# Patient Record
Sex: Female | Born: 1935 | Race: White | Hispanic: No | Smoking: Never smoker
Health system: Southern US, Community
[De-identification: ages and names within clinical notes are randomized; demographics above are authoritative.]

## PROBLEM LIST (undated history)

## (undated) DIAGNOSIS — R0602 Shortness of breath: Secondary | ICD-10-CM

## (undated) DIAGNOSIS — J302 Other seasonal allergic rhinitis: Secondary | ICD-10-CM

## (undated) DIAGNOSIS — Q210 Ventricular septal defect: Secondary | ICD-10-CM

## (undated) DIAGNOSIS — M179 Osteoarthritis of knee, unspecified: Secondary | ICD-10-CM

## (undated) DIAGNOSIS — E039 Hypothyroidism, unspecified: Secondary | ICD-10-CM

## (undated) DIAGNOSIS — C801 Malignant (primary) neoplasm, unspecified: Secondary | ICD-10-CM

## (undated) DIAGNOSIS — Z923 Personal history of irradiation: Secondary | ICD-10-CM

## (undated) DIAGNOSIS — I2510 Atherosclerotic heart disease of native coronary artery without angina pectoris: Secondary | ICD-10-CM

## (undated) DIAGNOSIS — I255 Ischemic cardiomyopathy: Secondary | ICD-10-CM

## (undated) DIAGNOSIS — M1710 Unilateral primary osteoarthritis, unspecified knee: Secondary | ICD-10-CM

## (undated) DIAGNOSIS — I2109 ST elevation (STEMI) myocardial infarction involving other coronary artery of anterior wall: Secondary | ICD-10-CM

## (undated) DIAGNOSIS — I10 Essential (primary) hypertension: Secondary | ICD-10-CM

## (undated) DIAGNOSIS — K649 Unspecified hemorrhoids: Secondary | ICD-10-CM

## (undated) HISTORY — DX: Malignant (primary) neoplasm, unspecified: C80.1

## (undated) HISTORY — PX: JOINT REPLACEMENT: SHX530

## (undated) HISTORY — DX: Hypothyroidism, unspecified: E03.9

## (undated) HISTORY — PX: TOTAL KNEE ARTHROPLASTY: SHX125

## (undated) HISTORY — DX: Ischemic cardiomyopathy: I25.5

## (undated) HISTORY — DX: Personal history of irradiation: Z92.3

## (undated) HISTORY — PX: EYE SURGERY: SHX253

## (undated) HISTORY — PX: CATARACT EXTRACTION, BILATERAL: SHX1313

## (undated) HISTORY — PX: HEMORRHOID SURGERY: SHX153

## (undated) HISTORY — PX: ABDOMINAL HYSTERECTOMY: SHX81

## (undated) HISTORY — PX: TONSILLECTOMY: SUR1361

---

## 1997-11-18 ENCOUNTER — Other Ambulatory Visit: Admission: RE | Admit: 1997-11-18 | Discharge: 1997-11-18 | Payer: Self-pay | Admitting: Gynecology

## 1999-07-25 ENCOUNTER — Other Ambulatory Visit: Admission: RE | Admit: 1999-07-25 | Discharge: 1999-07-25 | Payer: Self-pay | Admitting: Obstetrics and Gynecology

## 2000-04-19 ENCOUNTER — Encounter: Admission: RE | Admit: 2000-04-19 | Discharge: 2000-04-19 | Payer: Self-pay | Admitting: Internal Medicine

## 2000-04-19 ENCOUNTER — Encounter: Payer: Self-pay | Admitting: Internal Medicine

## 2000-08-27 ENCOUNTER — Ambulatory Visit (HOSPITAL_COMMUNITY): Admission: RE | Admit: 2000-08-27 | Discharge: 2000-08-27 | Payer: Self-pay | Admitting: *Deleted

## 2000-09-12 ENCOUNTER — Other Ambulatory Visit: Admission: RE | Admit: 2000-09-12 | Discharge: 2000-09-12 | Payer: Self-pay | Admitting: Gynecology

## 2001-12-12 ENCOUNTER — Other Ambulatory Visit: Admission: RE | Admit: 2001-12-12 | Discharge: 2001-12-12 | Payer: Self-pay | Admitting: Gynecology

## 2001-12-25 ENCOUNTER — Observation Stay (HOSPITAL_COMMUNITY): Admission: RE | Admit: 2001-12-25 | Discharge: 2001-12-26 | Payer: Self-pay | Admitting: Gynecology

## 2002-04-24 ENCOUNTER — Observation Stay (HOSPITAL_COMMUNITY): Admission: RE | Admit: 2002-04-24 | Discharge: 2002-04-25 | Payer: Self-pay | Admitting: Urology

## 2003-02-11 ENCOUNTER — Other Ambulatory Visit: Admission: RE | Admit: 2003-02-11 | Discharge: 2003-02-11 | Payer: Self-pay | Admitting: Gynecology

## 2004-05-05 ENCOUNTER — Encounter: Admission: RE | Admit: 2004-05-05 | Discharge: 2004-05-05 | Payer: Self-pay | Admitting: Specialist

## 2004-06-02 ENCOUNTER — Inpatient Hospital Stay (HOSPITAL_COMMUNITY): Admission: RE | Admit: 2004-06-02 | Discharge: 2004-06-08 | Payer: Self-pay | Admitting: Specialist

## 2004-06-02 ENCOUNTER — Ambulatory Visit: Payer: Self-pay | Admitting: Physical Medicine & Rehabilitation

## 2005-01-24 ENCOUNTER — Other Ambulatory Visit: Admission: RE | Admit: 2005-01-24 | Discharge: 2005-01-24 | Payer: Self-pay | Admitting: Gynecology

## 2008-02-11 ENCOUNTER — Encounter: Payer: Self-pay | Admitting: Gynecology

## 2008-02-11 ENCOUNTER — Ambulatory Visit: Payer: Self-pay | Admitting: Gynecology

## 2008-02-11 ENCOUNTER — Other Ambulatory Visit: Admission: RE | Admit: 2008-02-11 | Discharge: 2008-02-11 | Payer: Self-pay | Admitting: Gynecology

## 2009-03-17 ENCOUNTER — Encounter: Admission: RE | Admit: 2009-03-17 | Discharge: 2009-03-17 | Payer: Self-pay | Admitting: Specialist

## 2010-03-24 ENCOUNTER — Ambulatory Visit: Payer: Self-pay | Admitting: Gynecology

## 2010-10-18 ENCOUNTER — Inpatient Hospital Stay (HOSPITAL_COMMUNITY)
Admission: RE | Admit: 2010-10-18 | Discharge: 2010-10-24 | DRG: 470 | Disposition: A | Discharge status: Skilled Nursing Facility | Payer: Medicare HMO | Source: Ambulatory Visit | Attending: Orthopedic Surgery | Admitting: Orthopedic Surgery

## 2010-10-18 ENCOUNTER — Ambulatory Visit (HOSPITAL_COMMUNITY)
Admission: RE | Admit: 2010-10-18 | Discharge: 2010-10-18 | Disposition: A | Discharge status: Home / Self Care | Payer: Medicare HMO | Source: Ambulatory Visit | Attending: Orthopedic Surgery | Admitting: Orthopedic Surgery

## 2010-10-18 ENCOUNTER — Other Ambulatory Visit (HOSPITAL_COMMUNITY): Payer: Self-pay | Admitting: Orthopedic Surgery

## 2010-10-18 DIAGNOSIS — I10 Essential (primary) hypertension: Secondary | ICD-10-CM | POA: Diagnosis present

## 2010-10-18 DIAGNOSIS — M1710 Unilateral primary osteoarthritis, unspecified knee: Principal | ICD-10-CM | POA: Diagnosis present

## 2010-10-18 DIAGNOSIS — Z01811 Encounter for preprocedural respiratory examination: Secondary | ICD-10-CM

## 2010-10-18 LAB — DIFFERENTIAL
Basophils Absolute: 0.1 10*3/uL (ref 0.0–0.1)
Basophils Relative: 1 % (ref 0–1)
Eosinophils Relative: 4 % (ref 0–5)
Lymphocytes Relative: 19 % (ref 12–46)
Neutro Abs: 6.6 10*3/uL (ref 1.7–7.7)

## 2010-10-18 LAB — BASIC METABOLIC PANEL
BUN: 19 mg/dL (ref 6–23)
Calcium: 9.6 mg/dL (ref 8.4–10.5)
Creatinine, Ser: 0.82 mg/dL (ref 0.4–1.2)
GFR calc non Af Amer: >60 mL/min (ref >60)
Glucose, Bld: 99 mg/dL (ref 70–99)
Potassium: 3.8 mEq/L (ref 3.5–5.1)

## 2010-10-18 LAB — CBC
HCT: 40.1 % (ref 36.0–46.0)
Platelets: 292 10*3/uL (ref 150–400)
RDW: 13.9 % (ref 11.5–15.5)
WBC: 9.6 10*3/uL (ref 4.0–10.5)

## 2010-10-18 LAB — PROTIME-INR: Prothrombin Time: 13.2 seconds (ref 11.6–15.2)

## 2010-10-18 LAB — TYPE AND SCREEN
ABO/RH(D): A POS
Antibody Screen: NEGATIVE

## 2010-10-19 LAB — BASIC METABOLIC PANEL
BUN: 13 mg/dL (ref 6–23)
CO2: 28 mEq/L (ref 19–32)
Chloride: 103 mEq/L (ref 96–112)
Creatinine, Ser: 0.73 mg/dL (ref 0.4–1.2)
Potassium: 3.6 mEq/L (ref 3.5–5.1)

## 2010-10-19 LAB — CBC
HCT: 34.1 % — ABNORMAL LOW (ref 36.0–46.0)
Hemoglobin: 11.3 g/dL — ABNORMAL LOW (ref 12.0–15.0)
MCH: 27.7 pg (ref 26.0–34.0)
MCV: 83.6 fL (ref 78.0–100.0)
RBC: 4.08 MIL/uL (ref 3.87–5.11)
WBC: 11 10*3/uL — ABNORMAL HIGH (ref 4.0–10.5)

## 2010-10-20 LAB — CBC
HCT: 34.1 % — ABNORMAL LOW (ref 36.0–46.0)
MCH: 27.5 pg (ref 26.0–34.0)
MCV: 82.2 fL (ref 78.0–100.0)
Platelets: 203 10*3/uL (ref 150–400)
RDW: 13.9 % (ref 11.5–15.5)
WBC: 12.8 10*3/uL — ABNORMAL HIGH (ref 4.0–10.5)

## 2010-10-20 LAB — BASIC METABOLIC PANEL
BUN: 10 mg/dL (ref 6–23)
Chloride: 97 mEq/L (ref 96–112)
Creatinine, Ser: 0.72 mg/dL (ref 0.4–1.2)
GFR calc non Af Amer: >60 mL/min (ref >60)
Glucose, Bld: 142 mg/dL — ABNORMAL HIGH (ref 70–99)
Potassium: 3.4 mEq/L — ABNORMAL LOW (ref 3.5–5.1)

## 2010-10-20 LAB — URINALYSIS, ROUTINE W REFLEX MICROSCOPIC
Bilirubin Urine: NEGATIVE
Glucose, UA: NEGATIVE mg/dL
Ketones, ur: NEGATIVE mg/dL
Protein, ur: NEGATIVE mg/dL
pH: 6 (ref 5.0–8.0)

## 2010-10-21 LAB — URINE CULTURE
Colony Count: NO GROWTH
Culture  Setup Time: 201205180228

## 2010-10-21 NOTE — H&P (Signed)
[name], PLA               ACCOUNT NO.:  [id number]   MEDICAL RECORD NO.:  [id number]          PATIENT TYPE:  INP   LOCATION:  NA                           FACILITY:  [facility]   PHYSICIAN:  Erasmo Leventhal, M.D.DATE OF BIRTH:  Feb 17, 1925   DATE OF ADMISSION:  DATE OF DISCHARGE:                                HISTORY & PHYSICAL   SCHEDULED DATE OF SURGERY:  June 02, 2004   CHIEF COMPLAINT:  Left knee end-stage osteoarthritis.   HISTORY OF PRESENT ILLNESS:  This is a 77 year old female well known to [name]  who has been treated conservatively for a left knee osteoarthritis.  She has  now failed conservative management to control her pain.  After discussion of  treatment options, risks, benefits, and after-care, the patient is to  proceed with total knee arthroplasty of her left knee.  Her medical doctor  is Dr. Higinio Plan and she has been cleared for surgery by him.  The  surgery will go ahead as scheduled.   PAST MEDICAL HISTORY:  1.  Drug allergies: SYNVISC.  2.  Current medications:      1.  Lisinopril one q.a.m.      2.  Vicodin p.r.n.  3.  Previous surgery:  Bladder tack.  4.  Serious medical illnesses:  Hypertension.   FAMILY HISTORY:  Negative.   SOCIAL HISTORY:  The patient is married.  She is retired.  She lives at home  with her husband.  She does not smoke or drink.   REVIEW OF SYMPTOMS:  NEUROLOGIC:  Negative for headache, blurry vision, and  dizziness.  PULMONARY:  Negative for shortness of breath, PND, orthopnea.  CARDIOVASCULAR:  Positive for hypertension.  Negative for chest pain,  palpitations.  GI:  Negative for ulcers or hepatitis.  GU:  Positive for  history of cystitis.  MUSCULOSKELETAL:  Positive as in HPI.   PHYSICAL EXAMINATION:  VITAL SIGNS:  Blood pressure 148/62, respirations 16,  pulse 68 and regular.  GENERAL APPEARANCE:  Well-developed, well-nourished lady in no acute  distress.  HEENT:  Head normocephalic, nose patent, nares  patent.  Pupils equally  round, reactive to light.  Throat without injection.  NECK:  Supple without adenopathy.  Carotids 2+ without bruit.  CHEST:  Clear to auscultation, no rales or rhonchi, respirations 16.  HEART:  Regular rate and rhythm at 68 beats per minute without murmur.  ABDOMEN:  Soft with active bowel sounds, no mass or organomegaly.  NEUROLOGIC:  The patient alert and oriented to time, place, and person.  Cranial nerves 2-13 grossly intact.  EXTREMITIES:  Shows the left knee with a 5 degree flexion contracture with  further flexion to 95 degrees.  Sensation and circulation are intact.  Dorsalis pedis and posterior tibialis pulses are 1+.   X-rays show end-stage osteoarthritis left knee.   IMPRESSION:  End-stage osteoarthritis, left knee.   PLAN:  Total knee arthroplasty, left knee.      SJC/MEDQ  D:  05/27/2004  T:  05/27/2004  Job:  [id number]

## 2010-10-21 NOTE — Op Note (Signed)
[facility]  Patient:    [name], [name] Visit Number: [id number] MRN: [id number]          Service Type: DSU Location: 9300 [id number] Attending Physician:  Merrily Pew Dictated by:   Nadyne Coombes Fontaine, M.D. Proc. Date: 12/25/01 Admit Date:  12/25/2001 Discharge Date: 12/26/2001                             Operative Report  PREOPERATIVE DIAGNOSES:       Pelvic relaxation, cystocele.  PROCEDURE:                    Anterior repair.  SURGEON:                      Timothy P. Fontaine, M.D.  ASSISTANT:                    Douglass Rivers, M.D.  ANESTHESIA:                   Spinal.  ESTIMATED BLOOD LOSS:         Minimal.  COMPLICATIONS:                None.  SPECIMEN:                     None.  FINDINGS:                     Second degree cystocele, mild cuff prolapse with traction, rectocele, bimanual without gross masses or abnormalities.  PROCEDURE:                    The patient was taken to the operating room. Underwent spinal anesthesia.  Was placed in a dorsal lithotomy position. Received a perineal and vaginal preparation with Betadine solution.  Bladder emptied with in-and-out Foley catheterization.  The patient was draped in the usual fashion.  After assuring adequate anesthesia, the upper vaginal cuff was identified, a weighted speculum placed, and the upper cuff grasped with Allis forceps.  A transverse mucosal incision was made through sharp dissection. The vesicovaginal plane was developed in a linear fashion to a level below the urethral opening.  The dissection was then carried lateral in the vesicovaginal plane through sharp and blunt dissection.  The cystocele was then reduced through progressive embrocating interrupted 2-0 Vicryl sutures with good reduction of the cystocele.  The excess vaginal mucosa was then excised and subsequently the vaginal mucosa was reapproximated in a running interlocking stitch using 2-0  Vicryl suture incorporating the underlying tissues to obliterate the dead space.  A Foley catheter was placed.  Free flowing clear yellow urine was noted.  Vaginal packing was placed after removal of the speculum.  The patient was placed in a supine position and taken to the recovery room in good position having tolerated the procedure well. Dictated by:   Nadyne Coombes. Fontaine, M.D. Attending Physician:  Merrily Pew DD:  12/25/01 TD:  12/28/01 Job: [id number]

## 2010-10-21 NOTE — H&P (Signed)
[facility]  Patient:    [name], [name] Visit Number: [id number] MRN: [id number]          Service Type: DSU Location: 9300 [id number] Attending Physician:  Merrily Pew Dictated by:   Nadyne Coombes Fontaine, M.D. Admit Date:  12/25/2001                           History and Physical  CHIEF COMPLAINT:              Cystocele.  HISTORY OF PRESENT ILLNESS:   The patient is a 77 year old G3, P3 female, status post TVH, anterior repair 20 years ago who presents with worsening cystocele symptoms.  The patient has been followed expectantly in the past but notes that she now feels something hanging down during the day as well as interfering with intercourse, and it is becoming very uncomfortable and wants this repaired.  She has no stress urinary incontinence issues and no voiding problems.  On exam the rectovaginal septum is weakened but no frank rectocele, and the vaginal cuff appears well supported.  PAST MEDICAL HISTORY:         Uncomplicated.  PAST SURGICAL HISTORY:        TVH and anterior repair.  CURRENT MEDICATIONS:          None.  ALLERGIES:                    None.  REVIEW OF SYSTEMS:            Noncontributory.  SOCIAL HISTORY:               Noncontributory.  PHYSICAL EXAMINATION:  VITAL SIGNS:                  Afebrile, vital signs are stable.  HEENT:                        Normal.  LUNGS:                        Clear.  CARDIAC:                      Regular rate.  Without rubs, murmurs, or gallops.  ABDOMEN:                      Benign.  PELVIC:                       BUS, vagina with second-degree cystocele.  Cuff well supported.  RECTOVAGINAL:                 Mild weakness in the rectovaginal septum.  No frank rectocele.  Bimanual without masses or tenderness.  ASSESSMENT:                   The patient is a 77 year old G3 P3 female, status post TVH and anterior repair 20 years ago with recurrent cystocele which is  becoming intolerable to her.  Options for conservative versus surgical intervention were reviewed, and the patient desires surgery.  The patient has seen her medical physician and has received medical clearance for the procedure.  The patient recently has been on estrogen replacement therapy which has been discontinued.  The expected intraoperative, postoperative courses as well as the expected recovery period were reviewed  with the patient and her husband.  The immediate risks, both intraoperative and postoperative, were discussed.  I reviewed with them that there are no guarantees as far as the surgery is concerned.  She may have a recurrent cystocele at some point in the future, and she understands and accepts this.  I also reviewed with he   r the issues as far as having some rectovaginal weakness but no frank rectocele and that we will plan on doing the most conservative procedure at present to include just the cystocele repair as her bladder neck appears well supported and she is not having any stress urinary incontinence symptoms.  I discussed the risks of infection requiring prolonged antibiotics or opening and draining of abscess formations.  I discussed bleeding leading to hemorrhage necessitating transfusion.  I discussed the risks of transfusion to include transfusion reaction, hepatitis, HIV, all of which were understood and accepted.  The risks of inadvertent injury to surrounding internal organs to include vagina, bladder, ureters, intestines, rectum necessitating major exploratory reparative surgeries and future reparative surgeries including ostomy formation were all discussed, understood, and accepted.  Sexuality following the repair was discussed, and I reviewed with them that it will be uncomfortable initially and that this should resolve over time, although no guarantees were made, and she understands that there may be persistent dyspareunia following the procedure.  I  also reviewed with her the issue with the repair as far as how tight to make the repair versus loose, and we both agree on erring on a tighter repair to prevent a recurrent cystocele in the future.  Lastly, I discussed with her that she has had prior surgery in this area, and there is a realistic risk for injury, particularly to the bladder, and she understands and accepts this potential for prolonged catheterization if, indeed, an injury recurs, and this was reviewed with her.  I also discussed that although she has no incontinence symptoms at this time and does not have a gross cystocele that would be felt to give a paradoxical continence and such that a repair would lead to incontinence, but I did discuss with her that there may be voiding difficulties following the procedure both from difficulty as far as voiding and/or incontinence symptoms and, again, she understands and accepts this.  I reviewed the potential for prophylactic bladder support but, given the lack of symptoms and good support noted on exam, we feel that this is unnecessary at this time.  The patient understands the issues.  She agrees with the procedure and is ready to proceed with this. She has an appointment to see anesthesia and will discuss various techniques with them to include a regional versus a general approach. Dictated by:   Nadyne Coombes. Fontaine, M.D. Attending Physician:  Merrily Pew DD:  12/23/01 TD:  12/25/01 Job: [id number]

## 2010-10-21 NOTE — Discharge Summary (Signed)
[name], [name]               ACCOUNT NO.:  [id number]   MEDICAL RECORD NO.:  [id number]          PATIENT TYPE:  INP   LOCATION:  0469                         FACILITY:  [facility]   PHYSICIAN:  Erasmo Leventhal, M.D.DATE OF BIRTH:  05-06-1925   DATE OF ADMISSION:  06/02/2004  DATE OF DISCHARGE:  06/08/2004                                 DISCHARGE SUMMARY   ADMISSION DIAGNOSIS:  End-stage osteoarthritis, left knee.   DISCHARGE DIAGNOSIS:  End-stage osteoarthritis, left knee.   OPERATION:  Total knee arthroplasty, left knee.   BRIEF HISTORY AND PHYSICAL:  This is a 77 year old female well known to [name],  treated conservatively for left knee osteoarthritis.  She failed  conservative management to control her pain, and after discussion of  treatment options, risks, benefits and after care, the patient is scheduled  for total knee arthroplasty.   LABORATORY VALUES:  Admission CBC within normal limits.  Her hemoglobin and  hematocrit reached a low of 9.2 and 26.5 on the June 06, 2003, and on  June 08, 2003, was back up to 9.4 and 26.8.  Admission PT/PTT within  normal limits.  At discharge, her PT was 18.7 with an INR of 1.9.  Admission  CMET showed the glucose high at 155.  Otherwise within normal limits.  She  ran elevated glucose through her hospitalization at 194, 140 and 104.  She  had mild hyponatremia which was corrected and a mild hypokalemia on June 07, 2004 at 3.3.   COURSE IN THE HOSPITAL:  The patient tolerated the operative procedure well.  A medical consult was obtained with [facility] for management  of her hypertension and hyperglycemia.  First postoperative day, she is  doing well.  Vital signs were stable.  She was afebrile.  Her Hemovac was DC  without difficulty.  Sensation and circulation were intact.  Calves were  negative, and she was started on physical therapy and CPM.  Second  postoperative day, she continued to do well with  temperatures to 100.6 max.  INR slowly increasing.  Dressing was changed.  Wound was benign.  Calves  were negative, and the epidural catheter was pulled.  Third postoperative  day, the patient continued to do well.  Pain was well controlled.  She  states she was feeling better that day.  She had been bed to chair, and  continuing her CPM, and discharge planning was initiated.  On June 06, 2004, she was doing well with no complaints.  Vital signs were stable.  She  was afebrile.  Dressing was changed.  Wound was benign.  Calves were  negative, and rehabilitation continued.  On June 07, 2004, she was feeling  a little better.  Her vital signs were stable and afebrile.  Hemoglobin and  hematocrit were stable.  She had a slight hyponatremia, slight hypokalemia.  Calves were negative.  Wound was mildly red.  No drainage.  No effusion.  Lungs were clear.  Bowel sounds were active.  She was placed on Keflex 500  q.i.d. as a prophylaxis for wound infection.  She had not  had full therapy  yet so she was kept another day in order to continue with therapy, and on  June 08, 2004, feeling good with vital signs stable and afebrile.  PT 18.7  with an INR of 1.9.  Lungs clear.  Heart sounds normal.  Wound benign.  The  patient was subsequently discharged home with a followup in the office.   CONDITION ON DISCHARGE:  Improved.   DISCHARGE MEDICATIONS:  1.  Percocet 5/325 1-2 q.4-6h. p.r.n. pain.  2.  Robaxin 500 1 p.o. q.8h. p.r.n. spasm.  3.  Keflex 500 mg 1 p.o. q.i.d.  4.  Trinsicon 1 b.i.d.  5.  Coumadin per pharmacy protocol.   DISCHARGE INSTRUCTIONS:  Follow up in the office in 10 days.  She is do here  home physical therapy, take her medications as directed, and call if any  problems or questions arise.      SJC/MEDQ  D:  06/24/2004  T:  06/24/2004  Job:  [id number]

## 2010-10-21 NOTE — Op Note (Signed)
NAME:  [name], [name]                         ACCOUNT NO.:  [id number]   MEDICAL RECORD NO.:  [id number]                   PATIENT TYPE:  AMB   LOCATION:  DAY                                  FACILITY:  [facility]   PHYSICIAN:  Sigmund I. Patsi Sears, M.D.         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   OPERATION:  1. Urethrolysis.  2. SPARC pubovaginal sling.  3. Anterior vaginal vault repair with Vicryl mesh and Tutoplast fascia.   SURGEON:  Sigmund I. Patsi Sears, M.D.   ANESTHESIA:  Spinal.   PREPARATION:  After appropriate preanesthesia, the patient was brought to  the operating room, placed on the operating table in dorsal supine position  where she received a spinal anesthetic. She was then placed in dorsal  lithotomy position where the pubis was prepped with Betadine solution and  draped in the usual fashion.   DESCRIPTION OF PROCEDURE:  Vaginal inspection revealed the patient has a  grade 3 cystocele, with hyperangulation from previous repair. She is noted  to have repair several months ago, with post repair, urinary frequency,  urgency, poor stream and 117 cc post void residual. She is now for repeat  repair. Approximately 17 cc of Marcaine with epinephrine 0.5% with 1:200,000  epinephrine was injected into the pubovaginal cervical arch tissue. An  incision was then made, first along the level of the bladder neck and  urethra, and second along the level of the base of the bladder. The two  incisions were separated by approximately 0.5 cm.  The subcutaneous tissue was dissected around the urethra bilaterally, and  urethrolysis was accomplished with scarring noted around the level of the  urethra. Two separate  incisions were then made above the pubic tubercle, two  fingerbreadths lateralward, and the SPARC needles placed. Cystoscopy  revealed no needles within the bladder, the [facility] sling was placed in  standard fashion, with drainage of the bladder.  The [facility] slings were then  cut and the plastic sleeve removed, with a right angle clamp behind the  urethra to ensure that the sling was not pulled too tight. The sling was  then cut underneath the level of the skin. Some bleeding was noted which  appeared to be superficial, but did not stop with cauterization. Because of  the patient's advanced age and post menopausal status and thinned vaginal  epithelium, it was elected to place a 2 cm x 7 cm portion of Tutoplast  fascia to reinforce the repair. This was accomplished by placing the ends up  toward the retropubic space bilaterally, with good bleeding control. The  wound was then closed in two layers with 3-0 Vicryl suture.  The second  incision was  then dissected, the bladder was dissected using a sponge for  blunt dissection. A 4 cm x 6 cm portion of Vicryl mesh was then cut to size  and placed in the wound. A horizontal mattress suture was then used to  affect an anterior vaginal vault repair. A 2 cm x 7 cm portion of Tutoplast  was then used to cover this, and the wound was closed in two layers with 3-0  Vicryl suture.  There was no bleeding noted from this portion of the surgery. Estrogen  encoded packing was then placed, and the suprapubic wounds were closed with  Steri-Strips. The Foley catheter was left to straight drainage, and the  patient was awakened and taken to the recovery room in good condition.                                               Sigmund I. Patsi Sears, M.D.    SIT/MEDQ  D:  04/24/2002  T:  04/24/2002  Job:  [id number]

## 2010-10-21 NOTE — Procedures (Signed)
[facility]  Patient:    [name], [name]                      MRN: [id number] Proc. Date: 08/27/00 Adm. Date:  [id number] Attending:  Sabino Gasser                           Procedure Report  PROCEDURE:  Colonoscopy.  INDICATION FOR PROCEDURE:  Rectal bleeding.  ANESTHESIA:  Demerol 70, Versed 8 mg.  DESCRIPTION OF PROCEDURE:  With the patient mildly sedated in the left lateral decubitus position, the Olympus videoscopic colonoscope was inserted in the rectum and passed under direct vision to the cecum identified by the ileocecal valve and appendiceal orifice both of which were photographed. We entered into the terminal ileum which also appeared normal and was photographed. From this point, the colonoscope was slowly withdrawn taking circumferential views of the entire colonic mucosa, stopping only in the rectum which appeared normal in direct and retroflexed view and through pull-through through the anal canal. The patients vital signs and pulse oximeter remained stable. The patient tolerated the procedure well without apparent complications.  FINDINGS:  Sigmoid colon with diverticulosis otherwise an unremarkable colonoscopic examination including the terminal ileum. Etiology of the patients rectal bleeding unclear but no significant pathology noted at this point. May have been a fissure. The patient had no rectal bleeding with this prep. Will have the patient follow-up with me on an as needed basis. DD:  08/27/00 TD:  08/28/00 Job: [id number]

## 2010-10-21 NOTE — Op Note (Signed)
NAMEDAWN, CONVERY               ACCOUNT NO.:  [id number]   MEDICAL RECORD NO.:  [id number]          PATIENT TYPE:  INP   LOCATION:  0003                         FACILITY:  [facility]   PHYSICIAN:  Erasmo Leventhal, M.D.DATE OF BIRTH:  [birth year]   DATE OF PROCEDURE:  06/02/2004  DATE OF DISCHARGE:                                 OPERATIVE REPORT   PREOPERATIVE DIAGNOSIS:  Left knee end stage osteoarthritis.   POSTOPERATIVE DIAGNOSIS:  Left knee end stage osteoarthritis.   OPERATION PERFORMED:  Left total knee arthroplasty.   SURGEON:  Erasmo Leventhal, M.D.   ASSISTANT:  Jaquelyn Bitter. Chabon, P.A.   ANESTHESIA:  Regional.   ESTIMATED BLOOD LOSS:  Less than 100 mL.   DRAINS:  Two medium Hemovacs.   COMPLICATIONS:  None.   TOURNIQUET TIME:  One hour and 30 minutes at 350 mmHg.   DISPOSITION:  To post anesthesia care unit stable.   OPERATIVE IMPLANTS:  Stryker Howmed[employer] implants, size 7 femur, size 7 tibia,  10 mm posterior stabilized Flex tibial insert with a 26 mm patella, all  cemented.   DESCRIPTION OF PROCEDURE:  Patient counseled in the holding area, correct  side was identified.  Chart was reviewed and signed appropriately.  Taken to  operating room and placed in supine position.  IV antibiotics were given.  Spinal epidural was administered.  Foley catheter placed utilizing sterile  technique by the operating room circulator nurse.  Left knee was well  padded, all extremities well padded and bumped.  Left knee examined.  She  had about a 3 to 5 degree flexion contracture.  She could flex 125 degrees.  She was elevated, prepped with DuraPrep and _________ draped in sterile  fashion.  Exsanguinated with Esmarch.  Tourniquet was inflated to 350 mmHg.  Standard straight midline incision made through the skin and subcutaneous  tissues.  Small veins electrocoagulated.  Medial patellar arthrotomy was  performed.  Proximally, a soft tissue release was done.  Patella  was  everted, knee was flexed.  End stage arthritic change bone-against-bone.  Cruciate ligaments were resected.  Synovectomy performed with a rongeur.  There was no evidence of active infection but there was quite a bit of  synovitis present.  Starter hole made in the distal femoral canal.  Irrigated.  Effluent was clear.  Intramedullary rod was gently placed.  We  chose a 5 degree valgus cut with a 10 mm cut off the distal femur.  The  distal femur was found to be a size 7.  Rotation marks were made.  Distal  femur was cut to fit a size 7.  Medial and lateral menisci removed under  direct visualization.  ________ coagulated.  Tibial eminence was resected.  Again, cruciate ligaments had been resected.  Posterior neurovascular  structures were tied off and protected throughout the entire case.  The  proximal tibia found to be a size 7.  Starter hole was made.  _________  utilized.  Canal irrigated.  Effluent was clear.  Intramedullary rod was  gently placed.  We chose a 0 degree slope with a  2 mm cut based up on the  lateral side.  Posteromedial and posterolateral femoral osteophytes were  removed under direct visualization.  Femoral trochlea was prepared in  standard fashion.  At this time a size 7 femur, size 7 tibia, 10 mm Flex  insert with excellent range of motion, soft tissue balance.  Rotational  marks were made along with excellent Delta keel _________ fashion.  Patella  found to be 22 mm thick.  It was reamed appropriate depth for a size 26 and  then locking holes were made.  At this time the knee was irrigated with  pulsatile lavage as cement was mixed.  We utilized ________ cement  technique.  All components were cemented into place, size 7 tibial, size 7  femur and 26 patella.  After cement had cured, I did a 10 and 12 mm trials  and with a 10 mm trial we had excellent range of motion.  Soft tissue  balance in flexion and extension.  Gaps were well balances.  Soft tissue was   released laterally.  _________ tracking was anatomic, did not require  lateral release.  Wounds were copiously irrigated.  Two Hemovac drains were  placed.  We also put in the final 10 mm Flex tibial component.  Bone wax was  applied to exposed bony surfaces.  Soft tissue knee joint irrigated each  layer during the closure with sterile saline.  Sequential closure with  layers done.  Arthrotomy Vicryl. Subcu Vicryl.  Skin closed with ________  Monocryl suture.  Sterile compressive dressing applied to the knee.  Tourniquet deflated.  1 g Ancef given intravenously.  Sterile compressive  dressing applied.  Sponge and needle counts were correct at the end of this  case.  There were no complications.  She had excellent circulation to foot  and ankle end of case.  Ice pack was applied.  Knee immobilizer, full  extension.  There were no complications.  Sponge and needle counts were  correct.  The patient tolerated the procedure well.  She was taken from the  operating room and PACU in stable condition.   To decrease surgical time throughout the entire case, Mr. Jeannett Senior Chabon's  assistance was needed.      RAC/MEDQ  D:  06/02/2004  T:  06/02/2004  Job:  [id number]

## 2010-10-21 NOTE — Consult Note (Signed)
[name], [name]               ACCOUNT NO.:  [id number]   MEDICAL RECORD NO.:  [id number]          PATIENT TYPE:  INP   LOCATION:  0469                         FACILITY:  [facility]   PHYSICIAN:  Gertha Calkin, M.D.DATE OF BIRTH:  1925/01/15   DATE OF CONSULTATION:  06/03/2004  DATE OF DISCHARGE:                                   CONSULTATION   REQUESTING PHYSICIAN:  Dr. Thomasena Edis   PRIMARY CARE PHYSICIAN:  Dr. Lendell Caprice   This is regarding medical management.   HISTORY OF PRESENT ILLNESS:  This is a pleasant 77 year old white female  with past medical history significant for hypertension, osteoarthritis, with  a history of incontinence, status post distant history of surgery, who had  been admitted for surgical management of left knee severe osteoarthritis.  She had her left knee arthroplasty, total knee replacement yesterday  afternoon.  There were no significant or immediate complications following  surgery.  She states that there are no other joints affected.  She states  she had been on Vicodin pain medications at home for quite some time.  Currently, she denies any chest pain, shortness of breath, dyspnea on  exertion.  No issues with reflux, fevers, chills, no congestion, no  headaches, blurred vision.  No difficulty swallowing.  She has her appetite  back already.  Otherwise, no other problems.   PAST MEDICAL HISTORY:  1.  Severe osteoarthritis of the left knee.  2.  Hypertension.  3.  History of incontinence, status post surgery in 2004.  This is a bladder      tack surgery.  4.  She also recently approximately 3-4 months ago had an outbreak of      shingles on the right abdomen area.   MEDICATIONS:  1.  Lisinopril/HCTZ 20/12.5 mg daily.  2.  Hydrocodone 1-2 tabs q.4-6h.  3.  Baby aspirin 81 mg p.o. daily.  This was stopped on the 18th prior to      surgery.   SOCIAL HISTORY:  She is married, lives in [place], has three kids with no  medical issues.  She  denies alcohol, tobacco, or illicit drug use.   FAMILY HISTORY:  Positive for hypertension, diabetes in her mother.  She has  two brothers, one of whom has diabetes and other medical problems that she  is unaware of.  No known history of MI, strokes, or DVTs in the family.  No  family history of cholesterol issues, kidney abnormalities.   REVIEW OF SYSTEMS:  Essentially per HPI.  Also she states that she still has  issues with urge incontinence but does not require a diaper.  Mostly, this  is urge incontinence is how she describes it.  Denies any recent weight  losses or weight change.  No blurred vision, difficulty swallowing.  No  reflux symptoms.  Denies any problems with pain except for her presenting  symptom.  No headaches.  No hemoptysis, no problems with constipation or  diarrhea.   PHYSICAL EXAMINATION:  VITAL SIGNS:  Temperature 98.9, pulse 96, blood  pressure 131/43, respirations are 18 at 90% on 2 L nasal cannula.  HEENT:  Unremarkable.  Pupils equal, round, and reactive to light and  accommodation.  NECK:  Supple without masses.  No lymphadenopathy.  No JVP.  CHEST:  Clear to auscultation bilaterally with good air movement.  CARDIAC:  Regular rate and rhythm, no murmurs, rubs, or gallops.  ABDOMEN:  Soft, nontender, nondistended.  No bowel sounds present.  EXTREMITIES:  Without cyanosis, clubbing, or edema.  Her left knee is  mobilized currently.  She does have TED stockings in both lower extremities.  SKIN:  No rashes or lesions present.  NEUROLOGIC:  Cranial nerves 2-12 are intact.  No sensation deficits or motor  deficits.   LABORATORY DATA:  Her PT this morning was 13.5, INR 1.0, sodium 134,  potassium 4.2, chloride 103, bicarb 26, glucose 194, BUN 10, creatinine 1.0,  calcium 8.5.  Her white count is 14.2, hemoglobin 10.8, platelets 305, MCV  86.   Admission knee x-ray of left knee showed moderate medial and compartment  degenerative disease but no acute bony  findings.  Chest x-ray on admission  showed mild changes of COPD but no acute cardiopulmonary findings.  Her  cardiac silhouette was within normal limits.  I do not see any EKGs that  were done.   ASSESSMENT:  1.  Severe left knee osteoarthritis, status post arthroplasty, total knee      replacement postop day #1.  2.  Hypertension.  3.  Urge/stress incontinence, status post bladder tack surgery.  4.  Deep venous thrombosis gastrointestinal prophylaxis.   PLAN:  Recommendations include continue blood pressure management as being  handled currently.  Would not add any medications or pursue aggressive blood  pressure control unless systolic becomes greater than 161-096.  Given her  pain and recent surgery, would expect her blood pressure to remain mild,  however, currently is well-controlled on her medications.  Also expect  obviously that with any episode of pain or therapy which would also increase  her pain, would increase her blood pressure but again, would not be worried  with acute management at this time.  She does have a history of well-  controlled hypertension on her home medications which she was admitted with.  Currently would make sure she is on a proton pump inhibitor and subcu  Lovenox for deep venous thrombosis prophylaxis and reflux prophylaxis.  Also, we will write for Colace as a scheduled medication until her bowel  movements return and are consistent to prevent any complications.  We will  be overseen by orthopedics.  Regarding her hemoglobin, she does have a drop  in her hemoglobin from her admission H&H, but would not pursue any  aggressive work-up at this time.  Would follow her CBC and determine whether  this was postop change.  She is hyperglycemic on this morning's BMET but,  again, would follow this and would not treat aggressively.  If she  remains hypoglycemic throughout her stay would determine if she does have insulin resistance.  Otherwise, currently no  acute medical issues to follow  but will be available.  I appreciate the consultation.  [facility] will be  available for rounding purposes.      JD/MEDQ  D:  06/03/2004  T:  06/03/2004  Job:  [id number]   cc:   Erasmo Leventhal, M.D.  [facility]  [address]  [address]  [place]  [place] [address]  Fax: [phone]   Dr. Lendell Caprice

## 2010-10-22 ENCOUNTER — Inpatient Hospital Stay: Admit: 2010-10-22 | Payer: Self-pay

## 2010-10-24 NOTE — Discharge Summary (Signed)
[name], [name]               ACCOUNT NO.:  [id number]  MEDICAL RECORD NO.:  [id number]           PATIENT TYPE:  I  LOCATION:  1539                         FACILITY:  [facility]  PHYSICIAN:  Madlyn Frankel. Charlann Boxer, M.D.  DATE OF BIRTH:  April 11, 1925  DATE OF ADMISSION:  10/18/2010 DATE OF DISCHARGE:  10/21/2010                        DISCHARGE SUMMARY - REFERRING   ADMITTING DIAGNOSIS:  Right knee osteoarthritis.  DISCHARGE DIAGNOSES: 1. Right knee osteoarthritis with a right total-knee replacement     performed on Oct 18, 2010. 2. Hypertension.  ADMITTING HISTORY:  [name] is a pleasant 77 year old female who has been tolerating advanced right knee osteoarthritis for a number of years.  She has been trying to help manage and take care of her husband. Her quality of life has started to significantly be affected by the amount of pain in her right knee.  She was hoping to be considered for partial-knee replacement versus total-knee replacement due to the time for recovery.  After some consideration and consultation she was felt not to be a candidate for a partial-knee replacement and instead needed to have a total-knee replacement performed.  The risks and benefits were reviewed and discussed for this, as she had been through a left total-knee replacement in the past.  HOSPITAL COURSE:  The patient was admitted for same-day surgery on Oct 18, 2010.  She underwent an uncomplicated right total-knee replacement. Please see dictated operative note for the full details of the procedure.  Postoperatively she was transferred to the recovery room, where she remained for a routine amount of time without complication and was subsequently transferred to the floor.  She was seen and evaluated by Physical Therapy on postoperative day #1.  Her mobility was somewhat limited.  Due to social issues and no one at home to help manage her, she wished to be transferred to a skilled nursing facility.   Social work consult was made.  Her Foley catheter and Hemovac drain were removed on postoperative day #1.  She was placed on a regular diet.  In the hospital her laboratories remained stable, noting a hematocrit on postoperative day #2 of 34.1 without transfusion and normal electrolytes.  Due to a slightly elevated white blood cell count of 12.8 with some urinary frequency, a UA was ordered and was found to be negative.  She was not placed on any antibiotics for this.  Her dressing was removed and her wound was found to be clean and dry.  By postoperative day #3, she was medically stable for transfer to a nursing facility to continue working on rehabilitation, preferably the [facility] where her husband currently is residing.  CONDITION ON DISCHARGE:  She is stable at the time of discharge.  DISCHARGE INSTRUCTIONS:  She will be discharged to a nursing facility to work on rehabilitation for strengthening, range of motion and gait training and balance to work on returning to independence.  She will return to see Dr. [name] at [facility] at [phone] in two weeks' time.  She will maintain her current Aquacel dressing on her right knee until Oct 26, 2010, at which point  it can be removed and a new dressing and gauze and tape applied.  This current dressing is waterproof.  DISCHARGE MEDICATIONS:  Her discharge medications will include: 1. Colace 100 mg p.o. b.i.d. while on pain medicines for constipation. 2. Aspirin 325 mg p.o. b.i.d. for 30 days. 3. Iron 325 mg p.o. b.i.d. for two weeks and then off. 4. Robaxin 500 mg p.o. q.6 hours as needed for muscle spasm and pain. 5. Oxycodone 5-10 mg every 4-6 hours as needed for pain. 6. MiraLax 17 grams p.o. daily as needed for constipation while on     pain medicine. 7. Tylenol 1000 mg every 8 hours as needed while on oxycodone for     pain. 8. Carvedilol 3.125 mg b.i.d. 9. Hydrochlorothiazide 12.5 mg p.o.  q.a.m.  Any orthopedic questions can be addressed to Dr. [name].  Any medical issues can be addressed to Dr. Sabino Gasser, her primary care physician.     Madlyn Frankel Charlann Boxer, M.D.     MDO/MEDQ  D:  10/21/2010  T:  10/21/2010  Job:  [id number]  cc:   Dr. Sabino Gasser  Electronically Signed by [name] M.D. on 10/24/2010 01:03:40 PM

## 2010-10-24 NOTE — Discharge Summary (Signed)
  NAMEDORETHY, [name]               ACCOUNT NO.:  [id number]  MEDICAL RECORD NO.:  [id number]           PATIENT TYPE:  I  LOCATION:  1539                         FACILITY:  [facility]  PHYSICIAN:  Madlyn Frankel. Charlann Boxer, M.D.  DATE OF BIRTH:  1924/11/15  DATE OF ADMISSION:  10/18/2010 DATE OF DISCHARGE:  10/24/2010                        DISCHARGE SUMMARY - REFERRING   ADDENDUM  HOSPITAL COURSE:  The hospital course since the initial discharge summary has been unchanged.  [name] had no complications over the weekend.  Her delay in discharge was related to placement into a skilled nursing facility.  She was started on a CPM machine on Friday, May 18 and tolerated this well.  Her wound was noted to be clean, dry, and intact without evidence of any drainage.  She did have a small blister medially, for which I lanced and dressed in the hospital.  She had no new laboratories since May 17.  Her vital signs remained stable.  DISCHARGE INSTRUCTIONS:  The patient is to be discharged to a skilled nursing facility today.  I would like for her to have a CPM machine at the facility starting at 0 to 60 degrees of motion, increasing 10 degrees a day as tolerated, utilizing the CPM 4 to 6 hours a day as tolerable.  FOLLOWUP:  Followup with Dr. [name] will be unchanged and will be two weeks from her surgery date.  MEDICATIONS:  No change in her medications since her previous discharge summary.  Discharge prescriptions are already on the chart.     Madlyn Frankel Charlann Boxer, M.D.     MDO/MEDQ  D:  10/24/2010  T:  10/24/2010  Job:  [id number]  Electronically Signed by [name] M.D. on 10/24/2010 01:03:43 PM

## 2010-10-24 NOTE — Op Note (Signed)
[name], [name]               ACCOUNT NO.:  [id number]  MEDICAL RECORD NO.:  [id number]           PATIENT TYPE:  I  LOCATION:  1539                         FACILITY:  [facility]  PHYSICIAN:  Madlyn Frankel. Charlann Boxer, M.D.  DATE OF BIRTH:  25-Jun-1924  DATE OF PROCEDURE: DATE OF DISCHARGE:                              OPERATIVE REPORT   PREOPERATIVE DIAGNOSIS:  Right knee osteoarthritis.  POSTOPERATIVE DIAGNOSIS:  Right knee osteoarthritis.  PROCEDURE:  Right total knee replacement.  COMPONENTS USED:  DePuy rotating platform posterior stabilized knee system with size 3 femur, 2.5 tibia, 12.5 insert and a 38 patellar button.  SURGEON:  Madlyn Frankel. Charlann Boxer, M.D.  ASSISTANT:  Leilani Able, PA-C.  ANESTHESIA:  Spinal.  COMPLICATIONS:  None.  TOURNIQUET TIME:  30 minutes at 250 mmHg.  DRAINS:  One Hemovac.  BLOOD LOSS:  Minimal blood loss.  INDICATIONS FOR PROCEDURE:  [name] is a very pleasant 77 year old female who presented for evaluation of right knee pain.  She had end- stage degenerative changes predominantly medially.  There was a question of whether or not she would be a candidate for partial knee replacement. I did not feel this would be an adequate treatment for this 77 year old female based on the advance degenerative change and minor joint subluxation.  After reviewing these with her, she wished to proceed in this fashion to try to improve her overall quality of life.  Risk of infection, DVT were reviewed.  She has gone through left total knee replacement and so she understood the postoperative course and expectations.  Consent was obtained.  PROCEDURE IN DETAIL:  The patient was brought to operative theater. Once adequate anesthesia, preoperative antibiotics, Ancef administered, she was positioned supine with the right thigh tourniquet placed.  The right lower extremity was then prepped and draped in sterile fashion with the right foot placed into DeMayo leg holder.   A time-out was performed identifying the patient, planned procedure and extremity.  Leg was exsanguinated, tourniquet elevated to 250 mmHg.  A midline incision was made followed by median arthrotomy.  Following initial exposure and debridement, attention was directed to the patella.  Precut measurement was approximately 23 mm.  I resected down to about 14 mm, then used a 38 patellar button to restore height as well as cover the cut surface adequately.  Lug holes were drilled and a metal shim placed to protect the patella from retractors and saw blades.  At this point, the attention was directed to femur.  Femoral canal was opened with a drill, irrigated to try to prevent fat emboli.  The intramedullary rod was then passed and at 3 degrees of valgus 11 mm of bone resected was off the distal femur due to the preoperative flexion contracture.  The tibia was then subluxated anterior.  Further meniscus and cruciate stumps were debrided as necessary.  Using extramedullary guide, a measured resection of 11 mm of the least affected lateral side wascarried out.  Following this cut, we confirmed the 10 mm spacer block would fit with stable medial and lateral collateral ligaments, as well as confirmed the cut was perpendicular in the  coronal plane using the alignment rod.  At this point, I sized the femur to be a size 3.  The size 3 rotation block was pinned into position, anterior referenced using the C clamp to set rotation off the proximal tibia cut.  The 4-in-1 cutting block was then pinned into position.  Anterior-posterior chamfer cuts were then made without difficulty, nor notching.  The tibia was subluxated again anteriorly.  I felt that the size 2.5 tibial tray fit best on this cut surface.  It was pinned into position, drilled and keel punched.  Trial reduction was now carried out.  At this point I determined the size 12.5 poly insert would give me the best stability from extension  to flexion with full knee extension.  Given this, the trial components were removed from the knee.  The knee was injected around the synovial capsule junction with 0.25% Marcaine with epinephrine with 1 cc of Toradol, total of 61 cc.  The knee was irrigated with pulse lavage normal saline solution.  Final components were opened, cement mixed.  Final components were then cemented into position with the knee in extension with the 12.5 insert.  Extruded cement was removed.  Once the cement had fully cured and excessive cement was removed throughout the knee, the final 12.5 insert was chosen and placed in the knee.  The tourniquet had been let down at 30 minutes without significant hemostasis required.  The knee was reirrigated and medium Hemovac drain was placed deep.  The extensor mechanism was then reapproximated using #1 Vicryl with the knee in flexion.  The remainder of the wound was closed with 2-0 Vicryl and running 4-0 Monocryl.  The knee was cleaned, dried and dressed sterilely with Dermabond and Aquacel dressing, the drain site dressed separately.  She was then brought to recovery room in stable condition tolerating the procedure well.     Madlyn Frankel Charlann Boxer, M.D.     MDO/MEDQ  D:  10/19/2010  T:  10/19/2010  Job:  [id number]  Electronically Signed by [name] M.D. on 10/24/2010 01:03:34 PM

## 2010-11-06 NOTE — H&P (Signed)
  NAMEZALIKA, [name]               ACCOUNT NO.:  [id number]  MEDICAL RECORD NO.:  [id number]           PATIENT TYPE:  LOCATION:                                 FACILITY:  PHYSICIAN:  Madlyn Frankel. Charlann Boxer, M.D.  DATE OF BIRTH:  Oct 13, 1924  DATE OF ADMISSION: DATE OF DISCHARGE:                             HISTORY & PHYSICAL   CHIEF COMPLAINT:  Right knee pain.  HISTORY OF PRESENT ILLNESS:  The patient is an 77 year old female with worsening right knee pain secondary to osteoarthritis.  The patient has elected to have a right total knee arthroplasty by Dr. [name] to decrease pain and increase function.  PAST MEDICAL HISTORY:  Hypertension.  FAMILY MEDICAL HISTORY:  Coronary artery disease.  SOCIAL HISTORY:  The patient of Dr. Selena Batten.  Does not smoke or use alcohol.  DRUG ALLERGIES:  None.  CURRENT MEDICATIONS:  Hydrochlorothiazide 12.5 mg daily, carvedilol 3.125 mg daily, and Tylenol PM p.r.n.  REVIEW OF SYSTEMS:  She has pain with range of motion of the right knee with pain with ambulation.  Otherwise, review of systems is negative.  PHYSICAL EXAMINATION:  VITAL SIGNS:  Pulse 76, respirations 16, blood pressure 144/66. GENERAL:  The patient is healthy-appearing 77 year old female, in no acute distress.  Pleasant mood and affect, oriented x3. HEAD AND NECK:  Cranial nerves II through XII grossly intact.  Neck shows full range of motion without any tenderness. CHEST:  Active breath sounds bilaterally.  No wheezes, rhonchi, or rales. HEART:  Shows regular rate and rhythm.  No murmur. ABDOMEN:  Nontender, nondistended with active bowel sounds. EXTREMITIES:  Shows mild 5-degree flexion contracture.  She does have an antalgic __________, gait, slow, uses a walker, but neurovascularly she is intact.  She does have some mild pedal edema.  No rashes.  Capillary refill less than 2 seconds. NEUROLOGIC:  She is intact.  X-rays show end-stage osteoarthritis of right  knee.  IMPRESSION:  End-stage osteoarthritis, right knee.  PLAN OF ACTION:  Right total knee arthroplasty by Dr. [name].     Thomas B. Ferne Coe.   ______________________________ Madlyn Frankel Charlann Boxer, M.D.    TBD/MEDQ  D:  10/12/2010  T:  10/12/2010  Job:  [id number]  Electronically Signed by Standley Dakins P.A. on 11/03/2010 08:30:10 AM Electronically Signed by [name] M.D. on 11/06/2010 04:22:35 PM

## 2011-07-07 ENCOUNTER — Inpatient Hospital Stay (HOSPITAL_COMMUNITY): Payer: Medicare Other

## 2011-07-07 ENCOUNTER — Encounter (HOSPITAL_COMMUNITY)
Admission: EM | Disposition: A | Discharge status: Rehabilitation Facility | Payer: Self-pay | Source: Home / Self Care | Attending: Cardiothoracic Surgery

## 2011-07-07 ENCOUNTER — Inpatient Hospital Stay (HOSPITAL_COMMUNITY)
Admission: EM | Admit: 2011-07-07 | Discharge: 2011-07-18 | DRG: 228 | Disposition: A | Discharge status: Rehabilitation Facility | Payer: Medicare Other | Source: Ambulatory Visit | Attending: Cardiothoracic Surgery | Admitting: Cardiothoracic Surgery

## 2011-07-07 ENCOUNTER — Ambulatory Visit (HOSPITAL_COMMUNITY): Admission: RE | Admit: 2011-07-07 | Payer: Self-pay | Source: Other Acute Inpatient Hospital | Admitting: Cardiology

## 2011-07-07 ENCOUNTER — Encounter (HOSPITAL_COMMUNITY): Payer: Self-pay | Admitting: *Deleted

## 2011-07-07 ENCOUNTER — Other Ambulatory Visit: Payer: Self-pay

## 2011-07-07 ENCOUNTER — Encounter: Payer: Self-pay | Admitting: Cardiovascular Disease

## 2011-07-07 ENCOUNTER — Encounter (HOSPITAL_COMMUNITY): Payer: Self-pay | Admitting: Anesthesiology

## 2011-07-07 ENCOUNTER — Other Ambulatory Visit: Payer: Self-pay | Admitting: Cardiothoracic Surgery

## 2011-07-07 ENCOUNTER — Encounter (HOSPITAL_COMMUNITY): Payer: Self-pay | Admitting: Cardiovascular Disease

## 2011-07-07 ENCOUNTER — Inpatient Hospital Stay (HOSPITAL_COMMUNITY): Payer: Medicare Other | Admitting: Anesthesiology

## 2011-07-07 DIAGNOSIS — M1710 Unilateral primary osteoarthritis, unspecified knee: Secondary | ICD-10-CM | POA: Insufficient documentation

## 2011-07-07 DIAGNOSIS — Q210 Ventricular septal defect: Secondary | ICD-10-CM | POA: Insufficient documentation

## 2011-07-07 DIAGNOSIS — I2510 Atherosclerotic heart disease of native coronary artery without angina pectoris: Secondary | ICD-10-CM

## 2011-07-07 DIAGNOSIS — C37 Malignant neoplasm of thymus: Secondary | ICD-10-CM | POA: Diagnosis not present

## 2011-07-07 DIAGNOSIS — D62 Acute posthemorrhagic anemia: Secondary | ICD-10-CM | POA: Diagnosis not present

## 2011-07-07 DIAGNOSIS — R222 Localized swelling, mass and lump, trunk: Secondary | ICD-10-CM

## 2011-07-07 DIAGNOSIS — I10 Essential (primary) hypertension: Secondary | ICD-10-CM | POA: Insufficient documentation

## 2011-07-07 DIAGNOSIS — I2119 ST elevation (STEMI) myocardial infarction involving other coronary artery of inferior wall: Secondary | ICD-10-CM

## 2011-07-07 DIAGNOSIS — I2109 ST elevation (STEMI) myocardial infarction involving other coronary artery of anterior wall: Secondary | ICD-10-CM

## 2011-07-07 DIAGNOSIS — I219 Acute myocardial infarction, unspecified: Secondary | ICD-10-CM

## 2011-07-07 DIAGNOSIS — I509 Heart failure, unspecified: Secondary | ICD-10-CM | POA: Diagnosis present

## 2011-07-07 DIAGNOSIS — R570 Cardiogenic shock: Secondary | ICD-10-CM | POA: Diagnosis present

## 2011-07-07 DIAGNOSIS — I152 Hypertension secondary to endocrine disorders: Secondary | ICD-10-CM | POA: Insufficient documentation

## 2011-07-07 DIAGNOSIS — I510 Cardiac septal defect, acquired: Secondary | ICD-10-CM | POA: Diagnosis present

## 2011-07-07 DIAGNOSIS — Q211 Atrial septal defect: Secondary | ICD-10-CM

## 2011-07-07 DIAGNOSIS — I5023 Acute on chronic systolic (congestive) heart failure: Secondary | ICD-10-CM | POA: Diagnosis present

## 2011-07-07 HISTORY — DX: Osteoarthritis of knee, unspecified: M17.9

## 2011-07-07 HISTORY — PX: CORONARY ARTERY BYPASS GRAFT: SHX141

## 2011-07-07 HISTORY — DX: Unilateral primary osteoarthritis, unspecified knee: M17.10

## 2011-07-07 HISTORY — PX: MASS EXCISION: SHX2000

## 2011-07-07 HISTORY — PX: LEFT HEART CATHETERIZATION WITH CORONARY ANGIOGRAM: SHX5451

## 2011-07-07 HISTORY — DX: Atherosclerotic heart disease of native coronary artery without angina pectoris: I25.10

## 2011-07-07 HISTORY — DX: ST elevation (STEMI) myocardial infarction involving other coronary artery of anterior wall: I21.09

## 2011-07-07 HISTORY — PX: VSD REPAIR: SHX276

## 2011-07-07 HISTORY — DX: Ventricular septal defect: Q21.0

## 2011-07-07 HISTORY — DX: Essential (primary) hypertension: I10

## 2011-07-07 HISTORY — PX: RIGHT HEART CATHETERIZATION: SHX5447

## 2011-07-07 LAB — POCT I-STAT 3, ART BLOOD GAS (G3+)
Acid-base deficit: 3 mmol/L — ABNORMAL HIGH (ref 0.0–2.0)
Acid-base deficit: 7 mmol/L — ABNORMAL HIGH (ref 0.0–2.0)
Bicarbonate: 19.9 mEq/L — ABNORMAL LOW (ref 20.0–24.0)
Bicarbonate: 24.2 mEq/L — ABNORMAL HIGH (ref 20.0–24.0)
Bicarbonate: 18.8 mEq/L — ABNORMAL LOW (ref 20.0–24.0)
O2 Saturation: 99 %
Patient temperature: 34.2
TCO2: 20 mmol/L (ref 0–100)
pCO2 arterial: 28.7 mmHg — ABNORMAL LOW (ref 35.0–45.0)
pCO2 arterial: 32.7 mmHg — ABNORMAL LOW (ref 35.0–45.0)
pH, Arterial: 7.449 — ABNORMAL HIGH (ref 7.350–7.400)
pH, Arterial: 7.354 (ref 7.350–7.400)
pO2, Arterial: 86 mmHg (ref 80.0–100.0)
pO2, Arterial: 119 mmHg — ABNORMAL HIGH (ref 80.0–100.0)

## 2011-07-07 LAB — CBC
HCT: 23.9 % — ABNORMAL LOW (ref 36.0–46.0)
Hemoglobin: 8.2 g/dL — ABNORMAL LOW (ref 12.0–15.0)
MCH: 28.9 pg (ref 26.0–34.0)
MCV: 84.2 fL (ref 78.0–100.0)
Platelets: 311 10*3/uL (ref 150–400)
RBC: 4.42 MIL/uL (ref 3.87–5.11)
RBC: 2.84 MIL/uL — ABNORMAL LOW (ref 3.87–5.11)
RDW: 13.9 % (ref 11.5–15.5)
WBC: 18.5 10*3/uL — ABNORMAL HIGH (ref 4.0–10.5)
WBC: 13.9 10*3/uL — ABNORMAL HIGH (ref 4.0–10.5)

## 2011-07-07 LAB — URINALYSIS, ROUTINE W REFLEX MICROSCOPIC
Bilirubin Urine: NEGATIVE
Glucose, UA: NEGATIVE mg/dL
Ketones, ur: NEGATIVE mg/dL
Leukocytes, UA: NEGATIVE
Nitrite: NEGATIVE
Protein, ur: 30 mg/dL — AB
Specific Gravity, Urine: >1.046 — ABNORMAL HIGH (ref 1.005–1.030)
Urobilinogen, UA: 0.2 mg/dL (ref 0.0–1.0)
pH: 6 (ref 5.0–8.0)

## 2011-07-07 LAB — POCT I-STAT 4, (NA,K, GLUC, HGB,HCT)
Glucose, Bld: 153 mg/dL — ABNORMAL HIGH (ref 70–99)
Glucose, Bld: 183 mg/dL — ABNORMAL HIGH (ref 70–99)
Glucose, Bld: 150 mg/dL — ABNORMAL HIGH (ref 70–99)
Glucose, Bld: 88 mg/dL (ref 70–99)
HCT: 34 % — ABNORMAL LOW (ref 36.0–46.0)
HCT: 25 % — ABNORMAL LOW (ref 36.0–46.0)
HCT: 20 % — ABNORMAL LOW (ref 36.0–46.0)
Hemoglobin: 11.6 g/dL — ABNORMAL LOW (ref 12.0–15.0)
Hemoglobin: 8.8 g/dL — ABNORMAL LOW (ref 12.0–15.0)
Hemoglobin: 8.5 g/dL — ABNORMAL LOW (ref 12.0–15.0)
Hemoglobin: 6.8 g/dL — CL (ref 12.0–15.0)
Potassium: 3.6 mEq/L (ref 3.5–5.1)
Potassium: 3.8 mEq/L (ref 3.5–5.1)
Potassium: 3.6 mEq/L (ref 3.5–5.1)
Potassium: 4.5 mEq/L (ref 3.5–5.1)
Potassium: 2.8 mEq/L — ABNORMAL LOW (ref 3.5–5.1)
Sodium: 141 mEq/L (ref 135–145)
Sodium: 144 mEq/L (ref 135–145)
Sodium: 142 mEq/L (ref 135–145)
Sodium: 144 mEq/L (ref 135–145)

## 2011-07-07 LAB — HEMOGLOBIN AND HEMATOCRIT, BLOOD
HCT: 25.8 % — ABNORMAL LOW (ref 36.0–46.0)
Hemoglobin: 8.9 g/dL — ABNORMAL LOW (ref 12.0–15.0)

## 2011-07-07 LAB — URINE MICROSCOPIC-ADD ON

## 2011-07-07 LAB — PROTIME-INR
INR: 1.34 (ref 0.00–1.49)
Prothrombin Time: 14.5 seconds (ref 11.6–15.2)
Prothrombin Time: 16.8 s — ABNORMAL HIGH (ref 11.6–15.2)

## 2011-07-07 LAB — POCT I-STAT 3, VENOUS BLOOD GAS (G3P V)
Bicarbonate: 20 mEq/L (ref 20.0–24.0)
Bicarbonate: 20.5 mEq/L (ref 20.0–24.0)
O2 Saturation: 87 %
Patient temperature: 98.7
TCO2: 21 mmol/L (ref 0–100)
pCO2, Ven: 28.8 mmHg — ABNORMAL LOW (ref 45.0–50.0)
pCO2, Ven: 32.8 mmHg — ABNORMAL LOW (ref 45.0–50.0)
pH, Ven: 7.393 — ABNORMAL HIGH (ref 7.250–7.300)
pO2, Ven: 48 mmHg — ABNORMAL HIGH (ref 30.0–45.0)

## 2011-07-07 LAB — COMPREHENSIVE METABOLIC PANEL
ALT: 41 U/L — ABNORMAL HIGH (ref 0–35)
AST: 146 U/L — ABNORMAL HIGH (ref 0–37)
Albumin: 3.4 g/dL — ABNORMAL LOW (ref 3.5–5.2)
Alkaline Phosphatase: 69 U/L (ref 39–117)
CO2: 19 mEq/L (ref 19–32)
Chloride: 100 mEq/L (ref 96–112)
Creatinine, Ser: 1.07 mg/dL (ref 0.50–1.10)
Potassium: 3.5 mEq/L (ref 3.5–5.1)
Sodium: 135 mEq/L (ref 135–145)
Total Bilirubin: 0.5 mg/dL (ref 0.3–1.2)

## 2011-07-07 LAB — CARDIAC PANEL(CRET KIN+CKTOT+MB+TROPI)
CK, MB: 205.2 ng/mL (ref 0.3–4.0)
CK, MB: 251.5 ng/mL (ref 0.3–4.0)
Relative Index: 11.2 — ABNORMAL HIGH (ref 0.0–2.5)
Total CK: 2252 U/L — ABNORMAL HIGH (ref 7–177)
Troponin I: 24.29 ng/mL (ref <0.30)

## 2011-07-07 LAB — APTT
aPTT: 49 s — ABNORMAL HIGH (ref 24–37)
aPTT: 53 seconds — ABNORMAL HIGH (ref 24–37)

## 2011-07-07 LAB — SURGICAL PCR SCREEN
MRSA, PCR: NEGATIVE
Staphylococcus aureus: NEGATIVE

## 2011-07-07 LAB — PLATELET COUNT: Platelets: 201 K/uL (ref 150–400)

## 2011-07-07 LAB — LIPID PANEL
HDL: 57 mg/dL (ref >39)
Total CHOL/HDL Ratio: 3 RATIO
VLDL: 14 mg/dL (ref 0–40)

## 2011-07-07 LAB — GLUCOSE, CAPILLARY
Glucose-Capillary: 114 mg/dL — ABNORMAL HIGH (ref 70–99)
Glucose-Capillary: 64 mg/dL — ABNORMAL LOW (ref 70–99)

## 2011-07-07 LAB — HEMOGLOBIN A1C: Mean Plasma Glucose: 126 mg/dL — ABNORMAL HIGH (ref <117)

## 2011-07-07 LAB — POCT ACTIVATED CLOTTING TIME: Activated Clotting Time: 408 seconds

## 2011-07-07 SURGERY — LEFT HEART CATHETERIZATION WITH CORONARY ANGIOGRAM
Anesthesia: LOCAL

## 2011-07-07 SURGERY — CORONARY ARTERY BYPASS GRAFTING (CABG)
Anesthesia: General | Site: Chest | Wound class: Clean

## 2011-07-07 MED ORDER — LACTATED RINGERS IV SOLN
INTRAVENOUS | Status: DC | PRN
  Administered 2011-07-07: 15:00:00 via INTRAVENOUS

## 2011-07-07 MED ORDER — VANCOMYCIN HCL 1000 MG IV SOLR: 1250.0000 mg | INTRAVENOUS | Status: DC

## 2011-07-07 MED ORDER — DEXTROSE 5 % IV SOLN: 1.5000 g | INTRAVENOUS | Status: DC

## 2011-07-07 MED ORDER — ROCURONIUM BROMIDE 100 MG/10ML IV SOLN
INTRAVENOUS | Status: DC | PRN
  Administered 2011-07-07: 50 mg via INTRAVENOUS

## 2011-07-07 MED ORDER — SODIUM CHLORIDE 0.9 % IV SOLN: INTRAVENOUS | Status: DC

## 2011-07-07 MED ORDER — MORPHINE SULFATE 4 MG/ML IJ SOLN
2.0000 mg | INTRAMUSCULAR | Status: DC | PRN
  Administered 2011-07-08 – 2011-07-09 (×8): 2 mg via INTRAVENOUS
  Administered 2011-07-09 (×2): 4 mg via INTRAVENOUS
  Administered 2011-07-09 – 2011-07-10 (×2): 2 mg via INTRAVENOUS
  Administered 2011-07-10 (×2): 1 mg via INTRAVENOUS
  Filled 2011-07-07 (×9): qty 1

## 2011-07-07 MED ORDER — OXYCODONE HCL 5 MG PO TABS
5.0000 mg | ORAL_TABLET | ORAL | Status: DC | PRN
  Administered 2011-07-10 – 2011-07-11 (×4): 5 mg via ORAL
  Filled 2011-07-07: qty 1
  Filled 2011-07-07: qty 2
  Filled 2011-07-07: qty 1
  Filled 2011-07-07 (×2): qty 2

## 2011-07-07 MED ORDER — MILRINONE IN DEXTROSE 200-5 MCG/ML-% IV SOLN
0.3000 ug/kg/min | INTRAVENOUS | Status: DC
  Administered 2011-07-08 – 2011-07-09 (×4): 0.3 ug/kg/min via INTRAVENOUS
  Filled 2011-07-07 (×5): qty 100

## 2011-07-07 MED ORDER — SODIUM CHLORIDE 0.9 % IV SOLN
10.0000 g | INTRAVENOUS | Status: DC | PRN
  Administered 2011-07-07: 5 g via INTRAVENOUS

## 2011-07-07 MED ORDER — ACETAMINOPHEN 325 MG PO TABS: 650.0000 mg | ORAL_TABLET | ORAL | Status: DC | PRN

## 2011-07-07 MED ORDER — SODIUM CHLORIDE 0.9 % IV SOLN
INTRAVENOUS | Status: DC
  Filled 2011-07-07: qty 40

## 2011-07-07 MED ORDER — HEPARIN SODIUM (PORCINE) 1000 UNIT/ML IJ SOLN
INTRAMUSCULAR | Status: DC | PRN
  Administered 2011-07-07: 28000 [IU] via INTRAVENOUS

## 2011-07-07 MED ORDER — VERAPAMIL HCL 2.5 MG/ML IV SOLN
INTRAVENOUS | Status: AC
  Administered 2011-07-07: 17:00:00
  Filled 2011-07-07 (×2): qty 2.5

## 2011-07-07 MED ORDER — SODIUM CHLORIDE 0.9 % IV SOLN: 1250.0000 mg | INTRAVENOUS | Status: DC

## 2011-07-07 MED ORDER — ACETAMINOPHEN 160 MG/5ML PO SOLN
975.0000 mg | Freq: Four times a day (QID) | ORAL | Status: AC
  Administered 2011-07-08 – 2011-07-10 (×5): 975 mg
  Filled 2011-07-07 (×4): qty 40.6

## 2011-07-07 MED ORDER — SODIUM CHLORIDE 0.9 % IV SOLN: 0.1000 ug/kg/h | INTRAVENOUS | Status: DC

## 2011-07-07 MED ORDER — ROSUVASTATIN CALCIUM 40 MG PO TABS: 40.0000 mg | ORAL_TABLET | Freq: Every day | ORAL | Status: DC

## 2011-07-07 MED ORDER — METOPROLOL TARTRATE 25 MG/10 ML ORAL SUSPENSION
12.5000 mg | Freq: Two times a day (BID) | ORAL | Status: DC
  Filled 2011-07-07 (×21): qty 5

## 2011-07-07 MED ORDER — MAGNESIUM SULFATE 40 MG/ML IJ SOLN
INTRAMUSCULAR | Status: AC
  Administered 2011-07-07: 4 g via INTRAVENOUS
  Filled 2011-07-07: qty 100

## 2011-07-07 MED ORDER — SODIUM CHLORIDE 0.9 % IV SOLN
250.0000 mL | INTRAVENOUS | Status: DC
  Administered 2011-07-10 – 2011-07-13 (×2): 250 mL via INTRAVENOUS

## 2011-07-07 MED ORDER — EPINEPHRINE HCL 1 MG/ML IJ SOLN
0.5000 ug/min | INTRAVENOUS | Status: DC
  Filled 2011-07-07: qty 4

## 2011-07-07 MED ORDER — DOPAMINE-DEXTROSE 3.2-5 MG/ML-% IV SOLN
0.0000 ug/kg/min | INTRAVENOUS | Status: DC
  Administered 2011-07-10: 3 ug/kg/min via INTRAVENOUS
  Filled 2011-07-07: qty 250

## 2011-07-07 MED ORDER — DEXTROSE 50 % IV SOLN
INTRAVENOUS | Status: AC
  Administered 2011-07-07: 14 mL
  Filled 2011-07-07: qty 50

## 2011-07-07 MED ORDER — SODIUM CHLORIDE 0.9 % IV SOLN: 250.0000 mL | INTRAVENOUS | Status: DC | PRN

## 2011-07-07 MED ORDER — NITROGLYCERIN IN D5W 200-5 MCG/ML-% IV SOLN
2.0000 ug/min | INTRAVENOUS | Status: DC
  Filled 2011-07-07: qty 250

## 2011-07-07 MED ORDER — VANCOMYCIN HCL 1000 MG IV SOLR
1000.0000 mg | INTRAVENOUS | Status: DC | PRN
  Administered 2011-07-07: 1000 mg via INTRAVENOUS

## 2011-07-07 MED ORDER — VECURONIUM BROMIDE 10 MG IV SOLR
INTRAVENOUS | Status: DC | PRN
  Administered 2011-07-07: 3 mg via INTRAVENOUS
  Administered 2011-07-07: 7 mg via INTRAVENOUS

## 2011-07-07 MED ORDER — DOPAMINE-DEXTROSE 3.2-5 MG/ML-% IV SOLN
INTRAVENOUS | Status: DC | PRN
  Administered 2011-07-07: 3 ug/kg/min via INTRAVENOUS

## 2011-07-07 MED ORDER — CHLORHEXIDINE GLUCONATE 4 % EX LIQD
60.0000 mL | Freq: Once | CUTANEOUS | Status: DC
  Filled 2011-07-07: qty 60

## 2011-07-07 MED ORDER — MAGNESIUM SULFATE 50 % IJ SOLN
40.0000 meq | INTRAMUSCULAR | Status: DC
  Filled 2011-07-07: qty 10

## 2011-07-07 MED ORDER — DOPAMINE-DEXTROSE 3.2-5 MG/ML-% IV SOLN: 2.0000 ug/kg/min | INTRAVENOUS | Status: DC

## 2011-07-07 MED ORDER — INSULIN REGULAR BOLUS VIA INFUSION
0.0000 [IU] | Freq: Three times a day (TID) | INTRAVENOUS | Status: DC
  Administered 2011-07-08: 3.5 [IU]
  Administered 2011-07-08: 2.8 [IU]
  Administered 2011-07-08 (×2): 3.1 [IU]
  Filled 2011-07-07: qty 10

## 2011-07-07 MED ORDER — ASPIRIN EC 325 MG PO TBEC
325.0000 mg | DELAYED_RELEASE_TABLET | Freq: Every day | ORAL | Status: DC
  Administered 2011-07-09 – 2011-07-17 (×8): 325 mg via ORAL
  Filled 2011-07-07 (×10): qty 1

## 2011-07-07 MED ORDER — MAGNESIUM SULFATE 40 MG/ML IJ SOLN
4.0000 g | Freq: Once | INTRAMUSCULAR | Status: AC
  Administered 2011-07-07: 4 g via INTRAVENOUS

## 2011-07-07 MED ORDER — DOCUSATE SODIUM 100 MG PO CAPS: 200.0000 mg | ORAL_CAPSULE | Freq: Every day | ORAL | Status: DC

## 2011-07-07 MED ORDER — MORPHINE SULFATE 2 MG/ML IJ SOLN: 1.0000 mg | INTRAMUSCULAR | Status: AC | PRN

## 2011-07-07 MED ORDER — ALPRAZOLAM 0.25 MG PO TABS: 0.2500 mg | ORAL_TABLET | Freq: Two times a day (BID) | ORAL | Status: DC | PRN

## 2011-07-07 MED ORDER — MIDAZOLAM HCL 2 MG/2ML IJ SOLN
2.0000 mg | INTRAMUSCULAR | Status: DC | PRN
  Administered 2011-07-09 – 2011-07-10 (×2): 1 mg via INTRAVENOUS
  Filled 2011-07-07: qty 2

## 2011-07-07 MED ORDER — NITROGLYCERIN IN D5W 200-5 MCG/ML-% IV SOLN: 2.0000 ug/min | INTRAVENOUS | Status: DC

## 2011-07-07 MED ORDER — DOPAMINE-DEXTROSE 3.2-5 MG/ML-% IV SOLN
2.0000 ug/kg/min | INTRAVENOUS | Status: DC
  Filled 2011-07-07: qty 250

## 2011-07-07 MED ORDER — NITROGLYCERIN 0.4 MG SL SUBL: 0.4000 mg | SUBLINGUAL_TABLET | SUBLINGUAL | Status: DC | PRN

## 2011-07-07 MED ORDER — SODIUM CHLORIDE 0.9 % IJ SOLN
OROMUCOSAL | Status: DC | PRN
  Administered 2011-07-07: 17:00:00 via TOPICAL

## 2011-07-07 MED ORDER — SODIUM CHLORIDE 0.9 % IV SOLN
0.1000 ug/kg/h | INTRAVENOUS | Status: DC
  Filled 2011-07-07: qty 4

## 2011-07-07 MED ORDER — DEXTROSE 5 % IV SOLN
1.5000 g | INTRAVENOUS | Status: DC
  Filled 2011-07-07: qty 1.5

## 2011-07-07 MED ORDER — PHENYLEPHRINE HCL 10 MG/ML IJ SOLN
30.0000 ug/min | INTRAVENOUS | Status: DC
  Filled 2011-07-07: qty 2

## 2011-07-07 MED ORDER — SODIUM CHLORIDE 0.9 % IJ SOLN: 3.0000 mL | INTRAMUSCULAR | Status: DC | PRN

## 2011-07-07 MED ORDER — ONDANSETRON HCL 4 MG/2ML IJ SOLN: 4.0000 mg | Freq: Four times a day (QID) | INTRAMUSCULAR | Status: DC | PRN

## 2011-07-07 MED ORDER — SODIUM CHLORIDE 0.9 % IV SOLN
0.1000 ug/kg/h | INTRAVENOUS | Status: DC
  Administered 2011-07-08: 0.7 ug/kg/h via INTRAVENOUS
  Administered 2011-07-08: 0.5 ug/kg/h via INTRAVENOUS
  Administered 2011-07-09 – 2011-07-10 (×5): 0.7 ug/kg/h via INTRAVENOUS
  Filled 2011-07-07 (×10): qty 2

## 2011-07-07 MED ORDER — LACTATED RINGERS IV SOLN: 500.0000 mL | Freq: Once | INTRAVENOUS | Status: AC | PRN

## 2011-07-07 MED ORDER — SODIUM CHLORIDE 0.9 % IJ SOLN: 3.0000 mL | Freq: Two times a day (BID) | INTRAMUSCULAR | Status: DC

## 2011-07-07 MED ORDER — VERAPAMIL HCL 2.5 MG/ML IV SOLN: INTRAVENOUS | Status: DC

## 2011-07-07 MED ORDER — 0.9 % SODIUM CHLORIDE (POUR BTL) OPTIME
TOPICAL | Status: DC | PRN
  Administered 2011-07-07: 1000 mL

## 2011-07-07 MED ORDER — NITROGLYCERIN IN D5W 200-5 MCG/ML-% IV SOLN
INTRAVENOUS | Status: DC | PRN
  Administered 2011-07-07: 3 ug/min via INTRAVENOUS

## 2011-07-07 MED ORDER — DEXTROSE 5 % IV SOLN
1.5000 g | INTRAVENOUS | Status: DC | PRN
  Administered 2011-07-07: 1.5 g via INTRAVENOUS

## 2011-07-07 MED ORDER — SODIUM CHLORIDE 0.9 % IV SOLN
200.0000 ug | INTRAVENOUS | Status: DC | PRN
  Administered 2011-07-07: 0.2 ug/kg/h via INTRAVENOUS

## 2011-07-07 MED ORDER — MORPHINE SULFATE 2 MG/ML IJ SOLN: 1.0000 mg | INTRAMUSCULAR | Status: DC | PRN

## 2011-07-07 MED ORDER — HEPARIN (PORCINE) IN NACL 2-0.9 UNIT/ML-% IJ SOLN
INTRAMUSCULAR | Status: AC
  Filled 2011-07-07: qty 2000

## 2011-07-07 MED ORDER — METOPROLOL TARTRATE 12.5 MG HALF TABLET
12.5000 mg | ORAL_TABLET | Freq: Two times a day (BID) | ORAL | Status: DC
  Administered 2011-07-10 – 2011-07-17 (×15): 12.5 mg via ORAL
  Filled 2011-07-07 (×21): qty 1

## 2011-07-07 MED ORDER — SODIUM CHLORIDE 0.9 % IV SOLN
INTRAVENOUS | Status: DC
  Filled 2011-07-07: qty 1

## 2011-07-07 MED ORDER — HEMOSTATIC AGENTS (NO CHARGE) OPTIME
TOPICAL | Status: DC | PRN
  Administered 2011-07-07: 1 via TOPICAL

## 2011-07-07 MED ORDER — LIDOCAINE HCL (PF) 1 % IJ SOLN
INTRAMUSCULAR | Status: AC
  Filled 2011-07-07: qty 30

## 2011-07-07 MED ORDER — POTASSIUM CHLORIDE 10 MEQ/50ML IV SOLN
10.0000 meq | INTRAVENOUS | Status: AC
Start: 2011-07-07 — End: 2011-07-08
  Administered 2011-07-07 (×3): 10 meq via INTRAVENOUS

## 2011-07-07 MED ORDER — SODIUM CHLORIDE 0.9 % IV SOLN
100.0000 [IU] | INTRAVENOUS | Status: DC | PRN
  Administered 2011-07-07: 1 [IU]/h via INTRAVENOUS

## 2011-07-07 MED ORDER — LACTATED RINGERS IV SOLN: INTRAVENOUS | Status: DC

## 2011-07-07 MED ORDER — SODIUM CHLORIDE 0.45 % IV SOLN
INTRAVENOUS | Status: DC
  Administered 2011-07-07 – 2011-07-10 (×3): 20 mL/h via INTRAVENOUS

## 2011-07-07 MED ORDER — ACETAMINOPHEN 500 MG PO TABS
1000.0000 mg | ORAL_TABLET | Freq: Four times a day (QID) | ORAL | Status: AC
  Administered 2011-07-09 – 2011-07-12 (×11): 1000 mg via ORAL
  Filled 2011-07-07 (×18): qty 2

## 2011-07-07 MED ORDER — ACETAMINOPHEN 650 MG RE SUPP: 650.0000 mg | RECTAL | Status: AC

## 2011-07-07 MED ORDER — ASPIRIN EC 81 MG PO TBEC: 81.0000 mg | DELAYED_RELEASE_TABLET | Freq: Every day | ORAL | Status: DC

## 2011-07-07 MED ORDER — POTASSIUM CHLORIDE 2 MEQ/ML IV SOLN
80.0000 meq | INTRAVENOUS | Status: DC
  Filled 2011-07-07: qty 40

## 2011-07-07 MED ORDER — LACTATED RINGERS IV SOLN
INTRAVENOUS | Status: DC | PRN
  Administered 2011-07-07: 16:00:00 via INTRAVENOUS

## 2011-07-07 MED ORDER — PROTAMINE SULFATE 10 MG/ML IV SOLN
INTRAVENOUS | Status: DC | PRN
  Administered 2011-07-07: 180 mg via INTRAVENOUS
  Administered 2011-07-07: 20 mg via INTRAVENOUS

## 2011-07-07 MED ORDER — FENTANYL CITRATE 0.05 MG/ML IJ SOLN
INTRAMUSCULAR | Status: DC | PRN
  Administered 2011-07-07: 250 ug via INTRAVENOUS
  Administered 2011-07-07: 500 ug via INTRAVENOUS
  Administered 2011-07-07: 250 ug via INTRAVENOUS
  Administered 2011-07-07: 50 ug via INTRAVENOUS
  Administered 2011-07-07: 200 ug via INTRAVENOUS
  Administered 2011-07-07: 250 ug via INTRAVENOUS

## 2011-07-07 MED ORDER — MILRINONE IN DEXTROSE 200-5 MCG/ML-% IV SOLN
INTRAVENOUS | Status: DC | PRN
  Administered 2011-07-07: 0.375 ug/kg/min via INTRAVENOUS

## 2011-07-07 MED ORDER — HEPARIN (PORCINE) IN NACL 2-0.9 UNIT/ML-% IJ SOLN
INTRAMUSCULAR | Status: AC
  Filled 2011-07-07: qty 1000

## 2011-07-07 MED ORDER — VANCOMYCIN HCL 1000 MG IV SOLR
1250.0000 mg | INTRAVENOUS | Status: DC
  Filled 2011-07-07: qty 1250

## 2011-07-07 MED ORDER — HEPARIN SOD (PORCINE) IN D5W 100 UNIT/ML IV SOLN
750.0000 [IU]/h | INTRAVENOUS | Status: DC
  Administered 2011-07-07: 750 [IU]/h via INTRAVENOUS
  Filled 2011-07-07: qty 250

## 2011-07-07 MED ORDER — DEXTROSE 5 % IV SOLN
750.0000 mg | INTRAVENOUS | Status: DC
  Filled 2011-07-07: qty 750

## 2011-07-07 MED ORDER — MIDAZOLAM HCL 5 MG/5ML IJ SOLN
INTRAMUSCULAR | Status: DC | PRN
  Administered 2011-07-07: 2 mg via INTRAVENOUS
  Administered 2011-07-07: 4 mg via INTRAVENOUS
  Administered 2011-07-07: 1 mg via INTRAVENOUS

## 2011-07-07 MED ORDER — BISACODYL 5 MG PO TBEC
10.0000 mg | DELAYED_RELEASE_TABLET | Freq: Every day | ORAL | Status: DC
  Administered 2011-07-08 – 2011-07-16 (×6): 10 mg via ORAL
  Filled 2011-07-07 (×3): qty 2
  Filled 2011-07-07: qty 1
  Filled 2011-07-07 (×3): qty 2

## 2011-07-07 MED ORDER — METOPROLOL TARTRATE 1 MG/ML IV SOLN: 2.5000 mg | INTRAVENOUS | Status: DC | PRN

## 2011-07-07 MED ORDER — CEFUROXIME SODIUM 1.5 G IJ SOLR: 1.5000 g | INTRAMUSCULAR | Status: DC

## 2011-07-07 MED ORDER — LACTATED RINGERS IV SOLN
INTRAVENOUS | Status: DC | PRN
  Administered 2011-07-07 (×2): via INTRAVENOUS

## 2011-07-07 MED ORDER — FAMOTIDINE IN NACL 20-0.9 MG/50ML-% IV SOLN
20.0000 mg | Freq: Two times a day (BID) | INTRAVENOUS | Status: AC
  Administered 2011-07-07 – 2011-07-08 (×2): 20 mg via INTRAVENOUS
  Filled 2011-07-07: qty 50

## 2011-07-07 MED ORDER — ALBUMIN HUMAN 5 % IV SOLN
250.0000 mL | INTRAVENOUS | Status: AC | PRN
  Administered 2011-07-07 – 2011-07-08 (×4): 250 mL via INTRAVENOUS
  Filled 2011-07-07 (×2): qty 250

## 2011-07-07 MED ORDER — BISACODYL 10 MG RE SUPP
10.0000 mg | Freq: Every day | RECTAL | Status: DC
  Administered 2011-07-10: 10 mg via RECTAL
  Filled 2011-07-07 (×2): qty 1

## 2011-07-07 MED ORDER — PHENYLEPHRINE HCL 10 MG/ML IJ SOLN
20.0000 mg | INTRAVENOUS | Status: DC | PRN
  Administered 2011-07-07: 50 ug/min via INTRAVENOUS

## 2011-07-07 MED ORDER — NITROGLYCERIN 0.2 MG/ML ON CALL CATH LAB
INTRAVENOUS | Status: AC
  Filled 2011-07-07: qty 1

## 2011-07-07 MED ORDER — NITROGLYCERIN IN D5W 200-5 MCG/ML-% IV SOLN: 0.0000 ug/min | INTRAVENOUS | Status: DC

## 2011-07-07 MED ORDER — SODIUM CHLORIDE 0.9 % IV SOLN
INTRAVENOUS | Status: DC
  Administered 2011-07-07 – 2011-07-08 (×2): 20 mL via INTRAVENOUS

## 2011-07-07 MED ORDER — SODIUM CHLORIDE 0.9 % IV SOLN
INTRAVENOUS | Status: DC
  Filled 2011-07-07 (×2): qty 1

## 2011-07-07 MED ORDER — SODIUM CHLORIDE 0.9 % IJ SOLN
3.0000 mL | Freq: Two times a day (BID) | INTRAMUSCULAR | Status: DC
  Administered 2011-07-08 – 2011-07-10 (×5): 3 mL via INTRAVENOUS

## 2011-07-07 MED ORDER — MAGNESIUM SULFATE 50 % IJ SOLN: 40.0000 meq | INTRAMUSCULAR | Status: DC

## 2011-07-07 MED ORDER — PANTOPRAZOLE SODIUM 40 MG PO TBEC: 40.0000 mg | DELAYED_RELEASE_TABLET | Freq: Every day | ORAL | Status: DC

## 2011-07-07 MED ORDER — ROSUVASTATIN CALCIUM 40 MG PO TABS
40.0000 mg | ORAL_TABLET | Freq: Every day | ORAL | Status: DC
Start: 2011-07-07 — End: 2011-07-10
  Filled 2011-07-07 (×4): qty 1

## 2011-07-07 MED ORDER — EPINEPHRINE HCL 1 MG/ML IJ SOLN: 0.5000 ug/min | INTRAVENOUS | Status: DC

## 2011-07-07 MED ORDER — PHENYLEPHRINE HCL 10 MG/ML IJ SOLN: 30.0000 ug/min | INTRAMUSCULAR | Status: DC

## 2011-07-07 MED ORDER — PHENYLEPHRINE HCL 10 MG/ML IJ SOLN
0.0000 ug/min | INTRAVENOUS | Status: DC
  Administered 2011-07-08: 15 ug/min via INTRAVENOUS
  Administered 2011-07-08: 60 ug/min via INTRAVENOUS
  Administered 2011-07-09: 40 ug/min via INTRAVENOUS
  Filled 2011-07-07 (×8): qty 2

## 2011-07-07 MED ORDER — CHLORHEXIDINE GLUCONATE 4 % EX LIQD: 60.0000 mL | Freq: Once | CUTANEOUS | Status: DC

## 2011-07-07 MED ORDER — ETOMIDATE 2 MG/ML IV SOLN
INTRAVENOUS | Status: DC | PRN
  Administered 2011-07-07: 16 mg via INTRAVENOUS

## 2011-07-07 MED ORDER — ONDANSETRON HCL 4 MG/2ML IJ SOLN
4.0000 mg | Freq: Four times a day (QID) | INTRAMUSCULAR | Status: DC | PRN
  Administered 2011-07-11 – 2011-07-16 (×5): 4 mg via INTRAVENOUS
  Filled 2011-07-07 (×5): qty 2

## 2011-07-07 MED ORDER — ACETAMINOPHEN 160 MG/5ML PO SOLN
650.0000 mg | ORAL | Status: AC
  Filled 2011-07-07 (×2): qty 40.6

## 2011-07-07 MED ORDER — DEXTROSE 5 % IV SOLN: 750.0000 mg | INTRAVENOUS | Status: DC

## 2011-07-07 MED ORDER — POTASSIUM CHLORIDE 2 MEQ/ML IV SOLN: 80.0000 meq | INTRAVENOUS | Status: DC

## 2011-07-07 MED ORDER — DEXTROSE 5 % IV SOLN
1.5000 g | Freq: Two times a day (BID) | INTRAVENOUS | Status: AC
  Administered 2011-07-08 – 2011-07-09 (×4): 1.5 g via INTRAVENOUS
  Filled 2011-07-07 (×4): qty 1.5

## 2011-07-07 MED ORDER — VANCOMYCIN HCL 1000 MG IV SOLR
1000.0000 mg | Freq: Once | INTRAVENOUS | Status: AC
  Administered 2011-07-08: 1000 mg via INTRAVENOUS
  Filled 2011-07-07: qty 1000

## 2011-07-07 MED ORDER — ALBUMIN HUMAN 5 % IV SOLN
INTRAVENOUS | Status: DC | PRN
  Administered 2011-07-07: 21:00:00 via INTRAVENOUS

## 2011-07-07 MED ORDER — MORPHINE SULFATE 10 MG/ML IJ SOLN: 1.0000 mg | INTRAMUSCULAR | Status: DC | PRN

## 2011-07-07 MED ORDER — ASPIRIN 81 MG PO CHEW
324.0000 mg | CHEWABLE_TABLET | Freq: Every day | ORAL | Status: DC
  Administered 2011-07-10: 324 mg
  Filled 2011-07-07: qty 4

## 2011-07-07 MED ORDER — BIVALIRUDIN 250 MG IV SOLR
INTRAVENOUS | Status: AC
  Filled 2011-07-07: qty 250

## 2011-07-07 MED ORDER — SODIUM CHLORIDE 0.9 % IV SOLN
INTRAVENOUS | Status: DC | PRN
  Administered 2011-07-07: 15:00:00 via INTRAVENOUS

## 2011-07-07 SURGICAL SUPPLY — 145 items
ADH SKN CLS APL DERMABOND .7 (GAUZE/BANDAGES/DRESSINGS) ×1
ATTRACTOMAT 16X20 MAGNETIC DRP (DRAPES) ×3 IMPLANT
BAG DECANTER FOR FLEXI CONT (MISCELLANEOUS) ×3 IMPLANT
BANDAGE ELASTIC 4 VELCRO ST LF (GAUZE/BANDAGES/DRESSINGS) ×3 IMPLANT
BANDAGE ELASTIC 6 VELCRO ST LF (GAUZE/BANDAGES/DRESSINGS) ×3 IMPLANT
BANDAGE GAUZE ELAST BULKY 4 IN (GAUZE/BANDAGES/DRESSINGS) ×3 IMPLANT
BLADE SAW STERNAL (BLADE) ×3 IMPLANT
BLADE SURG 11 STRL SS (BLADE) ×2 IMPLANT
BLADE SURG ROTATE 9660 (MISCELLANEOUS) IMPLANT
CANISTER SUCTION 2500CC (MISCELLANEOUS) ×3 IMPLANT
CANN PRFSN .5XCNCT 15X34-48 (MISCELLANEOUS) ×1
CANN PRFSN 3/8X14X24FR PCFC (MISCELLANEOUS) ×1
CANN PRFSN 3/8XCNCT ST RT ANG (MISCELLANEOUS) ×1
CANN PRFSN 3/8XRT ANG TPR 14 (MISCELLANEOUS) ×1
CANNULA PRFSN .5XCNCT 15X34-48 (MISCELLANEOUS) ×1 IMPLANT
CANNULA PRFSN 3/8X14X24FR PCFC (MISCELLANEOUS) IMPLANT
CANNULA PRFSN 3/8XCNCT RT ANG (MISCELLANEOUS) IMPLANT
CANNULA PRFSN 3/8XRT ANG TPR14 (MISCELLANEOUS) IMPLANT
CANNULA VEN 2 STAGE (MISCELLANEOUS) ×3
CANNULA VEN MTL TIP RT (MISCELLANEOUS) ×9
CANNULA VESSEL W/WING WO/VALVE (CANNULA) IMPLANT
CATH CPB KIT GERHARDT (MISCELLANEOUS) ×3 IMPLANT
CATH ROBINSON RED A/P 18FR (CATHETERS) ×6 IMPLANT
CATH THORACIC 28FR (CATHETERS) ×5 IMPLANT
CATH THORACIC 28FR RT ANG (CATHETERS) IMPLANT
CATH THORACIC 36FR (CATHETERS) IMPLANT
CATH THORACIC 36FR RT ANG (CATHETERS) IMPLANT
CLIP FOGARTY SPRING 6M (CLIP) IMPLANT
CLIP TI MEDIUM 24 (CLIP) IMPLANT
CLIP TI WIDE RED SMALL 24 (CLIP) IMPLANT
CLOTH BEACON ORANGE TIMEOUT ST (SAFETY) ×3 IMPLANT
CONN ST 1/4X3/8  BEN (MISCELLANEOUS) ×4
CONN ST 1/4X3/8 BEN (MISCELLANEOUS) IMPLANT
CONT SPEC 4OZ CLIKSEAL STRL BL (MISCELLANEOUS) ×2 IMPLANT
COVER SURGICAL LIGHT HANDLE (MISCELLANEOUS) ×6 IMPLANT
CRADLE DONUT ADULT HEAD (MISCELLANEOUS) ×3 IMPLANT
DERMABOND ADVANCED (GAUZE/BANDAGES/DRESSINGS) ×2
DERMABOND ADVANCED .7 DNX12 (GAUZE/BANDAGES/DRESSINGS) IMPLANT
DRAIN CHANNEL 28F RND 3/8 FF (WOUND CARE) ×5 IMPLANT
DRAIN CHANNEL 32F RND 10.7 FF (WOUND CARE) IMPLANT
DRAPE CARDIOVASCULAR INCISE (DRAPES) ×3
DRAPE PROXIMA HALF (DRAPES) ×2 IMPLANT
DRAPE SLUSH MACHINE 52X66 (DRAPES) IMPLANT
DRAPE SLUSH/WARMER DISC (DRAPES) IMPLANT
DRAPE SRG 135X102X78XABS (DRAPES) ×1 IMPLANT
DRSG COVADERM 4X14 (GAUZE/BANDAGES/DRESSINGS) ×3 IMPLANT
DRSG TELFA 4X14 ISLAND ADH (GAUZE/BANDAGES/DRESSINGS) ×2 IMPLANT
ELECT BLADE 4.0 EZ CLEAN MEGAD (MISCELLANEOUS) ×3
ELECT CAUTERY BLADE 6.4 (BLADE) ×3 IMPLANT
ELECT REM PT RETURN 9FT ADLT (ELECTROSURGICAL) ×6
ELECTRODE BLDE 4.0 EZ CLN MEGD (MISCELLANEOUS) ×1 IMPLANT
ELECTRODE REM PT RTRN 9FT ADLT (ELECTROSURGICAL) ×2 IMPLANT
FELT TEFLON 6X6 (MISCELLANEOUS) ×8 IMPLANT
GLOVE BIO SURGEON STRL SZ 6 (GLOVE) IMPLANT
GLOVE BIO SURGEON STRL SZ 6.5 (GLOVE) ×8 IMPLANT
GLOVE BIO SURGEON STRL SZ7 (GLOVE) IMPLANT
GLOVE BIO SURGEON STRL SZ7.5 (GLOVE) IMPLANT
GLOVE BIO SURGEONS STRL SZ 6.5 (GLOVE) ×5
GLOVE BIOGEL M 6.5 STRL (GLOVE) ×2 IMPLANT
GLOVE BIOGEL M 7.0 STRL (GLOVE) ×2 IMPLANT
GLOVE BIOGEL PI IND STRL 6 (GLOVE) IMPLANT
GLOVE BIOGEL PI IND STRL 6.5 (GLOVE) IMPLANT
GLOVE BIOGEL PI IND STRL 7.0 (GLOVE) IMPLANT
GLOVE BIOGEL PI INDICATOR 6 (GLOVE) ×2
GLOVE BIOGEL PI INDICATOR 6.5 (GLOVE)
GLOVE BIOGEL PI INDICATOR 7.0 (GLOVE) ×2
GLOVE EUDERMIC 7 POWDERFREE (GLOVE) IMPLANT
GLOVE ORTHO TXT STRL SZ7.5 (GLOVE) IMPLANT
GOWN STRL NON-REIN LRG LVL3 (GOWN DISPOSABLE) ×16 IMPLANT
HEMOSTAT POWDER SURGIFOAM 1G (HEMOSTASIS) ×9 IMPLANT
HEMOSTAT SURGICEL 2X14 (HEMOSTASIS) ×5 IMPLANT
INSERT FOGARTY 61MM (MISCELLANEOUS) IMPLANT
INSERT FOGARTY XLG (MISCELLANEOUS) IMPLANT
KIT BASIN OR (CUSTOM PROCEDURE TRAY) ×3 IMPLANT
KIT ROOM TURNOVER OR (KITS) ×3 IMPLANT
KIT SUCTION CATH 14FR (SUCTIONS) ×6 IMPLANT
KIT VASOVIEW W/TROCAR VH 2000 (KITS) ×3 IMPLANT
LEAD PACING MYOCARDI (MISCELLANEOUS) ×3 IMPLANT
LINE VENT (MISCELLANEOUS) ×2 IMPLANT
LOOP VESSEL SUPERMAXI WHITE (MISCELLANEOUS) ×2 IMPLANT
MARKER GRAFT CORONARY BYPASS (MISCELLANEOUS) ×9 IMPLANT
NS IRRIG 1000ML POUR BTL (IV SOLUTION) ×15 IMPLANT
PACK OPEN HEART (CUSTOM PROCEDURE TRAY) ×3 IMPLANT
PAD ARMBOARD 7.5X6 YLW CONV (MISCELLANEOUS) ×6 IMPLANT
PENCIL BUTTON HOLSTER BLD 10FT (ELECTRODE) ×3 IMPLANT
PUNCH AORTIC ROTATE 4.0MM (MISCELLANEOUS) ×2 IMPLANT
PUNCH AORTIC ROTATE 4.5MM 8IN (MISCELLANEOUS) IMPLANT
PUNCH AORTIC ROTATE 5MM 8IN (MISCELLANEOUS) IMPLANT
SEALANT SURG COSEAL 8ML (VASCULAR PRODUCTS) ×2 IMPLANT
SET CARDIOPLEGIA MPS 5001102 (MISCELLANEOUS) ×2 IMPLANT
SOLUTION ANTI FOG 6CC (MISCELLANEOUS) IMPLANT
SPONGE GAUZE 4X4 12PLY (GAUZE/BANDAGES/DRESSINGS) ×4 IMPLANT
SPONGE INTESTINAL PEANUT (DISPOSABLE) IMPLANT
SPONGE LAP 18X18 X RAY DECT (DISPOSABLE) IMPLANT
SPONGE LAP 4X18 X RAY DECT (DISPOSABLE) IMPLANT
SUT BONE WAX W31G (SUTURE) IMPLANT
SUT CHROMIC 3 0 SH 27 (SUTURE) ×4 IMPLANT
SUT ETHIBOND NAB MH 2-0 36IN (SUTURE) ×10 IMPLANT
SUT MNCRL AB 4-0 PS2 18 (SUTURE) ×2 IMPLANT
SUT PROLENE 2 0 MH 48 (SUTURE) ×4 IMPLANT
SUT PROLENE 3 0 RB 1 (SUTURE) ×18 IMPLANT
SUT PROLENE 3 0 SH 1 (SUTURE) ×3 IMPLANT
SUT PROLENE 3 0 SH 48 (SUTURE) ×2 IMPLANT
SUT PROLENE 3 0 SH DA (SUTURE) ×8 IMPLANT
SUT PROLENE 3 0 SH1 36 (SUTURE) ×4 IMPLANT
SUT PROLENE 4 0 RB 1 (SUTURE)
SUT PROLENE 4 0 SH DA (SUTURE) IMPLANT
SUT PROLENE 4 0 TF (SUTURE) IMPLANT
SUT PROLENE 4-0 RB1 .5 CRCL 36 (SUTURE) IMPLANT
SUT PROLENE 5 0 C 1 36 (SUTURE) IMPLANT
SUT PROLENE 6 0 C 1 30 (SUTURE) IMPLANT
SUT PROLENE 6 0 CC (SUTURE) ×8 IMPLANT
SUT PROLENE 7 0 BV 1 (SUTURE) IMPLANT
SUT PROLENE 7 0 BV1 MDA (SUTURE) ×3 IMPLANT
SUT PROLENE 7.0 RB 3 (SUTURE) IMPLANT
SUT PROLENE 8 0 BV175 6 (SUTURE) IMPLANT
SUT SILK  1 MH (SUTURE) ×8
SUT SILK 1 MH (SUTURE) IMPLANT
SUT SILK 2 0 SH CR/8 (SUTURE) ×4 IMPLANT
SUT SILK 3 0 SH CR/8 (SUTURE) IMPLANT
SUT STEEL 6MS V (SUTURE) IMPLANT
SUT STEEL STERNAL CCS#1 18IN (SUTURE) ×3 IMPLANT
SUT STEEL SZ 6 DBL 3X14 BALL (SUTURE) ×3 IMPLANT
SUT VIC AB 1 CTX 18 (SUTURE) ×6 IMPLANT
SUT VIC AB 1 CTX 36 (SUTURE)
SUT VIC AB 1 CTX36XBRD ANBCTR (SUTURE) IMPLANT
SUT VIC AB 2-0 CT1 27 (SUTURE) ×3
SUT VIC AB 2-0 CT1 TAPERPNT 27 (SUTURE) IMPLANT
SUT VIC AB 2-0 CTX 27 (SUTURE) IMPLANT
SUT VIC AB 3-0 SH 27 (SUTURE)
SUT VIC AB 3-0 SH 27X BRD (SUTURE) IMPLANT
SUT VIC AB 3-0 X1 27 (SUTURE) IMPLANT
SUT VICRYL 4-0 PS2 18IN ABS (SUTURE) IMPLANT
SUTURE E-PAK OPEN HEART (SUTURE) ×3 IMPLANT
SYSTEM SAHARA CHEST DRAIN ATS (WOUND CARE) ×5 IMPLANT
Supple Peri-Gaurd Pericardium 4cm x 4cm (Patch) ×2 IMPLANT
TOWEL OR 17X24 6PK STRL BLUE (TOWEL DISPOSABLE) ×6 IMPLANT
TOWEL OR 17X26 10 PK STRL BLUE (TOWEL DISPOSABLE) ×6 IMPLANT
TRAP SPECIMEN MUCOUS 40CC (MISCELLANEOUS) ×2 IMPLANT
TRAY FOLEY IC TEMP SENS 14FR (CATHETERS) ×3 IMPLANT
TUBE FEEDING 8FR 16IN STR KANG (MISCELLANEOUS) ×3 IMPLANT
TUBE SUCT INTRACARD DLP 20F (MISCELLANEOUS) ×3 IMPLANT
TUBING INSUFFLATION 10FT LAP (TUBING) ×3 IMPLANT
UNDERPAD 30X30 INCONTINENT (UNDERPADS AND DIAPERS) ×3 IMPLANT
WATER STERILE IRR 1000ML POUR (IV SOLUTION) ×6 IMPLANT

## 2011-07-07 NOTE — Progress Notes (Signed)
*  PRELIMINARY RESULTS* Echocardiogram 2D Echocardiogram has been performed.  Clide Deutscher 07/07/2011, 1:40 PM

## 2011-07-07 NOTE — Brief Op Note (Addendum)
07/07/2011  7:05 PM  PATIENT:  [name]  78 y.o. female  PRE-OPERATIVE DIAGNOSIS: 1. Ventricular septal defect (secondary to MI) 2.Acute myocardial infarction 3.Multivessel CAD  POST-OPERATIVE DIAGNOSIS: 1. Ventricular septal defect (secondary to MI) 2.Acute myocardial infarction 3.Multivessel CAD PROCEDURE: EMERGENT CORONARY ARTERY BYPASS GRAFTING (CABG)x 2 (SVG sequentially to LAD and Diagonal 1) with EVH from left thigh; VENTRICULAR SEPTAL DEFECT (VSD) REPAIR; EXCISION of  MEDIASTINAL MASS (extending into lungs and pericardium)  SURGEON: .[name], MD First Assistant: Kathlee Nations Suann Larry, MD   PHYSICIAN ASSISTANT: Doree Fudge PA-C   ANESTHESIA:   general  DRAINS: Chest Tube(s) in the  Pleural and mediastinal spaces Bilateral plural tubes and two mediastinal tubes   SPECIMEN:  Mediastinal mass   DISPOSITION OF SPECIMEN:  PATHOLOGY  COUNTS CORRECT: YES   DICTATION: .Dragon Dictation  PLAN OF CARE: Admit to inpatient   PATIENT DISPOSITION:  ICU - intubated and hemodynamically stable.   Delay start of Pharmacological VTE agent (>24hrs) due to surgical blood loss or risk of bleeding:  YES  PRE OP WEIGHT: 63 kg

## 2011-07-07 NOTE — Op Note (Signed)
Cardiac Catheterization Procedure Note  Name: [name] MRN: [id number] DOB: March 07, 1925  Procedure: Left Heart Cath, Selective Coronary Angiography, LV angiography  Indication:  Anterior MI   Procedural details: The right groin was prepped, draped, and anesthetized with 1% lidocaine. Using modified Seldinger technique, a 5 French sheath was introduced into the right femoral artery. Standard Judkins catheters were used for coronary angiography and left ventriculography. Catheter exchanges were performed over a guidewire. There were no immediate procedural complications. The patient was transferred to the post catheterization recovery area for further monitoring.  Procedural Findings: Hemodynamics:  AO  83/56 LV 86/9    Coronary angiography: Coronary dominance: right  Left mainstem:  Normal  Left anterior descending (LAD):  100% occluded in mid vessel  D1: 80% proximal  Left circumflex (LCx): normal  Right coronary artery (RCA):  40% distal and 60% PDA  Left ventriculography: Anterior and apical dyskinesis.  Basal hyperdynamic function.  Overall EF 35%.  Large VSD with dye entering the RV  Right Heart Catheterization:  MRA:  12 mmHg RV 42/13 mmHg PA: 45/20 mmHg  RA sat 49%  PA: 87%  Ao:  97%   Qp/Qs 4.8 to one left to right shunt CO:  2.59 with CI 1.58  Final Conclusions:    Recommendations:  Large anterior MI with mechanical complication of VSD.  Dr Tyrone Sage consulted as well as family including daughter and son.  Patient willing to attempt surgery IABP placed from RFA due to VSD and low CO.  Had excellent augmentation with systolic BP going from 80 to 150 mmHg.  Left IABP at full inflation with 1:2 rate due to relative tachycarida Arrangements to go to OR being made.  Foley catheter and Type and Cross for 6 units  [name] 07/07/2011, 11:54 AM

## 2011-07-07 NOTE — Transfer of Care (Signed)
Immediate Anesthesia Transfer of Care Note  Patient: [name]  Procedure(s) Performed:  CORONARY ARTERY BYPASS GRAFTING (CABG) - times two using greater saphenous vein harvested via endovascular vein harvest; VENTRICULAR SEPTAL DEFECT (VSD) REPAIR - with bovine pericardium 4cm x 4cm; EXCISION MASS - Excision of mediastinal mass  Patient Location: ICU  Anesthesia Type: General  Level of Consciousness: sedated and unresponsive  Airway & Oxygen Therapy: Patient remains intubated per anesthesia plan and Patient placed on Ventilator (see vital sign flow sheet for setting)  Post-op Assessment: Report given to PACU RN and Post -op Vital signs reviewed and stable  Post vital signs: Reviewed and stable  Complications: No apparent anesthesia complications 

## 2011-07-07 NOTE — Progress Notes (Signed)
ANTICOAGULATION CONSULT NOTE - Initial Consult  Pharmacy Consult for heparin Indication: post cath with IABP  No Known Allergies  Patient Measurements: Height: 5\' 1"  (154.9 cm) Weight: 138 lb 10.7 oz (62.9 kg) IBW/kg (Calculated) : 47.8  Heparin Dosing Weight: 63kg  Vital Signs: Temp: 97.6 F (36.4 C) (02/01 1224) Temp src: Oral (02/01 1224) BP: 132/84 mmHg (02/01 1224) Pulse Rate: 91  (02/01 1224)  Labs:  Basename 07/07/11 1055  HGB 12.7  HCT 36.8  PLT 311  APTT 43*  LABPROT 14.5  INR 1.11  HEPARINUNFRC --  CREATININE 1.07  CKTOTAL 1798*  CKMB 205.2*  TROPONINI >25.00*   Estimated Creatinine Clearance: 33.2 ml/min (by C-G formula based on Cr of 1.07).  Medical History: Past Medical History  Diagnosis Date  . Acute MI, anterior wall     a. 07/07/2011  . HTN (hypertension)   . Osteoarthritis of knee     Bilateral.  a. s/p LTKA ~2007;  b. s/p RTKA 10/2010  . CAD (coronary artery disease)     a. 07/07/2011 - Occluded LAD  . VSD (ventricular septal defect)     a.  07/07/2011 - Noted @ time of MI    Medications:  Prescriptions prior to admission  Medication Sig Dispense Refill  . carvedilol (COREG) 3.125 MG tablet Take 3.125 mg by mouth daily with breakfast.      . hydrochlorothiazide (MICROZIDE) 12.5 MG capsule Take 12.5 mg by mouth daily.      . Ibuprofen-Diphenhydramine HCl (ADVIL PM) 200-25 MG CAPS Take 1 capsule by mouth at bedtime.        Assessment: 78 yo female s/p cath with  with plans for emergent OHS   Goal of Therapy:  Heparin level=0.2=0.5   Plan:  - No heparin bolus -Will start infusion at 750 units/hr (12 units/kg/hr) and follow post OR  Benny Lennert 07/07/2011,1:23 PM

## 2011-07-07 NOTE — Transfer of Care (Signed)
Immediate Anesthesia Transfer of Care Note  Patient: [name]  Procedure(s) Performed:  CORONARY ARTERY BYPASS GRAFTING (CABG) - times two using greater saphenous vein harvested via endovascular vein harvest; VENTRICULAR SEPTAL DEFECT (VSD) REPAIR - with bovine pericardium 4cm x 4cm; EXCISION MASS - Excision of mediastinal mass  Patient Location: ICU  Anesthesia Type: General  Level of Consciousness: sedated and unresponsive  Airway & Oxygen Therapy: Patient remains intubated per anesthesia plan and Patient placed on Ventilator (see vital sign flow sheet for setting)  Post-op Assessment: Report given to PACU RN and Post -op Vital signs reviewed and stable  Post vital signs: Reviewed and stable  Complications: No apparent anesthesia complications

## 2011-07-07 NOTE — Preoperative (Signed)
Beta Blockers   Reason not to administer Beta Blockers:Hold beta blocker due to hypotension, Hold beta blocker due to hypovolemia 

## 2011-07-07 NOTE — Anesthesia Postprocedure Evaluation (Signed)
  Anesthesia Post-op Note  Patient: [name]  Procedure(s) Performed:  CORONARY ARTERY BYPASS GRAFTING (CABG) - times two using greater saphenous vein harvested via endovascular vein harvest; VENTRICULAR SEPTAL DEFECT (VSD) REPAIR - with bovine pericardium 4cm x 4cm; EXCISION MASS - Excision of mediastinal mass  Patient Location: ICU  Anesthesia Type: General  Level of Consciousness: sedated and unresponsive  Airway and Oxygen Therapy: Patient remains intubated per anesthesia plan  Post-op Pain: none  Post-op Assessment: Post-op Vital signs reviewed  Post-op Vital Signs: Reviewed and stable  Complications: No apparent anesthesia complications

## 2011-07-07 NOTE — H&P (Signed)
Patient ID: [name] MRN: [id number], DOB/AGE: 1924/10/08   Admit date: (Not on file)   Primary Physician: Pixie Casino Primary Cardiologist: P. Nishan - new  Pt. Profile:   78 y/o female w/o prior cardiac hx who presents with acute ant stemi.  Problem List: Past Medical History  Diagnosis Date  . Acute MI, anterior wall     a. 07/07/2011  . HTN (hypertension)   . Osteoarthritis of knee     Bilateral.  a. s/p LTKA ~2007;  b. s/p RTKA 10/2010  . CAD (coronary artery disease)     a. 07/07/2011 - Occluded LAD  . VSD (ventricular septal defect)     a.  07/07/2011 - Noted @ time of MI    Past Surgical History  Procedure Date  . Total knee arthroplasty     a.  Left ~ 2007, Right 10/2010     Allergies: Allergies not on file  HPI:   78 y/o female with only prior h/o HTN and OA s/p bilat knee replacements.  She was in her usoh until approx 3 wks ago when she began to experience rest and exertional sscp assoc with dyspnea.  Last night at approx 8:30p pt developed sudden onset of sscp assoc with nausea, vomiting, and dyspnea.  Ss persisted throughout the night and moved to the left upper back.  She had a restless night and this am presented to her PCP's office where an ecg was performed and showed antlat st elevation.  Code STEMI was called an pt was taken emergently to [facility] cath lab.  She cont to c/o 2/10 chest pain but is otw hemodynamically stable.   Home Medications "BP med" @ home  FH Noncontributory given advanced age.  History   Social History  . Marital Status: Married    Spouse Name: N/A    Number of Children: N/A  . Years of Education: N/A   Occupational History  . Not on file.   Social History Main Topics  . Smoking status: Never Smoker   . Smokeless tobacco: Never Used  . Alcohol Use: No  . Drug Use: No  . Sexually Active: Not on file   Other Topics Concern  . Not on file   Social History Narrative   Pt lives in [place] with her husband of 60 yrs.  Her  husband has Parkinson's and she is his primary care giver.       Review of Systems: General: negative for chills, fever, night sweats or weight changes.  Cardiovascular: +++ for chest pain, dyspnea on exertion, n/v since last night.  No edema, orthopnea, palpitations, paroxysmal nocturnal dyspnea. Dermatological: negative for rash Respiratory: negative for cough or wheezing Urologic: negative for hematuria Abdominal:+++ for nausea, vomiting.  No diarrhea, bright red blood per rectum, melena, or hematemesis Neurologic: negative for visual changes, syncope, or dizziness All other systems reviewed and are otherwise negative except as noted above.  Physical Exam: Afebrile, 107, 20, 109/70, spo2 100% 2lpm General: Well developed, well nourished, in no acute distress. - c/o 2/10 chest pain Head: Normocephalic, atraumatic, sclera non-icteric, no xanthomas, nares are without discharge.  Neck: Supple without bruits or JVD. Lungs:  Resp regular and unlabored, CTA. Heart: RRR  - distant. Soft systolic murmur noted @ llsb. no s3, s4. Abdomen: Soft, non-tender, non-distended, BS + x 4.  Msk:  Strength and tone appears normal for age. Extremities: No clubbing, cyanosis or edema. DP/PT/Radials 2+ and equal bilaterally. Neuro: Alert and oriented X 3.  Moves all extremities spontaneously. Psych: Normal affect.   Labs:  Pending   Radiology/Studies: No results found.  EKG:  RSR, 105 3-22mm ant/lat st elevation (V2-V6) with less pronounced st elevation in inf leads.  ASSESSMENT AND PLAN:   1.  Acute Inferior STEMI/CAD:  3 wk h/o of intermittent chest pain that worsened last night.  ECG today shows antlat st elevation.  Cath has shown occluded LAD along with Apical VSD.  RV sat >80.  Surgical consultation pending. IABP placed - add heparin.  Treat pain with MSO4. Avoid BB in setting of borderline hypotension.   2.  VSD:  Ischemic.  Surgical eval as above.  3.  HTN:    Stable.   Signed, Nicolasa Ducking, NP 07/07/2011, 11:18 AM'

## 2011-07-07 NOTE — Progress Notes (Signed)
Paged for Code STEMI. Chaplain visited with patient's daughter, [name], offered her emotional support, and prayed with her. Chaplain also assisted her with tracking down phone numbers of friends and family. Chaplain plans to follow up with family.

## 2011-07-07 NOTE — OR Nursing (Signed)
Late Entry:  2122  Incorrect needle count, Dr. Tyrone Sage aware, policy and procedure followed,  X-Ray was obtained.  X-ray report called back at 2100 report that no needle or foreign object found per Dr. Jena Gauss.  Cecil Cobbs.

## 2011-07-07 NOTE — Anesthesia Preprocedure Evaluation (Addendum)
Anesthesia Evaluation  Patient identified by MRN, date of birth, ID band Patient awake    Reviewed: Allergy & Precautions, H&P , NPO status , Patient's Chart, lab work & pertinent test results, reviewed documented beta blocker date and time   Airway Mallampati: II TM Distance: <3 FB Neck ROM: full    Dental  (+) Teeth Intact   Pulmonary    Pulmonary exam normal       Cardiovascular hypertension, On Medications + CAD and + Past MI regular  Emergent CABG from Cath lab VSD AMI LAD lesion    Neuro/Psych Negative Neurological ROS     GI/Hepatic negative GI ROS, Neg liver ROS,   Endo/Other    Renal/GU negative Renal ROS  Genitourinary negative   Musculoskeletal   Abdominal   Peds  Hematology negative hematology ROS (+)   Anesthesia Other Findings   Reproductive/Obstetrics                         Anesthesia Physical Anesthesia Plan  ASA: IV and Emergent  Anesthesia Plan: General   Post-op Pain Management:    Induction: Intravenous  Airway Management Planned: Oral ETT  Additional Equipment: Arterial line, CVP, PA Cath and TEE  Intra-op Plan:   Post-operative Plan: Post-operative intubation/ventilation  Informed Consent: I have reviewed the patients History and Physical, chart, labs and discussed the procedure including the risks, benefits and alternatives for the proposed anesthesia with the patient or authorized representative who has indicated his/her understanding and acceptance.   Dental advisory given  Plan Discussed with: CRNA, Anesthesiologist and Surgeon  Anesthesia Plan Comments:         Anesthesia Quick Evaluation

## 2011-07-07 NOTE — OR Nursing (Signed)
First SICU call [phone]; Second SICU call [phone]

## 2011-07-07 NOTE — H&P (Signed)
Acute anterior MI No previous cardiac history PA to do H&P as urgent cath to be started  [name] 07/07/2011 10:42 AM

## 2011-07-07 NOTE — Consults (Addendum)
[address]            [place] [address]          [phone]       TYRESE FICEK [facility] Medical Record #[id number] Date of Birth: [birth year]  Referring: P. Nishan Primary Care: Pixie Casino  Chief Complaint:   Acute anterior STEMI   History of Present Illness:     The patient is an 78 year old female with a history of hypertension who presented to the ER this morning with acute onset chest pain. She had been in her usual state of health until 3 weeks ago, when she developed substernal chest pain both at rest and with exertion, and associated with dyspnea.  Last night, the patient developed acute onset chest pain with associated nausea, vomiting, and dyspnea.  The pain persisted throughout the night, and radiated to the left upper back.  She was seen this morning in her primary care physician's office where an EKG showed anterolateral ST elevation.  The patient was brought to the ER, and a Code STEMI was called.  She was taken directly to the cath lab for catheterization by Dr. Eden Emms, which showed 100% occlusion of the LAD, 80% proximal D1, normal circumflex, 40% distal RCA and 60% PDA.  EF was 35%.  There was a large post-infarction VSD.  Cardiac surgery was consulted to evaluate for emergency CABG/VSD repair.     Current Activity/ Functional Status: Patient is independent with mobility/ambulation, transfers, ADL's, IADL's.   Past Medical History  Diagnosis Date  . Acute MI, anterior wall     a. 07/07/2011  . HTN (hypertension)   . Osteoarthritis of knee     Bilateral.  a. s/p LTKA ~2007;  b. s/p RTKA 10/2010  . CAD (coronary artery disease)     a. 07/07/2011 - Occluded LAD  . VSD (ventricular septal defect)     a.  07/07/2011 - Noted @ time of MI    Past Surgical History  Procedure Date  . Total knee arthroplasty     a.  Left ~ 2007, Right 10/2010   Cataract surgery  History  Smoking status  . Never Smoker   Smokeless tobacco  . Never  Used    History  Alcohol Use No    History   Social History  . Marital Status: Married    Spouse Name: N/A    Number of Children: N/A  . Years of Education: N/A   Occupational History  . Not on file.   Social History Main Topics  . Smoking status: Never Smoker   . Smokeless tobacco: Never Used  . Alcohol Use: No  . Drug Use: No  . Sexually Active: Not on file   Other Topics Concern  . Not on file   Social History Narrative   Pt lives in [place] with her husband of 60 yrs.  Her husband has Parkinson's and she is his primary care giver.      Allergies not on file  Given Loading dose of angiomax    No prescriptions prior to admission    Family History: Father died age 52 Mother died in her 90s   Review of Systems:     Cardiac Review of Systems: Y or N  Chest Pain [ y   ]  Resting SOB [ y  ] Exertional SOB  [ y ]  Orthopnea [  n]   Pedal Edema Milo.Brash   ]    Palpitations [ n ] Syncope  [ n ]   Presyncope [n   ]  General Review of Systems: [Y] = yes [  ]=no Constitional: recent weight change [  ]; anorexia [  ]; fatigue [  ]; nausea [ y ]; night sweats [  ]; fever [ y ]; or chills [  y];                                                                                                                                          Dental: poor dentition[  ]; .   Eye : blurred vision [  ]; diplopia [   ]; vision changes [  ];  Amaurosis fugax[  ]; Resp: cough [  ];  wheezing[  ];  hemoptysis[  ]; shortness of breath[ y ]; paroxysmal nocturnal dyspnea[  ]; dyspnea on exertion[  ]; or orthopnea[  ];  GI:  gallstones[  ], vomiting[  ];  dysphagia[  ]; melena[  ];  hematochezia [  ]; heartburn[  ];   Hx of  Colonoscopy[  ]; GU: kidney stones [  ]; hematuria[  ];   dysuria [  ];  nocturia[  ];  history of     obstruction [  ];             Skin: rash, swelling[  ];, hair loss[  ];  peripheral edema[  ];  or itching[  ]; Musculosketetal: myalgias[  ];  joint swelling[ y ];  joint erythema[   ];  joint pain[  ];  back pain[ y ];  Heme/Lymph: bruising[  ];  bleeding[  ];  anemia[  ];  Neuro: TIA[  ];  headaches[  ];  stroke[  ];  vertigo[  ];  seizures[  ];   paresthesias[  ];  difficulty walking[  ];  Psych:depression[  ]; anxiety[  ];  Endocrine: diabetes[  ];  thyroid dysfunction[  ];  Immunizations: Flu [  ]; Pneumococcal[  ];  Other:  Physical Exam: BP 132/84  Pulse 91  Temp(Src) 97.6 F (36.4 C) (Oral)  Resp 20  Ht 5\' 1"  (1.549 m)  Wt 62.9 kg (138 lb 10.7 oz)  BMI 26.20 kg/m2  SpO2 98%  Awake and alert in cath lab  No murmur , no carotid bruits Ab soft  Not tenderness Cath in rt groin adequate vein for bypass  Diagnostic Studies & Laboratory data:     Recent Radiology Findings:   Dg Chest Port 1 View  07/07/2011  *RADIOLOGY REPORT*  Clinical Data: No acute myocardial infarction.  Preop for CABG.  PORTABLE CHEST - 1 VIEW  Comparison: 10/18/2010  Findings: Heart size remains within normal limits.  Diffuse interstitial infiltrates are seen bilaterally, consistent with mild interstitial edema.  No evidence of pulmonary  consolidation or pleural effusion.  Intra-aortic balloon pump is seen with tip at the level of the aortic arch.  IMPRESSION: Mild diffuse pulmonary interstitial edema.  Original Report Authenticated By: Danae Orleans, M.D.     Recent Lab Findings: Lab Results  Component Value Date   WBC 18.5* 07/07/2011   HGB 12.7 07/07/2011   HCT 36.8 07/07/2011   PLT 311 07/07/2011   GLUCOSE 228* 07/07/2011   CHOL 173 07/07/2011   TRIG 71 07/07/2011   HDL 57 07/07/2011   LDLCALC 409* 07/07/2011   ALT 41* 07/07/2011   AST 146* 07/07/2011   NA 135 07/07/2011   K 3.5 07/07/2011   CL 100 07/07/2011   CREATININE 1.07 07/07/2011   BUN 28* 07/07/2011   CO2 19 07/07/2011   INR 1.11 07/07/2011   Lab Results  Component Value Date   CKTOTAL 2252* 07/07/2011   CKMB 251.5* 07/07/2011   TROPONINI 24.29* 07/07/2011      Assessment / Plan:      Patient seen . in the cath lab and agreed with the  need for surgery.  All risks, benefits and alternatives were explained to the patient, and she agreed to proceed.  She was loaded with Angiomax while in the cath lab, and an intra-aortic balloon pump was placed.  She will be transferred to CCU for pre-op workup, and we will plan to proceed this afternoon with emergency CABG and repair of acute post-infarction VSD.    The goals risks and alternatives of the planned surgical procedure CABG repair VSD post infarct  have been discussed with the patient in detail and her daughter  And son (on telephone). The risks of the procedure including death, infection, stroke, myocardial infarction, bleeding, blood transfusion have all been discussed specifically.  I have quoted Vonda T Lothamer a 25% of perioperative mortality and a complication rate as high as50 %. The patient's questions have been answered.Kathyrn T Espana is willing  to proceed with the planned procedure.      [name] 07/07/2011 12:52 PM

## 2011-07-08 ENCOUNTER — Inpatient Hospital Stay (HOSPITAL_COMMUNITY): Payer: Medicare Other

## 2011-07-08 DIAGNOSIS — C801 Malignant (primary) neoplasm, unspecified: Secondary | ICD-10-CM

## 2011-07-08 HISTORY — DX: Malignant (primary) neoplasm, unspecified: C80.1

## 2011-07-08 LAB — LIPID PANEL
LDL Cholesterol: 15 mg/dL (ref 0–99)
VLDL: 10 mg/dL (ref 0–40)

## 2011-07-08 LAB — GLUCOSE, CAPILLARY
Glucose-Capillary: 111 mg/dL — ABNORMAL HIGH (ref 70–99)
Glucose-Capillary: 113 mg/dL — ABNORMAL HIGH (ref 70–99)
Glucose-Capillary: 114 mg/dL — ABNORMAL HIGH (ref 70–99)
Glucose-Capillary: 115 mg/dL — ABNORMAL HIGH (ref 70–99)
Glucose-Capillary: 124 mg/dL — ABNORMAL HIGH (ref 70–99)
Glucose-Capillary: 127 mg/dL — ABNORMAL HIGH (ref 70–99)
Glucose-Capillary: 129 mg/dL — ABNORMAL HIGH (ref 70–99)
Glucose-Capillary: 135 mg/dL — ABNORMAL HIGH (ref 70–99)
Glucose-Capillary: 138 mg/dL — ABNORMAL HIGH (ref 70–99)
Glucose-Capillary: 140 mg/dL — ABNORMAL HIGH (ref 70–99)
Glucose-Capillary: 144 mg/dL — ABNORMAL HIGH (ref 70–99)
Glucose-Capillary: 148 mg/dL — ABNORMAL HIGH (ref 70–99)
Glucose-Capillary: 161 mg/dL — ABNORMAL HIGH (ref 70–99)
Glucose-Capillary: 84 mg/dL (ref 70–99)
Glucose-Capillary: 84 mg/dL (ref 70–99)
Glucose-Capillary: 85 mg/dL (ref 70–99)
Glucose-Capillary: 88 mg/dL (ref 70–99)

## 2011-07-08 LAB — CBC
HCT: 26.4 % — ABNORMAL LOW (ref 36.0–46.0)
MCV: 84.3 fL (ref 78.0–100.0)
Platelets: 89 10*3/uL — ABNORMAL LOW (ref 150–400)
RBC: 3.13 MIL/uL — ABNORMAL LOW (ref 3.87–5.11)
RBC: 2.73 MIL/uL — ABNORMAL LOW (ref 3.87–5.11)
RDW: 14.2 % (ref 11.5–15.5)
RDW: 14.1 % (ref 11.5–15.5)
WBC: 16.5 10*3/uL — ABNORMAL HIGH (ref 4.0–10.5)
WBC: 21 10*3/uL — ABNORMAL HIGH (ref 4.0–10.5)

## 2011-07-08 LAB — POCT I-STAT 3, ART BLOOD GAS (G3+)
Acid-base deficit: 5 mmol/L — ABNORMAL HIGH (ref 0.0–2.0)
Bicarbonate: 19.7 mEq/L — ABNORMAL LOW (ref 20.0–24.0)
O2 Saturation: 99 %
Patient temperature: 37.1
TCO2: 21 mmol/L (ref 0–100)
pCO2 arterial: 36.2 mmHg (ref 35.0–45.0)
pH, Arterial: 7.344 — ABNORMAL LOW (ref 7.350–7.400)
pO2, Arterial: 127 mmHg — ABNORMAL HIGH (ref 80.0–100.0)

## 2011-07-08 LAB — POCT I-STAT, CHEM 8
BUN: 31 mg/dL — ABNORMAL HIGH (ref 6–23)
Calcium, Ion: 1.16 mmol/L (ref 1.12–1.32)
Chloride: 110 mEq/L (ref 96–112)
Creatinine, Ser: 1.1 mg/dL (ref 0.50–1.10)
Glucose, Bld: 126 mg/dL — ABNORMAL HIGH (ref 70–99)
HCT: 20 % — ABNORMAL LOW (ref 36.0–46.0)
Hemoglobin: 6.8 g/dL — CL (ref 12.0–15.0)
Potassium: 4.4 mEq/L (ref 3.5–5.1)
Sodium: 138 mEq/L (ref 135–145)
TCO2: 18 mmol/L (ref 0–100)

## 2011-07-08 LAB — BASIC METABOLIC PANEL
BUN: 25 mg/dL — ABNORMAL HIGH (ref 6–23)
CO2: 21 mEq/L (ref 19–32)
Chloride: 112 mEq/L (ref 96–112)
Creatinine, Ser: 0.89 mg/dL (ref 0.50–1.10)
GFR calc Af Amer: 67 mL/min — ABNORMAL LOW (ref >90)
Potassium: 4 mEq/L (ref 3.5–5.1)

## 2011-07-08 LAB — CREATININE, SERUM
Creatinine, Ser: 1.24 mg/dL — ABNORMAL HIGH (ref 0.50–1.10)
GFR calc Af Amer: 45 mL/min — ABNORMAL LOW (ref >90)

## 2011-07-08 LAB — MAGNESIUM: Magnesium: 3 mg/dL — ABNORMAL HIGH (ref 1.5–2.5)

## 2011-07-08 LAB — CK TOTAL AND CKMB (NOT AT ARMC)
CK, MB: 313.2 ng/mL (ref 0.3–4.0)
Relative Index: 11.3 — ABNORMAL HIGH (ref 0.0–2.5)

## 2011-07-08 MED ORDER — INSULIN ASPART 100 UNIT/ML ~~LOC~~ SOLN
0.0000 [IU] | SUBCUTANEOUS | Status: DC
  Administered 2011-07-08 (×2): 2 [IU] via SUBCUTANEOUS
  Administered 2011-07-09: 4 [IU] via SUBCUTANEOUS
  Administered 2011-07-09 – 2011-07-12 (×8): 2 [IU] via SUBCUTANEOUS
  Administered 2011-07-12 (×2): 4 [IU] via SUBCUTANEOUS
  Administered 2011-07-12: 2 [IU] via SUBCUTANEOUS
  Administered 2011-07-13 (×2): 4 [IU] via SUBCUTANEOUS
  Administered 2011-07-13: 8 [IU] via SUBCUTANEOUS
  Administered 2011-07-14 (×6): 2 [IU] via SUBCUTANEOUS
  Filled 2011-07-08 (×2): qty 3

## 2011-07-08 MED ORDER — SODIUM CHLORIDE 0.9 % IJ SOLN
10.0000 mL | INTRAMUSCULAR | Status: DC | PRN
  Administered 2011-07-08: 10 mL via INTRAVENOUS

## 2011-07-08 MED ORDER — SODIUM CHLORIDE 0.9 % IJ SOLN
10.0000 mL | Freq: Two times a day (BID) | INTRAMUSCULAR | Status: DC
  Administered 2011-07-08 – 2011-07-16 (×15): 10 mL via INTRAVENOUS

## 2011-07-08 MED ORDER — CHLORHEXIDINE GLUCONATE 0.12 % MT SOLN
OROMUCOSAL | Status: AC
  Administered 2011-07-08: 15 mL
  Filled 2011-07-08: qty 15

## 2011-07-08 NOTE — Progress Notes (Signed)
Patient ID: [name], female   DOB: [birth year], 78 y.o.   MRN: [id number] TCTS DAILY PROGRESS NOTE                   [address]            [place] [address]          [phone]      1 Day Post-Op Procedure(s) (LRB): CORONARY ARTERY BYPASS GRAFTING (CABG) (N/A) VENTRICULAR SEPTAL DEFECT (VSD) REPAIR (N/A) EXCISION MASS (N/A)  Total Length of Stay:  LOS: 1 day   Subjective: More alert , follows commands and knows family  Objective: Vital signs in last 24 hours: Temp:  [93.6 F (34.2 C)-99.1 F (37.3 C)] 99.1 F (37.3 C) (02/02 1800) Pulse Rate:  [65-93] 89  (02/02 1800) Cardiac Rhythm:  [-] Atrial paced (02/02 0800) Resp:  [12-29] 20  (02/02 1800) BP: (89-120)/(23-62) 99/41 mmHg (02/02 1730) SpO2:  [96 %-100 %] 100 % (02/02 1800) Arterial Line BP: (75-130)/(22-45) 100/31 mmHg (02/02 1800) FiO2 (%):  [49.3 %-50.8 %] 49.8 % (02/02 1800) Weight:  [138 lb 14.2 oz (63 kg)-161 lb 6 oz (73.2 kg)] 161 lb 6 oz (73.2 kg) (02/02 0515)  Filed Weights   07/07/11 1224 07/07/11 2130 07/08/11 0515  Weight: 138 lb 10.7 oz (62.9 kg) 138 lb 14.2 oz (63 kg) 161 lb 6 oz (73.2 kg)    Weight change:    Hemodynamic parameters for last 24 hours: PAP: (22-34)/(14-21) 31/17 mmHg CO:  [2.8 L/min-4.5 L/min] 3.4 L/min CI:  [1.7 L/min/m2-2.7 L/min/m2] 2 L/min/m2  Intake/Output from previous day: 02/01 0701 - 02/02 0700 In: 6775.9 [I.V.:5164.9; Blood:931; NG/GT:100; IV Piggyback:580] Out: 3036 [Urine:1106; Blood:750; Chest Tube:1180]     Continuous Infusions:   . sodium chloride 20 mL/hr at 07/08/11 1700  . sodium chloride 20 mL/hr at 07/08/11 1700  . sodium chloride    . dexmedetomidine (PRECEDEX) IV infusion 0.3 mcg/kg/hr (07/08/11 1908)  . DOPamine 3 mcg/kg/min (07/08/11 1700)  . insulin (NOVOLIN-R) infusion 0.8 Units/hr (07/08/11 1318)  . lactated ringers 20 mL/hr at 07/08/11 1700  . milrinone 0.3 mcg/kg/min (07/08/11 1700)  . nitroGLYCERIN 16.667 mcg/min  (07/07/11 2145)  . phenylephrine (NEO-SYNEPHRINE) Adult infusion 75 mcg/min (07/08/11 1755)  . DISCONTD: heparin 750 Units/hr (07/07/11 1406)   PRN Meds:.albumin human, lactated ringers, metoprolol, midazolam, morphine injection, morphine, ondansetron (ZOFRAN) IV, oxyCODONE, sodium chloride, sodium chloride, DISCONTD: sodium chloride, DISCONTD: 0.9 % irrigation (POUR BTL), DISCONTD: acetaminophen, DISCONTD: ALPRAZolam, DISCONTD: hemostatic agents, DISCONTD: hemostatic agents, DISCONTD:  morphine injection, DISCONTD: nitroGLYCERIN, DISCONTD: ondansetron (ZOFRAN) IV, DISCONTD: sodium chloride DISCONTD: Surgifoam 1 Gm with 0.9% sodium chloride (4 ml) topical solution    Lab Results: CBC: Basename 07/08/11 1700 07/08/11 0345  WBC 21.0* 16.5*  HGB 8.1* 9.0*  HCT 22.6* 26.4*  PLT 89* 133*   BMET:  Basename 07/08/11 0345 07/07/11 2143 07/07/11 1055  NA 139 144 --  K 4.0 2.8* --  CL 112 -- 100  CO2 21 -- 19  GLUCOSE 142* 88 --  BUN 25* -- 28*  CREATININE 0.89 -- 1.07  CALCIUM 7.9* -- 9.1    PT/INR:  Basename 07/07/11 2135  LABPROT 23.6*  INR 2.06*   Radiology: Dg Chest Portable 1 View In Am  07/08/2011  *RADIOLOGY REPORT*  Clinical Data: Postoperative patient.  Intubated patient.  PORTABLE CHEST - 1 VIEW  Comparison: 07/07/2011.  Findings: Endotracheal tube and enteric tube appear in good position although the tip of the  enteric tube is not visualized.  Right IJ vascular sheath transmits a Swan-Ganz catheter with the tip in the pulmonary outflow tract.  Intra-aortic balloon pump is present with the marker for the tip 5 cm inferior to the tip of the aortic arch.  Right thoracostomy tube and mediastinal drain.  Left thoracostomy tube.  No pneumothorax.  Interval development of opacity at the right apex.  This does not have the typical appearance for mucous plugging or collapse and is suspicious for hematoma or hemothorax.  Correlation with output from the of right chest drain is recommended.  CABG changes noted.  IMPRESSION:  1.  Endotracheal tube, nasogastric tube and Swan-Ganz catheter appear in good position. 2.  Intra-aortic balloon pump with the tip in the descending thoracic aorta. 3.  Bilateral thoracostomy tubes and mediastinal drain. 4.  Interval development of right apical density, suspicious for hemorrhage/hematoma.  Correlation with right thoracostomy drain output is recommended.  This is a call report  Original Report Authenticated By: Andreas Newport, M.D.   Dg Chest Portable 1 View  07/07/2011  *RADIOLOGY REPORT*  Clinical Data: Incorrect needle count.  PORTABLE CHEST - 1 VIEW  Comparison: 07/07/2011 at 1:35 p.m.  Findings: Endotracheal tube, Swan-Ganz catheter, and chest tubes appear in good position.  Aortic balloon pump tip is in the descending thoracic aorta.  No pneumothorax.  Minimal edema in both lungs.  IMPRESSION: Minimal interstitial edema.  No retained needles. Tubes and lines appear in good position.  Original Report Authenticated By: Gwynn Burly, M.D.   Dg Chest Port 1 View  07/07/2011  *RADIOLOGY REPORT*  Clinical Data: No acute myocardial infarction.  Preop for CABG.  PORTABLE CHEST - 1 VIEW  Comparison: 10/18/2010  Findings: Heart size remains within normal limits.  Diffuse interstitial infiltrates are seen bilaterally, consistent with mild interstitial edema.  No evidence of pulmonary consolidation or pleural effusion.  Intra-aortic balloon pump is seen with tip at the level of the aortic arch.  IMPRESSION: Mild diffuse pulmonary interstitial edema.  Original Report Authenticated By: Danae Orleans, M.D.     Assessment/Plan: S/P Procedure(s) (LRB): CORONARY ARTERY BYPASS GRAFTING (CABG) (N/A) VENTRICULAR SEPTAL DEFECT (VSD) REPAIR (N/A) EXCISION MASS (N/A) Repeat chest xray Transfuse one unit of prbc with BP and need for IAB and pressor     [name] MD  Beeper [phone] Office [phone] 07/08/2011 7:09 PM

## 2011-07-08 NOTE — Progress Notes (Signed)
CBG:64 Treatment: D50 IV 25 mL  Symptoms: None  Follow-up CBG: Time:2320 CBG Result:114  Possible Reasons for Event: Unknown  Comments/MD notified Glucose stabilizer dosed 14cc D50 and insulin drip held x    Jeri Modena

## 2011-07-08 NOTE — Progress Notes (Signed)
Patient ID: [name], female   DOB: [birth year], 78 y.o.   MRN: [id number] TCTS DAILY PROGRESS NOTE                   [address]            [place] [address]          [phone]      1 Day Post-Op Procedure(s) (LRB): CORONARY ARTERY BYPASS GRAFTING (CABG) (N/A) VENTRICULAR SEPTAL DEFECT (VSD) REPAIR (N/A) EXCISION MASS (N/A)  Total Length of Stay:  LOS: 1 day   Subjective: Sedated on ventilator with IAB in place  Objective: Vital signs in last 24 hours: Temp:  [93.6 F (34.2 C)-99.9 F (37.7 C)] 98.2 F (36.8 C) (02/02 0747) Pulse Rate:  [83-102] 89  (02/02 0747) Cardiac Rhythm:  [-] Atrial paced (02/02 0800) Resp:  [12-24] 19  (02/02 0747) BP: (104-142)/(23-94) 104/23 mmHg (02/02 0747) SpO2:  [98 %-100 %] 100 % (02/02 0747) Arterial Line BP: (95-130)/(22-45) 114/25 mmHg (02/02 0700) FiO2 (%):  [49.3 %-50.8 %] 50 % (02/02 0747) Weight:  [138 lb 10.7 oz (62.9 kg)-161 lb 6 oz (73.2 kg)] 161 lb 6 oz (73.2 kg) (02/02 0515)  Filed Weights   07/07/11 1224 07/07/11 2130 07/08/11 0515  Weight: 138 lb 10.7 oz (62.9 kg) 138 lb 14.2 oz (63 kg) 161 lb 6 oz (73.2 kg)    Weight change:    Hemodynamic parameters for last 24 hours: PAP: (22-34)/(14-21) 30/18 mmHg CO:  [3.2 L/min-4.5 L/min] 3.2 L/min CI:  [1.9 L/min/m2-2.7 L/min/m2] 1.9 L/min/m2  Intake/Output from previous day: 02/01 0701 - 02/02 0700 In: 6775.9 [I.V.:5164.9; Blood:931; NG/GT:100; IV Piggyback:580] Out: 3036 [Urine:1106; Blood:750; Chest Tube:1180]  Intake/Output this shift: Total I/O In: 161.9 [I.V.:131.9; NG/GT:30] Out: 160 [Urine:10; Chest Tube:150]  Current Meds: Scheduled Meds:   . acetaminophen (TYLENOL) oral liquid 160 mg/5 mL  650 mg Per Tube NOW   Or  . acetaminophen  650 mg Rectal NOW  . acetaminophen  1,000 mg Oral Q6H   Or  . acetaminophen (TYLENOL) oral liquid 160 mg/5 mL  975 mg Per Tube Q6H  . aspirin EC  325 mg Oral Daily   Or  . aspirin  324 mg Per Tube  Daily  . bisacodyl  10 mg Oral Daily   Or  . bisacodyl  10 mg Rectal Daily        . cefUROXime (ZINACEF)  IV  1.5 g Intravenous Q12H  . chlorhexidine      . dextrose      . docusate sodium  200 mg Oral Daily  . famotidine (PEPCID) IV  20 mg Intravenous Q12H  . heparin      . heparin      . insulin regular  0-10 Units Intravenous TID WC  . lidocaine      . lidocaine      . magnesium sulfate  4 g Intravenous Once  . metoprolol tartrate  12.5 mg Oral BID   Or  . metoprolol tartrate  12.5 mg Per Tube BID  . nitroGLYCERIN      . nitroglycerin/verapamil/heparin/sodium bicarbonate solution irrigation for artery spasm   Irrigation To OR  . pantoprazole  40 mg Oral Q1200  . potassium chloride  10 mEq Intravenous Q1 Hr x 3  . rosuvastatin  40 mg Oral q1800  . sodium chloride  10 mL Intravenous Q12H  . sodium chloride  3 mL Intravenous Q12H  . vancomycin (VANCOCIN)  IVPB 1000 mg/100 mL central line  1,000 mg Intravenous Once              Continuous Infusions:   . sodium chloride 20 mL/hr at 07/08/11 0800  . sodium chloride 20 mL/hr at 07/08/11 0800  . sodium chloride    . dexmedetomidine (PRECEDEX) IV infusion 0.5 mcg/kg/hr (07/08/11 0800)  . DOPamine 3 mcg/kg/min (07/08/11 0800)  . insulin (NOVOLIN-R) infusion 2.3 Units/hr (07/08/11 0800)  . lactated ringers 20 mL/hr at 07/08/11 0800  . milrinone 0.3 mcg/kg/min (07/08/11 0800)  . nitroGLYCERIN 16.667 mcg/min (07/07/11 2145)  . phenylephrine (NEO-SYNEPHRINE) Adult infusion 70 mcg/min (07/08/11 0800)  . DISCONTD: heparin 750 Units/hr (07/07/11 1406)   PRN Meds:.albumin human, lactated ringers, metoprolol, midazolam, morphine injection, morphine, ondansetron (ZOFRAN) IV, oxyCODONE, sodium chloride, sodium chloride, DISCONTD: sodium chloride, DISCONTD: sodium chloride, General appearance: sedated, does move all extremities to command Neurologic: sedated Heart: regular rate and rhythm, S1, S2 normal, no murmur, click, rub or  gallop Lungs: diminished breath sounds bilaterally Abdomen: soft, non-tender; bowel sounds normal; no masses,  no organomegaly Extremities: doppler pulses in feet  feet viable with IAB in place  Lab Results: CBC: Basename 07/08/11 0345 07/07/11 2143 07/07/11 2135  WBC 16.5* -- 13.9*  HGB 9.0* 6.8* --  HCT 26.4* 20.0* --  PLT 133* -- 89*   BMET:  Basename 07/08/11 0345 07/07/11 2143 07/07/11 1055  NA 139 144 --  K 4.0 2.8* --  CL 112 -- 100  CO2 21 -- 19  GLUCOSE 142* 88 --  BUN 25* -- 28*  CREATININE 0.89 -- 1.07  CALCIUM 7.9* -- 9.1    PT/INR:  Basename 07/07/11 2135  LABPROT 23.6*  INR 2.06*   Radiology: Dg Chest Portable 1 View In Am  07/08/2011  *RADIOLOGY REPORT*  Clinical Data: Postoperative patient.  Intubated patient.  PORTABLE CHEST - 1 VIEW  Comparison: 07/07/2011.  Findings: Endotracheal tube and enteric tube appear in good position although the tip of the enteric tube is not visualized.  Right IJ vascular sheath transmits a Swan-Ganz catheter with the tip in the pulmonary outflow tract.  Intra-aortic balloon pump is present with the marker for the tip 5 cm inferior to the tip of the aortic arch.  Right thoracostomy tube and mediastinal drain.  Left thoracostomy tube.  No pneumothorax.  Interval development of opacity at the right apex.  This does not have the typical appearance for mucous plugging or collapse and is suspicious for hematoma or hemothorax.  Correlation with output from the of right chest drain is recommended. CABG changes noted.  IMPRESSION:  1.  Endotracheal tube, nasogastric tube and Swan-Ganz catheter appear in good position. 2.  Intra-aortic balloon pump with the tip in the descending thoracic aorta. 3.  Bilateral thoracostomy tubes and mediastinal drain. 4.  Interval development of right apical density, suspicious for hemorrhage/hematoma.  Correlation with right thoracostomy drain output is recommended.  This is a call report  Original Report  Authenticated By: Andreas Newport, M.D.   Dg Chest Portable 1 View  07/07/2011  *RADIOLOGY REPORT*  Clinical Data: Incorrect needle count.  PORTABLE CHEST - 1 VIEW  Comparison: 07/07/2011 at 1:35 p.m.  Findings: Endotracheal tube, Swan-Ganz catheter, and chest tubes appear in good position.  Aortic balloon pump tip is in the descending thoracic aorta.  No pneumothorax.  Minimal edema in both lungs.  IMPRESSION: Minimal interstitial edema.  No retained needles. Tubes and lines appear in good position.  Original Report Authenticated By:  Gwynn Burly, M.D.   Dg Chest Port 1 View  07/07/2011  *RADIOLOGY REPORT*  Clinical Data: No acute myocardial infarction.  Preop for CABG.  PORTABLE CHEST - 1 VIEW  Comparison: 10/18/2010  Findings: Heart size remains within normal limits.  Diffuse interstitial infiltrates are seen bilaterally, consistent with mild interstitial edema.  No evidence of pulmonary consolidation or pleural effusion.  Intra-aortic balloon pump is seen with tip at the level of the aortic arch.  IMPRESSION: Mild diffuse pulmonary interstitial edema.  Original Report Authenticated By: Danae Orleans, M.D.     Assessment/Plan: S/P Procedure(s) (LRB): CORONARY ARTERY BYPASS GRAFTING (CABG) (N/A) VENTRICULAR SEPTAL DEFECT (VSD) REPAIR (N/A) EXCISION MASS (N/A) Expected blood loss anemia acute  Acute preop anterior myocardial infarction Path on mass pending Continue on vent for now Leave MT/pts    [name] MD  Beeper [phone] Office [phone] 07/08/2011 9:21 AM

## 2011-07-09 ENCOUNTER — Inpatient Hospital Stay (HOSPITAL_COMMUNITY): Payer: Medicare Other

## 2011-07-09 LAB — GLUCOSE, CAPILLARY
Glucose-Capillary: 122 mg/dL — ABNORMAL HIGH (ref 70–99)
Glucose-Capillary: 131 mg/dL — ABNORMAL HIGH (ref 70–99)
Glucose-Capillary: 139 mg/dL — ABNORMAL HIGH (ref 70–99)
Glucose-Capillary: 143 mg/dL — ABNORMAL HIGH (ref 70–99)
Glucose-Capillary: 146 mg/dL — ABNORMAL HIGH (ref 70–99)
Glucose-Capillary: 175 mg/dL — ABNORMAL HIGH (ref 70–99)

## 2011-07-09 LAB — CBC
HCT: 24.2 % — ABNORMAL LOW (ref 36.0–46.0)
Hemoglobin: 8.7 g/dL — ABNORMAL LOW (ref 12.0–15.0)
MCH: 29.4 pg (ref 26.0–34.0)
MCHC: 36 g/dL (ref 30.0–36.0)
MCV: 81.8 fL (ref 78.0–100.0)
Platelets: 77 10*3/uL — ABNORMAL LOW (ref 150–400)
RBC: 2.96 MIL/uL — ABNORMAL LOW (ref 3.87–5.11)
RDW: 14.6 % (ref 11.5–15.5)
WBC: 15.9 10*3/uL — ABNORMAL HIGH (ref 4.0–10.5)

## 2011-07-09 LAB — COMPREHENSIVE METABOLIC PANEL
ALT: 357 U/L — ABNORMAL HIGH (ref 0–35)
AST: 286 U/L — ABNORMAL HIGH (ref 0–37)
Albumin: 2.1 g/dL — ABNORMAL LOW (ref 3.5–5.2)
Alkaline Phosphatase: 34 U/L — ABNORMAL LOW (ref 39–117)
BUN: 37 mg/dL — ABNORMAL HIGH (ref 6–23)
CO2: 19 mEq/L (ref 19–32)
Calcium: 7.3 mg/dL — ABNORMAL LOW (ref 8.4–10.5)
Chloride: 108 mEq/L (ref 96–112)
Creatinine, Ser: 1.19 mg/dL — ABNORMAL HIGH (ref 0.50–1.10)
GFR calc Af Amer: 47 mL/min — ABNORMAL LOW (ref >90)
GFR calc non Af Amer: 41 mL/min — ABNORMAL LOW (ref >90)
Glucose, Bld: 180 mg/dL — ABNORMAL HIGH (ref 70–99)
Potassium: 4 mEq/L (ref 3.5–5.1)
Sodium: 135 mEq/L (ref 135–145)
Total Bilirubin: 0.7 mg/dL (ref 0.3–1.2)
Total Protein: 3.7 g/dL — ABNORMAL LOW (ref 6.0–8.3)

## 2011-07-09 LAB — PROTIME-INR
INR: 1.79 — ABNORMAL HIGH (ref 0.00–1.49)
Prothrombin Time: 21.1 seconds — ABNORMAL HIGH (ref 11.6–15.2)

## 2011-07-09 LAB — APTT: aPTT: 38 seconds — ABNORMAL HIGH (ref 24–37)

## 2011-07-09 MED ORDER — DOCUSATE SODIUM 50 MG/5ML PO LIQD
200.0000 mg | Freq: Every day | ORAL | Status: DC
  Administered 2011-07-09 – 2011-07-10 (×2): 200 mg via ORAL
  Filled 2011-07-09 (×3): qty 20

## 2011-07-09 MED ORDER — PANTOPRAZOLE SODIUM 40 MG PO PACK
40.0000 mg | PACK | Freq: Every day | ORAL | Status: DC
  Administered 2011-07-09: 40 mg
  Filled 2011-07-09 (×3): qty 20

## 2011-07-09 MED ORDER — FUROSEMIDE 10 MG/ML IJ SOLN
40.0000 mg | Freq: Once | INTRAMUSCULAR | Status: AC
  Administered 2011-07-09: 40 mg via INTRAVENOUS
  Filled 2011-07-09: qty 4

## 2011-07-09 MED ORDER — CHLORHEXIDINE GLUCONATE 0.12 % MT SOLN
OROMUCOSAL | Status: AC
  Administered 2011-07-09: 15 mL
  Filled 2011-07-09: qty 15

## 2011-07-09 NOTE — Progress Notes (Signed)
BP 101/44  Pulse 32  Temp(Src) 99.3 F (37.4 C) (Core (Comment))  Resp 42  Ht 5\' 4"  (1.626 m)  Wt 162 lb 4.1 oz (73.6 kg)  BMI 27.85 kg/m2  SpO2 100%  IAB out feeet viable Pedal Dopplers present  I have seen and examined [name] and agree with the above assessment  and plan.  [name] MD Beeper [phone] Office [phone] 07/09/2011 6:58 PM

## 2011-07-09 NOTE — Progress Notes (Signed)
IABP and R venous sheath removed.  Manual pressure held for 30 minutes.  R groin level 0.  Pressure dressing applied.  VSS throughout.  Pt tolerated procedure well.  Pt ventilated and sedated and no family present.  Pt's RN at bedside.

## 2011-07-09 NOTE — Progress Notes (Signed)
Patient ID: [name], female   DOB: 29-May-1925, 78 y.o.   MRN: [id number] TCTS DAILY PROGRESS NOTE                   [address]            [place] [address]          [phone]      2 Days Post-Op Procedure(s) (LRB): CORONARY ARTERY BYPASS GRAFTING (CABG) (N/A) VENTRICULAR SEPTAL DEFECT (VSD) REPAIR (N/A) EXCISION MASS (N/A)  Total Length of Stay:  LOS: 2 days   Subjective: Sedated on vent, but wakes and follows commands  Objective: Vital signs in last 24 hours: Temp:  [97.9 F (36.6 C)-99.9 F (37.7 C)] 99.3 F (37.4 C) (02/03 0749) Pulse Rate:  [65-90] 90  (02/03 0749) Cardiac Rhythm:  [-] Atrial paced (02/02 2000) Resp:  [13-50] 15  (02/03 0749) BP: (80-140)/(28-72) 139/29 mmHg (02/03 0749) SpO2:  [96 %-100 %] 100 % (02/03 0749) Arterial Line BP: (70-148)/(22-44) 121/29 mmHg (02/03 0700) FiO2 (%):  [48.8 %-51.1 %] 50 % (02/03 0749) Weight:  [162 lb 4.1 oz (73.6 kg)] 162 lb 4.1 oz (73.6 kg) (02/03 0500)  Filed Weights   07/07/11 2130 07/08/11 0515 07/09/11 0500  Weight: 138 lb 14.2 oz (63 kg) 161 lb 6 oz (73.2 kg) 162 lb 4.1 oz (73.6 kg)    Weight change: 23 lb 9.4 oz (10.7 kg)   Hemodynamic parameters for last 24 hours: PAP: (24-34)/(13-20) 30/15 mmHg CO:  [2.8 L/min-4.3 L/min] 4.3 L/min CI:  [1.7 L/min/m2-2.6 L/min/m2] 2.6 L/min/m2  Intake/Output from previous day: 02/02 0701 - 02/03 0700 In: 3864.2 [I.V.:2824.2; Blood:730; NG/GT:160; IV Piggyback:150] Out: 3330 [Urine:550; Emesis/NG output:400; Chest Tube:2380]    Current Meds: Scheduled Meds:   . acetaminophen (TYLENOL) oral liquid 160 mg/5 mL  650 mg Per Tube NOW   Or  . acetaminophen  650 mg Rectal NOW  . acetaminophen  1,000 mg Oral Q6H   Or  . acetaminophen (TYLENOL) oral liquid 160 mg/5 mL  975 mg Per Tube Q6H  . aspirin EC  325 mg Oral Daily   Or  . aspirin  324 mg Per Tube Daily  . bisacodyl  10 mg Oral Daily   Or  . bisacodyl  10 mg Rectal Daily  . cefUROXime  (ZINACEF)  IV  1.5 g Intravenous Q12H  . docusate sodium  200 mg Oral Daily  . famotidine (PEPCID) IV  20 mg Intravenous Q12H  . insulin aspart  0-24 Units Subcutaneous Q4H  . metoprolol tartrate  12.5 mg Oral BID   Or  . metoprolol tartrate  12.5 mg Per Tube BID  . pantoprazole  40 mg Oral Q1200  . rosuvastatin  40 mg Oral q1800  . sodium chloride  10 mL Intravenous Q12H  . sodium chloride  3 mL Intravenous Q12H  . DISCONTD: insulin regular  0-10 Units Intravenous TID WC   Continuous Infusions:   . sodium chloride 20 mL/hr at 07/09/11 0700  . sodium chloride 20 mL (07/08/11 2353)  . sodium chloride    . dexmedetomidine (PRECEDEX) IV infusion 0.7 mcg/kg/hr (07/09/11 0700)  . DOPamine 3 mcg/kg/min (07/09/11 0700)  . insulin (NOVOLIN-R) infusion 2.8 Units/hr (07/08/11 1400)  . lactated ringers 20 mL/hr at 07/09/11 0700  . milrinone 0.3 mcg/kg/min (07/09/11 0700)  . nitroGLYCERIN 16.667 mcg/min (07/07/11 2145)  . phenylephrine (NEO-SYNEPHRINE) Adult infusion 40 mcg/min (07/09/11 0700)   PRN Meds:.metoprolol, midazolam, morphine injection, morphine,  ondansetron (ZOFRAN) IV, oxyCODONE, sodium chloride, sodium chloride  General appearance: alert, cooperative and no distress Neurologic: intact Heart: regular rate and rhythm, S1, S2 normal, no murmur, click, rub or gallop Lungs: clear to auscultation bilaterally Abdomen: soft, non-tender; bowel sounds normal; no masses,  no organomegaly Extremities: extremities normal, atraumatic, no cyanosis or edema and Homans sign is negative, no sign of DVT Feet viable doppler pulses  Lab Results: CBC: Basename 07/09/11 0355 07/08/11 1818 07/08/11 1700  WBC 15.9* -- 21.0*  HGB 8.7* 6.8* --  HCT 24.2* 20.0* --  PLT 77* -- 89*   BMET:  Basename 07/09/11 0355 07/08/11 1818 07/08/11 0345  NA 135 138 --  K 4.0 4.4 --  CL 108 110 --  CO2 19 -- 21  GLUCOSE 180* 126* --  BUN 37* 31* --  CREATININE 1.19* 1.10 --  CALCIUM 7.3* -- 7.9*      PT/INR:  Basename 07/07/11 2135  LABPROT 23.6*  INR 2.06*   Radiology: Dg Chest Port 1 View  07/08/2011  *RADIOLOGY REPORT*  Clinical Data: Increased bleeding from chest tubes.  PORTABLE CHEST - 1 VIEW  Comparison: 07/08/2011 at 5:25 a.m. and 07/07/2011  Findings: There has been further increase in the density in the apex of the right hemithorax consistent with a loculated pleural based hematoma.  Right chest tube is unchanged.  Swan-Ganz catheter, endotracheal tube, and NG tube are unchanged.  Left chest tube is unchanged.  New small left effusion.  Minimal atelectasis at the left base.  Pulmonary vascularity has improved with no residual edema.  IMPRESSION: Increasing density at the right lung apex, probably representing a pleural based hematoma.  No pneumothorax.  Resolution of mild pulmonary edema.  Small new left effusion.  Original Report Authenticated By: Gwynn Burly, M.D.   Dg Chest Portable 1 View In Am  07/08/2011  *RADIOLOGY REPORT*  Clinical Data: Postoperative patient.  Intubated patient.  PORTABLE CHEST - 1 VIEW  Comparison: 07/07/2011.  Findings: Endotracheal tube and enteric tube appear in good position although the tip of the enteric tube is not visualized.  Right IJ vascular sheath transmits a Swan-Ganz catheter with the tip in the pulmonary outflow tract.  Intra-aortic balloon pump is present with the marker for the tip 5 cm inferior to the tip of the aortic arch.  Right thoracostomy tube and mediastinal drain.  Left thoracostomy tube.  No pneumothorax.  Interval development of opacity at the right apex.  This does not have the typical appearance for mucous plugging or collapse and is suspicious for hematoma or hemothorax.  Correlation with output from the of right chest drain is recommended. CABG changes noted.  IMPRESSION:  1.  Endotracheal tube, nasogastric tube and Swan-Ganz catheter appear in good position. 2.  Intra-aortic balloon pump with the tip in the descending thoracic  aorta. 3.  Bilateral thoracostomy tubes and mediastinal drain. 4.  Interval development of right apical density, suspicious for hemorrhage/hematoma.  Correlation with right thoracostomy drain output is recommended.  This is a call report  Original Report Authenticated By: Andreas Newport, M.D.   Dg Chest Portable 1 View  07/07/2011  *RADIOLOGY REPORT*  Clinical Data: Incorrect needle count.  PORTABLE CHEST - 1 VIEW  Comparison: 07/07/2011 at 1:35 p.m.  Findings: Endotracheal tube, Swan-Ganz catheter, and chest tubes appear in good position.  Aortic balloon pump tip is in the descending thoracic aorta.  No pneumothorax.  Minimal edema in both lungs.  IMPRESSION: Minimal interstitial edema.  No retained needles.  Tubes and lines appear in good position.  Original Report Authenticated By: Gwynn Burly, M.D.   Dg Chest Port 1 View  07/07/2011  *RADIOLOGY REPORT*  Clinical Data: No acute myocardial infarction.  Preop for CABG.  PORTABLE CHEST - 1 VIEW  Comparison: 10/18/2010  Findings: Heart size remains within normal limits.  Diffuse interstitial infiltrates are seen bilaterally, consistent with mild interstitial edema.  No evidence of pulmonary consolidation or pleural effusion.  Intra-aortic balloon pump is seen with tip at the level of the aortic arch.  IMPRESSION: Mild diffuse pulmonary interstitial edema.  Original Report Authenticated By: Danae Orleans, M.D.   Dg Chest Port 1v Same Day  07/09/2011  *RADIOLOGY REPORT*  Clinical Data: Post CABG  PORTABLE CHEST - 1 VIEW SAME DAY  Comparison: 07/08/2011  Findings: Endotracheal tube appropriately positioned. Nasogastric tube terminates below the level of the diaphragms but the tip is not included on the film.  Right IJ Swan-Ganz catheter tip terminates over the main pulmonary artery.  Intra-aortic balloon pump tip terminates over the distal arch.  Bilateral chest tubes in mediastinal drain in place.  Left mid lung zone atelectasis is slightly increased.  Right  apical capping which could reflect pleural effusion or hemothorax, is stable.  No pneumothorax. Small left pleural effusion again noted tracking posteriorly.  IMPRESSION: Increased left mid lung zone atelectasis.  Otherwise stable findings.  Original Report Authenticated By: Harrel Lemon, M.D.     Assessment/Plan: S/P Procedure(s) (LRB): CORONARY ARTERY BYPASS GRAFTING (CABG) (N/A) VENTRICULAR SEPTAL DEFECT (VSD) REPAIR (N/A) EXCISION MASS (N/A) Chest xray stable increased pleural density at rt apex Try to wean IAB today then vent Avoid heparin with decreased plts  [name] MD  Beeper [phone] Office [phone] 07/09/2011 8:16 AM

## 2011-07-10 ENCOUNTER — Inpatient Hospital Stay (HOSPITAL_COMMUNITY): Payer: Medicare Other

## 2011-07-10 ENCOUNTER — Encounter (HOSPITAL_COMMUNITY): Payer: Self-pay | Admitting: Cardiothoracic Surgery

## 2011-07-10 DIAGNOSIS — Q210 Ventricular septal defect: Secondary | ICD-10-CM

## 2011-07-10 DIAGNOSIS — I2109 ST elevation (STEMI) myocardial infarction involving other coronary artery of anterior wall: Principal | ICD-10-CM

## 2011-07-10 LAB — COMPREHENSIVE METABOLIC PANEL
ALT: 254 U/L — ABNORMAL HIGH (ref 0–35)
AST: 140 U/L — ABNORMAL HIGH (ref 0–37)
Albumin: 2 g/dL — ABNORMAL LOW (ref 3.5–5.2)
Alkaline Phosphatase: 43 U/L (ref 39–117)
BUN: 32 mg/dL — ABNORMAL HIGH (ref 6–23)
CO2: 22 mEq/L (ref 19–32)
Calcium: 7.7 mg/dL — ABNORMAL LOW (ref 8.4–10.5)
Chloride: 106 mEq/L (ref 96–112)
Creatinine, Ser: 1.07 mg/dL (ref 0.50–1.10)
GFR calc Af Amer: 54 mL/min — ABNORMAL LOW (ref >90)
GFR calc non Af Amer: 46 mL/min — ABNORMAL LOW (ref >90)
Glucose, Bld: 129 mg/dL — ABNORMAL HIGH (ref 70–99)
Potassium: 3.4 mEq/L — ABNORMAL LOW (ref 3.5–5.1)
Sodium: 134 mEq/L — ABNORMAL LOW (ref 135–145)
Total Bilirubin: 0.5 mg/dL (ref 0.3–1.2)
Total Protein: 4.1 g/dL — ABNORMAL LOW (ref 6.0–8.3)

## 2011-07-10 LAB — POCT I-STAT, CHEM 8
BUN: 24 mg/dL — ABNORMAL HIGH (ref 6–23)
Calcium, Ion: 0.95 mmol/L — ABNORMAL LOW (ref 1.12–1.32)
Chloride: 111 mEq/L (ref 96–112)
Creatinine, Ser: 1.1 mg/dL (ref 0.50–1.10)
Glucose, Bld: 215 mg/dL — ABNORMAL HIGH (ref 70–99)
HCT: 38 % (ref 36.0–46.0)
Hemoglobin: 12.9 g/dL (ref 12.0–15.0)
Potassium: 3.2 mEq/L — ABNORMAL LOW (ref 3.5–5.1)
Sodium: 145 mEq/L (ref 135–145)
TCO2: 18 mmol/L (ref 0–100)

## 2011-07-10 LAB — BASIC METABOLIC PANEL
BUN: 32 mg/dL — ABNORMAL HIGH (ref 6–23)
CO2: 21 mEq/L (ref 19–32)
Calcium: 7.8 mg/dL — ABNORMAL LOW (ref 8.4–10.5)
Chloride: 105 mEq/L (ref 96–112)
Creatinine, Ser: 0.92 mg/dL (ref 0.50–1.10)
GFR calc Af Amer: 64 mL/min — ABNORMAL LOW (ref >90)
GFR calc non Af Amer: 56 mL/min — ABNORMAL LOW (ref >90)
Glucose, Bld: 109 mg/dL — ABNORMAL HIGH (ref 70–99)
Potassium: 3.9 mEq/L (ref 3.5–5.1)
Sodium: 134 mEq/L — ABNORMAL LOW (ref 135–145)

## 2011-07-10 LAB — GLUCOSE, CAPILLARY
Glucose-Capillary: 100 mg/dL — ABNORMAL HIGH (ref 70–99)
Glucose-Capillary: 128 mg/dL — ABNORMAL HIGH (ref 70–99)
Glucose-Capillary: 89 mg/dL (ref 70–99)
Glucose-Capillary: 119 mg/dL — ABNORMAL HIGH (ref 70–99)
Glucose-Capillary: 92 mg/dL (ref 70–99)
Glucose-Capillary: 101 mg/dL — ABNORMAL HIGH (ref 70–99)

## 2011-07-10 LAB — CBC
HCT: 18.8 % — ABNORMAL LOW (ref 36.0–46.0)
HCT: 24.7 % — ABNORMAL LOW (ref 36.0–46.0)
Hemoglobin: 6.5 g/dL — CL (ref 12.0–15.0)
Hemoglobin: 8.7 g/dL — ABNORMAL LOW (ref 12.0–15.0)
MCH: 28.6 pg (ref 26.0–34.0)
MCH: 29.5 pg (ref 26.0–34.0)
MCHC: 34.6 g/dL (ref 30.0–36.0)
MCHC: 35.2 g/dL (ref 30.0–36.0)
MCV: 82.8 fL (ref 78.0–100.0)
MCV: 83.7 fL (ref 78.0–100.0)
Platelets: 40 10*3/uL — ABNORMAL LOW (ref 150–400)
Platelets: 53 10*3/uL — ABNORMAL LOW (ref 150–400)
RBC: 2.27 MIL/uL — ABNORMAL LOW (ref 3.87–5.11)
RBC: 2.95 MIL/uL — ABNORMAL LOW (ref 3.87–5.11)
RDW: 15.2 % (ref 11.5–15.5)
RDW: 14.9 % (ref 11.5–15.5)
WBC: 7.8 10*3/uL (ref 4.0–10.5)
WBC: 12.4 10*3/uL — ABNORMAL HIGH (ref 4.0–10.5)

## 2011-07-10 LAB — PREPARE FRESH FROZEN PLASMA
Unit division: 0
Unit division: 0

## 2011-07-10 LAB — PROTIME-INR
INR: 1.41 (ref 0.00–1.49)
Prothrombin Time: 17.5 seconds — ABNORMAL HIGH (ref 11.6–15.2)

## 2011-07-10 LAB — POCT ACTIVATED CLOTTING TIME: Activated Clotting Time: 155 seconds

## 2011-07-10 MED ORDER — FUROSEMIDE 10 MG/ML IJ SOLN
40.0000 mg | Freq: Two times a day (BID) | INTRAMUSCULAR | Status: AC
  Administered 2011-07-10 (×2): 40 mg via INTRAVENOUS
  Filled 2011-07-10 (×2): qty 4

## 2011-07-10 MED ORDER — POTASSIUM CHLORIDE 10 MEQ/50ML IV SOLN
INTRAVENOUS | Status: AC
  Administered 2011-07-10: 10 meq via INTRAVENOUS
  Filled 2011-07-10: qty 150

## 2011-07-10 MED ORDER — POTASSIUM CHLORIDE 10 MEQ/50ML IV SOLN
10.0000 meq | INTRAVENOUS | Status: AC
  Administered 2011-07-10 (×3): 10 meq via INTRAVENOUS

## 2011-07-10 NOTE — Progress Notes (Signed)
Patient ID: [name], female   DOB: 1924-12-04, 78 y.o.   MRN: [id number] TCTS DAILY PROGRESS NOTE                   [address]            [place] [address]          [phone]      3 Days Post-Op Procedure(s) (LRB): CORONARY ARTERY BYPASS GRAFTING (CABG) (N/A) VENTRICULAR SEPTAL DEFECT (VSD) REPAIR (N/A) EXCISION MASS (N/A)  Total Length of Stay:  LOS: 3 days   Subjective: Opens eyes, follows commands  Objective: Vital signs in last 24 hours: Temp:  [97.9 F (36.6 C)-99.7 F (37.6 C)] 97.9 F (36.6 C) (02/04 0722) Pulse Rate:  [29-90] 90  (02/04 0715) Cardiac Rhythm:  [-] Atrial paced (02/03 2000) Resp:  [12-43] 14  (02/04 0715) BP: (84-139)/(29-68) 97/42 mmHg (02/04 0700) SpO2:  [97 %-100 %] 100 % (02/04 0715) Arterial Line BP: (86-133)/(31-54) 101/51 mmHg (02/04 0715) FiO2 (%):  [48.9 %-100 %] 50.3 % (02/04 0715) Weight:  [159 lb 2.8 oz (72.2 kg)] 159 lb 2.8 oz (72.2 kg) (02/04 0630)  Filed Weights   07/08/11 0515 07/09/11 0500 07/10/11 0630  Weight: 161 lb 6 oz (73.2 kg) 162 lb 4.1 oz (73.6 kg) 159 lb 2.8 oz (72.2 kg)    Weight change: -3 lb 1.4 oz (-1.4 kg)   Hemodynamic parameters for last 24 hours: PAP: (26-44)/(11-25) 40/22 mmHg CO:  [4.7 L/min-5.5 L/min] 5.5 L/min CI:  [2.7 L/min/m2-3.3 L/min/m2] 3.3 L/min/m2  Intake/Output from previous day: 02/03 0701 - 02/04 0700 In: 2261.5 [I.V.:1287.5; Blood:774; NG/GT:100; IV Piggyback:100] Out: 2590 [Urine:1540; Emesis/NG output:250; Chest Tube:800]  Intake/Output this shift:    Current Meds: Scheduled Meds:   . acetaminophen  1,000 mg Oral Q6H   Or  . acetaminophen (TYLENOL) oral liquid 160 mg/5 mL  975 mg Per Tube Q6H  . aspirin EC  325 mg Oral Daily   Or  . aspirin  324 mg Per Tube Daily  . bisacodyl  10 mg Oral Daily   Or  . bisacodyl  10 mg Rectal Daily  . cefUROXime (ZINACEF)  IV  1.5 g Intravenous Q12H  . chlorhexidine      . docusate  200 mg Oral Daily  .  furosemide  40 mg Intravenous Once  . furosemide  40 mg Intravenous Q12H  . insulin aspart  0-24 Units Subcutaneous Q4H  . metoprolol tartrate  12.5 mg Oral BID   Or  . metoprolol tartrate  12.5 mg Per Tube BID  . pantoprazole sodium  40 mg Per Tube Q1200  . potassium chloride  10 mEq Intravenous Q1 Hr x 3  . potassium chloride      . sodium chloride  10 mL Intravenous Q12H  . sodium chloride  3 mL Intravenous Q12H  .      .      .       Continuous Infusions:   . sodium chloride 20 mL/hr at 07/10/11 0500  . sodium chloride 20 mL/hr at 07/10/11 0000  . sodium chloride 250 mL (07/10/11 0555)  . dexmedetomidine (PRECEDEX) IV infusion 0.7 mcg/kg/hr (07/10/11 0601)  . DOPamine 3 mcg/kg/min (07/10/11 0601)  . insulin (NOVOLIN-R) infusion 2.8 Units/hr (07/08/11 1400)  . lactated ringers 20 mL/hr at 07/10/11 0601  . milrinone 0.3 mcg/kg/min (07/10/11 0601)  . nitroGLYCERIN 16.667 mcg/min (07/07/11 2145)  . phenylephrine (NEO-SYNEPHRINE) Adult infusion 3 mcg/min (  07/10/11 0545)   PRN Meds:.metoprolol, midazolam, morphine, ondansetron (ZOFRAN) IV, oxyCODONE, sodium chloride, sodium chloride  General appearance: cooperative and no distress Neurologic: intact Heart: regular rate and rhythm, S1, S2 normal, no murmur, click, rub or gallop Lungs: diminished breath sounds bibasilar Abdomen: soft, non-tender; bowel sounds normal; no masses,  no organomegaly Extremities: extremities normal, atraumatic, no cyanosis or edema, Homans sign is negative, no sign of DVT and feet viable, rt groin ok   Lab Results: CBC: Basename 07/10/11 0416 07/09/11 0355  WBC 7.8 15.9*  HGB 6.5* 8.7*  HCT 18.8* 24.2*  PLT 40* 77*   BMET:  Basename 07/10/11 0416 07/09/11 0355  NA 134* 135  K 3.4* 4.0  CL 106 108  CO2 22 19  GLUCOSE 129* 180*  BUN 32* 37*  CREATININE 1.07 1.19*  CALCIUM 7.7* 7.3*    PT/INR:  Basename 07/10/11 0416  LABPROT 17.5*  INR 1.41   Radiology: Dg Chest Port 1  View  07/08/2011  *RADIOLOGY REPORT*  Clinical Data: Increased bleeding from chest tubes.  PORTABLE CHEST - 1 VIEW  Comparison: 07/08/2011 at 5:25 a.m. and 07/07/2011  Findings: There has been further increase in the density in the apex of the right hemithorax consistent with a loculated pleural based hematoma.  Right chest tube is unchanged.  Swan-Ganz catheter, endotracheal tube, and NG tube are unchanged.  Left chest tube is unchanged.  New small left effusion.  Minimal atelectasis at the left base.  Pulmonary vascularity has improved with no residual edema.  IMPRESSION: Increasing density at the right lung apex, probably representing a pleural based hematoma.  No pneumothorax.  Resolution of mild pulmonary edema.  Small new left effusion.  Original Report Authenticated By: Gwynn Burly, M.D.   Dg Chest Port 1v Same Day  07/09/2011  *RADIOLOGY REPORT*  Clinical Data: Post CABG  PORTABLE CHEST - 1 VIEW SAME DAY  Comparison: 07/08/2011  Findings: Endotracheal tube appropriately positioned. Nasogastric tube terminates below the level of the diaphragms but the tip is not included on the film.  Right IJ Swan-Ganz catheter tip terminates over the main pulmonary artery.  Intra-aortic balloon pump tip terminates over the distal arch.  Bilateral chest tubes in mediastinal drain in place.  Left mid lung zone atelectasis is slightly increased.  Right apical capping which could reflect pleural effusion or hemothorax, is stable.  No pneumothorax. Small left pleural effusion again noted tracking posteriorly.  IMPRESSION: Increased left mid lung zone atelectasis.  Otherwise stable findings.  Original Report Authenticated By: Harrel Lemon, M.D.     Assessment/Plan: S/P Procedure(s) (LRB): CORONARY ARTERY BYPASS GRAFTING (CABG) (N/A) VENTRICULAR SEPTAL DEFECT (VSD) REPAIR (N/A) EXCISION MASS (N/A) Wean to extubate as tolerated Given prbcs for low hct Diuresis as tolerated Renal function stable  [name] MD  Beeper [phone] Office [phone] 07/10/2011 7:44 AM

## 2011-07-10 NOTE — Progress Notes (Signed)
Pt MT's D/C'd per MD orders; Pt H.O.B. Raised to 30 degrees prior to removal to allow for extra drainage potential; Pt pre-medicated for procedure and laid flat; all old dressings removed and sutures isolated for securing sites of MT's; Both MT's then removed without any difficulty and intact; sites secured with in place sutures and dressed with petroleum dressing and gauze, PCT sites were then redressed with petroleum dressing and gauze as well. PCT's remain @ -20cm suction. Pt tolerated procedure without difficulty.

## 2011-07-10 NOTE — Op Note (Signed)
[name], [name] NO.:  [id number]  MEDICAL RECORD NO.:  [id number]  LOCATION:  2301                         FACILITY:  [facility]  PHYSICIAN:  [name], MD    DATE OF BIRTH:  [birth year]  DATE OF PROCEDURE:  07/07/2011 DATE OF DISCHARGE:                              OPERATIVE REPORT   PREOPERATIVE DIAGNOSIS:  Out-of-hospital acute anterior myocardial infarction with post infarct acute ventricular septal defect.  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  PROCEDURE PERFORMED:  Left thigh endo-vein harvesting and coronary artery bypass grafting x2 with a sequential reverse saphenous vein graft to the diagonal and left anterior descending coronary artery.  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  For several days she had been feeling poorly with nausea, vomiting, some loose stools, went to her primary care doctor on the morning of surgery.  EKG showed acute anterior myocardial infarction. She was transferred to [facility] as a STEMI.  The patient was loaded with Angiomax and cardiac catheterization was performed by Dr. Eden Emms which demonstrated total occlusion of the left anterior descending coronary artery, 80% stenosis of the diagonal, luminal irregularities and possible 50% stenosis in the posterior descending.  Ventriculogram showed anterior wall akinesis and an anterior septal and ventricular septal defect.  This was confirmed on echocardiogram.  Emergency surgical consult was obtained.  The risks and options of emergent bypass surgery and repair of ventricular septal defect were reviewed with the patient, her daughter and son in detail.  The patient was awake, alert and oriented and was  willing to proceed.  Intra-aortic balloon pump was placed.  DESCRIPTION OF PROCEDURE:  With Swan-Ganz and arterial line monitors in place, the patient underwent general endotracheal anesthesia  A transesophageal echo probe was placed.  Using the [employer] endo vein harvesting system, a segment of vein was harvested from the left calf and was of adequate quality and caliber.  Median sternotomy was performed.  In the anterior mediastinum was approximately a 4 cm firm mass invading the right pleura and into the lung and was invading the pericardium but had not grown into the aorta.  The mass was excised and submitted to pathology in total.  No definite diagnosis could be obtained but it appeared to be a thymoma.  We then proceeded with systemic heparinization.  The ascending aorta was cannulated.  Superior and inferior vena caval cannulas were placed.  Caval tapes were placed. The patient was placed on cardiopulmonary bypass 2.4 L/min/m2.  An aortic root vent cardioplegia needle was introduced into the ascending aorta.  The patient's body temperature was cooled to 32 degrees.  Aortic crossclamp was applied and 500 mL of cold blood potassium cardioplegia was administered.  Attention was turned first to the repair of the ventricular septal defect in the anterior wall just to the left of the LAD and toward the apex.  The left ventricular cavity was entered.  This gave a good view of the septal and the area of  perforation was identifiable.  A 4 x 4 segment of bovine pericardium was then selected, trimmed slightly and then over-sewed the inner apical aspect of the left ventricular cavity with a running 3-0 Prolene with the final edge of this coming through the ventriculotomy.  The ventriculotomy was then closed over felt strips with interrupted #2 Tycron pledgeted sutures and then oversewn with a running 3-0 Prolene.  Additional cold blood cardioplegia was then administered through the retrograde  cardioplegia catheter which had been placed through a separate site in the right atrium.  Attention was then turned to the coronary artery disease.  The patient's upper portion of the anterior wall appeared to still be viable, so a sequential bypass was placed to the diagonal and LAD.  The short natural wire was trimmed appropriately opening the diagonal, the vessel was approximately 1.2 mm in size.  Using a running 7-0 Prolene, distal anastomosis was performed.  The distal extent of the same vein was then carried to the LAD, this vessel was opened approximately 1.4- 1.5 mm in size.  Using a running 7-0 Prolene, the distal anastomosis was performed.  Additional cold blood cardioplegia was administered down the vein grafts.  With the cross-clamps still in place, a single punch aortotomy was performed, and the vein graft was anastomosed to the ascending aorta.  Air was evacuated before complete closure of the aortotomy, the heart was allowed to passively fill and de-air.  The aortotomy was completed.  A warm retrograde cardioplegia was administered.  The aortic crossclamp was then removed with a total cross- clamp time of 100 minutes.  The retrograde catheter was removed.  The heart was allowed to passively fill showing the repair was intact. There was no evidence of shunt on TEE.  With the patient's body temperature rewarmed to 37 degrees,  atrial and ventricular pacing wires were applied.  She was loaded with milrinone and started on dopamine infusion.  She was then weaned from cardiopulmonary bypass with the aid of intra-aortic balloon pump on milrinone and dopamine infusion.  Her rhythm was intact.  With pressor and balloon support, she remained hemodynamically stable.  She was then decannulated in usual fashion. Protamine sulfate was administered with operative field hemostatic.  Two atrial and ventricular pacing wires were applied.  Graft markers were applied.  Two pleural tubes were  placed.  Two Blake mediastinal drains were placed.  With the operative field hemostatic, the sternum was then closed with #6 stainless steel wire, fascia closed with interrupted 0 Vicryl, running 3-0 Vicryl in subcutaneous tissue, and 4-0 subcuticular stitch in skin edges.  Dry dressings were applied.  Sponge and needle count were reported as correct at completion of procedure.  The patient tolerated the procedure without obvious complication.  She was transferred to the surgical intensive care unit, intubated with intra- aortic balloon pump in place, critically ill, but hemodynamically stable.  Total pump time was 147 minutes.     [name], MD     EG/MEDQ  D:  07/09/2011  T:  07/10/2011  Job:  [id number]  cc:   Noralyn Pick. Eden Emms, MD, [facility]

## 2011-07-10 NOTE — Procedures (Signed)
Extubation Procedure Note  Patient Details:   Name: [name] DOB: 1924-08-03 MRN: [id number]   Airway Documentation:  Airway 8 mm (Active)    Evaluation  O2 sats: stable throughout Complications: No apparent complications Patient did tolerate procedure well. Bilateral Breath Sounds: Clear;Diminished Suctioning: Airway Yes  Patient had NIF 40, VC 780cc. Pt wearing 4lpm [place] once extubated.   Clearance Coots 07/10/2011, 12:02 PM

## 2011-07-10 NOTE — Progress Notes (Signed)
Patient ID: [name], female   DOB: 06-Jun-1924, 78 y.o.   MRN: [id number]  Filed Vitals:   07/10/11 1900 07/10/11 1918 07/10/11 2000 07/10/11 2100  BP: 138/57  124/49 107/38  Pulse: 95  91 87  Temp:  98.1 F (36.7 C)    TempSrc:  Oral    Resp: 24  19 16   Height:      Weight:      SpO2: 100%  100% 98%   Still on milrinone 0.3 and dop3  Diuresing well  CBC    Component Value Date/Time   WBC 12.4* 07/10/2011 1720   RBC 2.95* 07/10/2011 1720   HGB 8.7* 07/10/2011 1720   HCT 24.7* 07/10/2011 1720   PLT 53* 07/10/2011 1720   MCV 83.7 07/10/2011 1720   MCH 29.5 07/10/2011 1720   MCHC 35.2 07/10/2011 1720   RDW 14.9 07/10/2011 1720   LYMPHSABS 1.8 10/18/2010 1550   MONOABS 0.7 10/18/2010 1550   EOSABS 0.4 10/18/2010 1550   BASOSABS 0.1 10/18/2010 1550    BMET    Component Value Date/Time   NA 134* 07/10/2011 1720   K 3.9 07/10/2011 1720   CL 105 07/10/2011 1720   CO2 21 07/10/2011 1720   GLUCOSE 109* 07/10/2011 1720   BUN 32* 07/10/2011 1720   CREATININE 0.92 07/10/2011 1720   CALCIUM 7.8* 07/10/2011 1720   GFRNONAA 56* 07/10/2011 1720   GFRAA 64* 07/10/2011 1720    A/P:  Doing well.  Continue plans per EBG.

## 2011-07-10 NOTE — Progress Notes (Signed)
Patient ID: [name], female   DOB: 1924/10/20, 78 y.o.   MRN: [id number] @ Subjective:  Intubated and sedated  Objective:  Filed Vitals:   07/10/11 0800 07/10/11 0815 07/10/11 0830 07/10/11 0845  BP: 105/49     Pulse: 90 90 90 90  Temp: 97.9 F (36.6 C) 97.9 F (36.6 C) 98.1 F (36.7 C) 98.1 F (36.7 C)  TempSrc:      Resp: 16 14 14  0  Height:      Weight:      SpO2: 99%       Intake/Output from previous day:  Intake/Output Summary (Last 24 hours) at 07/10/11 0855 Last data filed at 07/10/11 0800  Gross per 24 hour  Intake 2141.3 ml  Output   2600 ml  Net -458.7 ml    Physical Exam: Intubated Rhonchi mechanical ventilation S1/S2 no murmur BS positive Lines in place Plus one PT's Neuro grossly ntact   Lab Results: Basic Metabolic Panel:  Basename 07/10/11 0416 07/09/11 0355 07/08/11 1700 07/08/11 0345  NA 134* 135 -- --  K 3.4* 4.0 -- --  CL 106 108 -- --  CO2 22 19 -- --  GLUCOSE 129* 180* -- --  BUN 32* 37* -- --  CREATININE 1.07 1.19* -- --  CALCIUM 7.7* 7.3* -- --  MG -- -- 2.6* 3.0*  PHOS -- -- -- --   Liver Function Tests:  [facility] 07/10/11 0416 07/09/11 0355  AST 140* 286*  ALT 254* 357*  ALKPHOS 43 34*  BILITOT 0.5 0.7  PROT 4.1* 3.7*  ALBUMIN 2.0* 2.1*   No results found for this basename: LIPASE:2,AMYLASE:2 in the last 72 hours CBC:  Basename 07/10/11 0416 07/09/11 0355  WBC 7.8 15.9*  NEUTROABS -- --  HGB 6.5* 8.7*  HCT 18.8* 24.2*  MCV 82.8 81.8  PLT 40* 77*   Cardiac Enzymes:  Basename 07/08/11 0345 07/07/11 1304 07/07/11 1055  CKTOTAL 2764* 2252* 1798*  CKMB 313.2* 251.5* 205.2*  CKMBINDEX -- -- --  TROPONINI -- 24.29* >25.00*   BNP: No components found with this basename: POCBNP:3 D-Dimer: No results found for this basename: DDIMER:2 in the last 72 hours Hemoglobin A1C:  Basename 07/07/11 1055  HGBA1C 6.0*   Fasting Lipid Panel:  Basename 07/08/11 0345  CHOL 44  HDL 19*  LDLCALC 15  TRIG 49    CHOLHDL 2.3  LDLDIRECT --   Thyroid Function Tests:  Basename 07/07/11 1304  TSH 3.531  T4TOTAL --  T3FREE --  THYROIDAB --   Anemia Panel: No results found for this basename: VITAMINB12,FOLATE,FERRITIN,TIBC,IRON,RETICCTPCT in the last 72 hours  Imaging: Dg Chest 1 View  07/10/2011  *RADIOLOGY REPORT*  Clinical Data: Follow-up for surgery.  CHEST - 1 VIEW  Comparison: Multiple priors, most recently chest x-ray 07/09/2011.  Findings: Swan-Ganz catheter and proximal left main pulmonary artery.  Endotracheal tube approximate 2.5 cm above the carina. Nasogastric tube extends at least to the stomach (below the lower margin of the film).  Bilateral chest tubes in place (unchanged in position).  Status post median sternotomy for CABG and VSD closure.  Lung volumes have decreased slightly.  Increasing bibasilar opacities favored to represent areas of postoperative atelectasis. There is likely a small left-sided pleural effusion.  Opacity in the right apex has decreased, indicative of a decreasing right apical pleural effusion.  Heart size remains mildly enlarged (unchanged).  Mediastinal contours are unchanged.  IMPRESSION:  1.  Support apparatus unchanged. 2.  Worsening bibasilar aeration, likely reflect  increasing bibasilar subsegmental atelectasis.  There is also a small left basilar pleural effusion (unchanged) and small right apical pleural effusion (decreased in size).  Original Report Authenticated By: Florencia Reasons, M.D.   Dg Chest Port 1 View  07/08/2011  *RADIOLOGY REPORT*  Clinical Data: Increased bleeding from chest tubes.  PORTABLE CHEST - 1 VIEW  Comparison: 07/08/2011 at 5:25 a.m. and 07/07/2011  Findings: There has been further increase in the density in the apex of the right hemithorax consistent with a loculated pleural based hematoma.  Right chest tube is unchanged.  Swan-Ganz catheter, endotracheal tube, and NG tube are unchanged.  Left chest tube is unchanged.  New small left  effusion.  Minimal atelectasis at the left base.  Pulmonary vascularity has improved with no residual edema.  IMPRESSION: Increasing density at the right lung apex, probably representing a pleural based hematoma.  No pneumothorax.  Resolution of mild pulmonary edema.  Small new left effusion.  Original Report Authenticated By: Gwynn Burly, M.D.   Dg Chest Port 1v Same Day  07/09/2011  *RADIOLOGY REPORT*  Clinical Data: Post CABG  PORTABLE CHEST - 1 VIEW SAME DAY  Comparison: 07/08/2011  Findings: Endotracheal tube appropriately positioned. Nasogastric tube terminates below the level of the diaphragms but the tip is not included on the film.  Right IJ Swan-Ganz catheter tip terminates over the main pulmonary artery.  Intra-aortic balloon pump tip terminates over the distal arch.  Bilateral chest tubes in mediastinal drain in place.  Left mid lung zone atelectasis is slightly increased.  Right apical capping which could reflect pleural effusion or hemothorax, is stable.  No pneumothorax. Small left pleural effusion again noted tracking posteriorly.  IMPRESSION: Increased left mid lung zone atelectasis.  Otherwise stable findings.  Original Report Authenticated By: Harrel Lemon, M.D.    Cardiac Studies:  ECG:  Orders placed during the hospital encounter of 07/07/11  . EKG 12-LEAD  . EKG 12-LEAD  . EKG 12-LEAD  . EKG 12-LEAD     Telemetry: A paced /NSR  Echo:  EF 35% VSD preop   Medications:     . acetaminophen  1,000 mg Oral Q6H   Or  . acetaminophen (TYLENOL) oral liquid 160 mg/5 mL  975 mg Per Tube Q6H  . aspirin EC  325 mg Oral Daily   Or  . aspirin  324 mg Per Tube Daily  . bisacodyl  10 mg Oral Daily   Or  . bisacodyl  10 mg Rectal Daily  . cefUROXime (ZINACEF)  IV  1.5 g Intravenous Q12H  . chlorhexidine      . docusate  200 mg Oral Daily  . furosemide  40 mg Intravenous Once  . furosemide  40 mg Intravenous BID  . insulin aspart  0-24 Units Subcutaneous Q4H  .  metoprolol tartrate  12.5 mg Oral BID   Or  . metoprolol tartrate  12.5 mg Per Tube BID  . pantoprazole sodium  40 mg Per Tube Q1200  . potassium chloride  10 mEq Intravenous Q1 Hr x 3  . sodium chloride  10 mL Intravenous Q12H  . sodium chloride  3 mL Intravenous Q12H  . DISCONTD: docusate sodium  200 mg Oral Daily  . DISCONTD: pantoprazole  40 mg Oral Q1200  . DISCONTD: rosuvastatin  40 mg Oral q1800       . sodium chloride 20 mL/hr at 07/10/11 0500  . sodium chloride 20 mL/hr at 07/10/11 0000  . sodium chloride 250  mL (07/10/11 0555)  . dexmedetomidine (PRECEDEX) IV infusion 0.4 mcg/kg/hr (07/10/11 0853)  . DOPamine 3 mcg/kg/min (07/10/11 0800)  . insulin (NOVOLIN-R) infusion 2.8 Units/hr (07/08/11 1400)  . lactated ringers 20 mL/hr at 07/10/11 0800  . milrinone 0.3 mcg/kg/min (07/10/11 0800)  . nitroGLYCERIN 16.667 mcg/min (07/07/11 2145)  . phenylephrine (NEO-SYNEPHRINE) Adult infusion 3 mcg/min (07/10/11 0545)    Assessment/Plan:  Ant MI/VSD:  Doing well considering.  VSS  ? Hematoma RUL with continued low Hct.  Transfuse as needed Pathology on mediastinal mass pending.  Appreciate expedited care by CVTS on Friday  [name] 07/10/2011, 8:55 AM

## 2011-07-10 NOTE — Progress Notes (Signed)
UR Completed.  [name] Jane [phone] 07/10/2011  

## 2011-07-10 NOTE — Progress Notes (Signed)
INITIAL ADULT NUTRITION ASSESSMENT Date: 07/10/2011   Time: 10:50 AM  Reason for Assessment: Low Braden, ICU  ASSESSMENT: Female 78 y.o.  Dx: s/p CABG, ventricular septal defect repair, excision of mediastinal mass (extending into lungs and pericardium)   Hx:  Past Medical History  Diagnosis Date  . Acute MI, anterior wall     a. 07/07/2011  . HTN (hypertension)   . Osteoarthritis of knee     Bilateral.  a. s/p LTKA ~2007;  b. s/p RTKA 10/2010  . CAD (coronary artery disease)     a. 07/07/2011 - Occluded LAD  . VSD (ventricular septal defect)     a.  07/07/2011 - Noted @ time of MI    Related Meds:     . acetaminophen  1,000 mg Oral Q6H   Or  . acetaminophen (TYLENOL) oral liquid 160 mg/5 mL  975 mg Per Tube Q6H  . aspirin EC  325 mg Oral Daily   Or  . aspirin  324 mg Per Tube Daily  . bisacodyl  10 mg Oral Daily   Or  . bisacodyl  10 mg Rectal Daily  . cefUROXime (ZINACEF)  IV  1.5 g Intravenous Q12H  . docusate  200 mg Oral Daily  . furosemide  40 mg Intravenous Once  . furosemide  40 mg Intravenous BID  . insulin aspart  0-24 Units Subcutaneous Q4H  . metoprolol tartrate  12.5 mg Oral BID   Or  . metoprolol tartrate  12.5 mg Per Tube BID  . pantoprazole sodium  40 mg Per Tube Q1200  . potassium chloride  10 mEq Intravenous Q1 Hr x 3  . sodium chloride  10 mL Intravenous Q12H  . sodium chloride  3 mL Intravenous Q12H  . DISCONTD: docusate sodium  200 mg Oral Daily  . DISCONTD: pantoprazole  40 mg Oral Q1200  . DISCONTD: rosuvastatin  40 mg Oral q1800     Ht: 5\' 4"  (162.6 cm)  Wt: 159 lb 2.8 oz (72.2 kg)  Ideal Wt: 54.5 kg % Ideal Wt: 132%  Usual Wt: unable to obtain % Usual Wt: ---  Body mass index is 27.32 kg/(m^2).  Food/Nutrition Related Hx: no problems per nutrition screen  Labs:  CMP     Component Value Date/Time   NA 134* 07/10/2011 0416   K 3.4* 07/10/2011 0416   CL 106 07/10/2011 0416   CO2 22 07/10/2011 0416   GLUCOSE 129* 07/10/2011 0416   BUN  32* 07/10/2011 0416   CREATININE 1.07 07/10/2011 0416   CALCIUM 7.7* 07/10/2011 0416   PROT 4.1* 07/10/2011 0416   ALBUMIN 2.0* 07/10/2011 0416   AST 140* 07/10/2011 0416   ALT 254* 07/10/2011 0416   ALKPHOS 43 07/10/2011 0416   BILITOT 0.5 07/10/2011 0416   GFRNONAA 46* 07/10/2011 0416   GFRAA 54* 07/10/2011 0416    I/O last 3 completed shifts: In: 4047.8 [I.V.:2623.8; UVOZD:6644; NG/GT:170; IV Piggyback:100] Out: 3735 [Urine:1855; Emesis/NG output:400; Chest Tube:1480] Total I/O In: -  Out: 200 [Urine:100; Chest Tube:100]   CBG (last 3)   Basename 07/10/11 0726 07/10/11 0409 07/09/11 2312  GLUCAP 89 128* 143*    Diet Order: NPO  Supplements/Tube Feeding: N/A  IVF:    sodium chloride Last Rate: 20 mL/hr at 07/10/11 1000  sodium chloride Last Rate: 20 mL/hr at 07/10/11 0000  sodium chloride Last Rate: 250 mL (07/10/11 0555)  dexmedetomidine (PRECEDEX) IV infusion Last Rate: 0.3 mcg/kg/hr (07/10/11 1000)  DOPamine Last Rate: 3 mcg/kg/min (  07/10/11 1000)  insulin (NOVOLIN-R) infusion Last Rate: 2.8 Units/hr (07/08/11 1400)  lactated ringers Last Rate: 20 mL/hr at 07/10/11 1000  milrinone Last Rate: 0.3 mcg/kg/min (07/10/11 0900)  nitroGLYCERIN Last Rate: 16.667 mcg/min (07/07/11 2145)  phenylephrine (NEO-SYNEPHRINE) Adult infusion Last Rate: 3 mcg/min (07/10/11 0545)    Estimated Nutritional Needs:   Kcal: 1500-1700 Protein: 80-90 gm Fluid: 1.5-1.7 L  RD unable to obtain nutrition hx at this time; extubated per RT this AM; noted pt with Stage I pressure ulcer per flowsheet records; pt at nutrition risk given post-op state, skin breakdown presence; remains NPO post extubation; would benefit from addition of supplements when/as able  NUTRITION DIAGNOSIS: -Inadequate oral intake (NI-2.1).  Status: Ongoing  RELATED TO: inability to eat  AS EVIDENCE BY: NPO status  MONITORING/EVALUATION(Goals): Goal: meet >90% of estimated nutrition needs to promote healing & recovery Monitor: PO  intake, nutrition support initiation, labs, weight, I/O's  EDUCATION NEEDS: -No education needs identified at this time  INTERVENTION:  Advance diet as medically appropriate  RD to follow for nutrition care plan, add interventions accordingly  Dietitian #: [phone]  DOCUMENTATION CODES Per approved criteria  -Not Applicable    Alger Memos 07/10/2011, 10:50 AM

## 2011-07-10 NOTE — Progress Notes (Signed)
CRITICAL VALUE ALERT  Critical value received:  Hgb= 6.5  Date of notification:  07/10/11  Time of notification:  0515  Critical value read back:yes  Nurse who received alert:  Wilhelmina Mcardle, RN; Ernie Hew, RN  MD notified (1st page):  Dr Tyrone Sage  Time of first page:  0540  MD notified (2nd page):  Time of second page:  Responding MD:  Dr Tyrone Sage  Time MD responded:  [phone]

## 2011-07-11 ENCOUNTER — Inpatient Hospital Stay (HOSPITAL_COMMUNITY): Payer: Medicare Other

## 2011-07-11 LAB — TYPE AND SCREEN
Antibody Screen: NEGATIVE
Unit division: 0
Unit division: 0
Unit division: 0
Unit division: 0

## 2011-07-11 LAB — CBC
HCT: 24.4 % — ABNORMAL LOW (ref 36.0–46.0)
Hemoglobin: 8.5 g/dL — ABNORMAL LOW (ref 12.0–15.0)
MCH: 29.4 pg (ref 26.0–34.0)
MCHC: 34.8 g/dL (ref 30.0–36.0)
MCV: 84.4 fL (ref 78.0–100.0)
Platelets: 71 10*3/uL — ABNORMAL LOW (ref 150–400)
RBC: 2.89 MIL/uL — ABNORMAL LOW (ref 3.87–5.11)
RDW: 15 % (ref 11.5–15.5)
WBC: 11.9 10*3/uL — ABNORMAL HIGH (ref 4.0–10.5)

## 2011-07-11 LAB — BASIC METABOLIC PANEL
BUN: 32 mg/dL — ABNORMAL HIGH (ref 6–23)
CO2: 23 mEq/L (ref 19–32)
Calcium: 7.7 mg/dL — ABNORMAL LOW (ref 8.4–10.5)
Chloride: 105 mEq/L (ref 96–112)
Creatinine, Ser: 0.95 mg/dL (ref 0.50–1.10)
GFR calc Af Amer: 62 mL/min — ABNORMAL LOW (ref >90)
GFR calc non Af Amer: 53 mL/min — ABNORMAL LOW (ref >90)
Glucose, Bld: 122 mg/dL — ABNORMAL HIGH (ref 70–99)
Potassium: 3.6 mEq/L (ref 3.5–5.1)
Sodium: 135 mEq/L (ref 135–145)

## 2011-07-11 LAB — GLUCOSE, CAPILLARY
Glucose-Capillary: 109 mg/dL — ABNORMAL HIGH (ref 70–99)
Glucose-Capillary: 115 mg/dL — ABNORMAL HIGH (ref 70–99)
Glucose-Capillary: 115 mg/dL — ABNORMAL HIGH (ref 70–99)
Glucose-Capillary: 119 mg/dL — ABNORMAL HIGH (ref 70–99)
Glucose-Capillary: 120 mg/dL — ABNORMAL HIGH (ref 70–99)
Glucose-Capillary: 139 mg/dL — ABNORMAL HIGH (ref 70–99)

## 2011-07-11 LAB — PROTIME-INR
INR: 1.23 (ref 0.00–1.49)
Prothrombin Time: 15.8 seconds — ABNORMAL HIGH (ref 11.6–15.2)

## 2011-07-11 MED ORDER — FUROSEMIDE 10 MG/ML IJ SOLN
40.0000 mg | Freq: Once | INTRAMUSCULAR | Status: AC
  Administered 2011-07-11: 40 mg via INTRAVENOUS
  Filled 2011-07-11: qty 4

## 2011-07-11 MED ORDER — DOCUSATE SODIUM 100 MG PO CAPS
200.0000 mg | ORAL_CAPSULE | Freq: Every day | ORAL | Status: DC
  Administered 2011-07-11 – 2011-07-16 (×5): 200 mg via ORAL
  Filled 2011-07-11 (×6): qty 2

## 2011-07-11 MED ORDER — PANTOPRAZOLE SODIUM 40 MG PO TBEC
40.0000 mg | DELAYED_RELEASE_TABLET | Freq: Every day | ORAL | Status: DC
  Administered 2011-07-11 – 2011-07-17 (×7): 40 mg via ORAL
  Filled 2011-07-11 (×7): qty 1

## 2011-07-11 MED ORDER — POTASSIUM CHLORIDE 10 MEQ/50ML IV SOLN
10.0000 meq | INTRAVENOUS | Status: AC
  Administered 2011-07-11 (×3): 10 meq via INTRAVENOUS
  Filled 2011-07-11: qty 150

## 2011-07-11 MED ORDER — TRAMADOL HCL 50 MG PO TABS
50.0000 mg | ORAL_TABLET | Freq: Three times a day (TID) | ORAL | Status: DC | PRN
  Administered 2011-07-11 – 2011-07-13 (×3): 50 mg via ORAL
  Filled 2011-07-11 (×5): qty 1

## 2011-07-11 NOTE — Progress Notes (Signed)
Chaplain followed up from visit with family on Friday (2/1). Chaplain spoke with patient's daughter, [name], who said that she and her brother are coping well and that they are well supported by their local church. Follow up as needed.

## 2011-07-11 NOTE — Progress Notes (Signed)
Patient ID: [name], female   DOB: 1925-02-26, 78 y.o.   MRN: [id number] BP 127/50  Pulse 85  Temp(Src) 98.3 F (36.8 C) (Oral)  Resp 22  Ht 5\' 4"  (1.626 m)  Wt 159 lb 2.8 oz (72.2 kg)  BMI 27.32 kg/m2  SpO2 97% Still very weak Waiting for PT to see to help mobilize [name] MD  Beeper [phone] Office [phone] 07/11/2011 6:27 PM

## 2011-07-11 NOTE — Progress Notes (Signed)
Patient ID: [name], female   DOB: 08-Jul-1924, 78 y.o.   MRN: [id number] TCTS DAILY PROGRESS NOTE                   [address]            [place] [id number]          [phone]      4 Days Post-Op Procedure(s) (LRB): CORONARY ARTERY BYPASS GRAFTING (CABG) (N/A) VENTRICULAR SEPTAL DEFECT (VSD) REPAIR (N/A) EXCISION MASS (N/A)  Total Length of Stay:  LOS: 4 days   Subjective: Up to chair, but co of being weak  Objective: Vital signs in last 24 hours: Temp:  [97.9 F (36.6 C)-99.7 F (37.6 C)] 98.2 F (36.8 C) (02/05 0732) Pulse Rate:  [68-100] 87  (02/05 0700) Cardiac Rhythm:  [-] Normal sinus rhythm (02/05 0600) Resp:  [0-34] 14  (02/05 0700) BP: (100-155)/(36-63) 128/50 mmHg (02/05 0700) SpO2:  [93 %-100 %] 93 % (02/05 0700) Arterial Line BP: (97-177)/(39-59) 134/46 mmHg (02/05 0700) FiO2 (%):  [39.9 %-40.2 %] 39.9 % (02/04 1030) Weight:  [159 lb 2.8 oz (72.2 kg)] 159 lb 2.8 oz (72.2 kg) (02/05 0600)  Filed Weights   07/09/11 0500 07/10/11 0630 07/11/11 0600  Weight: 162 lb 4.1 oz (73.6 kg) 159 lb 2.8 oz (72.2 kg) 159 lb 2.8 oz (72.2 kg)    Weight change: 0 lb (0 kg)   Hemodynamic parameters for last 24 hours: PAP: (33-48)/(10-21) 40/14 mmHg CO:  [5.1 L/min] 5.1 L/min CI:  [2.8 L/min/m2-3 L/min/m2] 3 L/min/m2  Intake/Output from previous day: 02/04 0701 - 02/05 0700 In: 1935.2 [P.O.:150; I.V.:1631.2; IV Piggyback:154] Out: 3030 [Urine:2140; Chest Tube:890]      Current Meds: Scheduled Meds:   . acetaminophen  1,000 mg Oral Q6H   Or  . acetaminophen (TYLENOL) oral liquid 160 mg/5 mL  975 mg Per Tube Q6H  . aspirin EC  325 mg Oral Daily   Or  . aspirin  324 mg Per Tube Daily  . bisacodyl  10 mg Oral Daily   Or  . bisacodyl  10 mg Rectal Daily  . docusate  200 mg Oral Daily  . furosemide  40 mg Intravenous BID  . insulin aspart  0-24 Units Subcutaneous Q4H  . metoprolol tartrate  12.5 mg Oral BID   Or  . metoprolol  tartrate  12.5 mg Per Tube BID  . pantoprazole sodium  40 mg Per Tube Q1200  . potassium chloride  10 mEq Intravenous Q1 Hr x 3  . potassium chloride  10 mEq Intravenous Q1 Hr x 3  . sodium chloride  10 mL Intravenous Q12H  . sodium chloride  3 mL Intravenous Q12H   Continuous Infusions:   . sodium chloride 20 mL/hr at 07/11/11 0700  . sodium chloride 20 mL/hr at 07/10/11 0000  . sodium chloride 250 mL (07/10/11 0555)  . dexmedetomidine (PRECEDEX) IV infusion Stopped (07/10/11 1300)  . DOPamine 3 mcg/kg/min (07/11/11 0700)  . insulin (NOVOLIN-R) infusion 2.8 Units/hr (07/08/11 1400)  . lactated ringers 10 mL/hr at 07/11/11 0700  . milrinone 0.3 mcg/kg/min (07/11/11 0700)  . nitroGLYCERIN 16.667 mcg/min (07/07/11 2145)  . phenylephrine (NEO-SYNEPHRINE) Adult infusion 3 mcg/min (07/10/11 0545)   PRN Meds:.metoprolol, midazolam, morphine, ondansetron (ZOFRAN) IV, oxyCODONE, sodium chloride, sodium chloride  General appearance: alert and cooperative Neurologic: intact Heart: regular rate and rhythm, S1, S2 normal, no murmur, click, rub or gallop Lungs: diminished breath sounds bibasilar Abdomen:  soft, non-tender; bowel sounds normal; no masses,  no organomegaly Extremities: extremities normal, atraumatic, no cyanosis or edema, no edema, redness or tenderness in the calves or thighs and no ulcers, gangrene or trophic changes  Lab Results: CBC: Basename 07/11/11 0410 07/10/11 1720  WBC 11.9* 12.4*  HGB 8.5* 8.7*  HCT 24.4* 24.7*  PLT 71* 53*   BMET:  Basename 07/11/11 0410 07/10/11 1720  NA 135 134*  K 3.6 3.9  CL 105 105  CO2 23 21  GLUCOSE 122* 109*  BUN 32* 32*  CREATININE 0.95 0.92  CALCIUM 7.7* 7.8*    PT/INR:  Basename 07/11/11 0410  LABPROT 15.8*  INR 1.23   Radiology: Dg Chest 1 View  07/10/2011  *RADIOLOGY REPORT*  Clinical Data: Follow-up for surgery.  CHEST - 1 VIEW  Comparison: Multiple priors, most recently chest x-ray 07/09/2011.  Findings: Swan-Ganz  catheter and proximal left main pulmonary artery.  Endotracheal tube approximate 2.5 cm above the carina. Nasogastric tube extends at least to the stomach (below the lower margin of the film).  Bilateral chest tubes in place (unchanged in position).  Status post median sternotomy for CABG and VSD closure.  Lung volumes have decreased slightly.  Increasing bibasilar opacities favored to represent areas of postoperative atelectasis. There is likely a small left-sided pleural effusion.  Opacity in the right apex has decreased, indicative of a decreasing right apical pleural effusion.  Heart size remains mildly enlarged (unchanged).  Mediastinal contours are unchanged.  IMPRESSION:  1.  Support apparatus unchanged. 2.  Worsening bibasilar aeration, likely reflect increasing bibasilar subsegmental atelectasis.  There is also a small left basilar pleural effusion (unchanged) and small right apical pleural effusion (decreased in size).  Original Report Authenticated By: Florencia Reasons, M.D.     Assessment/Plan: S/P Procedure(s) (LRB): CORONARY ARTERY BYPASS GRAFTING (CABG) (N/A) VENTRICULAR SEPTAL DEFECT (VSD) REPAIR (N/A) EXCISION MASS (N/A) Path pending needs PT wean drips    [name] MD  Beeper [phone] Office [phone] 07/11/2011 7:47 AM

## 2011-07-11 NOTE — Progress Notes (Signed)
Patient ID: [name], female   DOB: November 18, 1924, 78 y.o.   MRN: [id number] @ Subjective:  General chest discomfort but doing well  Objective:  Filed Vitals:   07/11/11 0500 07/11/11 0600 07/11/11 0700 07/11/11 0732  BP: 123/46 155/52 128/50   Pulse: 86 100 87   Temp:    98.2 F (36.8 C)  TempSrc:    Oral  Resp: 12 34 14   Height:      Weight:  72.2 kg (159 lb 2.8 oz)    SpO2: 98% 95% 93%     Intake/Output from previous day:  Intake/Output Summary (Last 24 hours) at 07/11/11 0818 Last data filed at 07/11/11 0700  Gross per 24 hour  Intake 1835.2 ml  Output   2960 ml  Net -1124.8 ml    Physical Exam: Intubated Rhonchi mechanical ventilation S1/S2 SEM ? Residual VSD or outflow murmur BS positive Lines in place Plus one PT's Neuro grossly ntact   Lab Results: Basic Metabolic Panel:  Basename 07/11/11 0410 07/10/11 1720 07/08/11 1700  NA 135 134* --  K 3.6 3.9 --  CL 105 105 --  CO2 23 21 --  GLUCOSE 122* 109* --  BUN 32* 32* --  CREATININE 0.95 0.92 --  CALCIUM 7.7* 7.8* --  MG -- -- 2.6*  PHOS -- -- --   Liver Function Tests:  [facility] 07/10/11 0416 07/09/11 0355  AST 140* 286*  ALT 254* 357*  ALKPHOS 43 34*  BILITOT 0.5 0.7  PROT 4.1* 3.7*  ALBUMIN 2.0* 2.1*   No results found for this basename: LIPASE:2,AMYLASE:2 in the last 72 hours CBC:  Basename 07/11/11 0410 07/10/11 1720  WBC 11.9* 12.4*  NEUTROABS -- --  HGB 8.5* 8.7*  HCT 24.4* 24.7*  MCV 84.4 83.7  PLT 71* 53*    Imaging: Dg Chest 1 View  07/10/2011  *RADIOLOGY REPORT*  Clinical Data: Follow-up for surgery.  CHEST - 1 VIEW  Comparison: Multiple priors, most recently chest x-ray 07/09/2011.  Findings: Swan-Ganz catheter and proximal left main pulmonary artery.  Endotracheal tube approximate 2.5 cm above the carina. Nasogastric tube extends at least to the stomach (below the lower margin of the film).  Bilateral chest tubes in place (unchanged in position).  Status post median  sternotomy for CABG and VSD closure.  Lung volumes have decreased slightly.  Increasing bibasilar opacities favored to represent areas of postoperative atelectasis. There is likely a small left-sided pleural effusion.  Opacity in the right apex has decreased, indicative of a decreasing right apical pleural effusion.  Heart size remains mildly enlarged (unchanged).  Mediastinal contours are unchanged.  IMPRESSION:  1.  Support apparatus unchanged. 2.  Worsening bibasilar aeration, likely reflect increasing bibasilar subsegmental atelectasis.  There is also a small left basilar pleural effusion (unchanged) and small right apical pleural effusion (decreased in size).  Original Report Authenticated By: Florencia Reasons, M.D.   Dg Chest Port 1v Same Day  07/11/2011  *RADIOLOGY REPORT*  Clinical Data: Open heart surgery.  Follow-up radiograph.  PORTABLE CHEST - 1 VIEW SAME DAY  Comparison: 07/10/2011.  Findings: Interval extubation.  Removal of enteric tube.  The Theone Murdoch catheter has been removed.  Right IJ vascular sheath remains present.  Bilateral thoracostomy tubes.  Tiny left apical pneumothorax is present.  Low lung volumes are present with increasing bilateral basilar atelectasis and pulmonary vascular congestion.  Small left pleural effusion. CABG.  IMPRESSION: 1.  Interval extubation. 2.  A tiny left apical pneumothorax.  Bilateral thoracostomy tubes remain present. 3.  Low lung volumes with pulmonary vascular congestion, basilar atelectasis and left pleural effusion. 4.  CABG.  Original Report Authenticated By: Andreas Newport, M.D.    Cardiac Studies:    Telemetry: A paced /NSR  Echo:  EF 35% VSD preop   Medications:      . acetaminophen  1,000 mg Oral Q6H   Or  . acetaminophen (TYLENOL) oral liquid 160 mg/5 mL  975 mg Per Tube Q6H  . aspirin EC  325 mg Oral Daily   Or  . aspirin  324 mg Per Tube Daily  . bisacodyl  10 mg Oral Daily   Or  . bisacodyl  10 mg Rectal Daily  . docusate   200 mg Oral Daily  . furosemide  40 mg Intravenous BID  . furosemide  40 mg Intravenous Once  . insulin aspart  0-24 Units Subcutaneous Q4H  . metoprolol tartrate  12.5 mg Oral BID   Or  . metoprolol tartrate  12.5 mg Per Tube BID  . pantoprazole sodium  40 mg Per Tube Q1200  . potassium chloride  10 mEq Intravenous Q1 Hr x 3  . potassium chloride  10 mEq Intravenous Q1 Hr x 3  . sodium chloride  10 mL Intravenous Q12H  . sodium chloride  3 mL Intravenous Q12H        . sodium chloride 20 mL/hr at 07/11/11 0700  . sodium chloride 20 mL/hr at 07/10/11 0000  . sodium chloride 250 mL (07/10/11 0555)  . DOPamine 3 mcg/kg/min (07/11/11 0700)  . insulin (NOVOLIN-R) infusion 2.8 Units/hr (07/08/11 1400)  . lactated ringers 10 mL/hr at 07/11/11 0700  . DISCONTD: dexmedetomidine (PRECEDEX) IV infusion Stopped (07/10/11 1300)  . DISCONTD: milrinone 0.3 mcg/kg/min (07/11/11 0700)  . DISCONTD: nitroGLYCERIN 16.667 mcg/min (07/07/11 2145)  . DISCONTD: phenylephrine (NEO-SYNEPHRINE) Adult infusion 3 mcg/min (07/10/11 0545)    Assessment/Plan:  Ant MI/VSD:  Doing well considering.  VSD  ? Hematoma RUL with continued low Hct.  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

## 2011-07-12 ENCOUNTER — Inpatient Hospital Stay (HOSPITAL_COMMUNITY): Payer: Medicare Other

## 2011-07-12 LAB — GLUCOSE, CAPILLARY
Glucose-Capillary: 101 mg/dL — ABNORMAL HIGH (ref 70–99)
Glucose-Capillary: 103 mg/dL — ABNORMAL HIGH (ref 70–99)
Glucose-Capillary: 135 mg/dL — ABNORMAL HIGH (ref 70–99)
Glucose-Capillary: 92 mg/dL (ref 70–99)

## 2011-07-12 LAB — BASIC METABOLIC PANEL
BUN: 31 mg/dL — ABNORMAL HIGH (ref 6–23)
CO2: 23 mEq/L (ref 19–32)
Calcium: 8.3 mg/dL — ABNORMAL LOW (ref 8.4–10.5)
Chloride: 105 mEq/L (ref 96–112)
Creatinine, Ser: 0.95 mg/dL (ref 0.50–1.10)
GFR calc Af Amer: 62 mL/min — ABNORMAL LOW (ref >90)
GFR calc non Af Amer: 53 mL/min — ABNORMAL LOW (ref >90)
Glucose, Bld: 100 mg/dL — ABNORMAL HIGH (ref 70–99)
Potassium: 3.9 mEq/L (ref 3.5–5.1)
Sodium: 137 mEq/L (ref 135–145)

## 2011-07-12 LAB — CBC
HCT: 27.1 % — ABNORMAL LOW (ref 36.0–46.0)
Hemoglobin: 9.4 g/dL — ABNORMAL LOW (ref 12.0–15.0)
MCH: 29.8 pg (ref 26.0–34.0)
MCHC: 34.7 g/dL (ref 30.0–36.0)
MCV: 86 fL (ref 78.0–100.0)
Platelets: 112 10*3/uL — ABNORMAL LOW (ref 150–400)
RBC: 3.15 MIL/uL — ABNORMAL LOW (ref 3.87–5.11)
RDW: 15.4 % (ref 11.5–15.5)
WBC: 12.2 10*3/uL — ABNORMAL HIGH (ref 4.0–10.5)

## 2011-07-12 MED ORDER — SODIUM CHLORIDE 0.9 % IJ SOLN: 10.0000 mL | INTRAMUSCULAR | Status: DC | PRN

## 2011-07-12 MED ORDER — SODIUM CHLORIDE 0.9 % IJ SOLN
10.0000 mL | Freq: Two times a day (BID) | INTRAMUSCULAR | Status: DC
  Administered 2011-07-13 – 2011-07-17 (×6): 10 mL

## 2011-07-12 MED ORDER — FUROSEMIDE 10 MG/ML IJ SOLN
40.0000 mg | Freq: Two times a day (BID) | INTRAMUSCULAR | Status: DC
  Administered 2011-07-12 – 2011-07-15 (×7): 40 mg via INTRAVENOUS
  Filled 2011-07-12 (×10): qty 4

## 2011-07-12 MED ORDER — ENSURE CLINICAL ST REVIGOR PO LIQD
237.0000 mL | Freq: Three times a day (TID) | ORAL | Status: DC
  Administered 2011-07-12 – 2011-07-18 (×19): 237 mL via ORAL

## 2011-07-12 MED FILL — Mannitol IV Soln 20%: INTRAVENOUS | Qty: 500 | Status: AC

## 2011-07-12 MED FILL — Sodium Chloride IV Soln 0.9%: INTRAVENOUS | Qty: 1000 | Status: AC

## 2011-07-12 MED FILL — Electrolyte-R (PH 7.4) Solution: INTRAVENOUS | Qty: 4000 | Status: AC

## 2011-07-12 MED FILL — Heparin Sodium (Porcine) Inj 1000 Unit/ML: INTRAMUSCULAR | Qty: 10 | Status: AC

## 2011-07-12 MED FILL — Sodium Chloride Irrigation Soln 0.9%: Qty: 3000 | Status: AC

## 2011-07-12 MED FILL — Heparin Sodium (Porcine) Inj 1000 Unit/ML: INTRAMUSCULAR | Qty: 30 | Status: AC

## 2011-07-12 MED FILL — Lidocaine HCl IV Inj 20 MG/ML: INTRAVENOUS | Qty: 5 | Status: AC

## 2011-07-12 MED FILL — Sodium Bicarbonate IV Soln 8.4%: INTRAVENOUS | Qty: 50 | Status: AC

## 2011-07-12 NOTE — Progress Notes (Signed)
CSW received referral for pt needing SNF placement. CSW completed psychosocial assessment (located in shadow chart). Pt states preference for [facility].   CSW will initiate SNF search at this time and will continue to follow to facilitate d/c pt as pt progresses medically.  Baxter Flattery, MSW [phone]

## 2011-07-12 NOTE — Progress Notes (Signed)
Patient examined and record reviewed.Hemodynamics stable,labs satisfactory.Patient had stable day.Continue current care.patient breathing well on room air [name],[name] 07/12/2011

## 2011-07-12 NOTE — Clinical Documentation Improvement (Signed)
CHF DOCUMENTATION CLARIFICATION QUERY  THIS DOCUMENT IS NOT A PERMANENT PART OF THE MEDICAL RECORD  Please update your documentation within the medical record to reflect your response to this query.                                                                                     07/12/11  Dr. Eden Emms and/or Associates,  In a better effort to capture your patient's severity of illness, reflect appropriate length of stay and utilization of resources, a review of the patient medical record has revealed the following indicators regarding the diagnosis of Heart Failure.    Based on your clinical judgment, please document in the progress notes and discharge summary if a condition below provides greater specificity regarding the patient's heart function and fluid status on admission:   - Acute Systolic Heart Failure   - Acute Pulmonary Edema   - Other Condition   - Unable to Clinically Determine  Clinical Indicators and Information:  CXR 07/07/11 Findings: Heart size remains within normal limits. Diffuse  interstitial infiltrates are seen bilaterally, consistent with mild  interstitial edema. No evidence of pulmonary consolidation or  pleural effusion. Intra-aortic balloon pump is seen with tip at  the level of the aortic arch.  IMPRESSION:  Mild diffuse pulmonary interstitial edema.   Cardiac Cath 07/07/11 Left ventriculography: Anterior and apical dyskinesis. Basal hyperdynamic function. Overall EF 35%. Large VSD with dye entering the RV [name]  07/07/2011, 11:54 AM  In responding to this query please exercise your independent judgment.  The fact that a query is asked, does not imply that any particular answer is desired or expected.   Reviewed:  no additional documentation provided  Thank You,  Jerral Ralph  RN BSN Certified Clinical Documentation Specialist: Cell   [phone]  Health Information Management [place]  TO RESPOND TO THE THIS QUERY, FOLLOW  THE INSTRUCTIONS BELOW:  1. If needed, update documentation for the patient's encounter via the notes activity.  2. Access this query again and click edit on the In [employer].  3. After updating, or not, click F2 to complete all highlighted (required) fields concerning your review. Select "additional documentation in the medical record" OR "no additional documentation provided".  4. Click Sign note button.  5. The deficiency will fall out of your In Basket *Please let us know if you are not able to complete this workflow by phone or e-mail (listed below).  Patient admitted with subacute anterior MI with VSD.  Had cardiogenic shock and systolic congestive heart failure.  EF 35%.  Sent to emergency CABG for mechanical complication of MI.  See cath note  [name] 12:14 PM 07/12/2011

## 2011-07-12 NOTE — Evaluation (Signed)
Physical Therapy Evaluation Patient Details Name: [name] MRN: [id number] DOB: Jan 28, 1925 Today's Date: 07/12/2011  Problem List:  Patient Active Problem List  Diagnoses  . Acute anterior wall MI  . HTN (hypertension)  . Osteoarthritis of knee  . CAD (coronary artery disease)  . VSD (ventricular septal defect)    Past Medical History:  Past Medical History  Diagnosis Date  . Acute MI, anterior wall     a. 07/07/2011  . HTN (hypertension)   . Osteoarthritis of knee     Bilateral.  a. s/p LTKA ~2007;  b. s/p RTKA 10/2010  . CAD (coronary artery disease)     a. 07/07/2011 - Occluded LAD  . VSD (ventricular septal defect)     a.  07/07/2011 - Noted @ time of MI   Past Surgical History:  Past Surgical History  Procedure Date  . Joint replacement   . Total knee arthroplasty     a.  Left ~ 2007, Right 10/2010  . Cataract extraction, bilateral   . Coronary artery bypass graft 07/07/2011    Procedure: CORONARY ARTERY BYPASS GRAFTING (CABG);  Surgeon: Kathlee Nations Suann Larry, MD;  Location: [facility] OR;  Service: Open Heart Surgery;  Laterality: N/A;  times two using greater saphenous vein harvested via endovascular vein harvest  . Vsd repair 07/07/2011    Procedure: VENTRICULAR SEPTAL DEFECT (VSD) REPAIR;  Surgeon: Mikey Bussing, MD;  Location: [facility] OR;  Service: Open Heart Surgery;  Laterality: N/A;  with bovine pericardium 4cm x 4cm  . Mass excision 07/07/2011    Procedure: EXCISION MASS;  Surgeon: Kathlee Nations Suann Larry, MD;  Location: [facility] OR;  Service: Open Heart Surgery;  Laterality: N/A;  Excision of mediastinal mass    PT Assessment/Plan/Recommendation PT Assessment Clinical Impression Statement: 78 y/o female w/o prior cardiac hx who presents with acute ant stemi now s/p CABG POD #5. PT consulted for weakness. PT eval now complete. Pt demonstrating generalized weakness and immobility secondary to STEMI and resulting surgery as well as post-op complications. Will benefit physical therapy in  the acute setting for these and the following problem list to maximize mobility and independence so as to decrease burden of care at next venue and accelerate recovery. Pt will need SNF level therapies on d/c. I also recommend an OT consult. PT Recommendation/Assessment: Patient will need skilled PT in the acute care venue PT Problem List: Decreased strength;Decreased activity tolerance;Decreased balance;Decreased mobility;Decreased knowledge of use of DME;Pain;Cardiopulmonary status limiting activity Barriers to Discharge: Decreased caregiver support PT Therapy Diagnosis : Difficulty walking;Abnormality of gait;Generalized weakness;Acute pain PT Plan PT Frequency: Min 3X/week PT Treatment/Interventions: DME instruction;Gait training;Functional mobility training;Therapeutic activities;Therapeutic exercise;Balance training;Patient/family education;Neuromuscular re-education PT Recommendation Recommendations for Other Services: OT consult Follow Up Recommendations: Skilled nursing facility Equipment Recommended: Defer to next venue PT Goals  Acute Rehab PT Goals PT Goal Formulation: With patient Time For Goal Achievement: 2 weeks Pt will Roll Supine to Right Side: with supervision PT Goal: Rolling Supine to Right Side - Progress: Goal set today Pt will Roll Supine to Left Side: with supervision PT Goal: Rolling Supine to Left Side - Progress: Goal set today Pt will go Supine/Side to Sit: with supervision PT Goal: Supine/Side to Sit - Progress: Goal set today Pt will go Sit to Stand: with supervision PT Goal: Sit to Stand - Progress: Goal set today Pt will go Stand to Sit: with supervision PT Goal: Stand to Sit - Progress: Goal set today Pt will Transfer Bed  to Chair/Chair to Bed: with supervision PT Transfer Goal: Bed to Chair/Chair to Bed - Progress: Goal set today Pt will Stand: with supervision;with no upper extremity support;3 - 5 min PT Goal: Stand - Progress: Goal set today Pt will  Ambulate: 51 - 150 feet;with least restrictive assistive device;with supervision PT Goal: Ambulate - Progress: Goal set today Pt will Perform Home Exercise Program: Independently PT Goal: Perform Home Exercise Program - Progress: Goal set today  PT Evaluation Precautions/Restrictions  Precautions Precautions: Fall;Sternal Prior Functioning  Home Living Lives With: Spouse (she is spouses primary caretaker (Alzheimers)) Receives Help From: Family (son and daughter are in town to help) Type of Home: House Home Layout: One level Home Adaptive Equipment: Walker - rolling Prior Function Level of Independence: Independent with basic ADLs;Independent with homemaking with ambulation;Independent with homemaking with wheelchair;Independent with gait;Independent with transfers Driving: Yes Vocation: Retired Comments: 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 and needs physical help at home; has hired help with her husband at night and her son and daughter have been switching off care of her husband; son doesn't live in town is just visiting, daughter lives here and doesn't work Financial risk analyst Arousal/Alertness: Awake/alert Overall Cognitive Status: Appears within functional limits for tasks assessed Orientation Level: Oriented X4 Sensation/Coordination Sensation Light Touch: Appears Intact Stereognosis: Not tested Hot/Cold: Not tested Proprioception: Not tested Coordination Gross Motor Movements are Fluid and Coordinated: Yes Fine Motor Movements are Fluid and Coordinated: Yes Extremity Assessment RUE Assessment RUE Assessment:  (grossly 3/5) LUE Assessment LUE Assessment:  (grossly 3/5) RLE Assessment RLE Assessment:  (grossly 3/5) LLE Assessment LLE Assessment:  (grossly 3/5) Mobility (including Balance) Bed Mobility Bed Mobility: Yes Sit to Supine: 1: +2 Total assist (30%) Sit to Supine - Details (indicate cue type and reason): assist to slowly lower shoulders to bed  and elevate legs to bed; pt holding pillow through transfer Scooting to HOB: 1: +2 Total assist (10%) Transfers Transfers: Yes Sit to Stand: 1: +2 Total assist;From chair/3-in-1 (40%) Sit to Stand Details (indicate cue type and reason): slow to rise from recliner needing faciliation for anterior translation and follow through to extend trunk and bring weight forward; pt initially standing flexed at hips and all weight in heels requring maxA to stay on her feet Stand to Sit: To bed;3: Mod assist Stand to Sit Details: cues for safe technique (hand placement) and to control descent Ambulation/Gait Ambulation/Gait: Yes Ambulation/Gait Assistance: 1: +2 Total assist (60%) Ambulation/Gait Assistance Details (indicate cue type and reason): amb approx 15 ft while pushing w/c (used w/c because of chest tubes and other cords but it was too tall for her, need to try RW); cueing for upright posture, to step closer to RW, forward gaze and sequencing with feet especially during turns; pt became very impulsive towards the end because she was getting significantly fatigued; HR only rose to low 90s during gait Ambulation Distance (Feet): 15 Feet Assistive device:  (pushing w/c) Gait Pattern: Trunk flexed;Decreased hip/knee flexion - left;Decreased hip/knee flexion - right;Shuffle  Posture/Postural Control Posture/Postural Control: Postural limitations Postural Limitations: with fatigue pt very flexed with poor endurance for upright posture Balance Balance Assessed: Yes Static Standing Balance Static Standing - Level of Assistance: 1: +2 Total assist Static Standing - Comment/# of Minutes: pt stood with posterior lean and flexed trunk needing +2totalpt60%; this improved some with faciliation and cueing for PA weight shift but with fatigue pt collapses into flexion again Exercise  General Exercises - Lower Extremity Ankle Circles/Pumps: AAROM;10  reps;Supine;Both Heel Slides: AAROM;5 reps;Supine;Both Hip  ABduction/ADduction: AAROM;Both;5 reps;Supine End of Session PT - End of Session Equipment Utilized During Treatment: Gait belt Activity Tolerance: Patient limited by fatigue Patient left: in bed;with call bell in reach Nurse Communication: Mobility status for transfers;Mobility status for ambulation General Behavior During Session: [facility] for tasks performed Cognition: [facility] for tasks performed  [facility] HELEN 07/12/2011, 1:21 PM

## 2011-07-12 NOTE — Progress Notes (Signed)
Patient ID: [name], female   DOB: 1925/04/10, 78 y.o.   MRN: [id number] TCTS DAILY PROGRESS NOTE                   [address]            [place] [address]          [phone]      5 Days Post-Op Procedure(s) (LRB): CORONARY ARTERY BYPASS GRAFTING (CABG) (N/A) VENTRICULAR SEPTAL DEFECT (VSD) REPAIR (N/A) EXCISION MASS (N/A)  Total Length of Stay:  LOS: 5 days   Subjective: Awake and alert, but very weak. Needs help to stand  Objective: Vital signs in last 24 hours: Temp:  [97.3 F (36.3 C)-98.6 F (37 C)] 97.7 F (36.5 C) (02/06 0726) Pulse Rate:  [79-98] 86  (02/06 0700) Cardiac Rhythm:  [-] Normal sinus rhythm (02/06 0600) Resp:  [18-29] 27  (02/06 0700) BP: (112-141)/(43-103) 134/57 mmHg (02/06 0700) SpO2:  [94 %-98 %] 98 % (02/06 0700) Arterial Line BP: (145)/(50) 145/50 mmHg (02/05 0900) Weight:  [153 lb 14.1 oz (69.8 kg)] 153 lb 14.1 oz (69.8 kg) (02/06 0600)  Filed Weights   07/10/11 0630 07/11/11 0600 07/12/11 0600  Weight: 159 lb 2.8 oz (72.2 kg) 159 lb 2.8 oz (72.2 kg) 153 lb 14.1 oz (69.8 kg)    Weight change: -5 lb 4.7 oz (-2.4 kg)       Intake/Output from previous day: 02/05 0701 - 02/06 0700 In: 986.6 [I.V.:886.6; IV Piggyback:100] Out: 2880 [Urine:2080; Emesis/NG output:50; Chest Tube:750]      Current Meds: Scheduled Meds:   . acetaminophen  1,000 mg Oral Q6H   Or  . acetaminophen (TYLENOL) oral liquid 160 mg/5 mL  975 mg Per Tube Q6H  . aspirin EC  325 mg Oral Daily   Or  . aspirin  324 mg Per Tube Daily  . bisacodyl  10 mg Oral Daily   Or  . bisacodyl  10 mg Rectal Daily  . docusate sodium  200 mg Oral Daily  . feeding supplement  237 mL Oral TID WC  . furosemide  40 mg Intravenous Once  . furosemide  40 mg Intravenous Q12H  . insulin aspart  0-24 Units Subcutaneous Q4H  . metoprolol tartrate  12.5 mg Oral BID   Or  . metoprolol tartrate  12.5 mg Per Tube BID  . pantoprazole  40 mg Oral Q1200  .  potassium chloride  10 mEq Intravenous Q1 Hr x 3  . sodium chloride  10 mL Intravenous Q12H  . sodium chloride  3 mL Intravenous Q12H  . DISCONTD: docusate  200 mg Oral Daily  . DISCONTD: pantoprazole sodium  40 mg Per Tube Q1200   Continuous Infusions:   . sodium chloride 20 mL/hr at 07/11/11 0700  . sodium chloride 20 mL/hr at 07/10/11 0000  . sodium chloride 250 mL (07/10/11 0555)  . insulin (NOVOLIN-R) infusion 2.8 Units/hr (07/08/11 1400)  . lactated ringers 10 mL/hr at 07/11/11 0700  . DISCONTD: DOPamine 3 mcg/kg/min (07/11/11 1800)   PRN Meds:.metoprolol, ondansetron (ZOFRAN) IV, sodium chloride, sodium chloride, traMADol, DISCONTD: midazolam, DISCONTD: morphine, DISCONTD: oxyCODONE  General appearance: alert, cooperative and no distress Neurologic: intact Heart: systolic murmur: early systolic 2/6, soft at apex Lungs: clear to auscultation bilaterally Abdomen: soft, non-tender; bowel sounds normal; no masses,  no organomegaly Extremities: extremities normal, atraumatic, no cyanosis or edema, Homans sign is negative, no sign of DVT and no edema, redness or  tenderness in the calves or thighs Wound: sternum stable  Lab Results: CBC: Basename 07/12/11 0415 07/11/11 0410  WBC 12.2* 11.9*  HGB 9.4* 8.5*  HCT 27.1* 24.4*  PLT 112* 71*   BMET:  Basename 07/12/11 0415 07/11/11 0410  NA 137 135  K 3.9 3.6  CL 105 105  CO2 23 23  GLUCOSE 100* 122*  BUN 31* 32*  CREATININE 0.95 0.95  CALCIUM 8.3* 7.7*    PT/INR:  Basename 07/11/11 0410  LABPROT 15.8*  INR 1.23   Radiology: Dg Chest Port 1v Same Day  07/11/2011  *RADIOLOGY REPORT*  Clinical Data: Open heart surgery.  Follow-up radiograph.  PORTABLE CHEST - 1 VIEW SAME DAY  Comparison: 07/10/2011.  Findings: Interval extubation.  Removal of enteric tube.  The Theone Murdoch catheter has been removed.  Right IJ vascular sheath remains present.  Bilateral thoracostomy tubes.  Tiny left apical pneumothorax is present.  Low lung  volumes are present with increasing bilateral basilar atelectasis and pulmonary vascular congestion.  Small left pleural effusion. CABG.  IMPRESSION: 1.  Interval extubation. 2.  A tiny left apical pneumothorax.  Bilateral thoracostomy tubes remain present. 3.  Low lung volumes with pulmonary vascular congestion, basilar atelectasis and left pleural effusion. 4.  CABG.  Original Report Authenticated By: Andreas Newport, M.D.     Assessment/Plan: S/P Procedure(s) (LRB): CORONARY ARTERY BYPASS GRAFTING (CABG) (N/A) VENTRICULAR SEPTAL DEFECT (VSD) REPAIR (N/A) EXCISION MASS (N/A) Mobilize Diuresis FU echo at some point Still waiting for path results, then oncology input    [name] MD  Beeper [phone] Office [phone] 07/12/2011 8:08 AM

## 2011-07-13 ENCOUNTER — Inpatient Hospital Stay (HOSPITAL_COMMUNITY): Payer: Medicare Other

## 2011-07-13 DIAGNOSIS — R5381 Other malaise: Secondary | ICD-10-CM

## 2011-07-13 LAB — GLUCOSE, CAPILLARY
Glucose-Capillary: 110 mg/dL — ABNORMAL HIGH (ref 70–99)
Glucose-Capillary: 117 mg/dL — ABNORMAL HIGH (ref 70–99)
Glucose-Capillary: 144 mg/dL — ABNORMAL HIGH (ref 70–99)
Glucose-Capillary: 160 mg/dL — ABNORMAL HIGH (ref 70–99)
Glucose-Capillary: 164 mg/dL — ABNORMAL HIGH (ref 70–99)
Glucose-Capillary: 180 mg/dL — ABNORMAL HIGH (ref 70–99)
Glucose-Capillary: 184 mg/dL — ABNORMAL HIGH (ref 70–99)
Glucose-Capillary: 218 mg/dL — ABNORMAL HIGH (ref 70–99)

## 2011-07-13 LAB — CBC
HCT: 27.5 % — ABNORMAL LOW (ref 36.0–46.0)
Hemoglobin: 9.4 g/dL — ABNORMAL LOW (ref 12.0–15.0)
MCH: 29.5 pg (ref 26.0–34.0)
MCHC: 34.2 g/dL (ref 30.0–36.0)
MCV: 86.2 fL (ref 78.0–100.0)
Platelets: 181 10*3/uL (ref 150–400)
RBC: 3.19 MIL/uL — ABNORMAL LOW (ref 3.87–5.11)
RDW: 15.3 % (ref 11.5–15.5)
WBC: 13.3 10*3/uL — ABNORMAL HIGH (ref 4.0–10.5)

## 2011-07-13 LAB — BASIC METABOLIC PANEL
BUN: 30 mg/dL — ABNORMAL HIGH (ref 6–23)
CO2: 26 mEq/L (ref 19–32)
Calcium: 7.9 mg/dL — ABNORMAL LOW (ref 8.4–10.5)
Chloride: 104 mEq/L (ref 96–112)
Creatinine, Ser: 0.97 mg/dL (ref 0.50–1.10)
GFR calc Af Amer: 60 mL/min — ABNORMAL LOW (ref >90)
GFR calc non Af Amer: 52 mL/min — ABNORMAL LOW (ref >90)
Glucose, Bld: 126 mg/dL — ABNORMAL HIGH (ref 70–99)
Potassium: 3.3 mEq/L — ABNORMAL LOW (ref 3.5–5.1)
Sodium: 138 mEq/L (ref 135–145)

## 2011-07-13 MED ORDER — POTASSIUM CHLORIDE 10 MEQ/50ML IV SOLN
10.0000 meq | INTRAVENOUS | Status: AC | PRN
  Administered 2011-07-13 – 2011-07-14 (×4): 10 meq via INTRAVENOUS
  Filled 2011-07-13: qty 100

## 2011-07-13 MED ORDER — POTASSIUM CHLORIDE 10 MEQ/50ML IV SOLN
INTRAVENOUS | Status: AC
  Administered 2011-07-13: 10 meq via INTRAVENOUS
  Filled 2011-07-13: qty 50

## 2011-07-13 MED ORDER — TRAMADOL HCL 50 MG PO TABS
50.0000 mg | ORAL_TABLET | Freq: Four times a day (QID) | ORAL | Status: DC | PRN
  Administered 2011-07-13 – 2011-07-16 (×8): 50 mg via ORAL
  Filled 2011-07-13 (×8): qty 1

## 2011-07-13 MED FILL — Lactated Ringer's Solution: INTRAVENOUS | Qty: 500 | Status: AC

## 2011-07-13 MED FILL — Heparin Sodium (Porcine) Inj 1000 Unit/ML: INTRAMUSCULAR | Qty: 10 | Status: AC

## 2011-07-13 MED FILL — Magnesium Sulfate Inj 50%: INTRAMUSCULAR | Qty: 10 | Status: AC

## 2011-07-13 MED FILL — Nitroglycerin IV Soln 5 MG/ML: INTRAVENOUS | Qty: 10 | Status: AC

## 2011-07-13 MED FILL — Verapamil HCl IV Soln 2.5 MG/ML: INTRAVENOUS | Qty: 4 | Status: AC

## 2011-07-13 MED FILL — Dexmedetomidine HCl IV Soln 200 MCG/2ML: INTRAVENOUS | Qty: 2 | Status: AC

## 2011-07-13 MED FILL — Potassium Chloride Inj 2 mEq/ML: INTRAVENOUS | Qty: 40 | Status: AC

## 2011-07-13 MED FILL — Dextrose Inj 5%: INTRAVENOUS | Qty: 50 | Status: AC

## 2011-07-13 NOTE — Progress Notes (Signed)
Patient ID: [name], female   DOB: [birth year], 78 y.o.   MRN: [id number] @ Subjective:  Denies SSCP, palpitations or Dyspnea   Objective:  Filed Vitals:   07/13/11 0700 07/13/11 0726 07/13/11 0800 07/13/11 0900  BP: 120/52  120/53 132/70  Pulse: 89  88 94  Temp:  97.6 F (36.4 C)    TempSrc:  Oral    Resp: 22  21 28   Height:      Weight:      SpO2: 96%  95% 96%    Intake/Output from previous day:  Intake/Output Summary (Last 24 hours) at 07/13/11 1039 Last data filed at 07/13/11 0900  Gross per 24 hour  Intake   1092 ml  Output   2895 ml  Net  -1803 ml    Physical Exam: Chronically ill elderly female More tachypnic Rales at base SEM thoughout precordium No edema Neuro nonfocal  Lab Results: Basic Metabolic Panel:  Basename 07/13/11 0406 07/12/11 0415  NA 138 137  K 3.3* 3.9  CL 104 105  CO2 26 23  GLUCOSE 126* 100*  BUN 30* 31*  CREATININE 0.97 0.95  CALCIUM 7.9* 8.3*  MG -- --  PHOS -- --   Liver Function Tests: No results found for this basename: AST:2,ALT:2,ALKPHOS:2,BILITOT:2,PROT:2,ALBUMIN:2 in the last 72 hours No results found for this basename: LIPASE:2,AMYLASE:2 in the last 72 hours CBC:  Basename 07/13/11 0406 07/12/11 0415  WBC 13.3* 12.2*  NEUTROABS -- --  HGB 9.4* 9.4*  HCT 27.5* 27.1*  MCV 86.2 86.0  PLT 181 112*    Imaging: Dg Chest Port 1v Same Day  07/13/2011  *RADIOLOGY REPORT*  Clinical Data: Postop open heart surgery, follow-up  PORTABLE CHEST - 1 VIEW SAME DAY  Comparison: Portable chest x-ray of 07/12/2011  Findings: Bilateral chest tubes are present and there may be a tiny left apical pneumothorax.  Bilateral pleural effusions present with basilar atelectasis.  Cardiomegaly is stable.  Right PICC line is noted with the tip in the lower SVC.  IMPRESSION: No change in poor aeration and basilar atelectasis with effusions. Bilateral chest tubes. Small left apical pneumothorax.  Original Report Authenticated By: Juline Patch,  M.D.   Dg Chest Port 1v Same Day  07/12/2011  *RADIOLOGY REPORT*  Clinical Data: Follow-up surgery.  Shortness of breath.  PORTABLE CHEST - 1 VIEW SAME DAY  Comparison: Chest x-ray 07/11/2011.  Findings: Bilateral chest tubes appear unchanged in position with tip and sideport projecting over the thorax. Right internal jugular central venous catheter with tip terminating in the distal superior vena cava (unchanged).  Epicardial pacing wires noted.  There is increasing opacification in the right mid and lower hemithorax, likely reflecting increasing pleural effusion.  Bibasilar opacities also likely reflect areas of atelectasis (superimposed air space consolidation is difficult to exclude, but is not strongly favored).  No definite pneumothorax noted on today's examination. Heart size and mediastinal contours are unremarkable.  Status post median sternotomy.  IMPRESSION:  1.  Support apparatus, as above. 2.  Interval resolution of left pneumothorax. 3.  Increasing right-sided pleural effusion (now moderate).  Small left effusion unchanged. 4.  Persistent bibasilar opacities favored to represent atelectasis.  Original Report Authenticated By: Florencia Reasons, M.D.    Cardiac Studies:  ECG:    Telemetry:  NSR no afib  Echo: Preop EF 35% apical VSD  Medications:     . acetaminophen  1,000 mg Oral Q6H   Or  . acetaminophen (TYLENOL) oral liquid 160  mg/5 mL  975 mg Per Tube Q6H  . aspirin EC  325 mg Oral Daily   Or  . aspirin  324 mg Per Tube Daily  . bisacodyl  10 mg Oral Daily   Or  . bisacodyl  10 mg Rectal Daily  . docusate sodium  200 mg Oral Daily  . feeding supplement  237 mL Oral TID WC  . furosemide  40 mg Intravenous BID  . insulin aspart  0-24 Units Subcutaneous Q4H  . metoprolol tartrate  12.5 mg Oral BID   Or  . metoprolol tartrate  12.5 mg Per Tube BID  . pantoprazole  40 mg Oral Q1200  . sodium chloride  10 mL Intravenous Q12H  . sodium chloride  10-40 mL Intracatheter Q12H   . DISCONTD: sodium chloride  3 mL Intravenous Q12H       . sodium chloride 20 mL/hr at 07/11/11 0700  . sodium chloride 20 mL/hr at 07/13/11 0900  . sodium chloride 250 mL (07/13/11 0430)  . insulin (NOVOLIN-R) infusion 2.8 Units/hr (07/08/11 1400)  . lactated ringers 10 mL/hr at 07/11/11 0700    Assessment/Plan:  CABG/VSD:  Echo before D/C to assess murmur and see if residual VSD.   Thymoma:  ? 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  F/U radiation oncology if  She is D/C and does ok Anemia:  Improving no evidence of ongoing blood loss in mediatinum or lungs  [name] 07/13/2011, 10:39 AM

## 2011-07-13 NOTE — Progress Notes (Signed)
Patient ID: [name], female   DOB: [birth year], 78 y.o.   MRN: [id number] TCTS DAILY PROGRESS NOTE                   [address]            [place] [address]          [phone]      6 Days Post-Op Procedure(s) (LRB): CORONARY ARTERY BYPASS GRAFTING (CABG) (N/A) VENTRICULAR SEPTAL DEFECT (VSD) REPAIR (N/A) EXCISION MASS (N/A)  Total Length of Stay:  LOS: 6 days   Subjective: Weak but alert and neuro intact  Objective: Vital signs in last 24 hours: Temp:  [97.2 F (36.2 C)-98.4 F (36.9 C)] 97.6 F (36.4 C) (02/07 0726) Pulse Rate:  [82-95] 88  (02/07 0800) Cardiac Rhythm:  [-] Normal sinus rhythm (02/07 0800) Resp:  [0-40] 21  (02/07 0800) BP: (109-156)/(47-82) 120/53 mmHg (02/07 0800) SpO2:  [93 %-100 %] 95 % (02/07 0800) Weight:  [151 lb 3.8 oz (68.6 kg)] 151 lb 3.8 oz (68.6 kg) (02/07 0500)  Filed Weights   07/11/11 0600 07/12/11 0600 07/13/11 0500  Weight: 159 lb 2.8 oz (72.2 kg) 153 lb 14.1 oz (69.8 kg) 151 lb 3.8 oz (68.6 kg)    Weight change: -2 lb 10.3 oz (-1.2 kg)   Hemodynamic parameters for last 24 hours:    Intake/Output from previous day: 02/06 0701 - 02/07 0700 In: 1516 [P.O.:960; I.V.:490; IV Piggyback:66] Out: 3045 [Urine:2565; Chest Tube:480]  Intake/Output this shift: Total I/O In: 50 [IV Piggyback:50] Out: 30 [Urine:30]  Current Meds: Scheduled Meds:   . acetaminophen  1,000 mg Oral Q6H   Or  . acetaminophen (TYLENOL) oral liquid 160 mg/5 mL  975 mg Per Tube Q6H  . aspirin EC  325 mg Oral Daily   Or  . aspirin  324 mg Per Tube Daily  . bisacodyl  10 mg Oral Daily   Or  . bisacodyl  10 mg Rectal Daily  . docusate sodium  200 mg Oral Daily  . feeding supplement  237 mL Oral TID WC  . furosemide  40 mg Intravenous BID  . insulin aspart  0-24 Units Subcutaneous Q4H  . metoprolol tartrate  12.5 mg Oral BID   Or  . metoprolol tartrate  12.5 mg Per Tube BID  . pantoprazole  40 mg Oral Q1200  . sodium  chloride  10 mL Intravenous Q12H  . sodium chloride  10-40 mL Intracatheter Q12H  . DISCONTD: sodium chloride  3 mL Intravenous Q12H   Continuous Infusions:   . sodium chloride 20 mL/hr at 07/11/11 0700  . sodium chloride 20 mL/hr at 07/13/11 0700  . sodium chloride 250 mL (07/13/11 0430)  . insulin (NOVOLIN-R) infusion 2.8 Units/hr (07/08/11 1400)  . lactated ringers 10 mL/hr at 07/11/11 0700   PRN Meds:.metoprolol, ondansetron (ZOFRAN) IV, potassium chloride, sodium chloride, sodium chloride, traMADol, DISCONTD: sodium chloride  General appearance: alert, cooperative and no distress Neurologic: intact Heart: systolic murmur: early systolic 2/6, soft at apex Lungs: clear to auscultation bilaterally Abdomen: soft, non-tender; bowel sounds normal; no masses,  no organomegaly Wound: stable  Lab Results: CBC: Basename 07/13/11 0406 07/12/11 0415  WBC 13.3* 12.2*  HGB 9.4* 9.4*  HCT 27.5* 27.1*  PLT 181 112*   BMET:  Basename 07/13/11 0406 07/12/11 0415  NA 138 137  K 3.3* 3.9  CL 104 105  CO2 26 23  GLUCOSE 126* 100*  BUN 30*  31*  CREATININE 0.97 0.95  CALCIUM 7.9* 8.3*    PT/INR:  Basename 07/11/11 0410  LABPROT 15.8*  INR 1.23   Radiology: Dg Chest Port 1v Same Day  07/13/2011  *RADIOLOGY REPORT*  Clinical Data: Postop open heart surgery, follow-up  PORTABLE CHEST - 1 VIEW SAME DAY  Comparison: Portable chest x-ray of 07/12/2011  Findings: Bilateral chest tubes are present and there may be a tiny left apical pneumothorax.  Bilateral pleural effusions present with basilar atelectasis.  Cardiomegaly is stable.  Right PICC line is noted with the tip in the lower SVC.  IMPRESSION: No change in poor aeration and basilar atelectasis with effusions. Bilateral chest tubes. Small left apical pneumothorax.  Original Report Authenticated By: Juline Patch, M.D.   Dg Chest Port 1v Same Day  07/12/2011  *RADIOLOGY REPORT*  Clinical Data: Follow-up surgery.  Shortness of breath.   PORTABLE CHEST - 1 VIEW SAME DAY  Comparison: Chest x-ray 07/11/2011.  Findings: Bilateral chest tubes appear unchanged in position with tip and sideport projecting over the thorax. Right internal jugular central venous catheter with tip terminating in the distal superior vena cava (unchanged).  Epicardial pacing wires noted.  There is increasing opacification in the right mid and lower hemithorax, likely reflecting increasing pleural effusion.  Bibasilar opacities also likely reflect areas of atelectasis (superimposed air space consolidation is difficult to exclude, but is not strongly favored).  No definite pneumothorax noted on today's examination. Heart size and mediastinal contours are unremarkable.  Status post median sternotomy.  IMPRESSION:  1.  Support apparatus, as above. 2.  Interval resolution of left pneumothorax. 3.  Increasing right-sided pleural effusion (now moderate).  Small left effusion unchanged. 4.  Persistent bibasilar opacities favored to represent atelectasis.  Original Report Authenticated By: Florencia Reasons, M.D.     Assessment/Plan: S/P Procedure(s) (LRB): CORONARY ARTERY BYPASS GRAFTING (CABG) (N/A) VENTRICULAR SEPTAL DEFECT (VSD) REPAIR (N/A) EXCISION MASS (N/A) Mobilize Diuresis d/c tubes/lines rehab consult Path reviewed and discussed with patient , discussed at cancer conference consider Radiation later    [name] MD  Beeper [phone] Office [phone] 07/13/2011 9:07 AM

## 2011-07-13 NOTE — Clinical Documentation Improvement (Signed)
Abnormal Labs Clarification  THIS DOCUMENT IS NOT A PERMANENT PART OF THE MEDICAL RECORD   Please update your documentation within the medical record to reflect your response to this query.                                                                                   07/13/11  Dr. Tyrone Sage and/or Associates,  In a better effort to capture your patient's severity of illness, reflect appropriate length of stay and utilization of resources, a review of the medical record has revealed the following indicators.    Abnormal findings (laboratory, x-ray, pathologic, and other diagnostic results) are not coded and reported unless the physician indicates their clinical significance.   Based on your clinical judgment, please document the clinical significance or associated condition represented by the abnormal lab results:  Clinical Information:  AST results this admission: 146;  286;  140  ALT results this admission: 41;   357;   254   In responding to this query please exercise your independent judgment.  The fact that a query is asked, does not imply that any particular answer is desired or expected. Reviewed:  no additional documentation provided   Thank You,  Jerral Ralph  Clinical Documentation Specialist:  Pager  Health Information Management [place]   TO RESPOND TO THE THIS QUERY, FOLLOW THE INSTRUCTIONS BELOW:  1. If needed, update documentation for the patient's encounter via the notes activity.  2. Access this query again and click edit on the [employer].  3. After updating, or not, click F2 to complete all highlighted (required) fields concerning your review. Select "additional documentation in the medical record" OR "no additional documentation provided".  4. Click Sign note button.  5. The deficiency will fall out of your InBasket *Please let us know if you are not able to complete this workflow by phone or e-mail (listed below).

## 2011-07-13 NOTE — Consult Note (Signed)
Physical Medicine and Rehabilitation Consult Reason for Consult: Deconditioning Referring Phsyician:  Dr. Tyrone Sage    HPI: [name] is an 78 y.o. female with history of HTN, 3 week history of rest and exertional dyspnea, admitted 02/01 with sudden onset of chest pain with nausea, vomiting and dyspnea the night PTA with anterior STEMI. Patient taken to cath lab emergently by Dr. Eden Emms.  This revealed large anterior MI with mechanical complication of VSD.  Dr Tyrone Sage consulted and patient taken to OR the same day for CABG X2, VSD repair and excision of mediastinal mass.  Post op abla  Treated with transfusion. Extubated 02/04. 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 PT evaluation done yesterday and patient noted to be limited by multiple lines and deconditioning.  MD recommending CIR>  Review of Systems  Eyes: Negative for blurred vision and double vision.  Respiratory: Positive for cough.   Cardiovascular: Positive for chest pain (chest wall pain.).  Gastrointestinal: Positive for diarrhea. Negative for heartburn and nausea.  Genitourinary: Negative for urgency and frequency.  Musculoskeletal: Positive for myalgias.  Neurological: Negative for headaches.  All other systems reviewed and are negative.   Past Medical History  Diagnosis Date  . Acute MI, anterior wall     a. 07/07/2011  . HTN (hypertension)   . Osteoarthritis of knee     Bilateral.  a. s/p LTKA ~2007;  b. s/p RTKA 10/2010  . CAD (coronary artery disease)     a. 07/07/2011 - Occluded LAD  . VSD (ventricular septal defect)     a.  07/07/2011 - Noted @ time of MI   Past Surgical History  Procedure Date  . Joint replacement   . Total knee arthroplasty     a.  Left ~ 2007, Right 10/2010  . Cataract extraction, bilateral   . Coronary artery bypass graft 07/07/2011    Procedure: CORONARY ARTERY BYPASS GRAFTING (CABG);  Surgeon: Kathlee Nations Suann Larry, MD;  Location: [facility] OR;  Service: Open Heart Surgery;  Laterality: N/A;   times two using greater saphenous vein harvested via endovascular vein harvest  . Vsd repair 07/07/2011    Procedure: VENTRICULAR SEPTAL DEFECT (VSD) REPAIR;  Surgeon: Mikey Bussing, MD;  Location: [facility] OR;  Service: Open Heart Surgery;  Laterality: N/A;  with bovine pericardium 4cm x 4cm  . Mass excision 07/07/2011    Procedure: EXCISION MASS;  Surgeon: Kathlee Nations Suann Larry, MD;  Location: [facility] OR;  Service: Open Heart Surgery;  Laterality: N/A;  Excision of mediastinal mass   History reviewed. No pertinent family history.  Social History: Married. 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 Needs assistance with ADLs and  has hired assistance for him at nights.   She reports that she has never smoked. She has never used smokeless tobacco. She reports that she does not drink alcohol or use illicit drugs. Patient plans on hiring 24 hours assist for husband.   No Known Allergies  Prior to Admission medications   Medication Sig Start Date End Date Taking? Authorizing Provider  carvedilol (COREG) 3.125 MG tablet Take 3.125 mg by mouth daily with breakfast.   Yes Historical Provider, MD  hydrochlorothiazide (MICROZIDE) 12.5 MG capsule Take 12.5 mg by mouth daily.   Yes Historical Provider, MD  Ibuprofen-Diphenhydramine HCl (ADVIL PM) 200-25 MG CAPS Take 1 capsule by mouth at bedtime.   Yes Historical Provider, MD   Scheduled Medications:    . acetaminophen  1,000 mg Oral Q6H   Or  . acetaminophen (  TYLENOL) oral liquid 160 mg/5 mL  975 mg Per Tube Q6H  . aspirin EC  325 mg Oral Daily   Or  . aspirin  324 mg Per Tube Daily  . bisacodyl  10 mg Oral Daily   Or  . bisacodyl  10 mg Rectal Daily  . docusate sodium  200 mg Oral Daily  . feeding supplement  237 mL Oral TID WC  . furosemide  40 mg Intravenous BID  . insulin aspart  0-24 Units Subcutaneous Q4H  . metoprolol tartrate  12.5 mg Oral BID   Or  . metoprolol tartrate  12.5 mg Per Tube BID  . pantoprazole  40 mg Oral Q1200  . sodium  chloride  10 mL Intravenous Q12H  . sodium chloride  10-40 mL Intracatheter Q12H  . DISCONTD: sodium chloride  3 mL Intravenous Q12H   PRN MED's: metoprolol, ondansetron (ZOFRAN) IV, potassium chloride, sodium chloride, sodium chloride, traMADol, DISCONTD: sodium chloride Home: Home Living Lives With: Spouse (she is spouses primary caretaker (Alzheimers)) Receives Help From: Family (son and daughter are in town to help) Type of Home: House Home Layout: One level Home Adaptive Equipment: Walker - rolling  Functional History: Prior Function Level of Independence: Independent with basic ADLs;Independent with homemaking with ambulation;Independent with homemaking with wheelchair;Independent with gait;Independent with transfers Driving: Yes Vocation: Retired Comments: 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 and needs physical help at home; has hired help with her husband at night and her son and daughter have been switching off care of her husband; son doesn't live in town is just visiting, daughter lives here and doesn't work Functional Status:  Mobility: Bed Mobility Bed Mobility: Yes Sit to Supine: 1: +2 Total assist (30%) Sit to Supine - Details (indicate cue type and reason): assist to slowly lower shoulders to bed and elevate legs to bed; pt holding pillow through transfer Scooting to [facility]: 1: +2 Total assist (10%) Transfers Transfers: Yes Sit to Stand: 1: +2 Total assist;From chair/3-in-1 (40%) Sit to Stand Details (indicate cue type and reason): slow to rise from recliner needing faciliation for anterior translation and follow through to extend trunk and bring weight forward; pt initially standing flexed at hips and all weight in heels requring maxA to stay on her feet Stand to Sit: To bed;3: Mod assist Stand to Sit Details: cues for safe technique (hand placement) and to control descent Ambulation/Gait Ambulation/Gait: Yes Ambulation/Gait Assistance: 1: +2 Total assist  (60%) Ambulation/Gait Assistance Details (indicate cue type and reason): amb approx 15 ft while pushing w/c (used w/c because of chest tubes and other cords but it was too tall for her, need to try RW); cueing for upright posture, to step closer to RW, forward gaze and sequencing with feet especially during turns; pt became very impulsive towards the end because she was getting significantly fatigued; HR only rose to low 90s during gait Ambulation Distance (Feet): 15 Feet Assistive device:  (pushing w/c) Gait Pattern: Trunk flexed;Decreased hip/knee flexion - left;Decreased hip/knee flexion - right;Shuffle    ADL:    Cognition: Cognition Arousal/Alertness: Awake/alert Orientation Level: Oriented to person;Oriented to place;Oriented to situation Cognition Arousal/Alertness: Awake/alert Overall Cognitive Status: Appears within functional limits for tasks assessed Orientation Level: Oriented to person;Oriented to place;Oriented to situation  Blood pressure 120/53, pulse 88, temperature 97.6 F (36.4 C), temperature source Oral, resp. rate 21, height 5\' 4"  (1.626 m), weight 68.6 kg (151 lb 3.8 oz), SpO2 95.00%. Physical Exam  Nursing note and vitals reviewed.  Constitutional: She is oriented to person, place, and time. She appears well-developed and well-nourished.  HENT:  Head: Normocephalic and atraumatic.  Eyes: Pupils are equal, round, and reactive to light.  Neck: Normal range of motion. Neck supple.  Cardiovascular: Normal rate and regular rhythm.   Pulmonary/Chest: Effort normal. She has wheezes. She exhibits tenderness.  Abdominal: Soft. Bowel sounds are normal.  Musculoskeletal: Normal range of motion.  Neurological: She is alert and oriented to person, place, and time.  Skin: Skin is warm and dry.  Psychiatric: She has a normal mood and affect. Her behavior is normal. Thought content normal.   motor strength is 4/5 in bilateral deltoid, biceps, triceps, and grip as well as hip  flexion, knee extension, ankle dorsiflexion. Sensation is intact to light touch in bilateral upper and lower extremities Mood and affect are appropriate General appears tired but otherwise no acute distress  Results for orders placed during the hospital encounter of 07/07/11 (from the past 24 hour(s))  GLUCOSE, CAPILLARY     Status: Abnormal   Collection Time   07/12/11 12:29 PM      Component Value Range   Glucose-Capillary 135 (*) 70 - 99 (mg/dL)   Comment 1 Documented in Chart     Comment 2 Notify RN    GLUCOSE, CAPILLARY     Status: Abnormal   Collection Time   07/12/11  4:37 PM      Component Value Range   Glucose-Capillary 144 (*) 70 - 99 (mg/dL)  GLUCOSE, CAPILLARY     Status: Abnormal   Collection Time   07/12/11  7:49 PM      Component Value Range   Glucose-Capillary 164 (*) 70 - 99 (mg/dL)   Comment 1 Documented in Chart     Comment 2 Notify RN    GLUCOSE, CAPILLARY     Status: Abnormal   Collection Time   07/12/11 11:30 PM      Component Value Range   Glucose-Capillary 160 (*) 70 - 99 (mg/dL)  GLUCOSE, CAPILLARY     Status: Abnormal   Collection Time   07/13/11  4:00 AM      Component Value Range   Glucose-Capillary 110 (*) 70 - 99 (mg/dL)  BASIC METABOLIC PANEL     Status: Abnormal   Collection Time   07/13/11  4:06 AM      Component Value Range   Sodium 138  135 - 145 (mEq/L)   Potassium 3.3 (*) 3.5 - 5.1 (mEq/L)   Chloride 104  96 - 112 (mEq/L)   CO2 26  19 - 32 (mEq/L)   Glucose, Bld 126 (*) 70 - 99 (mg/dL)   BUN 30 (*) 6 - 23 (mg/dL)   Creatinine, Ser 1.91  0.50 - 1.10 (mg/dL)   Calcium 7.9 (*) 8.4 - 10.5 (mg/dL)   GFR calc non Af Amer 52 (*) >90 (mL/min)   GFR calc Af Amer 60 (*) >90 (mL/min)  CBC     Status: Abnormal   Collection Time   07/13/11  4:06 AM      Component Value Range   WBC 13.3 (*) 4.0 - 10.5 (K/uL)   RBC 3.19 (*) 3.87 - 5.11 (MIL/uL)   Hemoglobin 9.4 (*) 12.0 - 15.0 (g/dL)   HCT 47.8 (*) 29.5 - 46.0 (%)   MCV 86.2  78.0 - 100.0 (fL)   MCH  29.5  26.0 - 34.0 (pg)   MCHC 34.2  30.0 - 36.0 (g/dL)   RDW 62.1  30.8 -  15.5 (%)   Platelets 181  150 - 400 (K/uL)  GLUCOSE, CAPILLARY     Status: Abnormal   Collection Time   07/13/11  7:24 AM      Component Value Range   Glucose-Capillary 117 (*) 70 - 99 (mg/dL)   Comment 1 Notify RN     Comment 2 Documented in Chart     Dg Chest Port 1v Same Day  07/13/2011  *RADIOLOGY REPORT*  Clinical Data: Postop open heart surgery, follow-up  PORTABLE CHEST - 1 VIEW SAME DAY  Comparison: Portable chest x-ray of 07/12/2011  Findings: Bilateral chest tubes are present and there may be a tiny left apical pneumothorax.  Bilateral pleural effusions present with basilar atelectasis.  Cardiomegaly is stable.  Right PICC line is noted with the tip in the lower SVC.  IMPRESSION: No change in poor aeration and basilar atelectasis with effusions. Bilateral chest tubes. Small left apical pneumothorax.  Original Report Authenticated By: Juline Patch, M.D.   Dg Chest Port 1v Same Day  07/12/2011  *RADIOLOGY REPORT*  Clinical Data: Follow-up surgery.  Shortness of breath.  PORTABLE CHEST - 1 VIEW SAME DAY  Comparison: Chest x-ray 07/11/2011.  Findings: Bilateral chest tubes appear unchanged in position with tip and sideport projecting over the thorax. Right internal jugular central venous catheter with tip terminating in the distal superior vena cava (unchanged).  Epicardial pacing wires noted.  There is increasing opacification in the right mid and lower hemithorax, likely reflecting increasing pleural effusion.  Bibasilar opacities also likely reflect areas of atelectasis (superimposed air space consolidation is difficult to exclude, but is not strongly favored).  No definite pneumothorax noted on today's examination. Heart size and mediastinal contours are unremarkable.  Status post median sternotomy.  IMPRESSION:  1.  Support apparatus, as above. 2.  Interval resolution of left pneumothorax. 3.  Increasing right-sided  pleural effusion (now moderate).  Small left effusion unchanged. 4.  Persistent bibasilar opacities favored to represent atelectasis.  Original Report Authenticated By: Florencia Reasons, M.D.    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 Does the need for close, 24 hr/day medical supervision in concert with the patient's rehab needs make it unreasonable for this patient to be served in a less intensive setting? Yes 2. Co-Morbidities requiring supervision/potential complications: Hypertension, coronary artery disease, Oster arthritis of the knee status post TK R. 3. Due to bladder management, bowel management, safety, skin/wound care, disease management, medication administration and pain management, does the patient require 24 hr/day rehab nursing? Yes 4. Does the patient require coordinated care of a physician, rehab nurse, PT (1-2 hrs/day, 5 days/week) and OT (Once to hrs/day, 5 days/week) to address physical and functional deficits in the context of the above medical diagnosis(es)? Yes Addressing deficits in the following areas: balance, endurance, locomotion, strength, transferring, bowel/bladder control, bathing, dressing, grooming and toileting 5. Can the patient actively participate in an intensive therapy program of at least 3 hrs of therapy per day at least 5 days per week? Yes 6. The potential for patient to make measurable gains while on inpatient rehab is good 7. Anticipated functional outcomes upon discharge from inpatients are supervision to min assist mobility PT, supervision to min assist ADLs OT, not applicable SLP 8. Estimated rehab length of stay to reach the above functional goals is: 2 weeks 9. Does the patient have adequate social supports to accommodate these discharge functional  goals? Plans are to hire help at home 10. Anticipated D/C  setting: Home 11. Anticipated post D/C treatments: HH therapy 12. Overall Rehab/Functional Prognosis: good  RECOMMENDATIONS: This patient's condition is appropriate for continued rehabilitative care in the following setting: CIR Patient has agreed to participate in recommended program. Yes Note that insurance prior authorization may be required for reimbursement for recommended care.  Comment:   Jacquelynn Cree 07/13/2011

## 2011-07-13 NOTE — Progress Notes (Signed)
Nutrition Follow-up  Pt states her appetite is "not too good" -- she did drink an Ensure Clinical Strength supplement this AM.  Diet Order:  Carbohydrate Modified Medium Calorie. PO intake 50%. Ensure Clinical Strength supplement ordered TID 2/6.  Meds: Scheduled Meds:   . acetaminophen  1,000 mg Oral Q6H   Or  . acetaminophen (TYLENOL) oral liquid 160 mg/5 mL  975 mg Per Tube Q6H  . aspirin EC  325 mg Oral Daily   Or  . aspirin  324 mg Per Tube Daily  . bisacodyl  10 mg Oral Daily   Or  . bisacodyl  10 mg Rectal Daily  . docusate sodium  200 mg Oral Daily  . feeding supplement  237 mL Oral TID WC  . furosemide  40 mg Intravenous BID  . insulin aspart  0-24 Units Subcutaneous Q4H  . metoprolol tartrate  12.5 mg Oral BID   Or  . metoprolol tartrate  12.5 mg Per Tube BID  . pantoprazole  40 mg Oral Q1200  . sodium chloride  10 mL Intravenous Q12H  . sodium chloride  10-40 mL Intracatheter Q12H  . DISCONTD: sodium chloride  3 mL Intravenous Q12H   Continuous Infusions:   . sodium chloride 20 mL/hr at 07/11/11 0700  . sodium chloride 20 mL/hr at 07/13/11 1100  . sodium chloride 250 mL (07/13/11 0430)  . insulin (NOVOLIN-R) infusion 2.8 Units/hr (07/08/11 1400)  . lactated ringers 10 mL/hr at 07/11/11 0700   PRN Meds:.metoprolol, ondansetron (ZOFRAN) IV, potassium chloride, sodium chloride, sodium chloride, traMADol, DISCONTD: sodium chloride, DISCONTD: traMADol  Labs:  CMP     Component Value Date/Time   NA 138 07/13/2011 0406   K 3.3* 07/13/2011 0406   CL 104 07/13/2011 0406   CO2 26 07/13/2011 0406   GLUCOSE 126* 07/13/2011 0406   BUN 30* 07/13/2011 0406   CREATININE 0.97 07/13/2011 0406   CALCIUM 7.9* 07/13/2011 0406   PROT 4.1* 07/10/2011 0416   ALBUMIN 2.0* 07/10/2011 0416   AST 140* 07/10/2011 0416   ALT 254* 07/10/2011 0416   ALKPHOS 43 07/10/2011 0416   BILITOT 0.5 07/10/2011 0416   GFRNONAA 52* 07/13/2011 0406   GFRAA 60* 07/13/2011 0406     Intake/Output Summary (Last 24 hours) at  07/13/11 1110 Last data filed at 07/13/11 0900  Gross per 24 hour  Intake   1072 ml  Output   2895 ml  Net  -1823 ml    CBG (last 3)   Basename 07/13/11 0724 07/13/11 0400 07/12/11 2330  GLUCAP 117* 110* 160*    Weight Status:  68.6 kg (2/7) -- down likely due to diuresis  Re-estimated needs:  1500-1700 kcals, 80-90 gm protein  Nutrition Dx:  Inadequate Oral Intake now r/t poor appetite, ongoing  Goal:  PO diet & supplemental intake to meet >90% of estimated nutrition needs, unmet Monitor: PO intake, weight, labs, I/O's  Intervention:    Continue Ensure Clinical Strength PO TID  RD to follow for nutrition care plan   Alger Memos Pager #:  [phone]

## 2011-07-13 NOTE — Progress Notes (Signed)
TCTS BRIEF SICU PROGRESS NOTE  6 Days Post-Op  S/P Procedure(s) (LRB): CORONARY ARTERY BYPASS GRAFTING (CABG) (N/A) VENTRICULAR SEPTAL DEFECT (VSD) REPAIR (N/A) EXCISION MASS (N/A)   Stable day Some serosanguinous drainage from chest tube holes  Plan: Continue current plan  Ravinder Lukehart H 07/13/2011 9:17 PM

## 2011-07-14 ENCOUNTER — Inpatient Hospital Stay (HOSPITAL_COMMUNITY): Payer: Medicare Other

## 2011-07-14 LAB — GLUCOSE, CAPILLARY
Glucose-Capillary: 127 mg/dL — ABNORMAL HIGH (ref 70–99)
Glucose-Capillary: 138 mg/dL — ABNORMAL HIGH (ref 70–99)
Glucose-Capillary: 140 mg/dL — ABNORMAL HIGH (ref 70–99)
Glucose-Capillary: 147 mg/dL — ABNORMAL HIGH (ref 70–99)
Glucose-Capillary: 151 mg/dL — ABNORMAL HIGH (ref 70–99)
Glucose-Capillary: 155 mg/dL — ABNORMAL HIGH (ref 70–99)

## 2011-07-14 LAB — CBC
HCT: 28.8 % — ABNORMAL LOW (ref 36.0–46.0)
Hemoglobin: 9.6 g/dL — ABNORMAL LOW (ref 12.0–15.0)
MCH: 29.7 pg (ref 26.0–34.0)
MCHC: 33.3 g/dL (ref 30.0–36.0)
MCV: 89.2 fL (ref 78.0–100.0)
Platelets: 266 10*3/uL (ref 150–400)
RBC: 3.23 MIL/uL — ABNORMAL LOW (ref 3.87–5.11)
RDW: 15.8 % — ABNORMAL HIGH (ref 11.5–15.5)
WBC: 17.3 10*3/uL — ABNORMAL HIGH (ref 4.0–10.5)

## 2011-07-14 LAB — BASIC METABOLIC PANEL
BUN: 25 mg/dL — ABNORMAL HIGH (ref 6–23)
CO2: 28 mEq/L (ref 19–32)
Calcium: 8.2 mg/dL — ABNORMAL LOW (ref 8.4–10.5)
Chloride: 101 mEq/L (ref 96–112)
Creatinine, Ser: 0.91 mg/dL (ref 0.50–1.10)
GFR calc Af Amer: 65 mL/min — ABNORMAL LOW (ref >90)
GFR calc non Af Amer: 56 mL/min — ABNORMAL LOW (ref >90)
Glucose, Bld: 161 mg/dL — ABNORMAL HIGH (ref 70–99)
Potassium: 3.6 mEq/L (ref 3.5–5.1)
Sodium: 137 mEq/L (ref 135–145)

## 2011-07-14 MED ORDER — POTASSIUM CHLORIDE 10 MEQ/50ML IV SOLN
INTRAVENOUS | Status: AC
  Administered 2011-07-14: 10 meq via INTRAVENOUS
  Filled 2011-07-14: qty 50

## 2011-07-14 MED ORDER — INSULIN ASPART 100 UNIT/ML ~~LOC~~ SOLN
0.0000 [IU] | Freq: Three times a day (TID) | SUBCUTANEOUS | Status: DC
  Administered 2011-07-15: 3 [IU] via SUBCUTANEOUS
  Administered 2011-07-15: 2 [IU] via SUBCUTANEOUS
  Administered 2011-07-16: 3 [IU] via SUBCUTANEOUS
  Administered 2011-07-16: 2 [IU] via SUBCUTANEOUS
  Administered 2011-07-16 – 2011-07-17 (×2): 3 [IU] via SUBCUTANEOUS

## 2011-07-14 MED ORDER — POTASSIUM CHLORIDE 10 MEQ/50ML IV SOLN
10.0000 meq | INTRAVENOUS | Status: AC | PRN
  Administered 2011-07-14 – 2011-07-17 (×3): 10 meq via INTRAVENOUS
  Filled 2011-07-14: qty 50
  Filled 2011-07-14: qty 100

## 2011-07-14 MED ORDER — INSULIN ASPART 100 UNIT/ML ~~LOC~~ SOLN: 0.0000 [IU] | Freq: Every day | SUBCUTANEOUS | Status: DC

## 2011-07-14 NOTE — Progress Notes (Signed)
Patient ID: [name], female   DOB: 1925/03/20, 78 y.o.   MRN: [id number] TCTS DAILY PROGRESS NOTE                   [address]            [place] [address]          [phone]      7 Days Post-Op Procedure(s) (LRB): CORONARY ARTERY BYPASS GRAFTING (CABG) (N/A) VENTRICULAR SEPTAL DEFECT (VSD) REPAIR (N/A) EXCISION MASS (N/A)  Total Length of Stay:  LOS: 7 days   Subjective: stil weak, ambulated in room only, walked about 30 ft with help  Objective: Vital signs in last 24 hours: Temp:  [97.8 F (36.6 C)-99.7 F (37.6 C)] 99.2 F (37.3 C) (02/08 1548) Pulse Rate:  [84-110] 89  (02/08 1600) Cardiac Rhythm:  [-] Normal sinus rhythm (02/08 1600) Resp:  [17-30] 24  (02/08 1600) BP: (86-134)/(43-60) 97/45 mmHg (02/08 1600) SpO2:  [93 %-100 %] 97 % (02/08 1600) Weight:  [147 lb 11.3 oz (67 kg)] 147 lb 11.3 oz (67 kg) (02/08 0700)  Filed Weights   07/12/11 0600 07/13/11 0500 07/14/11 0700  Weight: 153 lb 14.1 oz (69.8 kg) 151 lb 3.8 oz (68.6 kg) 147 lb 11.3 oz (67 kg)    Weight change: -3 lb 8.4 oz (-1.6 kg)       Intake/Output from previous day: 02/07 0701 - 02/08 0700 In: 1870 [P.O.:1440; I.V.:220; IV Piggyback:210] Out: 3230 [Urine:3160; Chest Tube:70]  Intake/Output this shift: Total I/O In: 612 [P.O.:537; I.V.:20; IV Piggyback:55] Out: 1435 [Urine:1435]  Current Meds: Scheduled Meds:   . aspirin EC  325 mg Oral Daily   Or  . aspirin  324 mg Per Tube Daily  . bisacodyl  10 mg Oral Daily   Or  . bisacodyl  10 mg Rectal Daily  . docusate sodium  200 mg Oral Daily  . feeding supplement  237 mL Oral TID WC  . furosemide  40 mg Intravenous BID  . insulin aspart  0-24 Units Subcutaneous Q4H  . metoprolol tartrate  12.5 mg Oral BID   Or  . metoprolol tartrate  12.5 mg Per Tube BID  . pantoprazole  40 mg Oral Q1200  . potassium chloride      . sodium chloride  10 mL Intravenous Q12H  . sodium chloride  10-40 mL Intracatheter Q12H    Continuous Infusions:   . sodium chloride 20 mL/hr at 07/11/11 0700  . sodium chloride 20 mL/hr at 07/13/11 1900  . sodium chloride 250 mL (07/13/11 0430)  . insulin (NOVOLIN-R) infusion 2.8 Units/hr (07/08/11 1400)  . lactated ringers 10 mL/hr at 07/11/11 0700   PRN Meds:.metoprolol, ondansetron (ZOFRAN) IV, potassium chloride, potassium chloride, sodium chloride, sodium chloride, traMADol  General appearance: alert, cooperative, fatigued and no distress Neurologic: intact Heart: systolic murmur: early systolic 2/6, soft at apex Lungs: clear to auscultation bilaterally Abdomen: soft, non-tender; bowel sounds normal; no masses,  no organomegaly Extremities: extremities normal, atraumatic, no cyanosis or edema and Homans sign is negative, no sign of DVT Wound: stable Some drainage from old rt chest tube site  Lab Results: CBC: Basename 07/14/11 0410 07/13/11 0406  WBC 17.3* 13.3*  HGB 9.6* 9.4*  HCT 28.8* 27.5*  PLT 266 181   BMET:  Basename 07/14/11 0410 07/13/11 0406  NA 137 138  K 3.6 3.3*  CL 101 104  CO2 28 26  GLUCOSE 161* 126*  BUN 25*  30*  CREATININE 0.91 0.97  CALCIUM 8.2* 7.9*    PT/INR: No results found for this basename: LABPROT,INR in the last 72 hours Radiology: Dg Chest Port 1v Same Day  07/14/2011  *RADIOLOGY REPORT*  Clinical Data: Cardiac surgery  PORTABLE CHEST - 1 VIEW SAME DAY  Comparison: Yesterday  Findings: Stable right PICC.  Chest tubes removed without pneumothorax.  Bilateral effusions and bilateral airspace disease not significantly changed.  Low volumes.  Stable heart size.  IMPRESSION: Chest tubes removed without pneumothorax.  Otherwise stable.  Original Report Authenticated By: Donavan Burnet, M.D.   Dg Chest Port 1v Same Day  07/13/2011  *RADIOLOGY REPORT*  Clinical Data: Postop open heart surgery, follow-up  PORTABLE CHEST - 1 VIEW SAME DAY  Comparison: Portable chest x-ray of 07/12/2011  Findings: Bilateral chest tubes are present and  there may be a tiny left apical pneumothorax.  Bilateral pleural effusions present with basilar atelectasis.  Cardiomegaly is stable.  Right PICC line is noted with the tip in the lower SVC.  IMPRESSION: No change in poor aeration and basilar atelectasis with effusions. Bilateral chest tubes. Small left apical pneumothorax.  Original Report Authenticated By: Juline Patch, M.D.     Assessment/Plan: S/P Procedure(s) (LRB): CORONARY ARTERY BYPASS GRAFTING (CABG) (N/A) VENTRICULAR SEPTAL DEFECT (VSD) REPAIR (N/A) EXCISION MASS (N/A) Very slow progress Have consulted in patient rehab to see     [name] MD  Beeper [phone] Office [phone] 07/14/2011 4:55 PM

## 2011-07-14 NOTE — Progress Notes (Signed)
PT Cancellation Note  Treatment cancelled today due to pt just back to bed with nsg. Will f/u as time allows. Humberto Seals HELEN 07/14/2011, 10:11 AM [phone]

## 2011-07-14 NOTE — Progress Notes (Addendum)
Physical Therapy Treatment Patient Details Name: [name] MRN: [id number] DOB: 1924-12-14 Today's Date: 07/14/2011  PT Assessment/Plan  PT - Assessment/Plan Comments on Treatment Session: Much improved today but still with decreased activity tolerance.  PT Plan: Frequency remains appropriate;Discharge plan needs to be updated PT Frequency: Min 3X/week Follow Up Recommendations: Inpatient Rehab Equipment Recommended: Defer to next venue PT Goals  Acute Rehab PT Goals PT Goal: Sit to Stand - Progress: Progressing toward goal PT Goal: Stand to Sit - Progress: Progressing toward goal PT Transfer Goal: Bed to Chair/Chair to Bed - Progress: Progressing toward goal PT Goal: Stand - Progress: Progressing toward goal PT Goal: Ambulate - Progress: Progressing toward goal PT Goal: Perform Home Exercise Program - Progress: Progressing toward goal (pt activity performing SLR in bed with son)  PT Treatment Precautions/Restrictions  Precautions Precautions: Sternal;Fall Restrictions Weight Bearing Restrictions: No Mobility (including Balance) Bed Mobility Bed Mobility: yes Pt rolled left with +2totalpt50% with facilitation for rotation (pt flexing right knee to assist) and then performed sidelying->sit with modA to elevate trunk while pt held cardiac pillow. Pt scooted EOB with modA faciliation with pad for reciprocal scooting; cues for weight shift.  Transfers Sit to Stand: 1: +2 Total assist;From chair/3-in-1 (50%) Sit to Stand Details (indicate cue type and reason): assist for anterior translation and follow through to stand as well as posture and hip extension; does well with verbal cues Stand to Sit: 3: Mod assist;To chair/3-in-1 Stand to Sit Details: cues for sequencing and safe hand placement; assist to control descent to chair Ambulation/Gait Ambulation/Gait Assistance: 1: +2 Total assist (70%) Ambulation/Gait Assistance Details (indicate cue type and reason): amb. approx 15 ft  today with RW; slow cadence, downward gaze; cues for upright posture and sequencing with RW Assistive device: Rolling walker Gait Pattern: Trunk flexed;Shuffle  Posture/Postural Control Posture/Postural Control: No significant limitations Exercise    End of Session PT - End of Session Equipment Utilized During Treatment: Gait belt Activity Tolerance: Patient tolerated treatment well;Patient limited by fatigue Patient left: in chair;with call bell in reach;with family/visitor present Nurse Communication: Mobility status for transfers;Mobility status for ambulation General Behavior During Session: [facility] for tasks performed Cognition: [facility] for tasks performed  [facility] HELEN 07/14/2011, 3:41 PM

## 2011-07-14 NOTE — Progress Notes (Signed)
CSW aware of CIR referral - spoke with CIR, they are awaiting OT eval to complete their assessment, but feel pt is likely a good candidate for CIR, per Genie. CSW spoke with pt and pt son who confirm they have 24 hour care provided by [facility] caregivers (this information passed along to Genie).  CSW will follow for SNF placement as backup to CIR, if needed. Holding on faxing pt out as pt with [facility] requiring preauthorization.  Baxter Flattery, MSW [phone]

## 2011-07-15 ENCOUNTER — Inpatient Hospital Stay (HOSPITAL_COMMUNITY): Payer: Medicare Other

## 2011-07-15 DIAGNOSIS — I2109 ST elevation (STEMI) myocardial infarction involving other coronary artery of anterior wall: Secondary | ICD-10-CM

## 2011-07-15 LAB — CBC
HCT: 28.8 % — ABNORMAL LOW (ref 36.0–46.0)
Hemoglobin: 9.4 g/dL — ABNORMAL LOW (ref 12.0–15.0)
MCH: 29.2 pg (ref 26.0–34.0)
MCHC: 32.6 g/dL (ref 30.0–36.0)
MCV: 89.4 fL (ref 78.0–100.0)
Platelets: 304 10*3/uL (ref 150–400)
RBC: 3.22 MIL/uL — ABNORMAL LOW (ref 3.87–5.11)
RDW: 16 % — ABNORMAL HIGH (ref 11.5–15.5)
WBC: 16.5 10*3/uL — ABNORMAL HIGH (ref 4.0–10.5)

## 2011-07-15 LAB — GLUCOSE, CAPILLARY
Glucose-Capillary: 109 mg/dL — ABNORMAL HIGH (ref 70–99)
Glucose-Capillary: 142 mg/dL — ABNORMAL HIGH (ref 70–99)
Glucose-Capillary: 147 mg/dL — ABNORMAL HIGH (ref 70–99)
Glucose-Capillary: 148 mg/dL — ABNORMAL HIGH (ref 70–99)
Glucose-Capillary: 166 mg/dL — ABNORMAL HIGH (ref 70–99)

## 2011-07-15 LAB — BASIC METABOLIC PANEL
BUN: 27 mg/dL — ABNORMAL HIGH (ref 6–23)
CO2: 31 mEq/L (ref 19–32)
Calcium: 8.4 mg/dL (ref 8.4–10.5)
Chloride: 97 mEq/L (ref 96–112)
Creatinine, Ser: 0.95 mg/dL (ref 0.50–1.10)
GFR calc Af Amer: 62 mL/min — ABNORMAL LOW (ref >90)
GFR calc non Af Amer: 53 mL/min — ABNORMAL LOW (ref >90)
Glucose, Bld: 129 mg/dL — ABNORMAL HIGH (ref 70–99)
Potassium: 3.9 mEq/L (ref 3.5–5.1)
Sodium: 135 mEq/L (ref 135–145)

## 2011-07-15 MED ORDER — FUROSEMIDE 40 MG PO TABS
40.0000 mg | ORAL_TABLET | Freq: Every day | ORAL | Status: DC
  Administered 2011-07-16 – 2011-07-18 (×3): 40 mg via ORAL
  Filled 2011-07-15 (×4): qty 1

## 2011-07-15 MED ORDER — FUROSEMIDE 40 MG PO TABS: 40.0000 mg | ORAL_TABLET | Freq: Every day | ORAL | Status: DC

## 2011-07-15 MED ORDER — DIPHENHYDRAMINE HCL 25 MG PO CAPS
25.0000 mg | ORAL_CAPSULE | Freq: Every evening | ORAL | Status: DC | PRN
  Administered 2011-07-15 – 2011-07-16 (×2): 25 mg via ORAL
  Filled 2011-07-15 (×2): qty 1

## 2011-07-15 NOTE — Progress Notes (Signed)
8 Days Post-Op Procedure(s) (LRB): CORONARY ARTERY BYPASS GRAFTING (CABG) (N/A) VENTRICULAR SEPTAL DEFECT (VSD) REPAIR (N/A) EXCISION MASS (N/A) Subjective:                  [address]            [place] [address]          [phone]       Objective:NSR, off O2 very weak Vital signs in last 24 hours: Temp:  [97.6 F (36.4 C)-99.2 F (37.3 C)] 98.4 F (36.9 C) (02/09 1200) Pulse Rate:  [38-95] 61  (02/09 1300) Cardiac Rhythm:  [-] Normal sinus rhythm (02/09 1200) Resp:  [18-24] 21  (02/09 1300) BP: (93-121)/(40-77) 116/47 mmHg (02/09 1300) SpO2:  [82 %-97 %] 82 % (02/09 1300) Weight:  [145 lb 8.1 oz (66 kg)-146 lb 13.2 oz (66.6 kg)] 145 lb 8.1 oz (66 kg) (02/09 0600)  Hemodynamic parameters for last 24 hours:    Intake/Output from previous day: 02/08 0701 - 02/09 0700 In: 1076 [P.O.:777; I.V.:240; IV Piggyback:59] Out: 2165 [Urine:2165] Intake/Output this shift: Total I/O In: 490 [P.O.:370; I.V.:120] Out: 850 [Urine:850]  Lungs clear,incision clean neuro intact  Lab Results:  Basename 07/15/11 0407 07/14/11 0410  WBC 16.5* 17.3*  HGB 9.4* 9.6*  HCT 28.8* 28.8*  PLT 304 266   BMET:  Basename 07/15/11 0407 07/14/11 0410  NA 135 137  K 3.9 3.6  CL 97 101  CO2 31 28  GLUCOSE 129* 161*  BUN 27* 25*  CREATININE 0.95 0.91  CALCIUM 8.4 8.2*    PT/INR: No results found for this basename: LABPROT,INR in the last 72 hours ABG    Component Value Date/Time   PHART 7.344* 07/08/2011 0356   HCO3 19.7* 07/08/2011 0356   TCO2 18 07/08/2011 1818   ACIDBASEDEF 5.0* 07/08/2011 0356   O2SAT 99.0 07/08/2011 0356   CBG (last 3)   Basename 07/15/11 1148 07/15/11 0751 07/14/11 2358  GLUCAP 166* 109* 147*    Assessment/Plan: S/P Procedure(s) (LRB): CORONARY ARTERY BYPASS GRAFTING (CABG) (N/A) VENTRICULAR SEPTAL DEFECT (VSD) REPAIR (N/A) EXCISION MASS (N/A) Cont current care   LOS: 8 days    VAN TRIGT III,PETER 07/15/2011

## 2011-07-15 NOTE — Evaluation (Signed)
Occupational Therapy Evaluation Patient Details Name: [name] MRN: [id number] DOB: 02-01-1925 Today's Date: 07/15/2011  Problem List:  Patient Active Problem List  Diagnoses  . Acute anterior wall MI  . HTN (hypertension)  . Osteoarthritis of knee  . CAD (coronary artery disease)  . VSD (ventricular septal defect)    Past Medical History:  Past Medical History  Diagnosis Date  . Acute MI, anterior wall     a. 07/07/2011  . HTN (hypertension)   . Osteoarthritis of knee     Bilateral.  a. s/p LTKA ~2007;  b. s/p RTKA 10/2010  . CAD (coronary artery disease)     a. 07/07/2011 - Occluded LAD  . VSD (ventricular septal defect)     a.  07/07/2011 - Noted @ time of MI   Past Surgical History:  Past Surgical History  Procedure Date  . Joint replacement   . Total knee arthroplasty     a.  Left ~ 2007, Right 10/2010  . Cataract extraction, bilateral   . Coronary artery bypass graft 07/07/2011    Procedure: CORONARY ARTERY BYPASS GRAFTING (CABG);  Surgeon: Kathlee Nations Suann Larry, MD;  Location: [facility] OR;  Service: Open Heart Surgery;  Laterality: N/A;  times two using greater saphenous vein harvested via endovascular vein harvest  . Vsd repair 07/07/2011    Procedure: VENTRICULAR SEPTAL DEFECT (VSD) REPAIR;  Surgeon: Mikey Bussing, MD;  Location: [facility] OR;  Service: Open Heart Surgery;  Laterality: N/A;  with bovine pericardium 4cm x 4cm  . Mass excision 07/07/2011    Procedure: EXCISION MASS;  Surgeon: Kathlee Nations Suann Larry, MD;  Location: [facility] OR;  Service: Open Heart Surgery;  Laterality: N/A;  Excision of mediastinal mass    OT Assessment/Plan/Recommendation OT Assessment Clinical Impression Statement: Pt is recovering from CABG x 2.  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  Pt is progressing steadily in mobility and could benefit from intense therapy prior to going home.  Will follow acutely. OT Recommendation/Assessment: Patient will need skilled OT in the  acute care venue OT Problem List: Decreased strength;Decreased activity tolerance;Impaired balance (sitting and/or standing);Pain;Decreased knowledge of use of DME or AE;Decreased knowledge of precautions;Impaired UE functional use OT Therapy Diagnosis : Generalized weakness;Acute pain OT Plan OT Frequency: Min 2X/week OT Treatment/Interventions: Self-care/ADL training;Balance training;Patient/family education;DME and/or AE instruction;Energy conservation OT Recommendation Follow Up Recommendations: Inpatient Rehab Equipment Recommended: Defer to next venue Individuals Consulted Consulted and Agree with Results and Recommendations: Patient;Family member/caregiver Family Member Consulted: son OT Goals Acute Rehab OT Goals OT Goal Formulation: With patient Time For Goal Achievement: 2 weeks ADL Goals Pt Will Perform Grooming: with min assist;Standing at sink (2 activities) ADL Goal: Grooming - Progress: Goal set today Pt Will Perform Upper Body Bathing: with supervision;with adaptive equipment;Sitting, edge of bed ADL Goal: Upper Body Bathing - Progress: Goal set today Pt Will Perform Lower Body Bathing: with min assist;Sitting, edge of bed;Sit to stand from bed;with adaptive equipment ADL Goal: Lower Body Bathing - Progress: Goal set today Pt Will Perform Upper Body Dressing: with supervision;Sitting, bed ADL Goal: Upper Body Dressing - Progress: Goal set today Pt Will Perform Lower Body Dressing: with min assist;with adaptive equipment;Sitting, bed;Sit to stand from bed ADL Goal: Lower Body Dressing - Progress: Goal set today Pt Will Transfer to Toilet: with min assist;3-in-1;Ambulation (over toilet) ADL Goal: Toilet Transfer - Progress: Goal set today Pt Will Perform Toileting - Clothing Manipulation: with supervision;Standing ADL  Goal: Toileting - Clothing Manipulation - Progress: Goal set today Pt Will Perform Toileting - Hygiene: with supervision;Sitting on 3-in-1 or toilet ADL  Goal: Toileting - Hygiene - Progress: Goal set today Miscellaneous OT Goals Miscellaneous OT Goal #1: Pt will adhere to sternal precautions during ADL. OT Goal: Miscellaneous Goal #1 - Progress: Goal set today Miscellaneous OT Goal #2: Pt will state at least 3 energy conservation techniques in ADL. OT Goal: Miscellaneous Goal #2 - Progress: Goal set today  OT Evaluation Precautions/Restrictions  Precautions Precautions: Sternal;Fall Restrictions Weight Bearing Restrictions: No Prior Functioning Home Living Lives With: Spouse;Other (Comment) (Husband has dementia ) Receives Help From: Family;Other (Comment) (has assist for personal care attendant for husband) Type of Home: House Home Layout: One level Home Adaptive Equipment: Walker - rolling Prior Function Level of Independence: Independent with basic ADLs;Independent with homemaking with ambulation;Independent with homemaking with wheelchair;Independent with gait;Independent with transfers Driving: Yes Vocation: Retired ADL ADL Eating/Feeding: Performed;Independent Where Assessed - Eating/Feeding: Chair Grooming: Moderate assistance;Brushing hair;Wash/dry hands;Performed Grooming Details (indicate cue type and reason): fatigues quickly with UE use Where Assessed - Grooming: Sitting, chair Upper Body Bathing: Simulated;Moderate assistance Where Assessed - Upper Body Bathing: Sitting, chair Lower Body Bathing: Maximal assistance;Simulated Lower Body Bathing Details (indicate cue type and reason): can cross foot over opposite knee, but not able to access foot Where Assessed - Lower Body Bathing: Sitting, chair;Sit to stand from chair Upper Body Dressing: Simulated;Moderate assistance Where Assessed - Upper Body Dressing: Sitting, chair Lower Body Dressing: Performed;Maximal assistance Lower Body Dressing Details (indicate cue type and reason): able to cross foot over opposite knee, but not access foot Where Assessed - Lower Body  Dressing: Sitting, chair;Sit to stand from bed Toilet Transfer: Performed;+2 Total assistance;Comment for patient % (80) Toilet Transfer Method: Stand pivot Acupuncturist: Set designer - Hygiene: Performed;+1 Total assistance Where Assessed - Toileting Hygiene: Standing ADL Comments: Pt limited by low endurance, decreased balance. Vision/Perception  Vision - History Patient Visual Report: No change from baseline Cognition Cognition Arousal/Alertness: Awake/alert Overall Cognitive Status: Appears within functional limits for tasks assessed Orientation Level: Oriented to person;Oriented to place;Oriented to situation Sensation/Coordination Sensation Light Touch: Appears Intact Hot/Cold: Appears Intact Proprioception: Appears Intact Coordination Gross Motor Movements are Fluid and Coordinated: Yes Fine Motor Movements are Fluid and Coordinated: Yes (arthritis in B hands) Extremity Assessment RUE Assessment RUE Assessment: Within Functional Limits (not formally assessed due to precautions) LUE Assessment LUE Assessment: Within Functional Limits (grossly, not formally assessed due to precautions) Mobility  Bed Mobility Bed Mobility: No Transfers Transfers: Yes Sit to Stand: 1: +2 Total assist;Patient percentage (comment) (80) Stand to Sit: 4: Min assist;With armrests;To chair/3-in-1 End of Session OT - End of Session Activity Tolerance: Patient limited by fatigue Patient left: in chair;with call bell in reach;with family/visitor present General Behavior During Session: [facility] for tasks performed Cognition: [facility] for tasks performed   Evern Bio 07/15/2011, 9:40 AM  [phone]

## 2011-07-15 NOTE — Progress Notes (Signed)
Patient ID: [name], female   DOB: 06-19-1924, 78 y.o.   MRN: [id number] Patient ID: NYRAH DEMOS, female   DOB: 02/26/1925, 78 y.o.   MRN: [id number] @ Subjective:  Denies SSCP, palpitations or Dyspnea Feels better with chest tubes out Daughter at bedside   Objective:  Filed Vitals:   07/15/11 0600 07/15/11 0700 07/15/11 0755 07/15/11 1200  BP: 118/77 93/55    Pulse: 89 86    Temp:   97.6 F (36.4 C) 98.4 F (36.9 C)  TempSrc:   Oral Oral  Resp: 19 18    Height:      Weight: 66 kg (145 lb 8.1 oz)     SpO2: 92% 95%      Intake/Output from previous day:  Intake/Output Summary (Last 24 hours) at 07/15/11 1232 Last data filed at 07/15/11 0700  Gross per 24 hour  Intake    544 ml  Output   1080 ml  Net   -536 ml    Physical Exam: Chronically ill elderly female Less  tachypnic Rales at base SEM thoughout precordium No edema Neuro nonfocal  Lab Results: Basic Metabolic Panel:  Basename 07/15/11 0407 07/14/11 0410  NA 135 137  K 3.9 3.6  CL 97 101  CO2 31 28  GLUCOSE 129* 161*  BUN 27* 25*  CREATININE 0.95 0.91  CALCIUM 8.4 8.2*  MG -- --  PHOS -- --   Liver Function Tests: No results found for this basename: AST:2,ALT:2,ALKPHOS:2,BILITOT:2,PROT:2,ALBUMIN:2 in the last 72 hours No results found for this basename: LIPASE:2,AMYLASE:2 in the last 72 hours CBC:  Basename 07/15/11 0407 07/14/11 0410  WBC 16.5* 17.3*  NEUTROABS -- --  HGB 9.4* 9.6*  HCT 28.8* 28.8*  MCV 89.4 89.2  PLT 304 266    Imaging: Dg Chest Port 1v Same Day  07/15/2011  *RADIOLOGY REPORT*  Clinical Data: Status post CABG.  PORTABLE CHEST - 1 VIEW SAME DAY  Comparison: 07/14/2011  Findings: Stable positioning of PICC line.  Aeration of both lower lobes improved since the prior study with residual atelectasis and bilateral pleural fluid remaining.  There is no evidence of overt pulmonary edema.  Heart size and mediastinal contours are stable. No pneumothorax.  IMPRESSION: Improved  aeration of both lower lobes.  Original Report Authenticated By: Reola Calkins, M.D.   Dg Chest Port 1v Same Day  07/14/2011  *RADIOLOGY REPORT*  Clinical Data: Cardiac surgery  PORTABLE CHEST - 1 VIEW SAME DAY  Comparison: Yesterday  Findings: Stable right PICC.  Chest tubes removed without pneumothorax.  Bilateral effusions and bilateral airspace disease not significantly changed.  Low volumes.  Stable heart size.  IMPRESSION: Chest tubes removed without pneumothorax.  Otherwise stable.  Original Report Authenticated By: Donavan Burnet, M.D.    Cardiac Studies:  ECG:  SR anterior MI   Telemetry:  NSR no afib  Echo: Preop EF 35% apical VSD  Medications:      . aspirin EC  325 mg Oral Daily   Or  . aspirin  324 mg Per Tube Daily  . bisacodyl  10 mg Oral Daily   Or  . bisacodyl  10 mg Rectal Daily  . docusate sodium  200 mg Oral Daily  . feeding supplement  237 mL Oral TID WC  . furosemide  40 mg Intravenous BID  . insulin aspart  0-15 Units Subcutaneous TID WC  . insulin aspart  0-5 Units Subcutaneous QHS  . metoprolol tartrate  12.5 mg Oral  BID   Or  . metoprolol tartrate  12.5 mg Per Tube BID  . pantoprazole  40 mg Oral Q1200  . sodium chloride  10 mL Intravenous Q12H  . sodium chloride  10-40 mL Intracatheter Q12H  . DISCONTD: insulin aspart  0-24 Units Subcutaneous Q4H        . sodium chloride 20 mL/hr at 07/11/11 0700  . sodium chloride 20 mL/hr at 07/14/11 2000  . sodium chloride 250 mL (07/13/11 0430)  . lactated ringers 10 mL/hr at 07/11/11 0700  . DISCONTD: insulin (NOVOLIN-R) infusion 2.8 Units/hr (07/08/11 1400)    Assessment/Plan:  CABG/VSD:  Echo Monday  to assess murmur and see if residual VSD.   Thymoma:  ? 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  F/U radiation oncology if  She is D/C and does ok Anemia:  Improving no evidence of ongoing blood loss in mediatinum or lungs  [name] 07/15/2011, 12:32 PM

## 2011-07-15 NOTE — Progress Notes (Signed)
Patient examined and record reviewed.Hemodynamics stable,labs satisfactory.Patient had stable day.Continue current care. VAN TRIGT III,PETER 07/15/2011    

## 2011-07-16 LAB — GLUCOSE, CAPILLARY
Glucose-Capillary: 168 mg/dL — ABNORMAL HIGH (ref 70–99)
Glucose-Capillary: 130 mg/dL — ABNORMAL HIGH (ref 70–99)
Glucose-Capillary: 184 mg/dL — ABNORMAL HIGH (ref 70–99)
Glucose-Capillary: 93 mg/dL (ref 70–99)

## 2011-07-16 NOTE — Progress Notes (Signed)
9 days postop repair of post MI VSD, CABG, resection of malignant thymoma  Maintaining sinus rhythm gradually improving strength and ambulation Incisions clean mild peripheral edema starting to take by mouth was better  Labs show mild leukocytosis 16,000, patient remains afebrile hematocrit 28 BUN 27 creatinine 0.9. Lasix dose has been decreased. 2-D echo ordered in a.m. Continue current care

## 2011-07-17 ENCOUNTER — Inpatient Hospital Stay (HOSPITAL_COMMUNITY): Payer: Medicare Other

## 2011-07-17 DIAGNOSIS — I517 Cardiomegaly: Secondary | ICD-10-CM

## 2011-07-17 LAB — BASIC METABOLIC PANEL
BUN: 20 mg/dL (ref 6–23)
CO2: 32 mEq/L (ref 19–32)
Calcium: 8.4 mg/dL (ref 8.4–10.5)
Chloride: 96 mEq/L (ref 96–112)
Creatinine, Ser: 1.03 mg/dL (ref 0.50–1.10)
GFR calc Af Amer: 56 mL/min — ABNORMAL LOW (ref >90)
GFR calc non Af Amer: 49 mL/min — ABNORMAL LOW (ref >90)
Glucose, Bld: 112 mg/dL — ABNORMAL HIGH (ref 70–99)
Potassium: 3.7 mEq/L (ref 3.5–5.1)
Sodium: 132 mEq/L — ABNORMAL LOW (ref 135–145)

## 2011-07-17 LAB — GLUCOSE, CAPILLARY
Glucose-Capillary: 112 mg/dL — ABNORMAL HIGH (ref 70–99)
Glucose-Capillary: 162 mg/dL — ABNORMAL HIGH (ref 70–99)
Glucose-Capillary: 168 mg/dL — ABNORMAL HIGH (ref 70–99)
Glucose-Capillary: 169 mg/dL — ABNORMAL HIGH (ref 70–99)

## 2011-07-17 LAB — CBC
HCT: 28.3 % — ABNORMAL LOW (ref 36.0–46.0)
Hemoglobin: 9.1 g/dL — ABNORMAL LOW (ref 12.0–15.0)
MCH: 29 pg (ref 26.0–34.0)
MCHC: 32.2 g/dL (ref 30.0–36.0)
MCV: 90.1 fL (ref 78.0–100.0)
Platelets: 382 10*3/uL (ref 150–400)
RBC: 3.14 MIL/uL — ABNORMAL LOW (ref 3.87–5.11)
RDW: 15.8 % — ABNORMAL HIGH (ref 11.5–15.5)
WBC: 16.9 10*3/uL — ABNORMAL HIGH (ref 4.0–10.5)

## 2011-07-17 MED ORDER — ASPIRIN EC 325 MG PO TBEC
325.0000 mg | DELAYED_RELEASE_TABLET | Freq: Every day | ORAL | Status: DC
  Administered 2011-07-18: 325 mg via ORAL
  Filled 2011-07-17 (×2): qty 1

## 2011-07-17 MED ORDER — DOCUSATE SODIUM 100 MG PO CAPS: 200.0000 mg | ORAL_CAPSULE | Freq: Every day | ORAL | Status: DC

## 2011-07-17 MED ORDER — BISACODYL 10 MG RE SUPP: 10.0000 mg | Freq: Every day | RECTAL | Status: DC | PRN

## 2011-07-17 MED ORDER — SODIUM CHLORIDE 0.9 % IJ SOLN
3.0000 mL | Freq: Two times a day (BID) | INTRAMUSCULAR | Status: DC
  Administered 2011-07-17 – 2011-07-18 (×2): 3 mL via INTRAVENOUS

## 2011-07-17 MED ORDER — POTASSIUM CHLORIDE CRYS ER 20 MEQ PO TBCR
20.0000 meq | EXTENDED_RELEASE_TABLET | Freq: Every day | ORAL | Status: DC
  Administered 2011-07-17 – 2011-07-18 (×2): 20 meq via ORAL
  Filled 2011-07-17 (×3): qty 1

## 2011-07-17 MED ORDER — RAMIPRIL 2.5 MG PO CAPS
2.5000 mg | ORAL_CAPSULE | Freq: Every day | ORAL | Status: DC
  Filled 2011-07-17 (×2): qty 1

## 2011-07-17 MED ORDER — ROSUVASTATIN CALCIUM 5 MG PO TABS
5.0000 mg | ORAL_TABLET | Freq: Every day | ORAL | Status: DC
  Administered 2011-07-17: 5 mg via ORAL
  Filled 2011-07-17 (×2): qty 1

## 2011-07-17 MED ORDER — SODIUM CHLORIDE 0.9 % IV SOLN: 250.0000 mL | INTRAVENOUS | Status: DC | PRN

## 2011-07-17 MED ORDER — ACETAMINOPHEN 325 MG PO TABS
650.0000 mg | ORAL_TABLET | Freq: Four times a day (QID) | ORAL | Status: DC | PRN
  Administered 2011-07-18: 650 mg via ORAL
  Filled 2011-07-17: qty 2

## 2011-07-17 MED ORDER — MOVING RIGHT ALONG BOOK
Freq: Once | Status: AC
  Filled 2011-07-17: qty 1

## 2011-07-17 MED ORDER — PANTOPRAZOLE SODIUM 40 MG PO TBEC
40.0000 mg | DELAYED_RELEASE_TABLET | Freq: Every day | ORAL | Status: DC
  Administered 2011-07-18: 40 mg via ORAL
  Filled 2011-07-17: qty 1

## 2011-07-17 MED ORDER — SODIUM CHLORIDE 0.9 % IJ SOLN: 3.0000 mL | INTRAMUSCULAR | Status: DC | PRN

## 2011-07-17 MED ORDER — ONDANSETRON HCL 4 MG PO TABS: 4.0000 mg | ORAL_TABLET | Freq: Four times a day (QID) | ORAL | Status: DC | PRN

## 2011-07-17 MED ORDER — POTASSIUM CHLORIDE 10 MEQ/50ML IV SOLN
INTRAVENOUS | Status: AC
  Administered 2011-07-17: 10 meq
  Filled 2011-07-17: qty 50

## 2011-07-17 MED ORDER — DIPHENHYDRAMINE HCL 25 MG PO CAPS
25.0000 mg | ORAL_CAPSULE | Freq: Every evening | ORAL | Status: DC | PRN
  Administered 2011-07-17: 25 mg via ORAL
  Filled 2011-07-17: qty 1

## 2011-07-17 MED ORDER — BISACODYL 5 MG PO TBEC: 10.0000 mg | DELAYED_RELEASE_TABLET | Freq: Every day | ORAL | Status: DC | PRN

## 2011-07-17 MED ORDER — CARVEDILOL 6.25 MG PO TABS
6.2500 mg | ORAL_TABLET | Freq: Two times a day (BID) | ORAL | Status: DC
  Administered 2011-07-17 – 2011-07-18 (×2): 6.25 mg via ORAL
  Filled 2011-07-17 (×4): qty 1

## 2011-07-17 MED ORDER — ONDANSETRON HCL 4 MG/2ML IJ SOLN: 4.0000 mg | Freq: Four times a day (QID) | INTRAMUSCULAR | Status: DC | PRN

## 2011-07-17 NOTE — Plan of Care (Signed)
Problem: Phase I Progression Outcomes Goal: Verbalized level of anxiety/depression Outcome: Completed/Met Date Met:  07/17/11 denies

## 2011-07-17 NOTE — Progress Notes (Signed)
  Echocardiogram 2D Echocardiogram has been performed.  [name] Ted Goodner 07/17/2011, 8:35 AM

## 2011-07-17 NOTE — Progress Notes (Signed)
[facility] BRIEF SICU PROGRESS NOTE  10 Days Post-Op  S/P Procedure(s) (LRB): CORONARY ARTERY BYPASS GRAFTING (CABG) (N/A) VENTRICULAR SEPTAL DEFECT (VSD) REPAIR (N/A) EXCISION MASS (N/A)   Stable day Waiting for bed to transfer Wants benadryl for sleep aid  Plan: Continue current plan  [place] Salamone H 07/17/2011 9:05 PM

## 2011-07-17 NOTE — Progress Notes (Signed)
Patient ID: [name], female   DOB: August 08, 1924, 78 y.o.   MRN: [id number] @ Subjective:  Denies SSCP, palpitations or Dyspnea Feels better with chest tubes out    Objective:  Filed Vitals:   07/17/11 1600 07/17/11 1642 07/17/11 1700 07/17/11 1800  BP: 101/55  112/45 119/47  Pulse: 85  81 93  Temp:  98.7 F (37.1 C)    TempSrc:  Oral    Resp: 21  24 21   Height:      Weight:      SpO2: 98%  96% 89%    Intake/Output from previous day:  Intake/Output Summary (Last 24 hours) at 07/17/11 1834 Last data filed at 07/17/11 1800  Gross per 24 hour  Intake    980 ml  Output   1200 ml  Net   -220 ml    Physical Exam: Chronically ill elderly female Less  tachypnic Rales at base SEM thoughout precordium No edema Neuro nonfocal  Lab Results: Basic Metabolic Panel:  Basename 07/17/11 0330 07/15/11 0407  NA 132* 135  K 3.7 3.9  CL 96 97  CO2 32 31  GLUCOSE 112* 129*  BUN 20 27*  CREATININE 1.03 0.95  CALCIUM 8.4 8.4  MG -- --  PHOS -- --   CBC:  Basename 07/17/11 0330 07/15/11 0407  WBC 16.9* 16.5*  NEUTROABS -- --  HGB 9.1* 9.4*  HCT 28.3* 28.8*  MCV 90.1 89.4  PLT 382 304    Imaging: Dg Chest 2 View  07/17/2011  *RADIOLOGY REPORT*  Clinical Data: Post CABG.  Weakness, shortness of breath and cough.  CHEST - 2 VIEW  Comparison: 07/15/2011.  Findings: Trachea is midline.  Heart size stable.  Sternotomy wires are unchanged in position.  Right PICC tip projects over the SVC/RA junction.  There is central pulmonary vascular congestion with small bilateral pleural effusions and bibasilar air space disease.  Findings appear relatively stable from 07/15/2011.  IMPRESSION: Central pulmonary vascular congestion with small bilateral pleural effusions and bibasilar air space disease, stable.  Original Report Authenticated By: Reyes Ivan, M.D.    Cardiac Studies:  ECG:  SR anterior MI   Telemetry:  NSR no afib  Echo: Preop EF 35% apical VSD Echo today EF 35%  no mural apical clot, no effusion small residual VSD  Medications:      . aspirin EC  325 mg Oral Daily  . carvedilol  6.25 mg Oral BID WC  . docusate sodium  200 mg Oral Daily  . feeding supplement  237 mL Oral TID WC  . furosemide  40 mg Oral Daily  . moving right along book   Does not apply Once  . pantoprazole  40 mg Oral Q1200  . potassium chloride      . potassium chloride  20 mEq Oral Daily  . ramipril  2.5 mg Oral Daily  . rosuvastatin  5 mg Oral q1800  . sodium chloride  3 mL Intravenous Q12H  . DISCONTD: aspirin  324 mg Per Tube Daily  . DISCONTD: aspirin EC  325 mg Oral Daily  . DISCONTD: bisacodyl  10 mg Oral Daily  . DISCONTD: bisacodyl  10 mg Rectal Daily  . DISCONTD: docusate sodium  200 mg Oral Daily  . DISCONTD: insulin aspart  0-15 Units Subcutaneous TID WC  . DISCONTD: insulin aspart  0-5 Units Subcutaneous QHS  . DISCONTD: metoprolol tartrate  12.5 mg Per Tube BID  . DISCONTD: metoprolol tartrate  12.5 mg Oral  BID  . DISCONTD: pantoprazole  40 mg Oral Q1200  . DISCONTD: sodium chloride  10 mL Intravenous Q12H  . DISCONTD: sodium chloride  10-40 mL Intracatheter Q12H        . DISCONTD: sodium chloride 20 mL/hr at 07/15/11 1500  . DISCONTD: sodium chloride 20 mL/hr at 07/14/11 2000  . DISCONTD: sodium chloride 250 mL (07/13/11 0430)  . DISCONTD: lactated ringers 10 mL/hr at 07/11/11 0700    Assessment/Plan:  CABG/VSD: doing amazingly well due to excellent surgical care.  Small residual VSD by echo Thymoma:  ? 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  F/U radiation oncology if  She is D/C and does ok Anemia:  Improving no evidence of ongoing blood loss in mediatinum or lungs  [name] 07/17/2011, 6:34 PM

## 2011-07-17 NOTE — Progress Notes (Signed)
CSW facilitated communication with pt and CIR. Pt has agreed to CIR. CSW will f-u with pt daughter.  Baxter Flattery, MSW [phone]

## 2011-07-17 NOTE — Progress Notes (Addendum)
Patient ID: [name], female   DOB: 1925-01-27, 78 y.o.   MRN: [id number] TCTS DAILY PROGRESS NOTE                   [address]            [place] [address]          [phone]      10 Days Post-Op Procedure(s) (LRB): CORONARY ARTERY BYPASS GRAFTING (CABG) (N/A) VENTRICULAR SEPTAL DEFECT (VSD) REPAIR (N/A) EXCISION MASS (N/A)  Total Length of Stay:  LOS: 10 days   Subjective: Feeling better, walked in hall  Objective: Vital signs in last 24 hours: Temp:  [97.4 F (36.3 C)-98.2 F (36.8 C)] 97.4 F (36.3 C) (02/11 1206) Pulse Rate:  [79-104] 85  (02/11 1200) Cardiac Rhythm:  [-] Normal sinus rhythm (02/11 1200) Resp:  [9-31] 26  (02/11 1200) BP: (66-131)/(33-107) 95/74 mmHg (02/11 1200) SpO2:  [94 %-100 %] 99 % (02/11 1200) Weight:  [143 lb 8.3 oz (65.1 kg)] 143 lb 8.3 oz (65.1 kg) (02/11 0500)  Filed Weights   07/15/11 0600 07/16/11 0500 07/17/11 0500  Weight: 145 lb 8.1 oz (66 kg) 144 lb 6.4 oz (65.5 kg) 143 lb 8.3 oz (65.1 kg)    Weight change: -14.1 oz (-0.4 kg)       Intake/Output from previous day: 02/10 0701 - 02/11 0700 In: 1140 [P.O.:1040; IV Piggyback:100] Out: 1402 [Urine:1400; Stool:2]  Intake/Output this shift: Total I/O In: 340 [P.O.:240; IV Piggyback:100] Out: 350 [Urine:350]  Current Meds: Scheduled Meds:   . aspirin EC  325 mg Oral Daily   Or  . aspirin  324 mg Per Tube Daily  . bisacodyl  10 mg Oral Daily   Or  . bisacodyl  10 mg Rectal Daily  . docusate sodium  200 mg Oral Daily  . feeding supplement  237 mL Oral TID WC  . furosemide  40 mg Oral Daily  . insulin aspart  0-15 Units Subcutaneous TID WC  . insulin aspart  0-5 Units Subcutaneous QHS  . metoprolol tartrate  12.5 mg Oral BID   Or  . metoprolol tartrate  12.5 mg Per Tube BID  . pantoprazole  40 mg Oral Q1200  . potassium chloride      . sodium chloride  10 mL Intravenous Q12H  . sodium chloride  10-40 mL Intracatheter Q12H   Continuous  Infusions:   . sodium chloride 20 mL/hr at 07/15/11 1500  . sodium chloride 20 mL/hr at 07/14/11 2000  . sodium chloride 250 mL (07/13/11 0430)  . lactated ringers 10 mL/hr at 07/11/11 0700   PRN Meds:.diphenhydrAMINE, metoprolol, ondansetron (ZOFRAN) IV, potassium chloride, sodium chloride, sodium chloride, traMADol  General appearance: alert, cooperative and no distress Neurologic: intact Heart: regular rate and rhythm, S1, S2 normal,soft systolic  Murmur at apex, click, rub or gallop Lungs: clear to auscultation bilaterally Abdomen: soft, non-tender; bowel sounds normal; no masses,  no organomegaly Extremities: extremities normal, atraumatic, no cyanosis or edema Wound: sternum stable  Lab Results: CBC: Basename 07/17/11 0330 07/15/11 0407  WBC 16.9* 16.5*  HGB 9.1* 9.4*  HCT 28.3* 28.8*  PLT 382 304   BMET:  Basename 07/17/11 0330 07/15/11 0407  NA 132* 135  K 3.7 3.9  CL 96 97  CO2 32 31  GLUCOSE 112* 129*  BUN 20 27*  CREATININE 1.03 0.95  CALCIUM 8.4 8.4    PT/INR: No results found for this basename: LABPROT,INR in  the last 72 hours Radiology: Dg Chest 2 View  07/17/2011  *RADIOLOGY REPORT*  Clinical Data: Post CABG.  Weakness, shortness of breath and cough.  CHEST - 2 VIEW  Comparison: 07/15/2011.  Findings: Trachea is midline.  Heart size stable.  Sternotomy wires are unchanged in position.  Right PICC tip projects over the SVC/RA junction.  There is central pulmonary vascular congestion with small bilateral pleural effusions and bibasilar air space disease.  Findings appear relatively stable from 07/15/2011.  IMPRESSION: Central pulmonary vascular congestion with small bilateral pleural effusions and bibasilar air space disease, stable.  Original Report Authenticated By: Reyes Ivan, M.D.     Assessment/Plan: S/P Procedure(s) (LRB): CORONARY ARTERY BYPASS GRAFTING (CABG) (N/A) VENTRICULAR SEPTAL DEFECT (VSD) REPAIR (N/A) EXCISION MASS  (N/A) Mobilize Plan for transfer to step-down: see transfer orders Small residual VSD noted on echo    [name] MD  Beeper [phone] Office [phone] 07/17/2011 1:28 PM

## 2011-07-17 NOTE — Progress Notes (Signed)
Physical Therapy Treatment Patient Details Name: [name] MRN: [id number] DOB: 04/23/1925 Today's Date: 07/17/2011  PT Assessment/Plan  PT - Assessment/Plan Comments on Treatment Session: Continues to progress well. Pt may request to go to [facility] for rehab as she feels she needs longer for recovery. Will continue to follow. Our treatment plan will not change.  PT Plan: Discharge plan remains appropriate (SNF vs CIR);Frequency remains appropriate Equipment Recommended: Defer to next venue PT Goals  Acute Rehab PT Goals PT Goal: Sit to Stand - Progress: Progressing toward goal PT Goal: Stand to Sit - Progress: Progressing toward goal PT Goal: Stand - Progress: Progressing toward goal PT Goal: Ambulate - Progress: Progressing toward goal PT Goal: Perform Home Exercise Program - Progress: Progressing toward goal  PT Treatment Precautions/Restrictions  Precautions Precautions: Sternal Restrictions Weight Bearing Restrictions: No Mobility (including Balance) Bed Mobility Bed Mobility: No Transfers Sit to Stand: 4: Min assist;From chair/3-in-1 Sit to Stand Details (indicate cue type and reason): minA for sequencing especially for anterior translation and follow through to stand with faciliation at sacrum Stand to Sit: To chair/3-in-1 Stand to Sit Details: mingaurdA with verbal cues for controlled descent to chair and decreased use of arms Ambulation/Gait Ambulation/Gait Assistance: 4: Min assist Ambulation/Gait Assistance Details (indicate cue type and reason): amb approx 60 ft with RW; cues for forward gaze and upright posture; pt with very slow gaurded gait Ambulation Distance (Feet): 60 Feet Assistive device: Rolling walker Gait Pattern: Trunk flexed;Shuffle  Posture/Postural Control Posture/Postural Control: No significant limitations Exercise  Total Joint Exercises Ankle Circles/Pumps: AROM;20 reps;Seated;Both End of Session PT - End of Session Equipment Utilized  During Treatment: Gait belt Activity Tolerance: Patient tolerated treatment well;Patient limited by fatigue Patient left: in chair Nurse Communication: Mobility status for transfers;Mobility status for ambulation General Behavior During Session: [facility] for tasks performed Cognition: [facility] for tasks performed  [facility] HELEN 07/17/2011, 10:49 AM

## 2011-07-17 NOTE — Progress Notes (Signed)
Rehab admissions - I have talked with patient and she is now agreeable to inpatient rehab.  I have faxed info to [facility] carrier requesting inpatient rehab admission.  Will follow up in am after I hear back from insurance case manager.  Pager [phone]

## 2011-07-17 NOTE — Progress Notes (Signed)
Rehab admissions - Evaluated for possible admission.  I spoke with patient and daughter.  Patient was at [facility] after TKR and liked that facility.  Patient does not feel that inpatient rehab will give her enough recovery time.  Patient and daughter to discuss rehab options.  I will talk with them again later today.  At this point, patient is leaning toward [facility] SNF.  Pager [phone]

## 2011-07-17 NOTE — Progress Notes (Signed)
Occupational Therapy Treatment Patient Details Name: [name] MRN: [id number] DOB: July 21, 1924 Today's Date: 07/17/2011  OT Assessment/Plan OT Assessment/Plan Comments on Treatment Session: Pt. fatigues quickly.  Pt. requires cues for sternal precautions, and assist for balance deficits OT Plan: Discharge plan remains appropriate OT Frequency: Min 2X/week Follow Up Recommendations: Inpatient Rehab Equipment Recommended: Defer to next venue OT Goals ADL Goals ADL Goal: Grooming - Progress: Not met ADL Goal: Toilet Transfer - Progress: Progressing toward goals ADL Goal: Toileting - Clothing Manipulation - Progress: Progressing toward goals ADL Goal: Toileting - Hygiene - Progress: Progressing toward goals Miscellaneous OT Goals OT Goal: Miscellaneous Goal #1 - Progress: Progressing toward goals  OT Treatment Precautions/Restrictions  Precautions Precautions: Sternal Restrictions Weight Bearing Restrictions: No   ADL ADL Grooming: Performed;Teeth care;Set up Where Assessed - Grooming: Sitting, chair Toilet Transfer: Performed;+2 Total assistance;Other (comment) (pt 70%) Toilet Transfer Details (indicate cue type and reason): Pt. ambulated to BR with total A +2 HHA (pt ~70%) Toilet Transfer Method: Proofreader: Comfort height toilet Toileting - Clothing Manipulation: Maximal assistance;Performed Where Assessed - Glass blower/designer Manipulation: Standing Toileting - Hygiene: Performed;Set up Where Assessed - Toileting Hygiene: Sit on 3-in-1 or toilet Ambulation Related to ADLs: Pt. ambulated to the BR with +2 total A/HHA (pt ~70%).  Pt. requires min verbal cues for sternal precautions ADL Comments: Pt. sitting up in chair finishing up brushing teeth.  Pt. reports fatigue.  Pt. ambulated to BR.  Attempted grooming at sink, but pt. too fatigued.  Pt. ambulated back to bed. Mobility  Bed Mobility Bed Mobility: Yes Sit to Sidelying Left: 1: +2 Total  assist;Patient percentage (comment) (pt ~50%)) Sit to Sidelying Left Details (indicate cue type and reason): min cues for sternal precautions Transfers Transfers: Yes Sit to Stand: 4: Min assist;From chair/3-in-1 Sit to Stand Details (indicate cue type and reason): min verbal cues for sternal precautions Stand to Sit: To chair/3-in-1 Stand to Sit Details: min verbal cues for sternal precautions Exercises    End of Session OT - End of Session Activity Tolerance: Patient limited by fatigue Patient left: in bed;with call bell in reach Nurse Communication: Mobility status for transfers;Mobility status for ambulation General Behavior During Session: [facility] for tasks performed Cognition: [facility] for tasks performed  Assad Harbeson, Ursula Alert M  07/17/2011, 3:21 PM

## 2011-07-18 ENCOUNTER — Inpatient Hospital Stay (HOSPITAL_COMMUNITY)
Admission: RE | Admit: 2011-07-18 | Discharge: 2011-07-31 | DRG: 945 | Disposition: A | Discharge status: Home Health | Payer: Medicare Other | Source: Ambulatory Visit | Attending: Physical Medicine & Rehabilitation | Admitting: Physical Medicine & Rehabilitation

## 2011-07-18 DIAGNOSIS — E785 Hyperlipidemia, unspecified: Secondary | ICD-10-CM | POA: Diagnosis present

## 2011-07-18 DIAGNOSIS — Z951 Presence of aortocoronary bypass graft: Secondary | ICD-10-CM

## 2011-07-18 DIAGNOSIS — Z5189 Encounter for other specified aftercare: Principal | ICD-10-CM

## 2011-07-18 DIAGNOSIS — R5381 Other malaise: Secondary | ICD-10-CM

## 2011-07-18 DIAGNOSIS — I10 Essential (primary) hypertension: Secondary | ICD-10-CM | POA: Diagnosis present

## 2011-07-18 DIAGNOSIS — C37 Malignant neoplasm of thymus: Secondary | ICD-10-CM | POA: Diagnosis present

## 2011-07-18 DIAGNOSIS — I510 Cardiac septal defect, acquired: Secondary | ICD-10-CM | POA: Diagnosis present

## 2011-07-18 DIAGNOSIS — Z96659 Presence of unspecified artificial knee joint: Secondary | ICD-10-CM

## 2011-07-18 DIAGNOSIS — Q210 Ventricular septal defect: Secondary | ICD-10-CM

## 2011-07-18 DIAGNOSIS — I2510 Atherosclerotic heart disease of native coronary artery without angina pectoris: Secondary | ICD-10-CM

## 2011-07-18 DIAGNOSIS — I2109 ST elevation (STEMI) myocardial infarction involving other coronary artery of anterior wall: Secondary | ICD-10-CM | POA: Diagnosis present

## 2011-07-18 DIAGNOSIS — D649 Anemia, unspecified: Secondary | ICD-10-CM | POA: Diagnosis present

## 2011-07-18 LAB — CBC
HCT: 29.2 % — ABNORMAL LOW (ref 36.0–46.0)
Hemoglobin: 9.6 g/dL — ABNORMAL LOW (ref 12.0–15.0)
MCH: 29.5 pg (ref 26.0–34.0)
MCHC: 32.9 g/dL (ref 30.0–36.0)
MCV: 89.8 fL (ref 78.0–100.0)
Platelets: 444 10*3/uL — ABNORMAL HIGH (ref 150–400)
RBC: 3.25 MIL/uL — ABNORMAL LOW (ref 3.87–5.11)
RDW: 15.7 % — ABNORMAL HIGH (ref 11.5–15.5)
WBC: 18.6 10*3/uL — ABNORMAL HIGH (ref 4.0–10.5)

## 2011-07-18 LAB — BASIC METABOLIC PANEL WITH GFR
BUN: 21 mg/dL (ref 6–23)
CO2: 29 meq/L (ref 19–32)
Calcium: 8.8 mg/dL (ref 8.4–10.5)
Chloride: 98 meq/L (ref 96–112)
Creatinine, Ser: 1.06 mg/dL (ref 0.50–1.10)
GFR calc Af Amer: 54 mL/min — ABNORMAL LOW
GFR calc non Af Amer: 47 mL/min — ABNORMAL LOW
Glucose, Bld: 148 mg/dL — ABNORMAL HIGH (ref 70–99)
Potassium: 4.1 meq/L (ref 3.5–5.1)
Sodium: 134 meq/L — ABNORMAL LOW (ref 135–145)

## 2011-07-18 MED ORDER — CARVEDILOL 6.25 MG PO TABS
6.2500 mg | ORAL_TABLET | Freq: Two times a day (BID) | ORAL | Status: DC
  Administered 2011-07-18 – 2011-07-31 (×25): 6.25 mg via ORAL
  Filled 2011-07-18 (×28): qty 1

## 2011-07-18 MED ORDER — PANTOPRAZOLE SODIUM 40 MG PO TBEC: 40.0000 mg | DELAYED_RELEASE_TABLET | Freq: Every day | ORAL | Status: DC

## 2011-07-18 MED ORDER — ASPIRIN EC 325 MG PO TBEC
325.0000 mg | DELAYED_RELEASE_TABLET | Freq: Every day | ORAL | Status: DC
  Administered 2011-07-19 – 2011-07-31 (×13): 325 mg via ORAL
  Filled 2011-07-18 (×15): qty 1

## 2011-07-18 MED ORDER — DIPHENHYDRAMINE HCL 25 MG PO CAPS
25.0000 mg | ORAL_CAPSULE | Freq: Every evening | ORAL | Status: DC | PRN
  Administered 2011-07-18: 25 mg via ORAL
  Filled 2011-07-18 (×2): qty 1

## 2011-07-18 MED ORDER — SENNA 8.6 MG PO TABS
1.0000 | ORAL_TABLET | Freq: Two times a day (BID) | ORAL | Status: DC
  Administered 2011-07-18 – 2011-07-25 (×10): 8.6 mg via ORAL
  Filled 2011-07-18 (×19): qty 1

## 2011-07-18 MED ORDER — TRAMADOL HCL 50 MG PO TABS
50.0000 mg | ORAL_TABLET | Freq: Four times a day (QID) | ORAL | Status: DC | PRN
  Administered 2011-07-18 – 2011-07-19 (×2): 50 mg via ORAL
  Filled 2011-07-18 (×2): qty 1

## 2011-07-18 MED ORDER — POTASSIUM CHLORIDE CRYS ER 20 MEQ PO TBCR
20.0000 meq | EXTENDED_RELEASE_TABLET | Freq: Every day | ORAL | Status: DC
  Administered 2011-07-19 – 2011-07-31 (×13): 20 meq via ORAL
  Filled 2011-07-18 (×15): qty 1

## 2011-07-18 MED ORDER — PANTOPRAZOLE SODIUM 40 MG PO TBEC
40.0000 mg | DELAYED_RELEASE_TABLET | Freq: Every day | ORAL | Status: DC
  Administered 2011-07-19 – 2011-07-31 (×13): 40 mg via ORAL
  Filled 2011-07-18 (×18): qty 1

## 2011-07-18 MED ORDER — RAMIPRIL 2.5 MG PO CAPS
2.5000 mg | ORAL_CAPSULE | Freq: Every day | ORAL | Status: DC
  Administered 2011-07-20 – 2011-07-27 (×7): 2.5 mg via ORAL
  Filled 2011-07-18 (×12): qty 1

## 2011-07-18 MED ORDER — RAMIPRIL 2.5 MG PO CAPS: 2.5000 mg | ORAL_CAPSULE | Freq: Every day | ORAL | Status: DC

## 2011-07-18 MED ORDER — POLYETHYLENE GLYCOL 3350 17 G PO PACK
17.0000 g | PACK | Freq: Every day | ORAL | Status: DC | PRN
  Administered 2011-07-24 – 2011-07-29 (×2): 17 g via ORAL
  Filled 2011-07-18: qty 1

## 2011-07-18 MED ORDER — ACETAMINOPHEN 325 MG PO TABS: 650.0000 mg | ORAL_TABLET | Freq: Four times a day (QID) | ORAL | Status: DC | PRN

## 2011-07-18 MED ORDER — ENSURE CLINICAL ST REVIGOR PO LIQD
237.0000 mL | Freq: Three times a day (TID) | ORAL | Status: DC
  Administered 2011-07-19 – 2011-07-20 (×5): 237 mL via ORAL
  Administered 2011-07-21: 08:00:00 via ORAL
  Administered 2011-07-21 – 2011-07-22 (×2): 237 mL via ORAL
  Administered 2011-07-23: 07:00:00 via ORAL
  Administered 2011-07-24 – 2011-07-25 (×3): 237 mL via ORAL
  Administered 2011-07-25: 21:00:00 via ORAL
  Administered 2011-07-25 – 2011-07-27 (×5): 237 mL via ORAL
  Administered 2011-07-27: 08:00:00 via ORAL
  Administered 2011-07-28 – 2011-07-29 (×3): 237 mL via ORAL
  Administered 2011-07-30: 14:00:00 via ORAL
  Administered 2011-07-30 – 2011-07-31 (×2): 237 mL via ORAL

## 2011-07-18 MED ORDER — ACETAMINOPHEN 325 MG PO TABS
325.0000 mg | ORAL_TABLET | ORAL | Status: DC | PRN
  Administered 2011-07-20 – 2011-07-28 (×4): 650 mg via ORAL
  Filled 2011-07-18 (×5): qty 2

## 2011-07-18 MED ORDER — DIPHENHYDRAMINE HCL 25 MG PO CAPS: 25.0000 mg | ORAL_CAPSULE | Freq: Every evening | ORAL | Status: AC | PRN

## 2011-07-18 MED ORDER — POTASSIUM CHLORIDE CRYS ER 20 MEQ PO TBCR: 20.0000 meq | EXTENDED_RELEASE_TABLET | Freq: Every day | ORAL | Status: DC

## 2011-07-18 MED ORDER — FUROSEMIDE 40 MG PO TABS: 40.0000 mg | ORAL_TABLET | Freq: Every day | ORAL | Status: DC

## 2011-07-18 MED ORDER — PROMETHAZINE HCL 12.5 MG RE SUPP
12.5000 mg | Freq: Four times a day (QID) | RECTAL | Status: DC | PRN
  Filled 2011-07-18: qty 1

## 2011-07-18 MED ORDER — DSS 100 MG PO CAPS: 200.0000 mg | ORAL_CAPSULE | Freq: Every day | ORAL | Status: AC

## 2011-07-18 MED ORDER — SODIUM CHLORIDE 0.9 % IJ SOLN
10.0000 mL | INTRAMUSCULAR | Status: DC | PRN
  Administered 2011-07-18: 20 mL
  Administered 2011-07-19 – 2011-07-21 (×6): 10 mL

## 2011-07-18 MED ORDER — CARVEDILOL 6.25 MG PO TABS: 6.2500 mg | ORAL_TABLET | Freq: Two times a day (BID) | ORAL | Status: DC

## 2011-07-18 MED ORDER — ROSUVASTATIN CALCIUM 5 MG PO TABS: 5.0000 mg | ORAL_TABLET | Freq: Every day | ORAL | Status: DC

## 2011-07-18 MED ORDER — ENSURE CLINICAL ST REVIGOR PO LIQD: 237.0000 mL | Freq: Three times a day (TID) | ORAL | Status: DC

## 2011-07-18 MED ORDER — PROMETHAZINE HCL 25 MG/ML IJ SOLN: 12.5000 mg | Freq: Four times a day (QID) | INTRAMUSCULAR | Status: DC | PRN

## 2011-07-18 MED ORDER — ASPIRIN 325 MG PO TBEC: 325.0000 mg | DELAYED_RELEASE_TABLET | Freq: Every day | ORAL | Status: AC

## 2011-07-18 MED ORDER — ROSUVASTATIN CALCIUM 5 MG PO TABS
5.0000 mg | ORAL_TABLET | Freq: Every day | ORAL | Status: DC
  Administered 2011-07-19 – 2011-07-30 (×12): 5 mg via ORAL
  Filled 2011-07-18 (×13): qty 1

## 2011-07-18 MED ORDER — FUROSEMIDE 40 MG PO TABS
40.0000 mg | ORAL_TABLET | Freq: Every day | ORAL | Status: DC
  Administered 2011-07-19: 40 mg via ORAL
  Filled 2011-07-18 (×3): qty 1

## 2011-07-18 MED ORDER — PROMETHAZINE HCL 12.5 MG PO TABS
12.5000 mg | ORAL_TABLET | Freq: Four times a day (QID) | ORAL | Status: DC | PRN
  Administered 2011-07-21: 12.5 mg via ORAL
  Filled 2011-07-18: qty 1

## 2011-07-18 MED ORDER — GUAIFENESIN 100 MG/5ML PO SOLN
10.0000 mL | ORAL | Status: DC | PRN
  Administered 2011-07-19 – 2011-07-22 (×8): 200 mg via ORAL
  Filled 2011-07-18 (×8): qty 10

## 2011-07-18 MED ORDER — SORBITOL 70 % SOLN: 30.0000 mL | Freq: Every day | Status: DC | PRN

## 2011-07-18 NOTE — Progress Notes (Signed)
UR Completed.  [name] Jane [phone] 07/18/2011  

## 2011-07-18 NOTE — Progress Notes (Signed)
Patient ID: [name], female   DOB: 1924-10-30, 78 y.o.   MRN: [id number] TCTS DAILY PROGRESS NOTE                   [address]            [place] [address]          [phone]      11 Days Post-Op Procedure(s) (LRB): CORONARY ARTERY BYPASS GRAFTING (CABG) (N/A) VENTRICULAR SEPTAL DEFECT (VSD) REPAIR (N/A) EXCISION MASS (N/A)  Total Length of Stay:  LOS: 11 days   Subjective: Feeling better, walked to end of hall  Objective: Vital signs in last 24 hours: Temp:  [97.3 F (36.3 C)-98.7 F (37.1 C)] 97.3 F (36.3 C) (02/12 0753) Pulse Rate:  [78-104] 85  (02/12 0700) Cardiac Rhythm:  [-] Normal sinus rhythm (02/12 0741) Resp:  [17-29] 27  (02/12 0700) BP: (83-122)/(33-107) 117/50 mmHg (02/12 0700) SpO2:  [89 %-100 %] 100 % (02/12 0700) FiO2 (%):  [0 %] 0 % (02/11 1341) Weight:  [144 lb 2.9 oz (65.4 kg)] 144 lb 2.9 oz (65.4 kg) (02/12 0400)  Filed Weights   07/16/11 0500 07/17/11 0500 07/18/11 0400  Weight: 144 lb 6.4 oz (65.5 kg) 143 lb 8.3 oz (65.1 kg) 144 lb 2.9 oz (65.4 kg)    Weight change: 10.6 oz (0.3 kg)       Intake/Output from previous day: 02/11 0701 - 02/12 0700 In: 1080 [P.O.:960; I.V.:20; IV Piggyback:100] Out: 1426 [Urine:1425; Stool:1]  Intake/Output this shift:    Current Meds: Scheduled Meds:    . aspirin EC  325 mg Oral Daily  . carvedilol  6.25 mg Oral BID WC  . docusate sodium  200 mg Oral Daily  . feeding supplement  237 mL Oral TID WC  . furosemide  40 mg Oral Daily  . moving right along book   Does not apply Once  . pantoprazole  40 mg Oral Q1200  . potassium chloride      . potassium chloride  20 mEq Oral Daily  . ramipril  2.5 mg Oral Daily  . rosuvastatin  5 mg Oral q1800  . sodium chloride  3 mL Intravenous Q12H  . DISCONTD: aspirin  324 mg Per Tube Daily  . DISCONTD: aspirin EC  325 mg Oral Daily  . DISCONTD: bisacodyl  10 mg Oral Daily  . DISCONTD: bisacodyl  10 mg Rectal Daily  . DISCONTD:  docusate sodium  200 mg Oral Daily  . DISCONTD: insulin aspart  0-15 Units Subcutaneous TID WC  . DISCONTD: insulin aspart  0-5 Units Subcutaneous QHS  . DISCONTD: metoprolol tartrate  12.5 mg Per Tube BID  . DISCONTD: metoprolol tartrate  12.5 mg Oral BID  . DISCONTD: pantoprazole  40 mg Oral Q1200  . DISCONTD: sodium chloride  10 mL Intravenous Q12H  . DISCONTD: sodium chloride  10-40 mL Intracatheter Q12H   Continuous Infusions:    . DISCONTD: sodium chloride 20 mL/hr at 07/15/11 1500  . DISCONTD: sodium chloride 20 mL/hr at 07/14/11 2000  . DISCONTD: sodium chloride 250 mL (07/13/11 0430)  . DISCONTD: lactated ringers 10 mL/hr at 07/11/11 0700   PRN Meds:.sodium chloride, acetaminophen, bisacodyl, bisacodyl, diphenhydrAMINE, ondansetron (ZOFRAN) IV, ondansetron, potassium chloride, sodium chloride, DISCONTD: diphenhydrAMINE, DISCONTD: metoprolol, DISCONTD: ondansetron (ZOFRAN) IV, DISCONTD: sodium chloride, DISCONTD: sodium chloride, DISCONTD: traMADol  General appearance: alert, cooperative and no distress Neurologic: intact Heart: regular rate and rhythm, S1, S2 normal,soft systolic  Murmur at apex, click, rub or gallop Lungs: clear to auscultation bilaterally Abdomen: soft, non-tender; bowel sounds normal; no masses,  no organomegaly Extremities: extremities normal, atraumatic, no cyanosis or edema Wound: sternum stable  Lab Results: CBC:  Basename 07/18/11 0445 07/17/11 0330  WBC 18.6* 16.9*  HGB 9.6* 9.1*  HCT 29.2* 28.3*  PLT 444* 382   BMET:   Basename 07/18/11 0445 07/17/11 0330  NA 134* 132*  K 4.1 3.7  CL 98 96  CO2 29 32  GLUCOSE 148* 112*  BUN 21 20  CREATININE 1.06 1.03  CALCIUM 8.8 8.4    PT/INR: No results found for this basename: LABPROT,INR in the last 72 hours Radiology: Dg Chest 2 View  07/17/2011  *RADIOLOGY REPORT*  Clinical Data: Post CABG.  Weakness, shortness of breath and cough.  CHEST - 2 VIEW  Comparison: 07/15/2011.  Findings: Trachea  is midline.  Heart size stable.  Sternotomy wires are unchanged in position.  Right PICC tip projects over the SVC/RA junction.  There is central pulmonary vascular congestion with small bilateral pleural effusions and bibasilar air space disease.  Findings appear relatively stable from 07/15/2011.  IMPRESSION: Central pulmonary vascular congestion with small bilateral pleural effusions and bibasilar air space disease, stable.  Original Report Authenticated By: Reyes Ivan, M.D.     Assessment/Plan: S/P Procedure(s) (LRB): CORONARY ARTERY BYPASS GRAFTING (CABG) (N/A) VENTRICULAR SEPTAL DEFECT (VSD) REPAIR (N/A) EXCISION MASS (N/A) Mobilize Plan for transfer to step-down: see transfer orders Waiting for Rehab bed Small residual VSD noted on echo    [name] MD  Beeper [phone] Office [phone] 07/18/2011 7:59 AM

## 2011-07-18 NOTE — H&P (Signed)
Physical Medicine and Rehabilitation Admission H&P  [name] is an 78 y.o. female.   No chief complaint on file. : HPI: [name] is an 78 y.o. right-handed female with history of HTN, 3 week history of rest and exertional dyspnea, admitted 02/01 with sudden onset of chest pain with nausea, vomiting and dyspnea the night PTA with findings of acute anterior STEMI. Code STEMI was called. Patient taken to cath lab emergently by Dr. Eden Emms. This revealed large anterior MI with mechanical complication of ventricular septal defect. Dr Tyrone Sage consulted and patient taken to OR the same day for CABG X2, VSD repair and excision of mediastinal mass. Post op abla Treated with transfusion and latest hemoglobin of 9.6. Extubated 02/04. Pathology pending-likely malignant thymoma with planned followup  radiation oncology as an outpatient.. Patient with noted deconditioning after hospital course and easily fatigued with physical medicine and rehabilitation consulted for consideration of inpatient rehabilitation services  Review of Systems  Eyes: Negative for blurred vision and double vision.  Respiratory: Positive for cough.  Cardiovascular: Positive for chest pain (chest wall pain.).  Gastrointestinal: Positive for diarrhea. Negative for heartburn and nausea.  Genitourinary: Negative for urgency and frequency.  Musculoskeletal: Positive for myalgias.  Neurological: Negative for headaches.  All other systems reviewed and are negative   Past Medical History  Diagnosis Date  . Acute MI, anterior wall     a. 07/07/2011  . HTN (hypertension)   . Osteoarthritis of knee     Bilateral.  a. s/p LTKA ~2007;  b. s/p RTKA 10/2010  . CAD (coronary artery disease)     a. 07/07/2011 - Occluded LAD  . VSD (ventricular septal defect)     a.  07/07/2011 - Noted @ time of MI   Past Surgical History  Procedure Date  . Joint replacement   . Total knee arthroplasty     a.  Left ~ 2007, Right 10/2010  . Cataract  extraction, bilateral   . Coronary artery bypass graft 07/07/2011    Procedure: CORONARY ARTERY BYPASS GRAFTING (CABG);  Surgeon: Kathlee Nations Suann Larry, MD;  Location: [facility] OR;  Service: Open Heart Surgery;  Laterality: N/A;  times two using greater saphenous vein harvested via endovascular vein harvest  . Vsd repair 07/07/2011    Procedure: VENTRICULAR SEPTAL DEFECT (VSD) REPAIR;  Surgeon: Mikey Bussing, MD;  Location: [facility] OR;  Service: Open Heart Surgery;  Laterality: N/A;  with bovine pericardium 4cm x 4cm  . Mass excision 07/07/2011    Procedure: EXCISION MASS;  Surgeon: Kathlee Nations Suann Larry, MD;  Location: [facility] OR;  Service: Open Heart Surgery;  Laterality: N/A;  Excision of mediastinal mass   History reviewed. No pertinent family history. Social History:  reports that she has never smoked. She has never used smokeless tobacco. She reports that she does not drink alcohol or use illicit drugs. Allergies: No Known Allergies Medications Prior to Admission  Medication Dose Route Frequency Provider Last Rate Last Dose  . 0.9 %  sodium chloride infusion  250 mL Intravenous PRN [name], MD      . acetaminophen (TYLENOL) solution 650 mg  650 mg Per Tube NOW Ardelle Balls, PA       Or  . acetaminophen (TYLENOL) suppository 650 mg  650 mg Rectal NOW Ardelle Balls, PA      . acetaminophen (TYLENOL) tablet 1,000 mg  1,000 mg Oral Q6H Ardelle Balls, PA   1,000 mg at 07/12/11  2004   Or  . acetaminophen (TYLENOL) solution 975 mg  975 mg Per Tube Q6H Ardelle Balls, PA   975 mg at 07/10/11 0121  . acetaminophen (TYLENOL) tablet 650 mg  650 mg Oral Q6H PRN [name], MD      . albumin human 5 % solution 250 mL  250 mL Intravenous Q15 min PRN Ardelle Balls, PA   250 mL at 07/08/11 0412  . aspirin EC tablet 325 mg  325 mg Oral Daily [name], MD   325 mg at 07/18/11 0934  . bisacodyl (DULCOLAX) EC tablet 10 mg  10 mg Oral Daily PRN [name], MD        Or  . bisacodyl (DULCOLAX) suppository 10 mg  10 mg Rectal Daily PRN [name], MD      . bivalirudin (ANGIOMAX) 250 MG injection           . carvedilol (COREG) tablet 6.25 mg  6.25 mg Oral BID WC [name], MD   6.25 mg at 07/18/11 4098  . cefUROXime (ZINACEF) 1.5 g in dextrose 5 % 50 mL IVPB  1.5 g Intravenous Q12H Ardelle Balls, PA   1.5 g at 07/09/11 1036  . chlorhexidine (PERIDEX) 0.12 % solution        15 mL at 07/08/11 0816  . chlorhexidine (PERIDEX) 0.12 % solution        15 mL at 07/09/11 1020  . dextrose 50 % solution        14 mL at 07/07/11 2324  . diphenhydrAMINE (BENADRYL) capsule 25 mg  25 mg Oral QHS PRN Purcell Nails, MD   25 mg at 07/17/11 2205  . docusate sodium (COLACE) capsule 200 mg  200 mg Oral Daily [name], MD      . famotidine (PEPCID) IVPB 20 mg  20 mg Intravenous Q12H Ardelle Balls, PA   20 mg at 07/08/11 1059  . feeding supplement (ENSURE CLINICAL STRENGTH) liquid 237 mL  237 mL Oral TID WC [name], MD   237 mL at 07/18/11 0811  . furosemide (LASIX) injection 40 mg  40 mg Intravenous Once [name], MD   40 mg at 07/09/11 2007  . furosemide (LASIX) injection 40 mg  40 mg Intravenous BID [name], MD   40 mg at 07/10/11 1801  . furosemide (LASIX) injection 40 mg  40 mg Intravenous Once [name], MD   40 mg at 07/11/11 1032  . furosemide (LASIX) tablet 40 mg  40 mg Oral Daily [name], MD   40 mg at 07/18/11 0935  . heparin 2-0.9 UNIT/ML-% infusion           . heparin 2-0.9 UNIT/ML-% infusion           . lactated ringers infusion 500 mL  500 mL Intravenous Once PRN Ardelle Balls, PA 500 mL/hr at 07/07/11 2300 500 mL at 07/07/11 2300  . lidocaine (XYLOCAINE) 1 % injection           . lidocaine (XYLOCAINE) 1 % injection           . magnesium sulfate IVPB 4 g 100 mL  4 g Intravenous Once Ardelle Balls, PA   4 g at 07/07/11 2138  . morphine 2 MG/ML injection 1-4 mg   1-4 mg Intravenous Q1H PRN Ardelle Balls, PA      . moving right along  book   Does not apply Once [name], MD      . nitroGLYCERIN (NTG ON-CALL) 0.2 mg/mL injection           . nitroglycerin/verapamil/heparin/sodium bicarbonate solution irrigation for artery spasm   Irrigation To OR [name], MD      . ondansetron [facility]) tablet 4 mg  4 mg Oral Q6H PRN [name], MD       Or  . ondansetron [facility]) injection 4 mg  4 mg Intravenous Q6H PRN [name], MD      . pantoprazole (PROTONIX) EC tablet 40 mg  40 mg Oral Q1200 [name], MD      . potassium chloride 10 mEq in 50 mL *CENTRAL LINE* IVPB  10 mEq Intravenous Q1 Hr x 3 Ardelle Balls, PA   10 mEq at 07/07/11 2342  . potassium chloride 10 mEq in 50 mL *CENTRAL LINE* IVPB  10 mEq Intravenous Q1 Hr x 3 [name], MD   10 mEq at 07/10/11 0759  . potassium chloride 10 mEq in 50 mL *CENTRAL LINE* IVPB  10 mEq Intravenous Q1 Hr x 3 [name], MD   10 mEq at 07/11/11 0855  . potassium chloride 10 mEq in 50 mL *CENTRAL LINE* IVPB  10 mEq Intravenous Q1H PRN [name], MD   10 mEq at 07/14/11 0656  . potassium chloride 10 mEq in 50 mL *CENTRAL LINE* IVPB  10 mEq Intravenous Q1H PRN Purcell Nails, MD   10 mEq at 07/17/11 0730  . potassium chloride 10 MEQ/50ML IVPB        10 mEq at 07/14/11 0545  . potassium chloride 10 MEQ/50ML IVPB        10 mEq at 07/17/11 0907  . potassium chloride SA (K-DUR,KLOR-CON) CR tablet 20 mEq  20 mEq Oral Daily [name], MD   20 mEq at 07/18/11 0935  . ramipril (ALTACE) capsule 2.5 mg  2.5 mg Oral Daily [name], MD      . rosuvastatin (CRESTOR) tablet 5 mg  5 mg Oral q1800 [name], MD   5 mg at 07/17/11 1755  . sodium chloride 0.9 % injection 3 mL  3 mL Intravenous Q12H [name], MD   3 mL at 07/18/11 0935  . sodium chloride 0.9 % injection 3 mL  3 mL Intravenous PRN [name], MD      . vancomycin  (VANCOCIN) 1,000 mg in sodium chloride 0.9 % 100 mL IVPB  1,000 mg Intravenous Once Ardelle Balls, PA   1,000 mg at 07/08/11 0339  . DISCONTD: 0.45 % sodium chloride infusion   Intravenous Continuous Ardelle Balls, PA 20 mL/hr at 07/15/11 1500    . DISCONTD: 0.9 %  sodium chloride infusion  250 mL Intravenous PRN Ok Anis, NP      . DISCONTD: 0.9 %  sodium chloride infusion  250 mL Intravenous PRN Ok Anis, NP      . DISCONTD: 0.9 %  sodium chloride infusion    Continuous PRN Andree Elk, CRNA      . DISCONTD: 0.9 %  sodium chloride infusion   Intravenous Continuous Ardelle Balls, PA 20 mL/hr at 07/14/11 2000    . DISCONTD: 0.9 %  sodium chloride infusion  250 mL Intravenous Continuous Ardelle Balls, PA 1 mL/hr at 07/13/11 0430 250 mL at 07/13/11 0430  . DISCONTD:  0.9 % irrigation (POUR BTL)    PRN Kathlee Nations Trigt III, MD   1,000 mL at 07/07/11 1652  . DISCONTD: acetaminophen (TYLENOL) tablet 650 mg  650 mg Oral Q4H PRN Ok Anis, NP      . DISCONTD: acetaminophen (TYLENOL) tablet 650 mg  650 mg Oral Q4H PRN Ok Anis, NP      . DISCONTD: albumin human 5 % solution    Continuous PRN Andree Elk, CRNA      . DISCONTD: ALPRAZolam Prudy Feeler) tablet 0.25 mg  0.25 mg Oral BID PRN Ok Anis, NP      . DISCONTD: ALPRAZolam Prudy Feeler) tablet 0.25 mg  0.25 mg Oral BID PRN Ok Anis, NP      . DISCONTD: aminocaproic acid (AMICAR) 10 g in sodium chloride 0.9 % 100 mL infusion   Intravenous To OR [name], MD      . DISCONTD: aminocaproic acid (AMICAR) 10 g in sodium chloride 0.9 % 100 mL infusion   Intravenous To OR [name], MD      . DISCONTD: aminocaproic acid (AMICAR) 10 g in sodium chloride 0.9 % 140 mL infusion  10 g  Continuous PRN Andree Elk, CRNA   1 g/hr at 07/07/11 1615  . DISCONTD: aspirin chewable tablet 324 mg  324 mg Per Tube Daily Ardelle Balls, PA   324 mg at  07/10/11 1005  . DISCONTD: aspirin EC tablet 325 mg  325 mg Oral Daily Ardelle Balls, PA   325 mg at 07/17/11 0930  . DISCONTD: aspirin EC tablet 81 mg  81 mg Oral Daily Ok Anis, NP      . DISCONTD: aspirin EC tablet 81 mg  81 mg Oral Daily Ok Anis, NP      . DISCONTD: bisacodyl (DULCOLAX) EC tablet 10 mg  10 mg Oral Daily Ardelle Balls, PA   10 mg at 07/16/11 1059  . DISCONTD: bisacodyl (DULCOLAX) suppository 10 mg  10 mg Rectal Daily Ardelle Balls, PA   10 mg at 07/10/11 1005  . DISCONTD: cefUROXime (ZINACEF) 1.5 g in dextrose 5 % 50 mL IVPB  1.5 g Intravenous To OR [name], MD      . DISCONTD: cefUROXime (ZINACEF) 1.5 g in dextrose 5 % 50 mL IVPB  1.5 g Intravenous To OR [name], MD      . DISCONTD: cefUROXime (ZINACEF) 1.5 g in dextrose 5 % 50 mL IVPB  1.5 g Intravenous To OR [name], MD      . DISCONTD: cefUROXime (ZINACEF) 1.5 g in dextrose 5 % 50 mL IVPB  1.5 g  Continuous PRN Andree Elk, CRNA   1.5 g at 07/07/11 1545  . DISCONTD: cefUROXime (ZINACEF) 750 mg in dextrose 5 % 50 mL IVPB  750 mg Intravenous To OR [name], MD      . DISCONTD: cefUROXime (ZINACEF) 750 mg in dextrose 5 % 50 mL IVPB  750 mg Intravenous To OR [name], MD      . DISCONTD: chlorhexidine (HIBICLENS) 4 % liquid 4 application  60 mL Topical Once Ardelle Balls, PA      . DISCONTD: chlorhexidine (HIBICLENS) 4 % liquid 4 application  60 mL Topical Once Ardelle Balls, PA      . DISCONTD: chlorhexidine (HIBICLENS) 4 % liquid 4 application  60 mL Topical Once Ardelle Balls, PA      .  DISCONTD: dexmedetomidine (PRECEDEX) 200 mcg in sodium chloride 0.9 % 50 mL infusion  200 mcg  Continuous PRN Andree Elk, CRNA 11 mL/hr at 07/07/11 2012 0.7 mcg/kg/hr at 07/07/11 2012  . DISCONTD: dexmedetomidine (PRECEDEX) 200 mcg in sodium chloride 0.9 % 50 mL infusion  0.1-0.7 mcg/kg/hr Intravenous Continuous  Ardelle Balls, PA   0.1 mcg/kg/hr at 07/10/11 1200  . DISCONTD: dexmedetomidine (PRECEDEX) 400 mcg in sodium chloride 0.9 % 100 mL infusion  0.1-0.7 mcg/kg/hr Intravenous To OR [name], MD      . DISCONTD: dexmedetomidine (PRECEDEX) 400 mcg in sodium chloride 0.9 % 100 mL infusion  0.1-0.7 mcg/kg/hr Intravenous To OR [name], MD      . DISCONTD: diphenhydrAMINE (BENADRYL) capsule 25 mg  25 mg Oral QHS PRN Kathlee Nations Trigt III, MD   25 mg at 07/16/11 2116  . DISCONTD: docusate (COLACE) 50 MG/5ML liquid 200 mg  200 mg Oral Daily [name], MD   200 mg at 07/10/11 1005  . DISCONTD: docusate sodium (COLACE) capsule 200 mg  200 mg Oral Daily Ardelle Balls, PA      . DISCONTD: docusate sodium (COLACE) capsule 200 mg  200 mg Oral Daily [name], MD   200 mg at 07/16/11 1059  . DISCONTD: DOPamine (INTROPIN) 800 mg in dextrose 5 % 250 mL infusion  2-20 mcg/kg/min Intravenous To OR [name], MD      . DISCONTD: DOPamine (INTROPIN) 800 mg in dextrose 5 % 250 mL infusion  2-20 mcg/kg/min Intravenous To OR [name], MD      . DISCONTD: DOPamine (INTROPIN) 800 mg in dextrose 5 % 250 mL infusion    Continuous PRN Tyrone Nine, CRNA 3.5 mL/hr at 07/07/11 1805 3 mcg/kg/min at 07/07/11 1805  . DISCONTD: DOPamine (INTROPIN) 800 mg in dextrose 5 % 250 mL infusion  0-3 mcg/kg/min Intravenous Continuous Ardelle Balls, PA 3.5 mL/hr at 07/11/11 1800 3 mcg/kg/min at 07/11/11 1800  . DISCONTD: EPINEPHrine (ADRENALIN) 4,000 mcg in dextrose 5 % 250 mL infusion  0.5-20 mcg/min Intravenous To OR [name], MD      . DISCONTD: EPINEPHrine (ADRENALIN) 4,000 mcg in dextrose 5 % 250 mL infusion  0.5-20 mcg/min Intravenous To OR [name], MD      . DISCONTD: etomidate (AMIDATE) injection    PRN Andree Elk, CRNA   16 mg at 07/07/11 1538  . DISCONTD: fentaNYL (SUBLIMAZE) injection    PRN Andree Elk, CRNA   250 mcg at 07/07/11 1941    . DISCONTD: furosemide (LASIX) injection 40 mg  40 mg Intravenous BID [name], MD   40 mg at 07/15/11 0855  . DISCONTD: furosemide (LASIX) tablet 40 mg  40 mg Oral Daily Kathlee Nations Suann Larry, MD      . DISCONTD: hemostatic agents    PRN Kathlee Nations Suann Larry, MD   1 application at 07/07/11 1725  . DISCONTD: hemostatic agents    PRN [name], MD   1 application at 07/07/11 1838  . DISCONTD: heparin ADULT infusion 100 units/ml (25000 units/250 ml)  750 Units/hr Intravenous Continuous Wendall Stade, MD 7.5 mL/hr at 07/07/11 1406 750 Units/hr at 07/07/11 1406  . DISCONTD: heparin injection    PRN Andree Elk, CRNA   28,000 Units at 07/07/11 1646  . DISCONTD: insulin aspart (novoLOG) injection 0-15 Units  0-15 Units Subcutaneous TID WC Kathlee Nations Trigt III,  MD   3 Units at 07/17/11 1216  . DISCONTD: insulin aspart (novoLOG) injection 0-24 Units  0-24 Units Subcutaneous Q4H [name], MD   2 Units at 07/14/11 2021  . DISCONTD: insulin aspart (novoLOG) injection 0-5 Units  0-5 Units Subcutaneous QHS Kathlee Nations Trigt III, MD      . DISCONTD: insulin regular (NOVOLIN R,HUMULIN R) 1 Units/mL in sodium chloride 0.9 % 100 mL infusion   Intravenous To OR [name], MD      . DISCONTD: insulin regular (NOVOLIN R,HUMULIN R) 1 Units/mL in sodium chloride 0.9 % 100 mL infusion   Intravenous To OR [name], MD      . DISCONTD: insulin regular (NOVOLIN R,HUMULIN R) 1 Units/mL in sodium chloride 0.9 % 100 mL infusion   Intravenous Continuous Ardelle Balls, PA 2.8 mL/hr at 07/08/11 1400 2.8 Units/hr at 07/08/11 1400  . DISCONTD: insulin regular (NOVOLIN R,HUMULIN R) 100 Units in sodium chloride 0.9 % 100 mL infusion  100 Units  Continuous PRN Andree Elk, CRNA 8.7 mL/hr at 07/07/11 2005 8.7 Units/hr at 07/07/11 2005  . DISCONTD: insulin regular bolus via infusion 0-10 Units  0-10 Units Intravenous TID WC Ardelle Balls, PA   2.8 Units at 07/08/11 1400  .  DISCONTD: lactated ringers infusion    Continuous PRN Andree Elk, CRNA      . DISCONTD: lactated ringers infusion    Continuous PRN Andree Elk, CRNA      . DISCONTD: lactated ringers infusion    Continuous PRN Andree Elk, CRNA      . DISCONTD: lactated ringers infusion   Intravenous Continuous Ardelle Balls, PA 10 mL/hr at 07/11/11 0700    . DISCONTD: magnesium sulfate (IV Push/IM) injection 40 mEq  40 mEq Other To OR [name], MD      . DISCONTD: magnesium sulfate (IV Push/IM) injection 40 mEq  40 mEq Other To OR [name], MD      . DISCONTD: metoprolol (LOPRESSOR) injection 2.5-5 mg  2.5-5 mg Intravenous Q2H PRN Ardelle Balls, PA      . DISCONTD: metoprolol tartrate (LOPRESSOR) 25 mg/10 mL oral suspension 12.5 mg  12.5 mg Per Tube BID Ardelle Balls, PA      . DISCONTD: metoprolol tartrate (LOPRESSOR) tablet 12.5 mg  12.5 mg Oral BID Ardelle Balls, PA   12.5 mg at 07/17/11 0930  . DISCONTD: midazolam (VERSED) 5 MG/5ML injection    PRN Andree Elk, CRNA   2 mg at 07/07/11 1859  . DISCONTD: midazolam (VERSED) injection 2 mg  2 mg Intravenous Q1H PRN Ardelle Balls, PA   1 mg at 07/10/11 0227  . DISCONTD: milrinone (PRIMACOR) infusion 200 mcg/mL (0.2 mg/mL)  0.3 mcg/kg/min Intravenous Continuous Ardelle Balls, PA 5.7 mL/hr at 07/11/11 0700 0.3 mcg/kg/min at 07/11/11 0700  . DISCONTD: milrinone (PRIMACOR) infusion 200 mcg/mL (0.2 mg/mL)    Continuous PRN Andree Elk, CRNA 7.1 mL/hr at 07/07/11 1915 0.375 mcg/kg/min at 07/07/11 1915  . DISCONTD: morphine 2 MG/ML injection 1 mg  1 mg Intravenous Q1H PRN Ok Anis, NP      . DISCONTD: morphine 4 MG/ML injection 2-5 mg  2-5 mg Intravenous Q1H PRN Ardelle Balls, PA   1 mg at 07/10/11 1408  . DISCONTD: morphine injection 1 mg  1 mg Intravenous Q1H PRN Ok Anis, NP      . DISCONTD: nitroGLYCERIN (  NITROSTAT) SL tablet 0.4 mg  0.4 mg  Sublingual Q5 Min x 3 PRN Ok Anis, NP      . DISCONTD: nitroGLYCERIN (NITROSTAT) SL tablet 0.4 mg  0.4 mg Sublingual Q5 Min x 3 PRN Ok Anis, NP      . DISCONTD: nitroGLYCERIN 0.2 mg/mL in dextrose 5 % infusion  2-200 mcg/min Intravenous To OR [name], MD      . DISCONTD: nitroGLYCERIN 0.2 mg/mL in dextrose 5 % infusion  2-200 mcg/min Intravenous To OR [name], MD      . DISCONTD: nitroGLYCERIN 0.2 mg/mL in dextrose 5 % infusion    Continuous PRN Andree Elk, CRNA 5 mL/hr at 07/07/11 2011 16.6 mcg/min at 07/07/11 2011  . DISCONTD: nitroGLYCERIN 0.2 mg/mL in dextrose 5 % infusion  0-100 mcg/min Intravenous Continuous Ardelle Balls, PA 5 mL/hr at 07/07/11 2145 16.667 mcg/min at 07/07/11 2145  . DISCONTD: nitroglycerin/verapamil/heparin/sodium bicarbonate solution irrigation for artery spasm   Irrigation To OR [name], MD      . DISCONTD: ondansetron [facility]) injection 4 mg  4 mg Intravenous Q6H PRN Ok Anis, NP      . DISCONTD: ondansetron (ZOFRAN) injection 4 mg  4 mg Intravenous Q6H PRN Ok Anis, NP      . DISCONTD: ondansetron (ZOFRAN) injection 4 mg  4 mg Intravenous Q6H PRN Ardelle Balls, PA   4 mg at 07/16/11 0929  . DISCONTD: oxyCODONE (Oxy IR/ROXICODONE) immediate release tablet 5-10 mg  5-10 mg Oral Q3H PRN Ardelle Balls, PA   5 mg at 07/11/11 0610  . DISCONTD: pantoprazole (PROTONIX) EC tablet 40 mg  40 mg Oral Q1200 Ardelle Balls, PA      . DISCONTD: pantoprazole (PROTONIX) EC tablet 40 mg  40 mg Oral Q1200 [name], MD   40 mg at 07/17/11 1216  . DISCONTD: pantoprazole sodium (PROTONIX) 40 mg/20 mL oral suspension 40 mg  40 mg Per Tube Q1200 [name], MD   40 mg at 07/09/11 1501  . DISCONTD: phenylephrine (NEO-SYNEPHRINE) 0.08 mg/mL in dextrose 5 % 250 mL infusion  20 mg  Continuous PRN Andree Elk, CRNA   50 mcg/min at 07/07/11 1542  . DISCONTD:  phenylephrine (NEO-SYNEPHRINE) 20,000 mcg in dextrose 5 % 250 mL infusion  30-200 mcg/min Intravenous To OR [name], MD      . DISCONTD: phenylephrine (NEO-SYNEPHRINE) 20,000 mcg in dextrose 5 % 250 mL infusion  30-200 mcg/min Intravenous To OR [name], MD      . DISCONTD: phenylephrine (NEO-SYNEPHRINE) 20,000 mcg in dextrose 5 % 250 mL infusion  0-100 mcg/min Intravenous Continuous Ardelle Balls, PA 2.3 mL/hr at 07/10/11 0545 3 mcg/min at 07/10/11 0545  . DISCONTD: potassium chloride injection 80 mEq  80 mEq Other To OR [name], MD      . DISCONTD: potassium chloride injection 80 mEq  80 mEq Other To OR [name], MD      . DISCONTD: protamine injection    PRN Andree Elk, CRNA   180 mg at 07/07/11 1945  . DISCONTD: rocuronium (ZEMURON) injection    PRN Andree Elk, CRNA   50 mg at 07/07/11 1859  . DISCONTD: rosuvastatin (CRESTOR) tablet 40 mg  40 mg Oral Daily Ok Anis, NP      . DISCONTD: rosuvastatin (CRESTOR) tablet 40 mg  40 mg Oral q1800 Ok Anis, NP      .  DISCONTD: sodium chloride 0.9 % injection 10 mL  10 mL Intravenous Q12H [name], MD   10 mL at 07/16/11 2115  . DISCONTD: sodium chloride 0.9 % injection 10 mL  10 mL Intravenous PRN [name], MD   10 mL at 07/08/11 0304  . DISCONTD: sodium chloride 0.9 % injection 10-40 mL  10-40 mL Intracatheter Q12H [name], MD   10 mL at 07/17/11 0907  . DISCONTD: sodium chloride 0.9 % injection 10-40 mL  10-40 mL Intracatheter PRN [name], MD      . DISCONTD: sodium chloride 0.9 % injection 3 mL  3 mL Intravenous Q12H Ok Anis, NP      . DISCONTD: sodium chloride 0.9 % injection 3 mL  3 mL Intravenous PRN Ok Anis, NP      . DISCONTD: sodium chloride 0.9 % injection 3 mL  3 mL Intravenous Q12H Ok Anis, NP      . DISCONTD: sodium chloride 0.9 % injection 3 mL  3 mL Intravenous PRN Ok Anis, NP      . DISCONTD: sodium chloride 0.9 % injection 3 mL  3 mL Intravenous Q12H Ardelle Balls, PA   3 mL at 07/10/11 2200  . DISCONTD: sodium chloride 0.9 % injection 3 mL  3 mL Intravenous PRN Ardelle Balls, PA      . DISCONTD: Surgifoam 1 Gm with 0.9% sodium chloride (4 ml) topical solution    PRN [name], MD      . DISCONTD: traMADol Janean Sark) tablet 50 mg  50 mg Oral TID PRN [name], MD   50 mg at 07/13/11 1610  . DISCONTD: traMADol (ULTRAM) tablet 50 mg  50 mg Oral Q6H PRN [name], MD   50 mg at 07/16/11 2040  . DISCONTD: vancomycin (VANCOCIN) 1,000 mg in sodium chloride 0.9 % 250 mL IVPB  1,000 mg Intravenous Continuous PRN Andree Elk, CRNA   1,000 mg at 07/07/11 1605  . DISCONTD: vancomycin (VANCOCIN) 1,250 mg in sodium chloride 0.9 % 250 mL IVPB  1,250 mg Intravenous To OR [name], MD      . DISCONTD: vancomycin (VANCOCIN) 1,250 mg in sodium chloride 0.9 % 250 mL IVPB  1,250 mg Intravenous To OR [name], MD      . DISCONTD: vancomycin (VANCOCIN) 1,250 mg in sodium chloride 0.9 % 250 mL IVPB  1,250 mg Intravenous To OR [name], MD      . DISCONTD: vecuronium (NORCURON) injection    PRN Andree Elk, CRNA   3 mg at 07/07/11 1640   No current outpatient prescriptions on file as of 07/18/2011.    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;  has hired help with her husband at night and her son and daughter have been switching off care of her husband; son doesn't live in town is just visiting, daughter lives here and doesn't work  Functional Status:  Mobility: Bed Mobility Bed Mobility: Yes Sit to  Supine: 1: +2 Total assist (30%) Sit to Supine - Details (indicate cue type and reason): assist to slowly lower shoulders to bed and elevate legs to bed; pt holding pillow through transfer Sit to Sidelying Left: 1: +2 Total assist;Patient percentage (comment) (pt ~50%)) Sit to Sidelying Left Details (indicate cue type and reason): min cues for sternal precautions Scooting to HOB: 1: +2 Total assist (10%) Transfers Transfers: Yes Sit to Stand: 4: Min assist;From chair/3-in-1 Sit to Stand Details (indicate cue type and reason): min verbal cues for sternal precautions Stand to Sit: To chair/3-in-1 Stand to Sit Details: min verbal cues for sternal precautions Ambulation/Gait Ambulation/Gait: Yes Ambulation/Gait Assistance: 4: Min assist Ambulation/Gait Assistance Details (indicate cue type and reason): amb approx 60 ft with RW; cues for forward gaze and upright posture; pt with very slow gaurded gait Ambulation Distance (Feet): 60 Feet Assistive device: Rolling walker Gait Pattern: Trunk flexed;Shuffle    ADL: ADL Eating/Feeding: Performed;Independent Where Assessed - Eating/Feeding: Chair Grooming: Performed;Teeth care;Set up Grooming Details (indicate cue type and reason): fatigues quickly with UE use Where Assessed - Grooming: Sitting, chair Upper Body Bathing: Simulated;Moderate assistance Where Assessed - Upper Body Bathing: Sitting, chair Lower Body Bathing: Maximal assistance;Simulated Lower Body Bathing Details (indicate cue type and reason): can cross foot over opposite knee, but not able to access foot Where Assessed - Lower Body Bathing: Sitting, chair;Sit to stand from chair Upper Body Dressing: Simulated;Moderate  assistance Where Assessed - Upper Body Dressing: Sitting, chair Lower Body Dressing: Performed;Maximal assistance Lower Body Dressing Details (indicate cue type and reason): able to cross foot over opposite knee, but not access foot Where Assessed - Lower Body Dressing: Sitting, chair;Sit to stand from bed Toilet Transfer: Performed;+2 Total assistance;Other (comment) (pt 70%) Toilet Transfer Details (indicate cue type and reason): Pt. ambulated to BR with total A +2 HHA (pt ~70%) Toilet Transfer Method: Proofreader: Comfort height toilet Toileting - Clothing Manipulation: Maximal assistance;Performed Where Assessed - Glass blower/designer Manipulation: Standing Toileting - Hygiene: Performed;Set up Where Assessed - Toileting Hygiene: Sit on 3-in-1 or toilet Ambulation Related to ADLs: Pt. ambulated to the BR with +2 total A/HHA (pt ~70%).  Pt. requires min verbal cues for sternal precautions ADL Comments: Pt. sitting up in chair finishing up brushing teeth.  Pt. reports fatigue.  Pt. ambulated to BR.  Attempted grooming at sink, but pt. too fatigued.  Pt. ambulated back to bed.  Cognition: Cognition Arousal/Alertness: Awake/alert Orientation Level: Oriented X4 Cognition Arousal/Alertness: Awake/alert Overall Cognitive Status: Appears within functional limits for tasks assessed Orientation Level: Oriented X4   Blood pressure 93/40, pulse 77, temperature 97.3 F (36.3 C), temperature source Oral, resp. rate 26, height 5\' 4"  (1.626 m), weight 65.4 kg (144 lb 2.9 oz), SpO2 100.00%. Physical Exam  Nursing note and vitals reviewed.  Constitutional: She is oriented to person, place, and time. She appears well-developed and well-nourished.  HENT:  Head: Normocephalic and atraumatic.  Eyes: Pupils are equal, round, and reactive to light. eomi Neck: Normal range of motion. Neck supple.  Cardiovascular: Normal rate and regular rhythm.  Pulmonary/Chest: Effort normal.  She has wheezes. She exhibits tenderness.  Abdominal: Soft. Bowel sounds are normal.  Musculoskeletal: Normal range of motion.  Neurological: She is alert and oriented to person, place, and time.  Skin: Skin is warm and dry. Chest incision is clean and dry. Some redness along sacrum, bruising along left leg incision with healing wound. Psychiatric: She has a normal mood and affect but is  fatigued. Her behavior is normal. Thought content normal.  motor strength is 4/5 in bilateral deltoid, biceps, triceps, and grip as well as hip flexion, knee extension, ankle dorsiflexion.  Sensation is intact to light touch in bilateral upper and lower extremities, dtr's 1+, no gross cognitive deficits. Cn exam grossly intact Mood and affect are appropriate  General appears tired but otherwise no acute distress   Results for orders placed during the hospital encounter of 07/07/11 (from the past 48 hour(s))  GLUCOSE, CAPILLARY     Status: Abnormal   Collection Time   07/16/11 11:41 AM      Component Value Range Comment   Glucose-Capillary 130 (*) 70 - 99 (mg/dL)    Comment 1 Notify RN     GLUCOSE, CAPILLARY     Status: Abnormal   Collection Time   07/16/11  5:17 PM      Component Value Range Comment   Glucose-Capillary 184 (*) 70 - 99 (mg/dL)    Comment 1 Notify RN     GLUCOSE, CAPILLARY     Status: Normal   Collection Time   07/16/11  9:16 PM      Component Value Range Comment   Glucose-Capillary 93  70 - 99 (mg/dL)   BASIC METABOLIC PANEL     Status: Abnormal   Collection Time   07/17/11  3:30 AM      Component Value Range Comment   Sodium 132 (*) 135 - 145 (mEq/L)    Potassium 3.7  3.5 - 5.1 (mEq/L)    Chloride 96  96 - 112 (mEq/L)    CO2 32  19 - 32 (mEq/L)    Glucose, Bld 112 (*) 70 - 99 (mg/dL)    BUN 20  6 - 23 (mg/dL)    Creatinine, Ser 1.61  0.50 - 1.10 (mg/dL)    Calcium 8.4  8.4 - 10.5 (mg/dL)    GFR calc non Af Amer 49 (*) >90 (mL/min)    GFR calc Af Amer 56 (*) >90 (mL/min)   CBC      Status: Abnormal   Collection Time   07/17/11  3:30 AM      Component Value Range Comment   WBC 16.9 (*) 4.0 - 10.5 (K/uL)    RBC 3.14 (*) 3.87 - 5.11 (MIL/uL)    Hemoglobin 9.1 (*) 12.0 - 15.0 (g/dL)    HCT 09.6 (*) 04.5 - 46.0 (%)    MCV 90.1  78.0 - 100.0 (fL)    MCH 29.0  26.0 - 34.0 (pg)    MCHC 32.2  30.0 - 36.0 (g/dL)    RDW 40.9 (*) 81.1 - 15.5 (%)    Platelets 382  150 - 400 (K/uL)   GLUCOSE, CAPILLARY     Status: Abnormal   Collection Time   07/17/11  7:45 AM      Component Value Range Comment   Glucose-Capillary 112 (*) 70 - 99 (mg/dL)    Comment 1 Documented in Chart      Comment 2 Notify RN     GLUCOSE, CAPILLARY     Status: Abnormal   Collection Time   07/17/11 12:05 PM      Component Value Range Comment   Glucose-Capillary 162 (*) 70 - 99 (mg/dL)    Comment 1 Documented in Chart      Comment 2 Notify RN     GLUCOSE, CAPILLARY     Status: Abnormal   Collection Time   07/17/11  4:44 PM  Component Value Range Comment   Glucose-Capillary 168 (*) 70 - 99 (mg/dL)   GLUCOSE, CAPILLARY     Status: Abnormal   Collection Time   07/17/11  7:31 PM      Component Value Range Comment   Glucose-Capillary 169 (*) 70 - 99 (mg/dL)    Comment 1 Documented in Chart      Comment 2 Notify RN     CBC     Status: Abnormal   Collection Time   07/18/11  4:45 AM      Component Value Range Comment   WBC 18.6 (*) 4.0 - 10.5 (K/uL)    RBC 3.25 (*) 3.87 - 5.11 (MIL/uL)    Hemoglobin 9.6 (*) 12.0 - 15.0 (g/dL)    HCT 29.5 (*) 62.1 - 46.0 (%)    MCV 89.8  78.0 - 100.0 (fL)    MCH 29.5  26.0 - 34.0 (pg)    MCHC 32.9  30.0 - 36.0 (g/dL)    RDW 30.8 (*) 65.7 - 15.5 (%)    Platelets 444 (*) 150 - 400 (K/uL)   BASIC METABOLIC PANEL     Status: Abnormal   Collection Time   07/18/11  4:45 AM      Component Value Range Comment   Sodium 134 (*) 135 - 145 (mEq/L)    Potassium 4.1  3.5 - 5.1 (mEq/L)    Chloride 98  96 - 112 (mEq/L)    CO2 29  19 - 32 (mEq/L)    Glucose, Bld 148 (*) 70  - 99 (mg/dL)    BUN 21  6 - 23 (mg/dL)    Creatinine, Ser 8.46  0.50 - 1.10 (mg/dL)    Calcium 8.8  8.4 - 10.5 (mg/dL)    GFR calc non Af Amer 47 (*) >90 (mL/min)    GFR calc Af Amer 54 (*) >90 (mL/min)    Dg Chest 2 View  07/17/2011  *RADIOLOGY REPORT*  Clinical Data: Post CABG.  Weakness, shortness of breath and cough.  CHEST - 2 VIEW  Comparison: 07/15/2011.  Findings: Trachea is midline.  Heart size stable.  Sternotomy wires are unchanged in position.  Right PICC tip projects over the SVC/RA junction.  There is central pulmonary vascular congestion with small bilateral pleural effusions and bibasilar air space disease.  Findings appear relatively stable from 07/15/2011.  IMPRESSION: Central pulmonary vascular congestion with small bilateral pleural effusions and bibasilar air space disease, stable.  Original Report Authenticated By: Reyes Ivan, M.D.    Post Admission Physician Evaluation: 1. Functional deficits secondary  to MI/CABG/VSD repair/ thymoma. 2. Patient is admitted to receive collaborative, interdisciplinary care between the physiatrist, rehab nursing staff, and therapy team. 3. Patient's level of medical complexity and substantial therapy needs in context of that medical necessity cannot be provided at a lesser intensity of care such as a SNF. 4. Patient has experienced substantial functional loss from his/her baseline which was documented above under the "Functional History" and "Functional Status" headings.  Judging by the patient's diagnosis, physical exam, and functional history, the patient has potential for functional progress which will result in measurable gains while on inpatient rehab.  These gains will be of substantial and practical use upon discharge  in facilitating mobility and self-care at the household level. 5. Physiatrist will provide 24 hour management of medical needs as well as oversight of the therapy plan/treatment and provide guidance as appropriate  regarding the interaction of the two. 6. 24 hour rehab nursing will  assist with bladder management, bowel management, safety, skin/wound care, disease management, medication administration, pain management and patient education  and help integrate therapy concepts, techniques,education, etc. 7. PT will assess and treat for:  Lower ext strength, rom, activity tolerance, gait, stamina.  Goals are: supervision to mod I. 8. OT will assess and treat for: UES, ADL's, fxnl mobility, AET, safety.   Goals are: supervision to mod I. 9. SLP will assess and treat for:  10. Case Management and Social Worker will assess and treat for psychological issues and discharge planning. 11. Team conference will be held weekly to assess progress toward goals and to determine barriers to discharge. 12.  Patient will receive at least 3 hours of therapy per day at least 5 days per week. 13. ELOS and Prognosis: 1-2 weeks excellent   Medical Problem List and Plan: 1. deconditioning following myocardial infarction. 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 Monitor for deep vein thrombosis 3.. Pain Management: Ultram and tylenol as needed. Monitor with increased mobility 4. Post operative anemia. Patient has been transfused. Followup CBC in a.m. 5. Hypertension. Coreg 6.25 mg twice daily, Lasix 40 mg daily, Altace 2.5 mg daily. Monitor with increased activity 6. Hyperlipidemia. Crestor 7. History of bilateral total knee replacements. 8. FEN: nutritional intake poor. Consider appetite stimulant, RD f/u  Ivory Broad, MD 07/18/2011, 1630

## 2011-07-18 NOTE — Progress Notes (Signed)
Nutrition Follow-up  Pt states she is eating better; drinking Ensure Clinical Strength supplements TID -- likes chocolate flavor. PO intake 25-50% per flowsheet records. Noted discharging to inpatient Rehab.  Diet Order:  Carbohydrate Modified Medium Calorie  Meds: Scheduled Meds:   . aspirin EC  325 mg Oral Daily  . carvedilol  6.25 mg Oral BID WC  . docusate sodium  200 mg Oral Daily  . feeding supplement  237 mL Oral TID WC  . furosemide  40 mg Oral Daily  . moving right along book   Does not apply Once  . pantoprazole  40 mg Oral Q1200  . potassium chloride  20 mEq Oral Daily  . ramipril  2.5 mg Oral Daily  . rosuvastatin  5 mg Oral q1800  . sodium chloride  3 mL Intravenous Q12H  . DISCONTD: aspirin  324 mg Per Tube Daily  . DISCONTD: aspirin EC  325 mg Oral Daily  . DISCONTD: bisacodyl  10 mg Oral Daily  . DISCONTD: bisacodyl  10 mg Rectal Daily  . DISCONTD: docusate sodium  200 mg Oral Daily  . DISCONTD: insulin aspart  0-15 Units Subcutaneous TID WC  . DISCONTD: insulin aspart  0-5 Units Subcutaneous QHS  . DISCONTD: metoprolol tartrate  12.5 mg Per Tube BID  . DISCONTD: metoprolol tartrate  12.5 mg Oral BID  . DISCONTD: pantoprazole  40 mg Oral Q1200  . DISCONTD: sodium chloride  10 mL Intravenous Q12H  . DISCONTD: sodium chloride  10-40 mL Intracatheter Q12H   Continuous Infusions:   . DISCONTD: sodium chloride 20 mL/hr at 07/15/11 1500  . DISCONTD: sodium chloride 20 mL/hr at 07/14/11 2000  . DISCONTD: sodium chloride 250 mL (07/13/11 0430)  . DISCONTD: lactated ringers 10 mL/hr at 07/11/11 0700   PRN Meds:.sodium chloride, acetaminophen, bisacodyl, bisacodyl, diphenhydrAMINE, ondansetron (ZOFRAN) IV, ondansetron, sodium chloride, DISCONTD: diphenhydrAMINE, DISCONTD: metoprolol, DISCONTD: ondansetron (ZOFRAN) IV, DISCONTD: sodium chloride, DISCONTD: sodium chloride, DISCONTD: traMADol  Labs:  CMP     Component Value Date/Time   NA 134* 07/18/2011 0445   K 4.1  07/18/2011 0445   CL 98 07/18/2011 0445   CO2 29 07/18/2011 0445   GLUCOSE 148* 07/18/2011 0445   BUN 21 07/18/2011 0445   CREATININE 1.06 07/18/2011 0445   CALCIUM 8.8 07/18/2011 0445   PROT 4.1* 07/10/2011 0416   ALBUMIN 2.0* 07/10/2011 0416   AST 140* 07/10/2011 0416   ALT 254* 07/10/2011 0416   ALKPHOS 43 07/10/2011 0416   BILITOT 0.5 07/10/2011 0416   GFRNONAA 47* 07/18/2011 0445   GFRAA 54* 07/18/2011 0445     Intake/Output Summary (Last 24 hours) at 07/18/11 1146 Last data filed at 07/18/11 0900  Gross per 24 hour  Intake    977 ml  Output   1076 ml  Net    -99 ml    CBG (last 3)   Basename 07/17/11 1931 07/17/11 1644 07/17/11 1205  GLUCAP 169* 168* 162*    Weight Status:  65.4 kg (2/12) -- trending down  Re-estimated needs:  1500-1700 kcals, 80-90 gm protein  Nutrition Dx:  Inadequate Oral Intake, improving  Goal:  PO diet & supplemental intake to meet >90% of estimated nutrition needs, met Monitor: PO intake, labs, weight, I/O's  Intervention:    Continue Ensure Clinical Strength PO TID  RD to follow for nutrition care plan   Alger Memos Pager #:  [phone]

## 2011-07-18 NOTE — Progress Notes (Signed)
PT Cancellation Note  Treatment cancelled today due to pt plan to d/c CIR today. Will defer further therapies to CIR. Humberto Seals HELEN 07/18/2011, 10:51 AM

## 2011-07-18 NOTE — Discharge Summary (Signed)
[address]            [place] [address]          [phone]         Discharge Summary  Name: [name] DOB: 1925-01-20 78 y.o. MRN: [id number]  Admission Date: 07/07/2011 Discharge Date:    Admitting Diagnosis: Chest pain  Discharge Diagnosis:  Acute anterior myocardial infarction Postinfarction ventricular septal defect Thymoma Hypertension Osteoarthritis of the knees, status post bilateral knee replacements Postop acute blood loss anemia  Procedures: Procedure(s): EMERGENCY CORONARY ARTERY BYPASS GRAFTING (CABG) x 2 (sequential saphenous vein graft to the LAD and first diagonal), endoscopic vein harvest left thigh VENTRICULAR SEPTAL DEFECT (VSD) REPAIR EXCISION OF MEDIASTINAL MASS on 07/07/2011   HPI:  The patient is a 78 y.o. female with a history of hypertension who presented to the ER on the date of this admission with acute onset chest pain. She had been in her usual state of health until 3 weeks ago, when she developed substernal chest pain both at rest and with exertion, and associated with dyspnea. On the evening prior to admission, the patient developed acute onset chest pain with associated nausea, vomiting, and dyspnea. The pain persisted throughout the night, and radiated to the left upper back. She was seen the following morning in her primary care physician's office, where an EKG showed anterolateral ST elevation. The patient was brought to the ER, and a Code STEMI was called. She was taken directly to the cath lab for catheterization by Dr. Eden Emms, which showed 100% occlusion of the LAD, 80% proximal D1, normal circumflex, 40% distal RCA and 60% PDA. EF was 35%. There was a large post-infarction VSD. She was admitted to the CCU for further evaluation and treatment.       Hospital Course:  The patient was admitted to [facility] on 07/07/2011. A cardiac surgery consult was obtained to evaluate for emergency CABG/VSD  repair. Dr. Tyrone Sage agreed with the need for surgery.All risks, benefits and alternatives of surgery were explained in detail, and the patient agreed to proceed. The patient was taken to the operating room and underwent the above procedure.  Intraoperatively, an anterior mediastinal mass was noted upon opening the chest cavity. This was resected and submitted to pathology.  The postoperative course has been relatively uneventful. She remained on the ventilator until postop day 3 and was extubated without problem. Her intra-aortic balloon pump was removed without difficulty and her cardiac status has remained stable. She required a transfusion of packed red blood cells for a postop blood loss anemia. She has been weak and deconditioned and has been working with physical therapy. She is volume overloaded and was started on Lasix which she is responding well. She is maintaining sinus rhythm, and has been started on a beta blocker, an ACE inhibitor, and a statin for preop MI. Her intraoperative pathology revealed a malignant thymoma which will require outpatient followup with oncology. Postoperative echocardiogram was performed which showed a small residual ventricular septal defect. She is tolerating a regular diet and receiving nutritional supplements. She continues to progress slowly but steadily with mobility. She has remained afebrile and vital signs have been stable. She has had a leukocytosis without fever or signs of infection.  This is being monitored and treated conservatively. She has been working with physical therapy and occupational therapy on reconditioning and it is felt that  she will require a brief stay in inpatient rehabilitation prior to discharge home. She currently is medically stable and ready for transfer.   Recent vital signs:  Filed Vitals:   07/18/11 1000  BP: 93/40  Pulse: 77  Temp:   Resp: 26    Recent laboratory studies:  CBC: Basename 07/18/11 0445 07/17/11 0330  WBC  18.6* 16.9*  HGB 9.6* 9.1*  HCT 29.2* 28.3*  PLT 444* 382   BMET:  Basename 07/18/11 0445 07/17/11 0330  NA 134* 132*  K 4.1 3.7  CL 98 96  CO2 29 32  GLUCOSE 148* 112*  BUN 21 20  CREATININE 1.06 1.03  CALCIUM 8.8 8.4    PT/INR: No results found for this basename: LABPROT,INR in the last 72 hours  Discharge Medications:   Medication List  As of 07/18/2011 10:59 AM   STOP taking these medications         ADVIL PM 200-25 MG Caps      hydrochlorothiazide 12.5 MG capsule         TAKE these medications         acetaminophen 325 MG tablet   Commonly known as: TYLENOL   Take 2 tablets (650 mg total) by mouth every 6 (six) hours as needed.      aspirin 325 MG EC tablet   Take 1 tablet (325 mg total) by mouth daily.      carvedilol 6.25 MG tablet   Commonly known as: COREG   Take 1 tablet (6.25 mg total) by mouth 2 (two) times daily with a meal.      diphenhydrAMINE 25 mg capsule   Commonly known as: BENADRYL   Take 1 capsule (25 mg total) by mouth at bedtime as needed for itching or sleep.      DSS 100 MG Caps   Take 200 mg by mouth daily.      feeding supplement Liqd   Take 237 mLs by mouth 3 (three) times daily with meals.      furosemide 40 MG tablet   Commonly known as: LASIX   Take 1 tablet (40 mg total) by mouth daily.      pantoprazole 40 MG tablet   Commonly known as: PROTONIX   Take 1 tablet (40 mg total) by mouth daily at 12 noon.      potassium chloride SA 20 MEQ tablet   Commonly known as: K-DUR,KLOR-CON   Take 1 tablet (20 mEq total) by mouth daily.      ramipril 2.5 MG capsule   Commonly known as: ALTACE   Take 1 capsule (2.5 mg total) by mouth daily.      rosuvastatin 5 MG tablet   Commonly known as: CRESTOR   Take 1 tablet (5 mg total) by mouth daily at 6 PM.            Discharge Instructions:  The patient is to refrain from driving, heavy lifting or strenuous activity.  May shower daily and clean incisions with soap and water.  May  resume regular diet.  Discharge Orders    Future Appointments: Provider: Department: Dept Phone: Center:   08/07/2011 3:00 PM [facility] Nurse [facility] [phone] None   08/07/2011 3:30 PM Jonna Coup, MD [facility] [phone] None     Outpatient follow up appointments should be made with Dr. Tyrone Sage and Dr. Eden Emms upon discharge from rehab.    Antario Yasuda H 07/18/2011, 10:59 AM

## 2011-07-18 NOTE — Progress Notes (Addendum)
Patient ID: [name], female   DOB: Jun 06, 1924, 78 y.o.   MRN: [id number] @ Subjective:  Denies SSCP, palpitations or Dyspnea Weak.      Objective:  Filed Vitals:   07/18/11 0600 07/18/11 0700 07/18/11 0753 07/18/11 0800  BP: 113/50 117/50  112/39  Pulse:  85  85  Temp:   97.3 F (36.3 C)   TempSrc:   Oral   Resp: 25 27  23   Height:      Weight:      SpO2: 98% 100%  98%    Intake/Output from previous day:  Intake/Output Summary (Last 24 hours) at 07/18/11 0808 Last data filed at 07/18/11 0700  Gross per 24 hour  Intake   1030 ml  Output   1426 ml  Net   -396 ml    Physical Exam: Chronically ill elderly female Less  tachypnic Rales at base SEM thoughout precordium No edema Neuro nonfocal  Lab Results: Basic Metabolic Panel:  Basename 07/18/11 0445 07/17/11 0330  NA 134* 132*  K 4.1 3.7  CL 98 96  CO2 29 32  GLUCOSE 148* 112*  BUN 21 20  CREATININE 1.06 1.03  CALCIUM 8.8 8.4  MG -- --  PHOS -- --   CBC:  Basename 07/18/11 0445 07/17/11 0330  WBC 18.6* 16.9*  NEUTROABS -- --  HGB 9.6* 9.1*  HCT 29.2* 28.3*  MCV 89.8 90.1  PLT 444* 382    Imaging: Dg Chest 2 View  07/17/2011  *RADIOLOGY REPORT*  Clinical Data: Post CABG.  Weakness, shortness of breath and cough.  CHEST - 2 VIEW  Comparison: 07/15/2011.  Findings: Trachea is midline.  Heart size stable.  Sternotomy wires are unchanged in position.  Right PICC tip projects over the SVC/RA junction.  There is central pulmonary vascular congestion with small bilateral pleural effusions and bibasilar air space disease.  Findings appear relatively stable from 07/15/2011.  IMPRESSION: Central pulmonary vascular congestion with small bilateral pleural effusions and bibasilar air space disease, stable.  Original Report Authenticated By: Reyes Ivan, M.D.    Cardiac Studies:  ECG:  SR anterior MI   Telemetry:  NSR no afib  Echo: Preop EF 35% apical VSD Echo today EF 35% no mural apical clot, no  effusion small residual VSD  Medications:      . aspirin EC  325 mg Oral Daily  . carvedilol  6.25 mg Oral BID WC  . docusate sodium  200 mg Oral Daily  . feeding supplement  237 mL Oral TID WC  . furosemide  40 mg Oral Daily  . moving right along book   Does not apply Once  . pantoprazole  40 mg Oral Q1200  . potassium chloride      . potassium chloride  20 mEq Oral Daily  . ramipril  2.5 mg Oral Daily  . rosuvastatin  5 mg Oral q1800  . sodium chloride  3 mL Intravenous Q12H  . DISCONTD: aspirin  324 mg Per Tube Daily  . DISCONTD: aspirin EC  325 mg Oral Daily  . DISCONTD: bisacodyl  10 mg Oral Daily  . DISCONTD: bisacodyl  10 mg Rectal Daily  . DISCONTD: docusate sodium  200 mg Oral Daily  . DISCONTD: insulin aspart  0-15 Units Subcutaneous TID WC  . DISCONTD: insulin aspart  0-5 Units Subcutaneous QHS  . DISCONTD: metoprolol tartrate  12.5 mg Per Tube BID  . DISCONTD: metoprolol tartrate  12.5 mg Oral BID  . DISCONTD:  pantoprazole  40 mg Oral Q1200  . DISCONTD: sodium chloride  10 mL Intravenous Q12H  . DISCONTD: sodium chloride  10-40 mL Intracatheter Q12H        . DISCONTD: sodium chloride 20 mL/hr at 07/15/11 1500  . DISCONTD: sodium chloride 20 mL/hr at 07/14/11 2000  . DISCONTD: sodium chloride 250 mL (07/13/11 0430)  . DISCONTD: lactated ringers 10 mL/hr at 07/11/11 0700    Assessment/Plan:  CABG/VSD: doing amazingly well due to excellent surgical care.  Small residual VSD by echo Enalapril started Monday Thymoma:  ? 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  F/U radiation oncology  Anemia:  Improving no evidence of ongoing blood loss in mediatinum or lungs  [name] 07/18/2011, 8:08 AM

## 2011-07-18 NOTE — PMR Pre-admission (Signed)
PMR Admission Coordinator Pre-Admission Assessment  Patient:  [name] is an 78 y.o., female MRN:  [id number] DOB:  [birth year] Height:  5\' 4"  (162.6 cm) Weight:  65.4 kg (144 lb 2.9 oz)  Insurance Information: HMO:yes   PPO:      PCP:      IPA:      80/20:      OTHER:Medicare Advantage plan PRIMARY:Blue Medicare      Policy#:[id number]      Subscriber:Avayah Signor CM Name:Shannon Thompson-Lamott      Phone#:[phone]     QMV#:[phone] Pre-Cert#:                                                  Employer:Retired Benefits:  Phone #:[phone]     Name:Erin J. Eff. Date:06-06-11     Deduct:0  Out of Pocket Max:$3400 (met $13.56)   Life WNU:UVOZ CIR:w/precert $195 days 1-6      SNF:w/precert $0 1-10 days, $50 days 11-100 days Outpatient: w/med necessity   Co-Pay:$35/visit Home Health:100%      Co-Pay:none DME:80% w/auth >$600     Co-Pay:20% Providers:in network  Current Medical History:   Patient Admitting Diagnosis:  Deconditioned History of Present Illness: Admitted 02/01 after 3 week history of rest and exertional dyspnea.  Had sudden onset of chest pain with nausea, vomiting and dyspnea the night prior to admission with anterior STEMI.  Patient taken to cath lab emergently by Dr. Eden Emms.  Had large anterior MI with mechanical complication of VSD.  Dr. Tyrone Sage consulted and patient taken to OR same day for CABG x 2, VSD repair and excision of mediastinal mass.  Post op ABLA, transfused.  Extubated 02/04.  Pathology with malignant thymoma and Dr. Cyndie Chime following.  Patients Past Medical History:   Past Medical History  Diagnosis Date  . Acute MI, anterior wall     a. 07/07/2011  . HTN (hypertension)   . Osteoarthritis of knee     Bilateral.  a. s/p LTKA ~2007;  b. s/p RTKA 10/2010  . CAD (coronary artery disease)     a. 07/07/2011 - Occluded LAD  . VSD (ventricular septal defect)     a.  07/07/2011 - Noted @ time of MI   Family Medical History:  family history is not on  file. Patients Current Diet: Carb Control  Prior Rehab/Hospitalizations: Went to [facility] last spring for 5 weeks following TKR.  Current Medications: Current facility-administered medications:0.9 %  sodium chloride infusion, 250 mL, Intravenous, PRN, [name], MD;  acetaminophen (TYLENOL) tablet 650 mg, 650 mg, Oral, Q6H PRN, [name], MD;  aspirin EC tablet 325 mg, 325 mg, Oral, Daily, [name], MD, 325 mg at 07/18/11 0934;  bisacodyl (DULCOLAX) EC tablet 10 mg, 10 mg, Oral, Daily PRN, [name], MD bisacodyl (DULCOLAX) suppository 10 mg, 10 mg, Rectal, Daily PRN, [name], MD;  carvedilol (COREG) tablet 6.25 mg, 6.25 mg, Oral, BID WC, [name], MD, 6.25 mg at 07/18/11 3664;  diphenhydrAMINE (BENADRYL) capsule 25 mg, 25 mg, Oral, QHS PRN, Purcell Nails, MD, 25 mg at 07/17/11 2205;  docusate sodium (COLACE) capsule 200 mg, 200 mg, Oral, Daily, [name], MD feeding supplement (ENSURE CLINICAL STRENGTH) liquid 237 mL, 237 mL, Oral, TID WC, [name], MD, 237 mL at 07/18/11 1145;  furosemide (LASIX) tablet 40 mg, 40 mg, Oral, Daily, [name], MD, 40 mg at 07/18/11 0935;  moving right along book, , Does not apply, Once, [name], MD;  ondansetron [facility]) injection 4 mg, 4 mg, Intravenous, Q6H PRN, [name], MD ondansetron [facility]) tablet 4 mg, 4 mg, Oral, Q6H PRN, [name], MD;  pantoprazole (PROTONIX) EC tablet 40 mg, 40 mg, Oral, Q1200, [name], MD, 40 mg at 07/18/11 1145;  potassium chloride SA (K-DUR,KLOR-CON) CR tablet 20 mEq, 20 mEq, Oral, Daily, [name], MD, 20 mEq at 07/18/11 0935;  ramipril (ALTACE) capsule 2.5 mg, 2.5 mg, Oral, Daily, [name], MD rosuvastatin (CRESTOR) tablet 5 mg, 5 mg, Oral, q1800, [name], MD, 5 mg at 07/17/11 1755;  sodium chloride 0.9 % injection 3 mL, 3 mL, Intravenous, Q12H, [name], MD, 3 mL at 07/18/11 0935;   sodium chloride 0.9 % injection 3 mL, 3 mL, Intravenous, PRN, [name], MD;  DISCONTD: 0.45 % sodium chloride infusion, , Intravenous, Continuous, Ardelle Balls, PA, Last Rate: 20 mL/hr at 07/15/11 1500 DISCONTD: 0.9 %  sodium chloride infusion, , Intravenous, Continuous, Ardelle Balls, PA, Last Rate: 20 mL/hr at 07/14/11 2000;  DISCONTD: 0.9 %  sodium chloride infusion, 250 mL, Intravenous, Continuous, Ardelle Balls, PA, Last Rate: 1 mL/hr at 07/13/11 0430, 250 mL at 07/13/11 0430;  DISCONTD: aspirin chewable tablet 324 mg, 324 mg, Per Tube, Daily, Ardelle Balls, PA, 324 mg at 07/10/11 1005 DISCONTD: aspirin EC tablet 325 mg, 325 mg, Oral, Daily, Ardelle Balls, PA, 325 mg at 07/17/11 0930;  DISCONTD: bisacodyl (DULCOLAX) EC tablet 10 mg, 10 mg, Oral, Daily, Ardelle Balls, PA, 10 mg at 07/16/11 1059;  DISCONTD: bisacodyl (DULCOLAX) suppository 10 mg, 10 mg, Rectal, Daily, Ardelle Balls, PA, 10 mg at 07/10/11 1005 DISCONTD: diphenhydrAMINE (BENADRYL) capsule 25 mg, 25 mg, Oral, QHS PRN, Kathlee Nations Trigt III, MD, 25 mg at 07/16/11 2116;  DISCONTD: docusate sodium (COLACE) capsule 200 mg, 200 mg, Oral, Daily, [name], MD, 200 mg at 07/16/11 1059;  DISCONTD: insulin aspart (novoLOG) injection 0-15 Units, 0-15 Units, Subcutaneous, TID WC, Kerin Perna III, MD, 3 Units at 07/17/11 1216 DISCONTD: insulin aspart (novoLOG) injection 0-5 Units, 0-5 Units, Subcutaneous, QHS, Kathlee Nations Trigt III, MD;  DISCONTD: lactated ringers infusion, , Intravenous, Continuous, Ardelle Balls, PA, Last Rate: 10 mL/hr at 07/11/11 0700;  DISCONTD: metoprolol (LOPRESSOR) injection 2.5-5 mg, 2.5-5 mg, Intravenous, Q2H PRN, Ardelle Balls, PA DISCONTD: metoprolol tartrate (LOPRESSOR) 25 mg/10 mL oral suspension 12.5 mg, 12.5 mg, Per Tube, BID, Ardelle Balls, PA;  DISCONTD: metoprolol tartrate (LOPRESSOR) tablet 12.5 mg, 12.5 mg, Oral, BID, Ardelle Balls, PA, 12.5 mg at 07/17/11 0930;  DISCONTD: ondansetron (ZOFRAN) injection 4 mg, 4 mg, Intravenous, Q6H PRN, Ardelle Balls, PA, 4 mg at 07/16/11 1610 DISCONTD: pantoprazole (PROTONIX) EC tablet 40 mg, 40 mg, Oral, Q1200, [name], MD, 40 mg at 07/17/11 1216;  DISCONTD: sodium chloride 0.9 % injection 10 mL, 10 mL, Intravenous, Q12H, [name], MD, 10 mL at 07/16/11 2115;  DISCONTD: sodium chloride 0.9 % injection 10 mL, 10 mL, Intravenous, PRN, [name], MD, 10 mL at 07/08/11 0304 DISCONTD: sodium chloride 0.9 % injection 10-40 mL, 10-40 mL, Intracatheter, Q12H, [name], MD, 10 mL at 07/17/11 0907;  DISCONTD: sodium chloride 0.9 % injection 10-40 mL, 10-40 mL, Intracatheter,  PRN, [name], MD;  DISCONTD: traMADol Janean Sark) tablet 50 mg, 50 mg, Oral, Q6H PRN, [name], MD, 50 mg at 07/16/11 2040  Additional Precautions/Restrictions: Precautions Precautions: Sternal Restrictions Weight Bearing Restrictions: No  Therapy Assessments Physical Therapy: Precautions Precautions: Sternal Home Living Lives With: Spouse;Other (Comment) (Husband has dementia ) Receives Help From: Family;Other (Comment) (has assist for personal care attendant for husband) Type of Home: House Home Layout: One level Home Adaptive Equipment: Walker - rolling Prior Function Level of Independence: Independent with basic ADLs;Independent with homemaking with ambulation;Independent with homemaking with wheelchair;Independent with gait;Independent with transfers Driving: Yes Vocation: Retired Comments: 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 and needs physical help at home; has hired help with her husband at night and her son and daughter have been switching off care of her husband; son doesn't live in town is just visiting, daughter lives here and doesn't work Educational psychologist Movements are Fluid and Coordinated: Yes Fine Motor Movements are  Fluid and Coordinated: Yes (arthritis in B hands)  Occupational Therapy: Precautions Precautions: Sternal Home Living Lives With: Spouse;Other (Comment) (Husband has dementia ) Receives Help From: Family;Other (Comment) (has assist for personal care attendant for husband) Type of Home: House Home Layout: One level Home Adaptive Equipment: Walker - rolling Prior Function Level of Independence: Independent with basic ADLs;Independent with homemaking with ambulation;Independent with homemaking with wheelchair;Independent with gait;Independent with transfers Driving: Yes Vocation: Retired Comments: 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 and needs physical help at home; has hired help with her husband at night and her son and daughter have been switching off care of her husband; son doesn't live in town is just visiting, daughter lives here and doesn't work Educational psychologist Movements are Fluid and Coordinated: Yes Fine Motor Movements are Fluid and Coordinated: Yes (arthritis in B hands) Restrictions Weight Bearing Restrictions: No ADL Eating/Feeding: Performed;Independent Where Assessed - Eating/Feeding: Chair Grooming: Performed;Teeth care;Set up Grooming Details (indicate cue type and reason): fatigues quickly with UE use Where Assessed - Grooming: Sitting, chair Upper Body Bathing: Simulated;Moderate assistance Where Assessed - Upper Body Bathing: Sitting, chair Lower Body Bathing: Maximal assistance;Simulated Lower Body Bathing Details (indicate cue type and reason): can cross foot over opposite knee, but not able to access foot Where Assessed - Lower Body Bathing: Sitting, chair;Sit to stand from chair Upper Body Dressing: Simulated;Moderate assistance Where Assessed - Upper Body Dressing: Sitting, chair Lower Body Dressing: Performed;Maximal assistance Lower Body Dressing Details (indicate cue type and reason): able to cross foot over opposite knee, but not  access foot Where Assessed - Lower Body Dressing: Sitting, chair;Sit to stand from bed Toilet Transfer: Performed;+2 Total assistance;Other (comment) (pt 70%) Toilet Transfer Details (indicate cue type and reason): Pt. ambulated to BR with total A +2 HHA (pt ~70%) Toilet Transfer Method: Proofreader: Comfort height toilet Toileting - Clothing Manipulation: Maximal assistance;Performed Where Assessed - Glass blower/designer Manipulation: Standing Toileting - Hygiene: Performed;Set up Where Assessed - Toileting Hygiene: Sit on 3-in-1 or toilet Ambulation Related to ADLs: Pt. ambulated to the BR with +2 total A/HHA (pt ~70%).  Pt. requires min verbal cues for sternal precautions ADL Comments: Pt. sitting up in chair finishing up brushing teeth.  Pt. reports fatigue.  Pt. ambulated to BR.  Attempted grooming at sink, but pt. too fatigued.  Pt. ambulated back to bed.  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 at home; has hired help with her husband at night and her son and daughter have been switching off care of her husband; son doesn't live in town is just visiting, daughter lives here and doesn't work ADL Eating/Feeding: Performed;Independent Where Assessed - Eating/Feeding: Chair Grooming: Performed;Teeth care;Set up Grooming Details (indicate cue type and reason): fatigues quickly with UE use Where Assessed - Grooming: Sitting, chair Upper Body Bathing: Simulated;Moderate assistance Where Assessed - Upper Body Bathing: Sitting, chair Lower Body Bathing: Maximal assistance;Simulated Lower Body Bathing Details (indicate cue type and reason): can cross foot over opposite knee, but not able to access  foot Where Assessed - Lower Body Bathing: Sitting, chair;Sit to stand from chair Upper Body Dressing: Simulated;Moderate assistance Where Assessed - Upper Body Dressing: Sitting, chair Lower Body Dressing: Performed;Maximal assistance Lower Body Dressing Details (indicate cue type and reason): able to cross foot over opposite knee, but not access foot Where Assessed - Lower Body Dressing: Sitting, chair;Sit to stand from bed Toilet Transfer: Performed;+2 Total assistance;Other (comment) (pt 70%) Toilet Transfer Details (indicate cue type and reason): Pt. ambulated to BR with total A +2 HHA (pt ~70%) Toilet Transfer Method: Proofreader: Comfort height toilet Toileting - Clothing Manipulation: Maximal assistance;Performed Where Assessed - Glass blower/designer Manipulation: Standing Toileting - Hygiene: Performed;Set up Where Assessed - Toileting Hygiene: Sit on 3-in-1 or toilet Ambulation Related to ADLs: Pt. ambulated to the BR with +2 total A/HHA (pt ~70%).  Pt. requires min verbal cues for sternal precautions ADL Comments: Pt. sitting up in chair finishing up brushing teeth.  Pt. reports fatigue.  Pt. ambulated to BR.  Attempted grooming at sink, but pt. too fatigued.  Pt. ambulated back to bed.  Additional Prior Functional Levels:  Bed Mobility: I Transfers: I Mobility - Walk/Wheelchair: I Upper Body Dressing: I Lower Body Dressing: I Grooming: I Eating/Drinking: I Toilet Transfer: I Bladder Continence: WNl Bowel Management: WNL Stair Climbing: I Communication: WNL Memory: WNL Cooking/Meal Prep: I Housework: I Money Management: I Driving: yes  Prior Activity Level: Community (5-7x/wk): Went out daily short distances  ADLs/Mobility: ADL Eating/Feeding: Performed;Independent Where Assessed - Eating/Feeding: Chair Grooming: Performed;Teeth care;Set up Grooming Details (indicate cue type and reason): fatigues quickly with UE use Where Assessed -  Grooming: Sitting, chair Upper Body Bathing: Simulated;Moderate assistance Where Assessed - Upper Body Bathing: Sitting, chair Lower Body Bathing: Maximal assistance;Simulated Lower Body Bathing Details (indicate cue type and reason): can cross foot over opposite knee, but not able to access foot Where Assessed - Lower Body Bathing: Sitting, chair;Sit to stand from chair Upper Body Dressing: Simulated;Moderate assistance Where Assessed - Upper Body Dressing: Sitting, chair Lower Body Dressing: Performed;Maximal assistance Lower Body Dressing Details (indicate cue type and reason): able to cross foot over opposite knee, but not access foot Where Assessed - Lower Body Dressing: Sitting, chair;Sit to stand from bed Toilet Transfer: Performed;+2 Total assistance;Other (comment) (pt 70%) Toilet Transfer Details (indicate cue type and reason): Pt. ambulated to BR with total A +2 HHA (pt ~70%) Toilet Transfer Method: Proofreader: Comfort height toilet Toileting - Clothing Manipulation: Maximal assistance;Performed Where Assessed - Glass blower/designer Manipulation: Standing Toileting - Hygiene: Performed;Set up Where Assessed - Toileting Hygiene: Sit on 3-in-1 or toilet Ambulation Related to ADLs: Pt. ambulated to the BR with +2 total A/HHA (pt ~70%).  Pt. requires min verbal cues for sternal precautions ADL Comments: Pt. sitting up  in chair finishing up brushing teeth.  Pt. reports fatigue.  Pt. ambulated to BR.  Attempted grooming at sink, but pt. too fatigued.  Pt. ambulated back to bed.  Bed Mobility Bed Mobility: Yes Sit to Supine: 1: +2 Total assist (30%) Sit to Supine - Details (indicate cue type and reason): assist to slowly lower shoulders to bed and elevate legs to bed; pt holding pillow through transfer Sit to Sidelying Left: 1: +2 Total assist;Patient percentage (comment) (pt ~50%)) Sit to Sidelying Left Details (indicate cue type and reason): min cues for  sternal precautions Scooting to HOB: 1: +2 Total assist (10%) Transfers Transfers: Yes Sit to Stand: 4: Min assist;From chair/3-in-1 Sit to Stand Details (indicate cue type and reason): min verbal cues for sternal precautions Stand to Sit: To chair/3-in-1 Stand to Sit Details: min verbal cues for sternal precautions Ambulation/Gait Ambulation/Gait: Yes Ambulation/Gait Assistance: 4: Min assist Ambulation/Gait Assistance Details (indicate cue type and reason): amb approx 60 ft with RW; cues for forward gaze and upright posture; pt with very slow gaurded gait Ambulation Distance (Feet): 60 Feet Assistive device: Rolling walker Gait Pattern: Trunk flexed;Shuffle Posture/Postural Control Posture/Postural Control: No significant limitations Postural Limitations: with fatigue pt very flexed with poor endurance for upright posture Balance Balance Assessed: Yes Static Standing Balance Static Standing - Level of Assistance: 1: +2 Total assist Static Standing - Comment/# of Minutes: pt stood with posterior lean and flexed trunk needing +2totalpt60%; this improved some with faciliation and cueing for PA weight shift but with fatigue pt collapses into flexion again  Home Assistive Devices/Equipment:  Home Assistive Devices/Equipment Home Assistive Devices/Equipment: None  Discharge Planning:  Living Arrangements: Spouse/significant other;Children Support Systems: Children Assistance Needed: no Do you have any problems obtaining your medications?: No Type of Residence: Private residence Home Care Services: No Patient expects to be discharged to:: home Case Management Consult Needed: No  Previous Home Environment:  Living Arrangements: Spouse/significant other;Children Support Systems: Children Assistance Needed: no Do you have any problems obtaining your medications?: No Type of Residence: Private residence Home Care Services: No Patient expects to be discharged to:: home  Discharge  Living Setting:  Plans for Discharge Living Setting: Patient's home;House;Lives with (comment) (Lives with husband and son (schizophrenic 95 yr old)) Discharge Living Setting Number of Levels: 1 (Basement has steep steps to laundry room) Discharge Living Setting Number of Steps: none Discharge Living Setting is Bedroom on Main Floor?: Yes Discharge Living Setting is Bathroom on Main Floor?: Yes  Social/Family/Support Systems:  Patient Roles: Spouse;Parent;Caregiver (Wife was caregiver for husband 7a-9p, Alzheimers/Parkinsons) Contact Information: Brandy Zuba (h) [phone] ([phone] Anticipated Caregiver: Hired caregivers and dtr Anticipated Caregiver's Contact Information: Has Home Watch Care hired help Ability/Limitations of Caregiver: Has round the clock care, has long term care policy (Son mows grass, loads/unloads dishwasher, does laundry) Caregiver Availability: 24/7 Discharge Plan Discussed with Primary Caregiver: Yes Is Caregiver In Agreement with Plan?: Yes Does Caregiver/Family have Issues with Lodging/Transportation while Pt is in Rehab?: No  Goals/Additional Needs:  Patient/Family Goal for Rehab: PT/OT S/min A goals, likely S goals (ELOS = 2 weeks (may take less time)) Cultural Considerations: [employer] Dietary Needs: Carb Mod Med Calorie diet Equipment Needs: TBD Pt/Family Agrees to Admission and willing to participate: Yes Program Orientation Provided & Reviewed with Pt/Caregiver Including Roles  & Responsibilities: Yes  Preadmission Screen Completed By:  Trish Mage, 07/18/2011 1:23 PM  Patient's condition:  Please see physician update to information in consult dated 07/13/11.  Preadmission Screen  Competed by: Roderic Palau, RN, Time/Date,1310/07/17/13.  Discussed status with Dr. Riley Kill on 07/18/11 at 1312 (time/date) and received telephone approval for admission today.  Admission Coordinator:  Trish Mage, time1357/Date02/12/13

## 2011-07-18 NOTE — Progress Notes (Signed)
Rehab admissions - I have insurance authorization and can admit to inpatient rehab today.  Pager [phone]

## 2011-07-18 NOTE — Plan of Care (Signed)
Problem: Phase I Progression Outcomes Goal: Initial discharge plan identified Outcome: Completed/Met Date Met:  07/18/11 CIR

## 2011-07-19 ENCOUNTER — Inpatient Hospital Stay (HOSPITAL_COMMUNITY): Payer: Medicare Other

## 2011-07-19 DIAGNOSIS — R5381 Other malaise: Secondary | ICD-10-CM

## 2011-07-19 LAB — URINALYSIS, ROUTINE W REFLEX MICROSCOPIC
Nitrite: NEGATIVE
Protein, ur: NEGATIVE mg/dL
Specific Gravity, Urine: 1.015 (ref 1.005–1.030)
Urobilinogen, UA: 1 mg/dL (ref 0.0–1.0)

## 2011-07-19 LAB — CBC
HCT: 28.5 % — ABNORMAL LOW (ref 36.0–46.0)
MCH: 29.2 pg (ref 26.0–34.0)
MCHC: 32.6 g/dL (ref 30.0–36.0)
MCV: 89.3 fL (ref 78.0–100.0)
Platelets: 465 10*3/uL — ABNORMAL HIGH (ref 150–400)
RDW: 15.7 % — ABNORMAL HIGH (ref 11.5–15.5)

## 2011-07-19 LAB — DIFFERENTIAL
Basophils Relative: 1 % (ref 0–1)
Lymphocytes Relative: 7 % — ABNORMAL LOW (ref 12–46)
Monocytes Relative: 7 % (ref 3–12)
Neutro Abs: 14 10*3/uL — ABNORMAL HIGH (ref 1.7–7.7)
Neutrophils Relative %: 80 % — ABNORMAL HIGH (ref 43–77)

## 2011-07-19 LAB — COMPREHENSIVE METABOLIC PANEL
AST: 19 U/L (ref 0–37)
Albumin: 2.4 g/dL — ABNORMAL LOW (ref 3.5–5.2)
BUN: 21 mg/dL (ref 6–23)
Calcium: 8.8 mg/dL (ref 8.4–10.5)
Chloride: 99 mEq/L (ref 96–112)
Creatinine, Ser: 1.09 mg/dL (ref 0.50–1.10)
Total Bilirubin: 0.9 mg/dL (ref 0.3–1.2)
Total Protein: 5.4 g/dL — ABNORMAL LOW (ref 6.0–8.3)

## 2011-07-19 LAB — URINE MICROSCOPIC-ADD ON

## 2011-07-19 MED ORDER — ENOXAPARIN SODIUM 40 MG/0.4ML ~~LOC~~ SOLN
40.0000 mg | Freq: Every day | SUBCUTANEOUS | Status: DC
  Administered 2011-07-19 – 2011-07-28 (×10): 40 mg via SUBCUTANEOUS
  Filled 2011-07-19 (×11): qty 0.4

## 2011-07-19 MED ORDER — ALTEPLASE 2 MG IJ SOLR
2.0000 mg | Freq: Once | INTRAMUSCULAR | Status: AC
  Administered 2011-07-19: 2 mg
  Filled 2011-07-19: qty 2

## 2011-07-19 MED ORDER — FUROSEMIDE 40 MG PO TABS
40.0000 mg | ORAL_TABLET | Freq: Two times a day (BID) | ORAL | Status: DC
  Administered 2011-07-19 – 2011-07-26 (×14): 40 mg via ORAL
  Filled 2011-07-19 (×16): qty 1

## 2011-07-19 MED ORDER — TRAMADOL HCL 50 MG PO TABS
50.0000 mg | ORAL_TABLET | Freq: Four times a day (QID) | ORAL | Status: DC
  Administered 2011-07-19 – 2011-07-31 (×46): 50 mg via ORAL
  Filled 2011-07-19 (×72): qty 1

## 2011-07-19 NOTE — Evaluation (Signed)
Physical Therapy Assessment and Plan  Patient Details  Name: [name] MRN: [id number] Date of Birth: November 15, 1924  PT Diagnosis: Difficulty walking and Muscle weakness Rehab Potential: Good ELOS: 10-14 days   Today's Date: 07/19/2011 Time: 4782-9562 Time Calculation (min): 55 min  Problem List:  Patient Active Problem List  Diagnoses  . Acute anterior wall MI  . HTN (hypertension)  . Osteoarthritis of knee  . CAD (coronary artery disease)  . VSD (ventricular septal defect)  . Physical deconditioning    Past Medical History:  Past Medical History  Diagnosis Date  . Acute MI, anterior wall     a. 07/07/2011  . HTN (hypertension)   . Osteoarthritis of knee     Bilateral.  a. s/p LTKA ~2007;  b. s/p RTKA 10/2010  . CAD (coronary artery disease)     a. 07/07/2011 - Occluded LAD  . VSD (ventricular septal defect)     a.  07/07/2011 - Noted @ time of MI   Past Surgical History:  Past Surgical History  Procedure Date  . Joint replacement   . Total knee arthroplasty     a.  Left ~ 2007, Right 10/2010  . Cataract extraction, bilateral   . Coronary artery bypass graft 07/07/2011    Procedure: CORONARY ARTERY BYPASS GRAFTING (CABG);  Surgeon: Kathlee Nations Suann Larry, MD;  Location: [facility] OR;  Service: Open Heart Surgery;  Laterality: N/A;  times two using greater saphenous vein harvested via endovascular vein harvest  . Vsd repair 07/07/2011    Procedure: VENTRICULAR SEPTAL DEFECT (VSD) REPAIR;  Surgeon: Mikey Bussing, MD;  Location: [facility] OR;  Service: Open Heart Surgery;  Laterality: N/A;  with bovine pericardium 4cm x 4cm  . Mass excision 07/07/2011    Procedure: EXCISION MASS;  Surgeon: Kathlee Nations Suann Larry, MD;  Location: [facility] OR;  Service: Open Heart Surgery;  Laterality: N/A;  Excision of mediastinal mass    Assessment & Plan Clinical Impression:  Patient is an 78 y.o. female with history of HTN, 3-week history of rest and exertional dyspnea, admitted 02/01 with sudden onset of chest  pain with nausea, vomiting and dyspnea with findings of acute anterior STEMI. Patient taken to cath lab emergently, which revealed large anterior MI with mechanical complication of ventricular septal defect. Patient taken to OR the same day for CABG X2, VSD repair and excision of mediastinal mass. Patient with noted deconditioning after hospital course and easily fatigued. Patient admitted to [facility] 07/19/11 for continued therapies prior to discharge home.  PTA, patient lived with husband in a single level home with level entry. Husband has advanced Parkinson's Disease, and patient was primary caregiver during the day and has hired assistance at night. Per patient, she has increased the number of hours for the hired assistance to be during the day as well. Patient was modified independent with mobility with use of RW PTA and was driving.  Patient currently requires max with mobility secondary to decreased bilat LE strength, power, and eccentric control, decreased muscular and cardiovascular endurance, decreased activity tolerance, and decreased knowledge of sternal precautions resulting in decreased functional independence.Patient will benefit from skilled PT intervention to maximize safe functional mobility, minimize fall risk and decrease caregiver burden for planned discharge home with 24 hour assist.  Anticipate patient will benefit from follow up [facility] at discharge.  PT - End of Session Activity Tolerance: Tolerates 10 - 20 min activity with multiple rests Endurance Deficit: Yes Endurance Deficit Description: fatigues quickly, O2  decreases with mobility from 92% on room air at rest to 79% on room air after gait with RW.  PT Assessment Rehab Potential: Good Barriers to Discharge: Decreased caregiver support PT Plan PT Frequency: 2-3 X/day, 60-90 minutes Estimated Length of Stay: 10-14 days PT Treatment/Interventions: Ambulation/gait training;Balance/vestibular training;DME/adaptive equipment  instruction;Community reintegration;Functional mobility training;Pain management;Patient/family education;Therapeutic Exercise;Therapeutic Activities;Stair training;UE/LE Strength taining/ROM;Wheelchair propulsion/positioning PT Recommendation Follow Up Recommendations: Home health PT Equipment Details: owns RW to be used at d/c  PT Evaluation Precautions/Restrictions Precautions Precautions: Sternal;Fall Required Braces or Orthoses: No Restrictions Weight Bearing Restrictions: No General @[email or web address] [phone]) Vital Signs Therapy Vitals Pulse Rate: 73  (after gait) Oxygen Therapy SpO2: 79 % (after gait) O2 Device: None (Room air);Other (Comment) (O2 added per RN to increase to > 90%) Pulse Oximetry Type: Intermittent Pain Pain Assessment Pain Assessment: 0-10 Pain Score:   3 Pain Type: Acute pain;Surgical pain Pain Location: Other (Comment) (sternum) Pain Orientation: Mid Pain Descriptors: Sore Pain Frequency: Occasional Pain Onset: Gradual Patients Stated Pain Goal: 0 Pain Intervention(s): RN made aware;Emotional support Multiple Pain Sites: No Home Living/Prior Functioning Home Living Lives With: Spouse;Other (Comment) (husband has advanced Parkinson's Disease) Receives Help From: Family;Personal care attendant (has hired assist from Engineer, agricultural for husband) Home Layout: One level;Other (Comment) Home Access: Level entry Bathroom Shower/Tub: Tub/shower unit;Curtain Bathroom Toilet: Standard Bathroom Accessibility: Yes How Accessible: Accessible via walker Home Adaptive Equipment: Walker - rolling Prior Function Level of Independence: Independent with basic ADLs;Independent with homemaking with ambulation;Independent with gait;Independent with transfers Able to Take Stairs?: Yes Driving: Yes Vocation: Retired Comments: 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 Per  patient, she has increasd number of hours for caregiver for her husband to include days as well. Vision/Perception     Chartered loss adjuster Light Touch: Appears Intact Stereognosis: Not tested Hot/Cold: Not tested Proprioception: Appears Intact Additional Comments: symmetrical, no numbness or tingling Coordination Gross Motor Movements are Fluid and Coordinated: Yes Fine Motor Movements are Fluid and Coordinated: Yes Motor  Motor Motor: Within Functional Limits Motor - Skilled Clinical Observations: decreased LE power, eccentric control, and muscular endurance  Mobility Bed Mobility Bed Mobility: No Transfers Transfers: Yes Sit to Stand: 2: Max assist Sit to Stand Details (indicate cue type and reason): focus on sternal precautions Stand to Sit: 3: Mod assist Stand to Sit Details: focus on sternal precautions Locomotion  Ambulation Ambulation/Gait Assistance: 4: Min assist Ambulation Distance (Feet): 40 Feet Assistive device: Rolling walker Stairs / Additional Locomotion Stairs: No Wheelchair Mobility Wheelchair Mobility: No  Trunk/Postural Assessment  Cervical Assessment Cervical Assessment: Exceptions to [facility] (decreased extension and bilat rotation) Thoracic Assessment Thoracic Assessment: Exceptions to [facility] (increased thoracic kyphosis) Lumbar Assessment Lumbar Assessment: Exceptions to [facility] (posterior pelvic tilt) Postural Control Postural Control: Within Functional Limits  Balance Balance Balance Assessed: Yes Static Sitting Balance Static Sitting - Balance Support: No upper extremity supported;Feet supported Static Sitting - Level of Assistance: 7: Independent Dynamic Sitting Balance Dynamic Sitting - Balance Support: No upper extremity supported;Feet supported;During functional activity Dynamic Sitting - Level of Assistance: 5: Stand by assistance Static Standing Balance Static Standing - Balance Support: No upper extremity supported Static  Standing - Level of Assistance: 4: Min assist Dynamic Standing Balance Dynamic Standing - Balance Support: Bilateral upper extremity supported;During functional activity Dynamic Standing - Level of Assistance: 4: Min assist Extremity Assessment      RLE Assessment RLE Assessment:  (AROM  WNL. Strength grossly 3/5.) LLE Assessment LLE Assessment:  (AROM WNL. Strength grossly 3/5.)  Treatment Initiated During Session: Patient with O2 saturation of 79% on room air following gait x 40 feet with RW. Patient with short, shallow breathing at rest and during mobility due to pain and discomfort in sternum. Education provided for pursed lip breathing and use of incentive spirometer every hour to prevent contracture and maintain O2 saturation > 90%. Patient demonstrated understanding with mod cues of PT. Also provided education for sternal precautions and sit-stand transfers without use of UEs. Practiced with 3 trials with mod-max assist while holding heart pillow to prevent pushing up with bilat UEs. Patient very fatigued at end of session.  See FIM for current functional status Refer to Care Plan for Long Term Goals  Recommendations for other services: None  Discharge Criteria: Patient will be discharged from PT if patient refuses treatment 3 consecutive times without medical reason, if treatment goals not met, if there is a change in medical status, if patient makes no progress towards goals or if patient is discharged from hospital.  The above assessment, treatment plan, treatment alternatives and goals were discussed and mutually agreed upon: by patient  Romeo Rabon 07/19/2011, 10:46 AM

## 2011-07-19 NOTE — H&P (Signed)
Physical Medicine and Rehabilitation Admission H&P  [name] is an 78 y.o. female.  No chief complaint on file.  :  HPI: [name] is an 78 y.o. right-handed female with history of HTN, 3 week history of rest and exertional dyspnea, admitted 02/01 with sudden onset of chest pain with nausea, vomiting and dyspnea the night PTA with findings of acute anterior STEMI. Code STEMI was called. Patient taken to cath lab emergently by Dr. Eden Emms. This revealed large anterior MI with mechanical complication of ventricular septal defect. Dr Tyrone Sage consulted and patient taken to OR the same day for CABG X2, VSD repair and excision of mediastinal mass. Post op abla Treated with transfusion and latest hemoglobin of 9.6. Extubated 02/04. Pathology pending-likely malignant thymoma with planned followup radiation oncology as an outpatient.. Patient with noted deconditioning after hospital course and easily fatigued with physical medicine and rehabilitation consulted for consideration of inpatient rehabilitation services  Review of Systems  Eyes: Negative for blurred vision and double vision.  Respiratory: Positive for cough.  Cardiovascular: Positive for chest pain (chest wall pain.).  Gastrointestinal: Positive for diarrhea. Negative for heartburn and nausea.  Genitourinary: Negative for urgency and frequency.  Musculoskeletal: Positive for myalgias.  Neurological: Negative for headaches.  All other systems reviewed and are negative  Past Medical History   Diagnosis  Date   .  Acute MI, anterior wall      a. 07/07/2011   .  HTN (hypertension)    .  Osteoarthritis of knee      Bilateral. a. s/p LTKA ~2007; b. s/p RTKA 10/2010   .  CAD (coronary artery disease)      a. 07/07/2011 - Occluded LAD   .  VSD (ventricular septal defect)      a. 07/07/2011 - Noted @ time of MI    Past Surgical History   Procedure  Date   .  Joint replacement    .  Total knee arthroplasty      a. Left ~ 2007, Right  10/2010   .  Cataract extraction, bilateral    .  Coronary artery bypass graft  07/07/2011     Procedure: CORONARY ARTERY BYPASS GRAFTING (CABG); Surgeon: Kathlee Nations Suann Larry, MD; Location: [facility] OR; Service: Open Heart Surgery; Laterality: N/A; times two using greater saphenous vein harvested via endovascular vein harvest   .  Vsd repair  07/07/2011     Procedure: VENTRICULAR SEPTAL DEFECT (VSD) REPAIR; Surgeon: Mikey Bussing, MD; Location: [facility] OR; Service: Open Heart Surgery; Laterality: N/A; with bovine pericardium 4cm x 4cm   .  Mass excision  07/07/2011     Procedure: EXCISION MASS; Surgeon: Kathlee Nations Suann Larry, MD; Location: [facility] OR; Service: Open Heart Surgery; Laterality: N/A; Excision of mediastinal mass    History reviewed. No pertinent family history.  Social History: reports that she has never smoked. She has never used smokeless tobacco. She reports that she does not drink alcohol or use illicit drugs.  Allergies: No Known Allergies  Medications Prior to Admission   Medication  Dose  Route  Frequency  Provider  Last Rate  Last Dose   .  0.9 % sodium chloride infusion  250 mL  Intravenous  PRN  [name], MD     .  acetaminophen (TYLENOL) solution 650 mg  650 mg  Per Tube  NOW  Donielle Margaretann Loveless, PA      Or   .  acetaminophen (TYLENOL) suppository 650  mg  650 mg  Rectal  NOW  Ardelle Balls, PA     .  acetaminophen (TYLENOL) tablet 1,000 mg  1,000 mg  Oral  Q6H  Ardelle Balls, PA   1,000 mg at 07/12/11 2004    Or   .  acetaminophen (TYLENOL) solution 975 mg  975 mg  Per Tube  Q6H  Ardelle Balls, PA   975 mg at 07/10/11 0121   .  acetaminophen (TYLENOL) tablet 650 mg  650 mg  Oral  Q6H PRN  [name], MD     .  albumin human 5 % solution 250 mL  250 mL  Intravenous  Q15 min PRN  Ardelle Balls, PA   250 mL at 07/08/11 0412   .  aspirin EC tablet 325 mg  325 mg  Oral  Daily  [name], MD   325 mg at 07/18/11 0934   .  bisacodyl  (DULCOLAX) EC tablet 10 mg  10 mg  Oral  Daily PRN  [name], MD      Or   .  bisacodyl (DULCOLAX) suppository 10 mg  10 mg  Rectal  Daily PRN  [name], MD     .  bivalirudin (ANGIOMAX) 250 MG injection         .  carvedilol (COREG) tablet 6.25 mg  6.25 mg  Oral  BID WC  [name], MD   6.25 mg at 07/18/11 1610   .  cefUROXime (ZINACEF) 1.5 g in dextrose 5 % 50 mL IVPB  1.5 g  Intravenous  Q12H  Ardelle Balls, PA   1.5 g at 07/09/11 1036   .  chlorhexidine (PERIDEX) 0.12 % solution       15 mL at 07/08/11 0816   .  chlorhexidine (PERIDEX) 0.12 % solution       15 mL at 07/09/11 1020   .  dextrose 50 % solution       14 mL at 07/07/11 2324   .  diphenhydrAMINE (BENADRYL) capsule 25 mg  25 mg  Oral  QHS PRN  Purcell Nails, MD   25 mg at 07/17/11 2205   .  docusate sodium (COLACE) capsule 200 mg  200 mg  Oral  Daily  [name], MD     .  famotidine (PEPCID) IVPB 20 mg  20 mg  Intravenous  Q12H  Ardelle Balls, PA   20 mg at 07/08/11 1059   .  feeding supplement (ENSURE CLINICAL STRENGTH) liquid 237 mL  237 mL  Oral  TID WC  [name], MD   237 mL at 07/18/11 0811   .  furosemide (LASIX) injection 40 mg  40 mg  Intravenous  Once  [name], MD   40 mg at 07/09/11 2007   .  furosemide (LASIX) injection 40 mg  40 mg  Intravenous  BID  [name], MD   40 mg at 07/10/11 1801   .  furosemide (LASIX) injection 40 mg  40 mg  Intravenous  Once  [name], MD   40 mg at 07/11/11 1032   .  furosemide (LASIX) tablet 40 mg  40 mg  Oral  Daily  [name], MD   40 mg at 07/18/11 0935   .  heparin 2-0.9 UNIT/ML-% infusion         .  heparin 2-0.9 UNIT/ML-% infusion         .  lactated ringers infusion 500 mL  500 mL  Intravenous  Once PRN  Ardelle Balls, PA  500 mL/hr at 07/07/11 2300  500 mL at 07/07/11 2300   .  lidocaine (XYLOCAINE) 1 % injection         .  lidocaine (XYLOCAINE) 1 % injection         .  magnesium  sulfate IVPB 4 g 100 mL  4 g  Intravenous  Once  Ardelle Balls, PA   4 g at 07/07/11 2138   .  morphine 2 MG/ML injection 1-4 mg  1-4 mg  Intravenous  Q1H PRN  Ardelle Balls, PA     .  moving right along book   Does not apply  Once  [name], MD     .  nitroGLYCERIN (NTG ON-CALL) 0.2 mg/mL injection         .  nitroglycerin/verapamil/heparin/sodium bicarbonate solution irrigation for artery spasm   Irrigation  To OR  [name], MD     .  ondansetron [facility]) tablet 4 mg  4 mg  Oral  Q6H PRN  [name], MD      Or   .  ondansetron [facility]) injection 4 mg  4 mg  Intravenous  Q6H PRN  [name], MD     .  pantoprazole (PROTONIX) EC tablet 40 mg  40 mg  Oral  Q1200  [name], MD     .  potassium chloride 10 mEq in 50 mL *CENTRAL LINE* IVPB  10 mEq  Intravenous  Q1 Hr x 3  Ardelle Balls, PA   10 mEq at 07/07/11 2342   .  potassium chloride 10 mEq in 50 mL *CENTRAL LINE* IVPB  10 mEq  Intravenous  Q1 Hr x 3  [name], MD   10 mEq at 07/10/11 0759   .  potassium chloride 10 mEq in 50 mL *CENTRAL LINE* IVPB  10 mEq  Intravenous  Q1 Hr x 3  [name], MD   10 mEq at 07/11/11 0855   .  potassium chloride 10 mEq in 50 mL *CENTRAL LINE* IVPB  10 mEq  Intravenous  Q1H PRN  [name], MD   10 mEq at 07/14/11 0656   .  potassium chloride 10 mEq in 50 mL *CENTRAL LINE* IVPB  10 mEq  Intravenous  Q1H PRN  Purcell Nails, MD   10 mEq at 07/17/11 0730   .  potassium chloride 10 MEQ/50ML IVPB       10 mEq at 07/14/11 0545   .  potassium chloride 10 MEQ/50ML IVPB       10 mEq at 07/17/11 0907   .  potassium chloride SA (K-DUR,KLOR-CON) CR tablet 20 mEq  20 mEq  Oral  Daily  [name], MD   20 mEq at 07/18/11 0935   .  ramipril (ALTACE) capsule 2.5 mg  2.5 mg  Oral  Daily  [name], MD     .  rosuvastatin (CRESTOR) tablet 5 mg  5 mg  Oral  q1800  [name], MD   5 mg at 07/17/11 1755   .  sodium chloride  0.9 % injection 3 mL  3 mL  Intravenous  Q12H  [name], MD   3 mL at 07/18/11 0935   .  sodium chloride 0.9 % injection 3 mL  3 mL  Intravenous  PRN  [name], MD     .  vancomycin [facility]) 1,000 mg in sodium chloride 0.9 % 100 mL IVPB  1,000 mg  Intravenous  Once  Ardelle Balls, PA   1,000 mg at 07/08/11 0339   .  DISCONTD: 0.45 % sodium chloride infusion   Intravenous  Continuous  Ardelle Balls, PA  20 mL/hr at 07/15/11 1500    .  DISCONTD: 0.9 % sodium chloride infusion  250 mL  Intravenous  PRN  Ok Anis, NP     .  DISCONTD: 0.9 % sodium chloride infusion  250 mL  Intravenous  PRN  Ok Anis, NP     .  DISCONTD: 0.9 % sodium chloride infusion    Continuous PRN  Andree Elk, CRNA     .  DISCONTD: 0.9 % sodium chloride infusion   Intravenous  Continuous  Ardelle Balls, PA  20 mL/hr at 07/14/11 2000    .  DISCONTD: 0.9 % sodium chloride infusion  250 mL  Intravenous  Continuous  Ardelle Balls, PA  1 mL/hr at 07/13/11 0430  250 mL at 07/13/11 0430   .  DISCONTD: 0.9 % irrigation (POUR BTL)    PRN  Kathlee Nations Trigt III, MD   1,000 mL at 07/07/11 1652   .  DISCONTD: acetaminophen (TYLENOL) tablet 650 mg  650 mg  Oral  Q4H PRN  Ok Anis, NP     .  DISCONTD: acetaminophen (TYLENOL) tablet 650 mg  650 mg  Oral  Q4H PRN  Ok Anis, NP     .  DISCONTD: albumin human 5 % solution    Continuous PRN  Andree Elk, CRNA     .  DISCONTD: ALPRAZolam Prudy Feeler) tablet 0.25 mg  0.25 mg  Oral  BID PRN  Ok Anis, NP     .  DISCONTD: ALPRAZolam Prudy Feeler) tablet 0.25 mg  0.25 mg  Oral  BID PRN  Ok Anis, NP     .  DISCONTD: aminocaproic acid (AMICAR) 10 g in sodium chloride 0.9 % 100 mL infusion   Intravenous  To OR  [name], MD     .  DISCONTD: aminocaproic acid (AMICAR) 10 g in sodium chloride 0.9 % 100 mL infusion   Intravenous  To OR  [name], MD     .  DISCONTD:  aminocaproic acid (AMICAR) 10 g in sodium chloride 0.9 % 140 mL infusion  10 g   Continuous PRN  Andree Elk, CRNA   1 g/hr at 07/07/11 1615   .  DISCONTD: aspirin chewable tablet 324 mg  324 mg  Per Tube  Daily  Ardelle Balls, PA   324 mg at 07/10/11 1005   .  DISCONTD: aspirin EC tablet 325 mg  325 mg  Oral  Daily  Ardelle Balls, PA   325 mg at 07/17/11 0930   .  DISCONTD: aspirin EC tablet 81 mg  81 mg  Oral  Daily  Ok Anis, NP     .  DISCONTD: aspirin EC tablet 81 mg  81 mg  Oral  Daily  Ok Anis, NP     .  DISCONTD: bisacodyl (DULCOLAX) EC tablet 10 mg  10 mg  Oral  Daily  Ardelle Balls, PA   10 mg at 07/16/11 1059   .  DISCONTD: bisacodyl (DULCOLAX) suppository 10 mg  10 mg  Rectal  Daily  Ardelle Balls, PA   10 mg at 07/10/11 1005   .  DISCONTD: cefUROXime (ZINACEF) 1.5 g in dextrose 5 % 50 mL IVPB  1.5 g  Intravenous  To OR  [name], MD     .  DISCONTD: cefUROXime (ZINACEF) 1.5 g in dextrose 5 % 50 mL IVPB  1.5 g  Intravenous  To OR  [name], MD     .  DISCONTD: cefUROXime (ZINACEF) 1.5 g in dextrose 5 % 50 mL IVPB  1.5 g  Intravenous  To OR  [name], MD     .  DISCONTD: cefUROXime (ZINACEF) 1.5 g in dextrose 5 % 50 mL IVPB  1.5 g   Continuous PRN  Andree Elk, CRNA   1.5 g at 07/07/11 1545   .  DISCONTD: cefUROXime (ZINACEF) 750 mg in dextrose 5 % 50 mL IVPB  750 mg  Intravenous  To OR  [name], MD     .  DISCONTD: cefUROXime (ZINACEF) 750 mg in dextrose 5 % 50 mL IVPB  750 mg  Intravenous  To OR  [name], MD     .  DISCONTD: chlorhexidine (HIBICLENS) 4 % liquid 4 application  60 mL  Topical  Once  Ardelle Balls, PA     .  DISCONTD: chlorhexidine (HIBICLENS) 4 % liquid 4 application  60 mL  Topical  Once  Ardelle Balls, PA     .  DISCONTD: chlorhexidine (HIBICLENS) 4 % liquid 4 application  60 mL  Topical  Once  Ardelle Balls, PA     .  DISCONTD:  dexmedetomidine (PRECEDEX) 200 mcg in sodium chloride 0.9 % 50 mL infusion  200 mcg   Continuous PRN  Andree Elk, CRNA  11 mL/hr at 07/07/11 2012  0.7 mcg/kg/hr at 07/07/11 2012   .  DISCONTD: dexmedetomidine (PRECEDEX) 200 mcg in sodium chloride 0.9 % 50 mL infusion  0.1-0.7 mcg/kg/hr  Intravenous  Continuous  Ardelle Balls, PA   0.1 mcg/kg/hr at 07/10/11 1200   .  DISCONTD: dexmedetomidine (PRECEDEX) 400 mcg in sodium chloride 0.9 % 100 mL infusion  0.1-0.7 mcg/kg/hr  Intravenous  To OR  [name], MD     .  DISCONTD: dexmedetomidine (PRECEDEX) 400 mcg in sodium chloride 0.9 % 100 mL infusion  0.1-0.7 mcg/kg/hr  Intravenous  To OR  [name], MD     .  DISCONTD: diphenhydrAMINE (BENADRYL) capsule 25 mg  25 mg  Oral  QHS PRN  Kathlee Nations Trigt III, MD   25 mg at 07/16/11 2116   .  DISCONTD: docusate (COLACE) 50 MG/5ML liquid 200 mg  200 mg  Oral  Daily  [name], MD   200 mg at 07/10/11 1005   .  DISCONTD: docusate sodium (COLACE) capsule 200 mg  200 mg  Oral  Daily  Ardelle Balls, PA     .  DISCONTD: docusate sodium (COLACE) capsule 200 mg  200 mg  Oral  Daily  [name], MD   200 mg at 07/16/11 1059   .  DISCONTD: DOPamine (INTROPIN) 800 mg in dextrose 5 % 250 mL infusion  2-20 mcg/kg/min  Intravenous  To OR  [name], MD     .  DISCONTD: DOPamine (INTROPIN) 800 mg in dextrose 5 % 250 mL infusion  2-20 mcg/kg/min  Intravenous  To OR  Gwenith Daily  Tyrone Sage, MD     .  DISCONTD: DOPamine (INTROPIN) 800 mg in dextrose 5 % 250 mL infusion    Continuous PRN  Tyrone Nine, CRNA  3.5 mL/hr at 07/07/11 1805  3 mcg/kg/min at 07/07/11 1805   .  DISCONTD: DOPamine (INTROPIN) 800 mg in dextrose 5 % 250 mL infusion  0-3 mcg/kg/min  Intravenous  Continuous  Ardelle Balls, PA  3.5 mL/hr at 07/11/11 1800  3 mcg/kg/min at 07/11/11 1800   .  DISCONTD: EPINEPHrine (ADRENALIN) 4,000 mcg in dextrose 5 % 250 mL infusion  0.5-20 mcg/min  Intravenous  To OR   [name], MD     .  DISCONTD: EPINEPHrine (ADRENALIN) 4,000 mcg in dextrose 5 % 250 mL infusion  0.5-20 mcg/min  Intravenous  To OR  [name], MD     .  DISCONTD: etomidate (AMIDATE) injection    PRN  Andree Elk, CRNA   16 mg at 07/07/11 1538   .  DISCONTD: fentaNYL (SUBLIMAZE) injection    PRN  Andree Elk, CRNA   250 mcg at 07/07/11 1941   .  DISCONTD: furosemide (LASIX) injection 40 mg  40 mg  Intravenous  BID  [name], MD   40 mg at 07/15/11 0855   .  DISCONTD: furosemide (LASIX) tablet 40 mg  40 mg  Oral  Daily  Kathlee Nations Suann Larry, MD     .  DISCONTD: hemostatic agents    PRN  Kathlee Nations Suann Larry, MD   1 application at 07/07/11 1725   .  DISCONTD: hemostatic agents    PRN  [name], MD   1 application at 07/07/11 1838   .  DISCONTD: heparin ADULT infusion 100 units/ml (25000 units/250 ml)  750 Units/hr  Intravenous  Continuous  Wendall Stade, MD  7.5 mL/hr at 07/07/11 1406  750 Units/hr at 07/07/11 1406   .  DISCONTD: heparin injection    PRN  Andree Elk, CRNA   28,000 Units at 07/07/11 1646   .  DISCONTD: insulin aspart (novoLOG) injection 0-15 Units  0-15 Units  Subcutaneous  TID WC  Kerin Perna III, MD   3 Units at 07/17/11 1216   .  DISCONTD: insulin aspart (novoLOG) injection 0-24 Units  0-24 Units  Subcutaneous  Q4H  [name], MD   2 Units at 07/14/11 2021   .  DISCONTD: insulin aspart (novoLOG) injection 0-5 Units  0-5 Units  Subcutaneous  QHS  Kathlee Nations Trigt III, MD     .  DISCONTD: insulin regular (NOVOLIN R,HUMULIN R) 1 Units/mL in sodium chloride 0.9 % 100 mL infusion   Intravenous  To OR  [name], MD     .  DISCONTD: insulin regular (NOVOLIN R,HUMULIN R) 1 Units/mL in sodium chloride 0.9 % 100 mL infusion   Intravenous  To OR  [name], MD     .  DISCONTD: insulin regular (NOVOLIN R,HUMULIN R) 1 Units/mL in sodium chloride 0.9 % 100 mL infusion   Intravenous  Continuous  Ardelle Balls, PA  2.8 mL/hr at 07/08/11 1400  2.8 Units/hr at 07/08/11 1400   .  DISCONTD: insulin regular (NOVOLIN R,HUMULIN R) 100 Units in sodium chloride 0.9 % 100 mL infusion  100 Units   Continuous PRN  Andree Elk, CRNA  8.7 mL/hr at 07/07/11 2005  8.7 Units/hr at 07/07/11 2005   .  DISCONTD:  insulin regular bolus via infusion 0-10 Units  0-10 Units  Intravenous  TID WC  Ardelle Balls, PA   2.8 Units at 07/08/11 1400   .  DISCONTD: lactated ringers infusion    Continuous PRN  Andree Elk, CRNA     .  DISCONTD: lactated ringers infusion    Continuous PRN  Andree Elk, CRNA     .  DISCONTD: lactated ringers infusion    Continuous PRN  Andree Elk, CRNA     .  DISCONTD: lactated ringers infusion   Intravenous  Continuous  Ardelle Balls, PA  10 mL/hr at 07/11/11 0700    .  DISCONTD: magnesium sulfate (IV Push/IM) injection 40 mEq  40 mEq  Other  To OR  [name], MD     .  DISCONTD: magnesium sulfate (IV Push/IM) injection 40 mEq  40 mEq  Other  To OR  [name], MD     .  DISCONTD: metoprolol (LOPRESSOR) injection 2.5-5 mg  2.5-5 mg  Intravenous  Q2H PRN  Ardelle Balls, PA     .  DISCONTD: metoprolol tartrate (LOPRESSOR) 25 mg/10 mL oral suspension 12.5 mg  12.5 mg  Per Tube  BID  Ardelle Balls, PA     .  DISCONTD: metoprolol tartrate (LOPRESSOR) tablet 12.5 mg  12.5 mg  Oral  BID  Ardelle Balls, PA   12.5 mg at 07/17/11 0930   .  DISCONTD: midazolam (VERSED) 5 MG/5ML injection    PRN  Andree Elk, CRNA   2 mg at 07/07/11 1859   .  DISCONTD: midazolam (VERSED) injection 2 mg  2 mg  Intravenous  Q1H PRN  Ardelle Balls, PA   1 mg at 07/10/11 0227   .  DISCONTD: milrinone (PRIMACOR) infusion 200 mcg/mL (0.2 mg/mL)  0.3 mcg/kg/min  Intravenous  Continuous  Ardelle Balls, PA  5.7 mL/hr at 07/11/11 0700  0.3 mcg/kg/min at 07/11/11 0700   .  DISCONTD: milrinone (PRIMACOR) infusion 200 mcg/mL (0.2 mg/mL)     Continuous PRN  Andree Elk, CRNA  7.1 mL/hr at 07/07/11 1915  0.375 mcg/kg/min at 07/07/11 1915   .  DISCONTD: morphine 2 MG/ML injection 1 mg  1 mg  Intravenous  Q1H PRN  Ok Anis, NP     .  DISCONTD: morphine 4 MG/ML injection 2-5 mg  2-5 mg  Intravenous  Q1H PRN  Ardelle Balls, PA   1 mg at 07/10/11 1408   .  DISCONTD: morphine injection 1 mg  1 mg  Intravenous  Q1H PRN  Ok Anis, NP     .  DISCONTD: nitroGLYCERIN (NITROSTAT) SL tablet 0.4 mg  0.4 mg  Sublingual  Q5 Min x 3 PRN  Ok Anis, NP     .  DISCONTD: nitroGLYCERIN (NITROSTAT) SL tablet 0.4 mg  0.4 mg  Sublingual  Q5 Min x 3 PRN  Ok Anis, NP     .  DISCONTD: nitroGLYCERIN 0.2 mg/mL in dextrose 5 % infusion  2-200 mcg/min  Intravenous  To OR  [name], MD     .  DISCONTD: nitroGLYCERIN 0.2 mg/mL in dextrose 5 % infusion  2-200 mcg/min  Intravenous  To OR  [name], MD     .  DISCONTD: nitroGLYCERIN 0.2 mg/mL in dextrose 5 % infusion    Continuous PRN  Andree Elk, CRNA  5 mL/hr at  07/07/11 2011  16.6 mcg/min at 07/07/11 2011   .  DISCONTD: nitroGLYCERIN 0.2 mg/mL in dextrose 5 % infusion  0-100 mcg/min  Intravenous  Continuous  Ardelle Balls, PA  5 mL/hr at 07/07/11 2145  16.667 mcg/min at 07/07/11 2145   .  DISCONTD: nitroglycerin/verapamil/heparin/sodium bicarbonate solution irrigation for artery spasm   Irrigation  To OR  [name], MD     .  DISCONTD: ondansetron [facility]) injection 4 mg  4 mg  Intravenous  Q6H PRN  Ok Anis, NP     .  DISCONTD: ondansetron (ZOFRAN) injection 4 mg  4 mg  Intravenous  Q6H PRN  Ok Anis, NP     .  DISCONTD: ondansetron (ZOFRAN) injection 4 mg  4 mg  Intravenous  Q6H PRN  Ardelle Balls, PA   4 mg at 07/16/11 0929   .  DISCONTD: oxyCODONE (Oxy IR/ROXICODONE) immediate release tablet 5-10 mg  5-10 mg  Oral  Q3H PRN  Ardelle Balls, PA   5 mg at 07/11/11 0610   .  DISCONTD:  pantoprazole (PROTONIX) EC tablet 40 mg  40 mg  Oral  Q1200  Ardelle Balls, PA     .  DISCONTD: pantoprazole (PROTONIX) EC tablet 40 mg  40 mg  Oral  Q1200  [name], MD   40 mg at 07/17/11 1216   .  DISCONTD: pantoprazole sodium (PROTONIX) 40 mg/20 mL oral suspension 40 mg  40 mg  Per Tube  Q1200  [name], MD   40 mg at 07/09/11 1501   .  DISCONTD: phenylephrine (NEO-SYNEPHRINE) 0.08 mg/mL in dextrose 5 % 250 mL infusion  20 mg   Continuous PRN  Andree Elk, CRNA   50 mcg/min at 07/07/11 1542   .  DISCONTD: phenylephrine (NEO-SYNEPHRINE) 20,000 mcg in dextrose 5 % 250 mL infusion  30-200 mcg/min  Intravenous  To OR  [name], MD     .  DISCONTD: phenylephrine (NEO-SYNEPHRINE) 20,000 mcg in dextrose 5 % 250 mL infusion  30-200 mcg/min  Intravenous  To OR  [name], MD     .  DISCONTD: phenylephrine (NEO-SYNEPHRINE) 20,000 mcg in dextrose 5 % 250 mL infusion  0-100 mcg/min  Intravenous  Continuous  Ardelle Balls, PA  2.3 mL/hr at 07/10/11 0545  3 mcg/min at 07/10/11 0545   .  DISCONTD: potassium chloride injection 80 mEq  80 mEq  Other  To OR  [name], MD     .  DISCONTD: potassium chloride injection 80 mEq  80 mEq  Other  To OR  [name], MD     .  DISCONTD: protamine injection    PRN  Andree Elk, CRNA   180 mg at 07/07/11 1945   .  DISCONTD: rocuronium (ZEMURON) injection    PRN  Andree Elk, CRNA   50 mg at 07/07/11 1859   .  DISCONTD: rosuvastatin (CRESTOR) tablet 40 mg  40 mg  Oral  Daily  Ok Anis, NP     .  DISCONTD: rosuvastatin (CRESTOR) tablet 40 mg  40 mg  Oral  q1800  Ok Anis, NP     .  DISCONTD: sodium chloride 0.9 % injection 10 mL  10 mL  Intravenous  Q12H  [name], MD   10 mL at 07/16/11 2115   .  DISCONTD: sodium chloride 0.9 % injection 10 mL  10 mL  Intravenous  PRN  [name], MD   10 mL at 07/08/11 0304   .  DISCONTD: sodium chloride 0.9 % injection  10-40 mL  10-40 mL  Intracatheter  Q12H  [name], MD   10 mL at 07/17/11 0907   .  DISCONTD: sodium chloride 0.9 % injection 10-40 mL  10-40 mL  Intracatheter  PRN  [name], MD     .  DISCONTD: sodium chloride 0.9 % injection 3 mL  3 mL  Intravenous  Q12H  Ok Anis, NP     .  DISCONTD: sodium chloride 0.9 % injection 3 mL  3 mL  Intravenous  PRN  Ok Anis, NP     .  DISCONTD: sodium chloride 0.9 % injection 3 mL  3 mL  Intravenous  Q12H  Ok Anis, NP     .  DISCONTD: sodium chloride 0.9 % injection 3 mL  3 mL  Intravenous  PRN  Ok Anis, NP     .  DISCONTD: sodium chloride 0.9 % injection 3 mL  3 mL  Intravenous  Q12H  Ardelle Balls, PA   3 mL at 07/10/11 2200   .  DISCONTD: sodium chloride 0.9 % injection 3 mL  3 mL  Intravenous  PRN  Ardelle Balls, PA     .  DISCONTD: Surgifoam 1 Gm with 0.9% sodium chloride (4 ml) topical solution    PRN  [name], MD     .  DISCONTD: traMADol Janean Sark) tablet 50 mg  50 mg  Oral  TID PRN  [name], MD   50 mg at 07/13/11 4098   .  DISCONTD: traMADol (ULTRAM) tablet 50 mg  50 mg  Oral  Q6H PRN  [name], MD   50 mg at 07/16/11 2040   .  DISCONTD: vancomycin (VANCOCIN) 1,000 mg in sodium chloride 0.9 % 250 mL IVPB  1,000 mg  Intravenous  Continuous PRN  Andree Elk, CRNA   1,000 mg at 07/07/11 1605   .  DISCONTD: vancomycin (VANCOCIN) 1,250 mg in sodium chloride 0.9 % 250 mL IVPB  1,250 mg  Intravenous  To OR  [name], MD     .  DISCONTD: vancomycin (VANCOCIN) 1,250 mg in sodium chloride 0.9 % 250 mL IVPB  1,250 mg  Intravenous  To OR  [name], MD     .  DISCONTD: vancomycin (VANCOCIN) 1,250 mg in sodium chloride 0.9 % 250 mL IVPB  1,250 mg  Intravenous  To OR  [name], MD     .  DISCONTD: vecuronium (NORCURON) injection    PRN  Andree Elk, CRNA   3 mg at 07/07/11 1640    No current outpatient prescriptions on file  as of 07/18/2011.    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 and needs physical help at home; has hired help with her husband at night and her son and  daughter have been switching off care of her husband; son doesn't live in town is just visiting, daughter lives here and doesn't work  Functional Status:  Mobility:  Bed Mobility  Bed Mobility: Yes  Sit to Supine: 1: +2 Total assist (30%)  Sit to Supine - Details (indicate cue type and reason): assist to slowly lower shoulders to bed and elevate legs to bed; pt holding pillow through transfer  Sit to Sidelying Left: 1: +2 Total assist;Patient percentage (comment) (pt ~50%))  Sit to Sidelying Left Details (indicate cue type and reason): min cues for sternal precautions  Scooting to HOB: 1: +2 Total assist (10%)  Transfers  Transfers: Yes  Sit to Stand: 4: Min assist;From chair/3-in-1  Sit to Stand Details (indicate cue type and reason): min verbal cues for sternal precautions  Stand to Sit: To chair/3-in-1  Stand to Sit Details: min verbal cues for sternal precautions  Ambulation/Gait  Ambulation/Gait: Yes  Ambulation/Gait Assistance: 4: Min assist  Ambulation/Gait Assistance Details (indicate cue type and reason): amb approx 60 ft with RW; cues for forward gaze and upright posture; pt with very slow gaurded gait  Ambulation Distance (Feet): 60 Feet  Assistive device: Rolling walker  Gait Pattern:  Trunk flexed;Shuffle   ADL:  ADL  Eating/Feeding: Performed;Independent  Where Assessed - Eating/Feeding: Chair  Grooming: Performed;Teeth care;Set up  Grooming Details (indicate cue type and reason): fatigues quickly with UE use  Where Assessed - Grooming: Sitting, chair  Upper Body Bathing: Simulated;Moderate assistance  Where Assessed - Upper Body Bathing: Sitting, chair  Lower Body Bathing: Maximal assistance;Simulated  Lower Body Bathing Details (indicate cue type and reason): can cross foot over opposite knee, but not able to access foot  Where Assessed - Lower Body Bathing: Sitting, chair;Sit to stand from chair  Upper Body Dressing: Simulated;Moderate assistance  Where Assessed - Upper Body Dressing: Sitting, chair  Lower Body Dressing: Performed;Maximal assistance  Lower Body Dressing Details (indicate cue type and reason): able to cross foot over opposite knee, but not access foot  Where Assessed - Lower Body Dressing: Sitting, chair;Sit to stand from bed  Toilet Transfer: Performed;+2 Total assistance;Other (comment) (pt 70%)  Toilet Transfer Details (indicate cue type and reason): Pt. ambulated to BR with total A +2 HHA (pt ~70%)  Toilet Transfer Method: Engineer, drilling: Comfort height toilet  Toileting - Clothing Manipulation: Maximal assistance;Performed  Where Assessed - Glass blower/designer Manipulation: Standing  Toileting - Hygiene: Performed;Set up  Where Assessed - Toileting Hygiene: Sit on 3-in-1 or toilet  Ambulation Related to ADLs: Pt. ambulated to the BR with +2 total A/HHA (pt ~70%). Pt. requires min verbal cues for sternal precautions  ADL Comments: Pt. sitting up in chair finishing up brushing teeth. Pt. reports fatigue. Pt. ambulated to BR. Attempted grooming at sink, but pt. too fatigued. Pt. ambulated back to bed.  Cognition:  Cognition  Arousal/Alertness: Awake/alert  Orientation Level: Oriented X4  Cognition  Arousal/Alertness:  Awake/alert  Overall Cognitive Status: Appears within functional limits for tasks assessed  Orientation Level: Oriented X4  Blood pressure 93/40, pulse 77, temperature 97.3 F (36.3 C), temperature source Oral, resp. rate 26, height 5\' 4"  (1.626 m), weight 65.4 kg (144 lb 2.9 oz), SpO2 100.00%.  Physical Exam  Nursing note and vitals reviewed.  Constitutional: She is oriented to person, place, and time. She appears well-developed and well-nourished.  HENT:  Head: Normocephalic and atraumatic.  Eyes: Pupils are equal, round, and reactive to light. eomi  Neck:  Normal range of motion. Neck supple.  Cardiovascular: Normal rate and regular rhythm.  Pulmonary/Chest: Effort normal. She has wheezes. She exhibits tenderness.  Abdominal: Soft. Bowel sounds are normal.  Musculoskeletal: Normal range of motion.  Neurological: She is alert and oriented to person, place, and time.  Skin: Skin is warm and dry. Chest incision is clean and dry. Some redness along sacrum, bruising along left leg incision with healing wound. Psychiatric: She has a normal mood and affect but is fatigued. Her behavior is normal. Thought content normal.  motor strength is 4/5 in bilateral deltoid, biceps, triceps, and grip as well as hip flexion, knee extension, ankle dorsiflexion.  Sensation is intact to light touch in bilateral upper and lower extremities, dtr's 1+, no gross cognitive deficits. Cn exam grossly intact  Mood and affect are appropriate  General appears tired but otherwise no acute distress  Results for orders placed during the hospital encounter of 07/07/11 (from the past 48 hour(s))   GLUCOSE, CAPILLARY Status: Abnormal    Collection Time    07/16/11 11:41 AM   Component  Value  Range  Comment    Glucose-Capillary  130 (*)  70 - 99 (mg/dL)     Comment 1  Notify RN     GLUCOSE, CAPILLARY Status: Abnormal    Collection Time    07/16/11 5:17 PM   Component  Value  Range  Comment    Glucose-Capillary  184 (*)   70 - 99 (mg/dL)     Comment 1  Notify RN     GLUCOSE, CAPILLARY Status: Normal    Collection Time    07/16/11 9:16 PM   Component  Value  Range  Comment    Glucose-Capillary  93  70 - 99 (mg/dL)    BASIC METABOLIC PANEL Status: Abnormal    Collection Time    07/17/11 3:30 AM   Component  Value  Range  Comment    Sodium  132 (*)  135 - 145 (mEq/L)     Potassium  3.7  3.5 - 5.1 (mEq/L)     Chloride  96  96 - 112 (mEq/L)     CO2  32  19 - 32 (mEq/L)     Glucose, Bld  112 (*)  70 - 99 (mg/dL)     BUN  20  6 - 23 (mg/dL)     Creatinine, Ser  4.54  0.50 - 1.10 (mg/dL)     Calcium  8.4  8.4 - 10.5 (mg/dL)     GFR calc non Af Amer  49 (*)  >90 (mL/min)     GFR calc Af Amer  56 (*)  >90 (mL/min)    CBC Status: Abnormal    Collection Time    07/17/11 3:30 AM   Component  Value  Range  Comment    WBC  16.9 (*)  4.0 - 10.5 (K/uL)     RBC  3.14 (*)  3.87 - 5.11 (MIL/uL)     Hemoglobin  9.1 (*)  12.0 - 15.0 (g/dL)     HCT  09.8 (*)  11.9 - 46.0 (%)     MCV  90.1  78.0 - 100.0 (fL)     MCH  29.0  26.0 - 34.0 (pg)     MCHC  32.2  30.0 - 36.0 (g/dL)     RDW  14.7 (*)  82.9 - 15.5 (%)     Platelets  382  150 - 400 (K/uL)    GLUCOSE,  CAPILLARY Status: Abnormal    Collection Time    07/17/11 7:45 AM   Component  Value  Range  Comment    Glucose-Capillary  112 (*)  70 - 99 (mg/dL)     Comment 1  Documented in Chart      Comment 2  Notify RN     GLUCOSE, CAPILLARY Status: Abnormal    Collection Time    07/17/11 12:05 PM   Component  Value  Range  Comment    Glucose-Capillary  162 (*)  70 - 99 (mg/dL)     Comment 1  Documented in Chart      Comment 2  Notify RN     GLUCOSE, CAPILLARY Status: Abnormal    Collection Time    07/17/11 4:44 PM   Component  Value  Range  Comment    Glucose-Capillary  168 (*)  70 - 99 (mg/dL)    GLUCOSE, CAPILLARY Status: Abnormal    Collection Time    07/17/11 7:31 PM   Component  Value  Range  Comment    Glucose-Capillary  169 (*)  70 - 99 (mg/dL)     Comment  1  Documented in Chart      Comment 2  Notify RN     CBC Status: Abnormal    Collection Time    07/18/11 4:45 AM   Component  Value  Range  Comment    WBC  18.6 (*)  4.0 - 10.5 (K/uL)     RBC  3.25 (*)  3.87 - 5.11 (MIL/uL)     Hemoglobin  9.6 (*)  12.0 - 15.0 (g/dL)     HCT  24.4 (*)  01.0 - 46.0 (%)     MCV  89.8  78.0 - 100.0 (fL)     MCH  29.5  26.0 - 34.0 (pg)     MCHC  32.9  30.0 - 36.0 (g/dL)     RDW  27.2 (*)  53.6 - 15.5 (%)     Platelets  444 (*)  150 - 400 (K/uL)    BASIC METABOLIC PANEL Status: Abnormal    Collection Time    07/18/11 4:45 AM   Component  Value  Range  Comment    Sodium  134 (*)  135 - 145 (mEq/L)     Potassium  4.1  3.5 - 5.1 (mEq/L)     Chloride  98  96 - 112 (mEq/L)     CO2  29  19 - 32 (mEq/L)     Glucose, Bld  148 (*)  70 - 99 (mg/dL)     BUN  21  6 - 23 (mg/dL)     Creatinine, Ser  6.44  0.50 - 1.10 (mg/dL)     Calcium  8.8  8.4 - 10.5 (mg/dL)     GFR calc non Af Amer  47 (*)  >90 (mL/min)     GFR calc Af Amer  54 (*)  >90 (mL/min)     Dg Chest 2 View  07/17/2011 *RADIOLOGY REPORT* Clinical Data: Post CABG. Weakness, shortness of breath and cough. CHEST - 2 VIEW Comparison: 07/15/2011. Findings: Trachea is midline. Heart size stable. Sternotomy wires are unchanged in position. Right PICC tip projects over the SVC/RA junction. There is central pulmonary vascular congestion with small bilateral pleural effusions and bibasilar air space disease. Findings appear relatively stable from 07/15/2011. IMPRESSION: Central pulmonary vascular congestion with small bilateral pleural effusions and bibasilar air space disease, stable. Original Report  Authenticated By: Reyes Ivan, M.D.   Post Admission Physician Evaluation:  1. Functional deficits secondary to MI/CABG/VSD repair/ thymoma. 2. Patient is admitted to receive collaborative, interdisciplinary care between the physiatrist, rehab nursing staff, and therapy team. 3. Patient's level of medical  complexity and substantial therapy needs in context of that medical necessity cannot be provided at a lesser intensity of care such as a SNF. 4. Patient has experienced substantial functional loss from his/her baseline which was documented above under the "Functional History" and "Functional Status" headings. Judging by the patient's diagnosis, physical exam, and functional history, the patient has potential for functional progress which will result in measurable gains while on inpatient rehab. These gains will be of substantial and practical use upon discharge in facilitating mobility and self-care at the household level. 5. Physiatrist will provide 24 hour management of medical needs as well as oversight of the therapy plan/treatment and provide guidance as appropriate regarding the interaction of the two. 6. 24 hour rehab nursing will assist with bladder management, bowel management, safety, skin/wound care, disease management, medication administration, pain management and patient education and help integrate therapy concepts, techniques,education, etc. 7. PT will assess and treat for: Lower ext strength, rom, activity tolerance, gait, stamina. Goals are: supervision to mod I. 8. OT will assess and treat for: UES, ADL's, fxnl mobility, AET, safety. Goals are: supervision to mod I. 9. SLP will assess and treat for:  10. Case Management and Social Worker will assess and treat for psychological issues and discharge planning. 11. Team conference will be held weekly to assess progress toward goals and to determine barriers to discharge. 12. Patient will receive at least 3 hours of therapy per day at least 5 days per week. 13. ELOS and Prognosis: 1-2 weeks excellent Medical Problem List and Plan:  1. deconditioning following myocardial infarction. 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 Monitor for  deep vein thrombosis  3.. Pain Management: Ultram and tylenol as needed. Monitor with increased mobility  4. Post operative anemia. Patient has been transfused. Followup CBC in a.m.  5. Hypertension. Coreg 6.25 mg twice daily, Lasix 40 mg daily, Altace 2.5 mg daily. Monitor with increased activity  6. Hyperlipidemia. Crestor  7. History of bilateral total knee replacements.  8. FEN: nutritional intake poor. Consider appetite stimulant, RD f/u  Ivory Broad, MD 07/18/11 1700

## 2011-07-19 NOTE — Progress Notes (Signed)
Patient ID: [name], female   DOB: 1924/08/20, 78 y.o.   MRN: [id number] Subjective/Complaints: I am very weak. Review of Systems  Respiratory: Positive for cough and shortness of breath.   Neurological: Positive for focal weakness.  All other systems reviewed and are negative.   Objective: Vital Signs: Blood pressure 99/61, pulse 76, temperature 97.7 F (36.5 C), temperature source Oral, resp. rate 20, height 5\' 1"  (1.549 m), SpO2 92.00%. No results found. Results for orders placed during the hospital encounter of 07/18/11 (from the past 72 hour(s))  CBC     Status: Abnormal   Collection Time   07/19/11  5:30 AM      Component Value Range Comment   WBC 17.5 (*) 4.0 - 10.5 (K/uL)    RBC 3.19 (*) 3.87 - 5.11 (MIL/uL)    Hemoglobin 9.3 (*) 12.0 - 15.0 (g/dL)    HCT 40.9 (*) 81.1 - 46.0 (%)    MCV 89.3  78.0 - 100.0 (fL)    MCH 29.2  26.0 - 34.0 (pg)    MCHC 32.6  30.0 - 36.0 (g/dL)    RDW 91.4 (*) 78.2 - 15.5 (%)    Platelets 465 (*) 150 - 400 (K/uL)   COMPREHENSIVE METABOLIC PANEL     Status: Abnormal   Collection Time   07/19/11  5:30 AM      Component Value Range Comment   Sodium 135  135 - 145 (mEq/L)    Potassium 4.0  3.5 - 5.1 (mEq/L)    Chloride 99  96 - 112 (mEq/L)    CO2 26  19 - 32 (mEq/L)    Glucose, Bld 139 (*) 70 - 99 (mg/dL)    BUN 21  6 - 23 (mg/dL)    Creatinine, Ser 9.56  0.50 - 1.10 (mg/dL)    Calcium 8.8  8.4 - 10.5 (mg/dL)    Total Protein 5.4 (*) 6.0 - 8.3 (g/dL)    Albumin 2.4 (*) 3.5 - 5.2 (g/dL)    AST 19  0 - 37 (U/L)    ALT 41 (*) 0 - 35 (U/L)    Alkaline Phosphatase 70  39 - 117 (U/L)    Total Bilirubin 0.9  0.3 - 1.2 (mg/dL)    GFR calc non Af Amer 45 (*) >90 (mL/min)    GFR calc Af Amer 52 (*) >90 (mL/min)   DIFFERENTIAL     Status: Abnormal   Collection Time   07/19/11  5:30 AM      Component Value Range Comment   Neutrophils Relative 80 (*) 43 - 77 (%)    Neutro Abs 14.0 (*) 1.7 - 7.7 (K/uL)    Lymphocytes Relative 7 (*) 12 - 46  (%)    Lymphs Abs 1.2  0.7 - 4.0 (K/uL)    Monocytes Relative 7  3 - 12 (%)    Monocytes Absolute 1.3 (*) 0.1 - 1.0 (K/uL)    Eosinophils Relative 6 (*) 0 - 5 (%)    Eosinophils Absolute 1.0 (*) 0.0 - 0.7 (K/uL)    Basophils Relative 1  0 - 1 (%)    Basophils Absolute 0.1  0.0 - 0.1 (K/uL)    Physical Exam  Nursing note and vitals reviewed.  Constitutional: She is oriented to person, place, and time. She appears well-developed and well-nourished.  HENT:  Head: Normocephalic and atraumatic.  Eyes: Pupils are equal, round, and reactive to light. eomi  Neck: Normal range of motion. Neck supple.  Cardiovascular: Normal  rate and regular rhythm.  Pulmonary/Chest: Effort normal. She has decreased BS on right. She exhibits no tenderness.  Abdominal: Soft. Bowel sounds are normal.  Musculoskeletal: Normal range of motion.  Neurological: She is alert and oriented to person, place, and time.  Skin: Skin is warm and dry. Chest incision is clean and dry. Some redness along sacrum, bruising along left leg incision with healing wound. Psychiatric: She has a normal mood and affect but is fatigued. Her behavior is normal. Thought content normal.  motor strength is 4/5 in bilateral deltoid, biceps, triceps, and grip as well as hip flexion, knee extension, ankle dorsiflexion.  Sensation is intact to light touch in bilateral upper and lower extremities, dtr's 1+, no gross cognitive deficits. Cn exam grossly intact  Mood and affect are appropriate  General appears tired but otherwise no acute distress    Assessment/Plan: 1. Functional deficits secondary to deconditioning following MI and CABG which require 3+ hours per day of interdisciplinary therapy in a comprehensive inpatient rehab setting. Physiatrist is providing close team supervision and 24 hour management of active medical problems listed below. Physiatrist and rehab team continue to assess barriers to discharge/monitor patient progress toward  functional and medical goals. FIM:                   Comprehension Comprehension Mode: Auditory Comprehension: 5-Understands complex 90% of the time/Cues < 10% of the time  Expression Expression Mode: Verbal Expression: 5-Expresses basic needs/ideas: With no assist  Social Interaction Social Interaction: 7-Interacts appropriately with others - No medications needed.  Problem Solving Problem Solving: 5-Solves basic problems: With no assist  Memory Memory: 5-Recognizes or recalls 90% of the time/requires cueing < 10% of the time  2. Anticoagulation/DVT prophylaxis with Pharmaceutical: Lovenox 3. Pain Management: mainly for post op pain see below Medical Problem List and Plan:  1. deconditioning following myocardial infarction. 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 Monitor for deep vein thrombosis  3.. Pain Management: Ultram and tylenol as needed. Monitor with increased mobility  4. Post operative anemia. Patient has been transfused. Followup CBC in a.m.  5. Hypertension. Coreg 6.25 mg twice daily, Lasix 40 mg daily, Altace 2.5 mg daily. Monitor with increased activity  6. Hyperlipidemia. Crestor  7. History of bilateral total knee replacements.  8. FEN: nutritional intake poor. Consider appetite stimulant, RD f/u 9.  Thymoma-f/u XRT with Dr. Mitzi Hansen   LOS (Days) 1 A FACE TO FACE EVALUATION WAS PERFORMED  Braxten Memmer E 07/19/2011, 9:23 AM

## 2011-07-19 NOTE — Plan of Care (Signed)
Problem: RH Other (Specify) Goal: RH LTG Other (Specify) Pt will  Independently verbalize and return demonstrate at least 3 energy conservation strategies for selfcare tasks and meal prep.

## 2011-07-19 NOTE — Progress Notes (Signed)
INITIAL ADULT NUTRITION ASSESSMENT Date: 07/19/2011   Time: 10:57 AM  Reason for Assessment: Poor PO intake  ASSESSMENT: Female 78 y.o.  Dx: Physical deconditioning  Hx:  Past Medical History  Diagnosis Date  . Acute MI, anterior wall     a. 07/07/2011  . HTN (hypertension)   . Osteoarthritis of knee     Bilateral.  a. s/p LTKA ~2007;  b. s/p RTKA 10/2010  . CAD (coronary artery disease)     a. 07/07/2011 - Occluded LAD  . VSD (ventricular septal defect)     a.  07/07/2011 - Noted @ time of MI   Past Surgical History  Procedure Date  . Joint replacement   . Total knee arthroplasty     a.  Left ~ 2007, Right 10/2010  . Cataract extraction, bilateral   . Coronary artery bypass graft 07/07/2011    Procedure: CORONARY ARTERY BYPASS GRAFTING (CABG);  Surgeon: Kathlee Nations Suann Larry, MD;  Location: [facility] OR;  Service: Open Heart Surgery;  Laterality: N/A;  times two using greater saphenous vein harvested via endovascular vein harvest  . Vsd repair 07/07/2011    Procedure: VENTRICULAR SEPTAL DEFECT (VSD) REPAIR;  Surgeon: Mikey Bussing, MD;  Location: [facility] OR;  Service: Open Heart Surgery;  Laterality: N/A;  with bovine pericardium 4cm x 4cm  . Mass excision 07/07/2011    Procedure: EXCISION MASS;  Surgeon: Kathlee Nations Suann Larry, MD;  Location: [facility] OR;  Service: Open Heart Surgery;  Laterality: N/A;  Excision of mediastinal mass   Related Meds:     . aspirin EC  325 mg Oral Daily  . carvedilol  6.25 mg Oral BID WC  . enoxaparin (LOVENOX) injection  40 mg Subcutaneous Daily  . feeding supplement  237 mL Oral TID WC  . furosemide  40 mg Oral Daily  . pantoprazole  40 mg Oral Q1200  . potassium chloride  20 mEq Oral Daily  . ramipril  2.5 mg Oral Daily  . rosuvastatin  5 mg Oral q1800  . senna  1 tablet Oral BID   Ht: 5\' 1"  (154.9 cm)  Wt:  144 lb 2.9 oz (65.4 kg)  Ideal Wt: 47.7 kg % Ideal Wt: 137%  Usual Wt: 67.3 kg (per pt) % Usual Wt: 97%  BMI 27.3, pt overweight.  Food/Nutrition  Related Hx: pt with poor intake since hospitalization  Labs:  CMP     Component Value Date/Time   NA 135 07/19/2011 0530   K 4.0 07/19/2011 0530   CL 99 07/19/2011 0530   CO2 26 07/19/2011 0530   GLUCOSE 139* 07/19/2011 0530   BUN 21 07/19/2011 0530   CREATININE 1.09 07/19/2011 0530   CALCIUM 8.8 07/19/2011 0530   PROT 5.4* 07/19/2011 0530   ALBUMIN 2.4* 07/19/2011 0530   AST 19 07/19/2011 0530   ALT 41* 07/19/2011 0530   ALKPHOS 70 07/19/2011 0530   BILITOT 0.9 07/19/2011 0530   GFRNONAA 45* 07/19/2011 0530   GFRAA 52* 07/19/2011 0530   CBG (last 3)   Basename 07/17/11 1931 07/17/11 1644 07/17/11 1205  GLUCAP 169* 168* 162*   Lab Results  Component Value Date   HGBA1C 6.0* 07/07/2011   Intake/Output:   Total I/O In: 240 [P.O.:240] Out: -    Diet Order: Carb Control  Supplements/Tube Feeding: Ensure Clinical Strength PO TID  IVF:    Estimated Nutritional Needs:   Kcal:  1500 - 1700 kcal Protein:  80 - 90 grams Fluid:  1.5 - 1.7 L/d  Admitted to inpatient rehab for deconditioning s/p acute anterior STEMI. Pathology pending-likely malignant thymoma with planned followup radiation oncology as an outpatient. Pt followed by RD during acute hospitalization. Pt was eating minimally and consuming Chocolate Ensure Clinical Strength PO TID. Pt with stage I pressure ulcer. Currently eating <25% of meals. Per MD H&P, pt with poor intake and considering appetite stimulant.  This RD spoke with patient regarding PO intake. Pt states that she has no appetite, eating bites of a few foods, such as yogurt and applesauce. Pt requesting to continue these types of foods. Offered additional PO choices but pt politely declined. Stating that she is drinking 2 Chocolate Ensures daily.  NUTRITION DIAGNOSIS: -Inadequate oral intake (NI-2.1).  Status: Ongoing  RELATED TO: poor appetite  AS EVIDENCE BY: poor meal completion  MONITORING/EVALUATION(Goals): Goal: Pt to consume >/= 75% of meals and  supplements. Monitor: PO intake, weights, labs, I/O's  EDUCATION NEEDS: -Education Needs Addressed  INTERVENTION: 1. RD to report food preferences to nutrition ambassador, add yogurt and applesauce to trays. Continue Ensure supplements. 3. RD to follow nutrition care plan  Dietitian #: [phone]  DOCUMENTATION CODES Per approved criteria  -Not Applicable    Adair Laundry 07/19/2011, 10:57 AM

## 2011-07-19 NOTE — Plan of Care (Signed)
Overall Plan of Care [facility]) Patient Details Name: [name] MRN: [id number] DOB: [birth year]  Diagnosis:  Rehabilitation for Deconditioning  Primary Diagnosis:    Physical deconditioning Co-morbidities: Coronary artery disease status post recent myocardial infarction and coronary artery bypass grafting. thymoma  Functional Problem List  Patient demonstrates impairments in the following areas: Bowel, Medication Management and Pain  Basic ADL's: grooming, bathing, dressing and toileting Advanced ADL's: simple meal preparation  Transfers:  bed mobility, bed to chair, toilet, tub/shower, car and furniture Locomotion:  ambulation, wheelchair mobility and stairs  Additional Impairments:  Other Pt has sternal precautions which limit the use of UEs with transfers.  Anticipated Outcomes Item Anticipated Outcome  Eating/Swallowing  Independent  Basic self-care  Supervision   Tolieting  Supervision  Bowel/Bladder  Mod independent, remain continent  Transfers  Min assist  Locomotion  Modified independent  Communication    Cognition    Pain  < 3 1-10 scale  Safety/Judgment  supervision  Other  Skin: mod assist with skin/wound care, turning, skin breakdown prevention   Therapy Plan:  PT: 2-3 visits/day, 30-60 min, 5 of 7 days    OT: 1-2 visits/day, 30-6- min, 5 of 7 days  Team Interventions: Item RN PT OT SLP SW TR Other  Self Care/Advanced ADL Retraining  x x      Neuromuscular Re-Education         Therapeutic Activities  x x      UE/LE Strength Training/ROM         UE/LE Coordination Activities         Visual/Perceptual Remediation/Compensation         DME/Adaptive Equipment Instruction  x x      Therapeutic Exercise  x x      Balance/Vestibular Training  x x      Patient/Family Education  x x      Cognitive Remediation/Compensation         Functional Mobility Training  x x      Ambulation/Gait Training  x       Museum/gallery curator  x       Wheelchair  Propulsion/Positioning  x       Functional Tourist information centre manager Reintegration  x x      Dysphagia/Aspiration Film/video editor         Bladder Management x        Bowel Management x        Disease Management/Prevention x        Pain Management x x x      Medication Management x        Skin Care/Wound Management x        Splinting/Orthotics         Discharge Planning  x x      Psychosocial Support  x                          Team Discharge Planning: Destination:  Home Projected Follow-up:  PT and Home Health, OT home health Projected Equipment Needs:  TBD, owns RW Patient/family involved in discharge planning:  Yes  MD ELOS: 2 weeks Medical Rehab Prognosis:  Good Assessment: Severely deconditioned 78 year old female status post C. ABG now requiring CIR level PT, OT, 24 7 rehabilitation nursing and rehabilitation M.D.

## 2011-07-19 NOTE — Progress Notes (Signed)
Patient information reviewed and entered into [facility] system by Keiry Kowal, RN, CRRN, PPS Coordinator.  Information including medical coding and functional independence measure will be reviewed and updated through discharge.     Per nursing patient was given "Data Collection Information Summary for Patients in Inpatient Rehabilitation Facilities with attached "Privacy Act Statement-Health Care Records" upon admission.   

## 2011-07-19 NOTE — Progress Notes (Signed)
   CARE MANAGEMENT NOTE 07/19/2011  Patient:  [name], [name]   Account Number:  [id number]  Date Initiated:  07/10/2011  Documentation initiated by:  [facility]  Subjective/Objective Assessment:   STEMI and emergent CABGon 07-07-11.  has spouse.     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  HAS HX OF BEING AT [facility] FOR REHAB IN THE PAST.   Anticipated DC Date:  07/14/2011   Anticipated DC Plan:  HOME W HOME HEALTH SERVICES  In-house referral  Clinical Social Worker      DC Planning Services  CM consult      Choice offered to / List presented to:             Status of service:  Completed, signed off Medicare Important Message given?   (If response is "NO", the following Medicare IM given date fields will be blank) Date Medicare IM given:   Date Additional Medicare IM given:    Discharge Disposition:  IP REHAB FACILITY  Per UR Regulation:  Reviewed for med. necessity/level of care/duration of stay  Comments:  07/18/11 Misty Freda,RN,BSN PT EVALUATED FOR AND ACCEPTED TO [facility].  TRANSFERRING TO 4000 TODAY. Phone #[phone]

## 2011-07-19 NOTE — Progress Notes (Signed)
Occupational Therapy Session Note  Patient Details  Name: NYOKA ALCOSER MRN: [id number] Date of Birth: [birth year]  Today's Date: 07/19/2011 Time: 2130-8657 Time Calculation (min): 26 min  Short Term Goals: Week 1:  OT Short Term Goal 1 (Week 1): Pt will tolerate standin at the sink during grooming tasks for 5 mins 3 consecutive sessions. OT Short Term Goal 2 (Week 1): Pt will perform LB bathing with min assist using AE 3 consecutive sessions. OT Short Term Goal 3 (Week 1): Pt will perform LB dressing with min assist using AE 3 consecutive sessions. OT Short Term Goal 4 (Week 1): Pt will perform toileting and toilet transfer with close supervision, using RW and 3:1  Skilled Therapeutic Interventions/Progress Updates:  Worked on endurance, sit to stand following sternal precautions for sit to stand from bedside chair.  Pt practiced toilet transfer with min assist from bedside chair to 3:1 over the toilet with min assist.  Pt needs mod instructional cues to refrain from attempting to push up from surface or reach back when attempting sit to stand transitions.  Easily fatigues with minimal activity.   Second Session:  Time 14:00-14:33 33 mins   Skilled Therapeutic Interventions/Progress Updates: Worked on standing endurance at the sink for 5 mins while performing 2 grooming tasks.  Oxygen SATs remained at 97 % or better on room air throughout session.  Also worked on cervical extension in sitting with stretching and place and hold against resistance.  Finished with sit to stand repetitions from bedside chair without use of the UEs.  Pt needed mod assist overall    Therapy Documentation Precautions:  Precautions Precautions: Fall;Sternal Required Braces or Orthoses: No Restrictions Weight Bearing Restrictions: No   Vital Signs: Therapy Vitals Pulse Rate: 89  Patient Position, if appropriate: Sitting Oxygen Therapy SpO2: 96 % O2 Device: None (Room air) Pulse Oximetry Type:  Intermittent Pain: Pain Assessment Pain Assessment: 0-10 Pain Score:   3 Pain Type: Acute pain Pain Location: Buttocks Pain Orientation: Mid Pain Descriptors: Sore Pain Onset: Gradual Patients Stated Pain Goal: 0 Pain Intervention(s): Repositioned Multiple Pain Sites: No ADL: See FIM for current functional status  Therapy/Group: Individual Therapy  Kelcey Wickstrom OTR/L 07/19/2011, 12:46 PM

## 2011-07-19 NOTE — Progress Notes (Signed)
Social Work Assessment and Plan Assessment and Plan  Patient Name: [name]  ZOXWR'U Date: 07/19/2011  Problem List:  Patient Active Problem List  Diagnoses  . Acute anterior wall MI  . HTN (hypertension)  . Osteoarthritis of knee  . CAD (coronary artery disease)  . VSD (ventricular septal defect)  . Physical deconditioning    Past Medical History:  Past Medical History  Diagnosis Date  . Acute MI, anterior wall     a. 07/07/2011  . HTN (hypertension)   . Osteoarthritis of knee     Bilateral.  a. s/p LTKA ~2007;  b. s/p RTKA 10/2010  . CAD (coronary artery disease)     a. 07/07/2011 - Occluded LAD  . VSD (ventricular septal defect)     a.  07/07/2011 - Noted @ time of MI    Past Surgical History:  Past Surgical History  Procedure Date  . Joint replacement   . Total knee arthroplasty     a.  Left ~ 2007, Right 10/2010  . Cataract extraction, bilateral   . Coronary artery bypass graft 07/07/2011    Procedure: CORONARY ARTERY BYPASS GRAFTING (CABG);  Surgeon: Kathlee Nations Suann Larry, MD;  Location: [facility] OR;  Service: Open Heart Surgery;  Laterality: N/A;  times two using greater saphenous vein harvested via endovascular vein harvest  . Vsd repair 07/07/2011    Procedure: VENTRICULAR SEPTAL DEFECT (VSD) REPAIR;  Surgeon: Mikey Bussing, MD;  Location: [facility] OR;  Service: Open Heart Surgery;  Laterality: N/A;  with bovine pericardium 4cm x 4cm  . 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  She reports being very tired and lacks energy.  Exhausted self taking care of husband at home prior to admission. Recent Psychosocial Issues: Other medical issues-providing care for husband with parkinson's at home Pyschiatric History: No history-Deferred Beck Depression Screen due to coping appropriately with diagnosis.  She reports: " I will do what I can."  Patient/Family Perceptions, Expectations & Goals Pt/Family Perceptions, Expectations and Goals Pt/Family understanding of illness & functional limitations: Pt and daughter have a good understanding of her condition and issues.  Main issue is the care of pt's husband at home now pt is hospitalized.  Daughter assisting with this. 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 Pt/family expectations/goals: Pt states: " I want to be able to do for myself, unsure about the rest. "  I guess it will fall into place."  She is grateful to have her daughter local and son was here but has since left to go  back to [place].  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  Need to access her LTC policy to assist with the cost of care at home for both.    Clinical Impression:  Pleasant female sitting up in a chair reporting how tired she is and how she is worried about husband at home.  She reports his care was too much. Daughter aware pt and husband will both need 24 hour care and pt can not resume providing care to husband at home once discharged.  Follow up on pt's long term care Policy.      [name] 07/19/2011

## 2011-07-19 NOTE — Evaluation (Signed)
Occupational Therapy Assessment and Plan  Patient Details  Name: [name] MRN: [id number] Date of Birth: 1925-06-01  OT Diagnosis: acute pain and muscle weakness (generalized) Rehab Potential: Rehab Potential: Good ELOS:  12 to 14 days Today's Date: 07/19/2011 Time: 6962-9528 Time Calculation (min): 55 min  Problem List:  Patient Active Problem List  Diagnoses  . Acute anterior wall MI  . HTN (hypertension)  . Osteoarthritis of knee  . CAD (coronary artery disease)  . VSD (ventricular septal defect)  . Physical deconditioning    Past Medical History:  Past Medical History  Diagnosis Date  . Acute MI, anterior wall     a. 07/07/2011  . HTN (hypertension)   . Osteoarthritis of knee     Bilateral.  a. s/p LTKA ~2007;  b. s/p RTKA 10/2010  . CAD (coronary artery disease)     a. 07/07/2011 - Occluded LAD  . VSD (ventricular septal defect)     a.  07/07/2011 - Noted @ time of MI   Past Surgical History:  Past Surgical History  Procedure Date  . Joint replacement   . Total knee arthroplasty     a.  Left ~ 2007, Right 10/2010  . Cataract extraction, bilateral   . Coronary artery bypass graft 07/07/2011    Procedure: CORONARY ARTERY BYPASS GRAFTING (CABG);  Surgeon: Kathlee Nations Suann Larry, MD;  Location: [facility] OR;  Service: Open Heart Surgery;  Laterality: N/A;  times two using greater saphenous vein harvested via endovascular vein harvest  . Vsd repair 07/07/2011    Procedure: VENTRICULAR SEPTAL DEFECT (VSD) REPAIR;  Surgeon: Mikey Bussing, MD;  Location: [facility] OR;  Service: Open Heart Surgery;  Laterality: N/A;  with bovine pericardium 4cm x 4cm  . Mass excision 07/07/2011    Procedure: EXCISION MASS;  Surgeon: Kathlee Nations Suann Larry, MD;  Location: [facility] OR;  Service: Open Heart Surgery;  Laterality: N/A;  Excision of mediastinal mass    Assessment & Plan Clinical Impression: Patient is a 78 y.o. year old female with recent admission to the hospital on 07/07/11 with MI and subsequently  CABG X 2.  Patient transferred to CIR on 07/18/2011 .    Patient currently requires mod with basic self-care skills secondary to muscle weakness.  Prior to hospitalization, patient could complete ADLs and IADLs with modified independent with use of approriate DME..  Patient will benefit from skilled intervention to increase independence with basic self-care skills prior to discharge home with apouse and arranged 24 hour supervision.  Anticipate patient will require 24 hour supervision and follow up home health.  OT - End of Session Activity Tolerance: Tolerates 10 - 20 min activity with multiple rests Endurance Deficit: Yes Endurance Deficit Description: Pt needs multiple rest breaks in sitting after performing standing tasks. OT Assessment Rehab Potential: Good Barriers to Discharge: Decreased caregiver support OT Plan OT Frequency: 1-2 X/day, 60-90 minutes OT Treatment/Interventions: Balance/vestibular training;Community reintegration;DME/adaptive equipment instruction;Patient/family education;Pain management;Functional mobility training;Self Care/advanced ADL retraining;Therapeutic Activities;UE/LE Strength taining/ROM OT Recommendation Follow Up Recommendations: Home health OT;24 hour supervision/assistance Equipment Recommended: Other (comment) (TBA by OT)  OT Evaluation Precautions/Restrictions  Precautions Precautions: Fall;Sternal Required Braces or Orthoses: No Restrictions Weight Bearing Restrictions: No General   Vital Signs Therapy Vitals Pulse Rate: 82  Patient Position, if appropriate: Sitting Oxygen Therapy SpO2: 97 % O2 Device: None (Room air) Pulse Oximetry Type: Intermittent Pain Pain Assessment Pain Assessment: 0-10 Pain Score:   3 Pain Type: Acute pain Pain Location: Buttocks Pain  Orientation: Mid Pain Descriptors: Sore Pain Onset: Gradual Patients Stated Pain Goal: 0 Pain Intervention(s): Repositioned Multiple Pain Sites: No Home Living/Prior  Functioning Home Living Lives With: Spouse;Other (Comment) (husband has advanced Parkinson's Disease) Receives Help From: Family;Personal care attendant (has hired assist from Engineer, agricultural for husband) Home Layout: One level;Other (Comment) Home Access: Level entry Bathroom Shower/Tub: Tub/shower unit;Curtain Bathroom Toilet: Standard Bathroom Accessibility: Yes How Accessible: Accessible via walker Home Adaptive Equipment: Walker - rolling Prior Function Level of Independence: Independent with basic ADLs;Independent with homemaking with ambulation;Independent with gait;Independent with transfers Able to Take Stairs?: Yes Driving: Yes Vocation: Retired Comments: 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 Per patient, she has increasd number of hours for caregiver for her husband to include days as well. ADL   Vision/Perception  Vision - History Baseline Vision: Wears glasses all the time Patient Visual Report: No change from baseline Vision - Assessment Vision Assessment: Vision not tested Perception Perception: Within Functional Limits Praxis Praxis: Intact  Cognition Overall Cognitive Status: Appears within functional limits for tasks assessed Arousal/Alertness: Awake/alert Orientation Level: Oriented X4 Attention: Sustained Sustained Attention: Appears intact Memory: Appears intact Awareness: Appears intact Problem Solving: Appears intact Sensation Sensation Light Touch: Appears Intact Stereognosis: Appears Intact Hot/Cold: Appears Intact Proprioception: Appears Intact Additional Comments: symmetrical, no numbness or tingling Coordination Gross Motor Movements are Fluid and Coordinated: Yes Fine Motor Movements are Fluid and Coordinated: Yes Motor  Motor Motor: Within Functional Limits Motor - Skilled Clinical Observations: decreased LE power, eccentric control, and  muscular endurance Mobility  Bed Mobility Bed Mobility: No Transfers Transfers: Yes Sit to Stand: Without upper extremity assist;From chair/3-in-1;3: Mod assist Sit to Stand Details: Verbal cues for precautions/safety Sit to Stand Details (indicate cue type and reason): mod instructional cues to refrain from using UEs Stand to Sit: Without upper extremity assist;3: Mod assist Stand to Sit Details (indicate cue type and reason): Verbal cues for precautions/safety Stand to Sit Details: mod instructional cues to refrain from reaching back to transferring surface.  Trunk/Postural Assessment  Cervical Assessment Cervical Assessment: Exceptions to [facility] Cervical Strength Overall Cervical Strength Comments: Pt tends to keep her head flexed in sitting.  Able to initiate active extension to neutral position but has difficulty maintaining it.   Thoracic Assessment Thoracic Assessment: Exceptions to [facility] (increased thoracic kyphosis) Lumbar Assessment Lumbar Assessment: Within Functional Limits Postural Control Postural Control: Within Functional Limits  Balance Balance Balance Assessed: Yes Static Sitting Balance Static Sitting - Balance Support: No upper extremity supported;Feet supported Static Sitting - Level of Assistance: 7: Independent Dynamic Sitting Balance Dynamic Sitting - Balance Support: No upper extremity supported;Feet supported;During functional activity Dynamic Sitting - Level of Assistance: 5: Stand by assistance Static Standing Balance Static Standing - Balance Support: Bilateral upper extremity supported Static Standing - Level of Assistance: 4: Min assist Dynamic Standing Balance Dynamic Standing - Balance Support: Bilateral upper extremity supported Dynamic Standing - Level of Assistance: 4: Min assist Extremity/Trunk Assessment RUE Assessment RUE Assessment: Within Functional Limits RUE AROM (degrees) RUE Overall AROM Comments: Did not assess shoulder AROM more than  80 degrees secondary to sternal precautions.  Pt reports no difficulty with shoulder movements prior to this admission.  Grip strength 3+/5 LUE Assessment LUE Assessment: Within Functional Limits LUE AROM (degrees) LUE Overall AROM Comments: Did not assess shoulder AROM more than 80 degrees secondary to sternal precautions.  Pt reports no difficulty with shoulder movements  prior to this admission.  Grip strength 3+/5  See FIM for current functional status Refer to Care Plan for Long Term Goals  Recommendations for other services: None  Discharge Criteria: Patient will be discharged from OT if patient refuses treatment 3 consecutive times without medical reason, if treatment goals not met, if there is a change in medical status, if patient makes no progress towards goals or if patient is discharged from hospital.  The above assessment, treatment plan, treatment alternatives and goals were discussed and mutually agreed upon: by patient  Ronnetta Currington OTR/L 07/19/2011, 12:27 PM

## 2011-07-19 NOTE — Progress Notes (Signed)
Physical Therapy Session Note  Patient Details  Name: [name] MRN: [id number] Date of Birth: 1924-12-25  Today's Date: 07/19/2011 Time: 1478-2956 Time Calculation (min): 27 min  Short Term Goals: LTGs=STGs  Skilled Therapeutic Interventions/Progress Updates:    Transfer and gait training for strengthening  Therapy Documentation Precautions:  Precautions Precautions: Sternal;Fall Required Braces or Orthoses: No Restrictions Weight Bearing Restrictions: No General: Amount of Missed PT Time (min): 15 Minutes Missed Time Reason: Patient fatigue Response to Previous Treatment: Patient reporting fatigue but able to participate. Vital Signs: Therapy Vitals Pulse Rate: 83  (EHR=91) Pain: Pain Assessment Pain Assessment: No/denies pain Pain Score:   3 Mobility: Bed Mobility Bed Mobility: Yes (sit to supine = mod@) Transfers Sit to Stand: 3: Mod assist;Other (comment) (progressed to min@ during session) Sit to Stand Details: Manual facilitation for weight shifting Sit to Stand Details (indicate cue type and reason): lifting assist needed because pt has sternal precautions Stand to Sit: 4: Min assist Locomotion : Ambulation Ambulation/Gait Assistance: 4: Min assist Ambulation Distance (Feet): 40 Feet (x 2) Assistive device: Rolling walker Ambulation/Gait Assistance Details (indicate cue type and reason): decreased speed, tends to lean forward and needs verbal cues to stand up straight.   See FIM for current functional status  Therapy/Group: Individual Therapy  Georges Mouse 07/19/2011, 4:24 PM

## 2011-07-19 NOTE — Progress Notes (Signed)
Inpatient Rehabilitation Center Individual Statement of Services  Patient Name:  [name]  Date:  07/19/2011  Welcome to the Inpatient Rehabilitation Center.  Our goal is to provide you with an individualized program based on your diagnosis and situation, designed to meet your specific needs.  With this comprehensive rehabilitation program, you will be expected to participate in at least 3 hours of rehabilitation therapies Monday-Friday, with modified therapy programming on the weekends.  Your rehabilitation program will include the following services:  Physical Therapy (PT), Occupational Therapy (OT), Speech Therapy (ST), 24 hour per day rehabilitation nursing, Therapeutic Recreaction (TR), Case Management (RN and Child psychotherapist), Rehabilitation Medicine, Nutrition Services and Pharmacy Services  Weekly team conferences will be held on Wednesdays to discuss your progress.  Your RN Case Designer, television/film set will talk with you frequently to get your input and to update you on team discussions.  Team conferences with you and your family in attendance may also be held.  Expected length of stay: 2 weeks  Overall anticipated outcome: Minimum assistance - supervision  Depending on your progress and recovery, your program may change.  Your RN Case Estate agent will coordinate services and will keep you informed of any changes.  Your RN Sports coach and SW names and contact numbers are listed  below.  The following services may also be recommended but are not provided by the Inpatient Rehabilitation Center:   Driving Evaluations  Home Health Rehabiltiation Services  Outpatient Rehabilitatation [facility]  Vocational Rehabilitation   Arrangements will be made to provide these services after discharge if needed.  Arrangements include referral to agencies that provide these services.  Your insurance has been verified to be:  [employer] Your primary doctor is:    Pertinent  information will be shared with your doctor and your insurance company.  Case Manager: Lutricia Horsfall, [facility] [phone]  Social Worker:  Dossie Der, [place] [phone]  Information discussed with and copy given to patient by: Meryl Dare, 07/19/2011

## 2011-07-20 DIAGNOSIS — I2489 Other forms of acute ischemic heart disease: Secondary | ICD-10-CM

## 2011-07-20 DIAGNOSIS — I248 Other forms of acute ischemic heart disease: Secondary | ICD-10-CM

## 2011-07-20 DIAGNOSIS — I2510 Atherosclerotic heart disease of native coronary artery without angina pectoris: Secondary | ICD-10-CM

## 2011-07-20 DIAGNOSIS — Z5189 Encounter for other specified aftercare: Secondary | ICD-10-CM

## 2011-07-20 DIAGNOSIS — R5381 Other malaise: Secondary | ICD-10-CM

## 2011-07-20 NOTE — Progress Notes (Signed)
Physical Therapy Session Note  Patient Details  Name: VERLIN DUKE MRN: [id number] Date of Birth: 04-13-1925  Today's Date: 07/20/2011 Time: 7829-5621 Time Calculation (min): 17 min   Skilled Therapeutic Interventions/Progress Updates:     Therapy Documentation Precautions:  Precautions Precautions: Sternal;Fall Required Braces or Orthoses: No Restrictions Weight Bearing Restrictions: No Other Position/Activity Restrictions: monitor O2 during mobility General: Chart Reviewed: Yes Amount of Missed PT Time (min): 40 Minutes (declined due to fatigue) Family/Caregiver Present: No   Pain: Pain Assessment Pain Assessment: No/denies pain Pain Score: 0-No pain    Treatments:  Patient sleeping in recliner upon entry, when awoke by PT patient stated, "what do we have to do?" Education provided re: role of PT and benefits of mobility. Patient stated she just didn't feel up to it this morning due to not resting well last night and receiving medicine at 4am that is currently making her feel very groggy and sleepy. She also stated that she didn't feel safe to get up being as tired as she is. Encouragement provided, however patient continued to decline mobility. Continued education re: deep breathing and use of incentive spirometer while in room to maintain good oxygen saturation and maintain chest wall mobility. Patient verbalized and demonstrated understanding with min cues of PT before stating she was too tired to anything more. Will follow up this pm as able.  Patient missed 20 min skilled PT.  See FIM for current functional status  Therapy/Group: Individual Therapy  Romeo Rabon 07/20/2011, 9:44 AM

## 2011-07-20 NOTE — Progress Notes (Signed)
Patient ID: [name], female   DOB: 1924/09/22, 78 y.o.   MRN: [id number] Subjective/Complaints: I am very weak. No SOB in bed Review of Systems  Respiratory: Positive for cough and shortness of breath.   Neurological: Positive for focal weakness.  All other systems reviewed and are negative.   Objective: Vital Signs: Blood pressure 115/54, pulse 91, temperature 98.6 F (37 C), temperature source Oral, resp. rate 18, height 5\' 1"  (1.549 m), weight 64.7 kg (142 lb 10.2 oz), SpO2 90.00%. Dg Chest 2 View  07/19/2011  *RADIOLOGY REPORT*  Clinical Data: Decreased breath sounds, status post CABG  CHEST - 2 VIEW  Comparison: 07/17/2011  Findings: There is a right arm PICC line with tip in the cavoatrial junction.  The heart size appears normal.  There are bilateral pleural effusions and interstitial edema.  Atelectasis within the right midlung and right upper lobe is noted.  IMPRESSION:  1.  Moderate CHF. 2.  Right lung atelectasis.  Original Report Authenticated By: Rosealee Albee, M.D.   Results for orders placed during the hospital encounter of 07/18/11 (from the past 72 hour(s))  CBC     Status: Abnormal   Collection Time   07/19/11  5:30 AM      Component Value Range Comment   WBC 17.5 (*) 4.0 - 10.5 (K/uL)    RBC 3.19 (*) 3.87 - 5.11 (MIL/uL)    Hemoglobin 9.3 (*) 12.0 - 15.0 (g/dL)    HCT 40.9 (*) 81.1 - 46.0 (%)    MCV 89.3  78.0 - 100.0 (fL)    MCH 29.2  26.0 - 34.0 (pg)    MCHC 32.6  30.0 - 36.0 (g/dL)    RDW 91.4 (*) 78.2 - 15.5 (%)    Platelets 465 (*) 150 - 400 (K/uL)   COMPREHENSIVE METABOLIC PANEL     Status: Abnormal   Collection Time   07/19/11  5:30 AM      Component Value Range Comment   Sodium 135  135 - 145 (mEq/L)    Potassium 4.0  3.5 - 5.1 (mEq/L)    Chloride 99  96 - 112 (mEq/L)    CO2 26  19 - 32 (mEq/L)    Glucose, Bld 139 (*) 70 - 99 (mg/dL)    BUN 21  6 - 23 (mg/dL)    Creatinine, Ser 9.56  0.50 - 1.10 (mg/dL)    Calcium 8.8  8.4 - 10.5 (mg/dL)    Total Protein 5.4 (*) 6.0 - 8.3 (g/dL)    Albumin 2.4 (*) 3.5 - 5.2 (g/dL)    AST 19  0 - 37 (U/L)    ALT 41 (*) 0 - 35 (U/L)    Alkaline Phosphatase 70  39 - 117 (U/L)    Total Bilirubin 0.9  0.3 - 1.2 (mg/dL)    GFR calc non Af Amer 45 (*) >90 (mL/min)    GFR calc Af Amer 52 (*) >90 (mL/min)   DIFFERENTIAL     Status: Abnormal   Collection Time   07/19/11  5:30 AM      Component Value Range Comment   Neutrophils Relative 80 (*) 43 - 77 (%)    Neutro Abs 14.0 (*) 1.7 - 7.7 (K/uL)    Lymphocytes Relative 7 (*) 12 - 46 (%)    Lymphs Abs 1.2  0.7 - 4.0 (K/uL)    Monocytes Relative 7  3 - 12 (%)    Monocytes Absolute 1.3 (*) 0.1 - 1.0 (K/uL)  Eosinophils Relative 6 (*) 0 - 5 (%)    Eosinophils Absolute 1.0 (*) 0.0 - 0.7 (K/uL)    Basophils Relative 1  0 - 1 (%)    Basophils Absolute 0.1  0.0 - 0.1 (K/uL)   URINALYSIS, ROUTINE W REFLEX MICROSCOPIC     Status: Abnormal   Collection Time   07/19/11  5:55 PM      Component Value Range Comment   Color, Urine YELLOW  YELLOW     APPearance CLEAR  CLEAR     Specific Gravity, Urine 1.015  1.005 - 1.030     pH 7.0  5.0 - 8.0     Glucose, UA NEGATIVE  NEGATIVE (mg/dL)    Hgb urine dipstick NEGATIVE  NEGATIVE     Bilirubin Urine NEGATIVE  NEGATIVE     Ketones, ur NEGATIVE  NEGATIVE (mg/dL)    Protein, ur NEGATIVE  NEGATIVE (mg/dL)    Urobilinogen, UA 1.0  0.0 - 1.0 (mg/dL)    Nitrite NEGATIVE  NEGATIVE     Leukocytes, UA SMALL (*) NEGATIVE    URINE MICROSCOPIC-ADD ON     Status: Abnormal   Collection Time   07/19/11  5:55 PM      Component Value Range Comment   Squamous Epithelial / LPF MANY (*) RARE     WBC, UA 3-6  <3 (WBC/hpf)    Bacteria, UA RARE  RARE     Physical Exam  Nursing note and vitals reviewed.  Constitutional: She is oriented to person, place, and time. She appears well-developed and well-nourished.  HENT:  Head: Normocephalic and atraumatic.  Eyes: Pupils are equal, round, and reactive to light. eomi  Neck: Normal  range of motion. Neck supple.  Cardiovascular: Normal rate and regular rhythm.  Pulmonary/Chest: Effort normal. She has slightly decreased BS on right but improved. She exhibits no tenderness.  Abdominal: Soft. Bowel sounds are normal.  Musculoskeletal: Normal range of motion.  Neurological: She is alert and oriented to person, place, and time.  Skin: Skin is warm and dry. Chest incision is clean and dry. Some redness along sacrum, bruising along left leg incision with healing wound. Psychiatric: She has a normal mood and affect but is fatigued. Her behavior is normal. Thought content normal.  motor strength is 4/5 in bilateral deltoid, biceps, triceps, and grip as well as hip flexion, knee extension, ankle dorsiflexion.  Sensation is intact to light touch in bilateral upper and lower extremities, dtr's 1+, no gross cognitive deficits. Cn exam grossly intact  Mood and affect are appropriate  General appears tired but otherwise no acute distress    Assessment/Plan: 1. Functional deficits secondary to deconditioning following MI and CABG which require 3+ hours per day of interdisciplinary therapy in a comprehensive inpatient rehab setting. Physiatrist is providing close team supervision and 24 hour management of active medical problems listed below. Physiatrist and rehab team continue to assess barriers to discharge/monitor patient progress toward functional and medical goals. FIM: FIM - Bathing Bathing Steps Patient Completed: Chest;Right Arm;Left Arm;Abdomen;Right upper leg;Left upper leg Bathing: 3: Mod-Patient completes 5-7 71f 10 parts or 50-74%  FIM - Upper Body Dressing/Undressing Upper body dressing/undressing: 0: Wears gown/pajamas-no public clothing FIM - Lower Body Dressing/Undressing Lower body dressing/undressing: 1: Total-Patient completed less than 25% of tasks (Pt wore gripper socks and TEDs)  FIM - Toileting Toileting Assistive Devices: Toilet  Aid/prosthesis/orthosis Toileting: 1: Total-Patient completed zero steps, helper did all 3  FIM - Diplomatic Services operational officer Devices: Bedside  commode Toilet Transfers: 4-To toilet/BSC: Min A (steadying Pt. > 75%)  FIM - Bed/Chair Transfer Bed/Chair Transfer Assistive Devices: Therapist, occupational: 3: Chair or W/C > Bed: Mod A (lift or lower assist)  FIM - Locomotion: Wheelchair Locomotion: Wheelchair: 0: Activity did not occur FIM - Locomotion: Ambulation Locomotion: Ambulation Assistive Devices: Designer, industrial/product Ambulation/Gait Assistance: 4: Min assist Locomotion: Ambulation: 1: Travels less than 50 ft with minimal assistance (Pt.>75%)  Comprehension Comprehension Mode: Auditory Comprehension: 5-Understands complex 90% of the time/Cues < 10% of the time  Expression Expression Mode: Verbal Expression: 5-Expresses complex 90% of the time/cues < 10% of the time  Social Interaction Social Interaction: 6-Interacts appropriately with others with medication or extra time (anti-anxiety, antidepressant).  Problem Solving Problem Solving: 5-Solves basic 90% of the time/requires cueing < 10% of the time  Memory Memory: 6-More than reasonable amt of time  2. Anticoagulation/DVT prophylaxis with Pharmaceutical: Lovenox 3. Pain Management: mainly for post op pain see below Medical Problem List and Plan:  1. deconditioning following myocardial infarction. 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 Monitor for deep vein thrombosis  3.. Pain Management: Ultram and tylenol as needed. Monitor with increased mobility  4. Post operative anemia. Patient has been transfused. Followup CBC ok 5. Hypertension. Coreg 6.25 mg twice daily, Lasix 40 mg daily, Altace 2.5 mg daily. Monitor with increased activity  6. Hyperlipidemia. Crestor  7. History of bilateral total knee  replacements.  8. FEN: nutritional intake poor. Consider appetite stimulant, RD f/u 9.  Thymoma-f/u XRT with Dr. Mitzi Hansen   LOS (Days) 2 A FACE TO FACE EVALUATION WAS PERFORMED  Bayden Gil E 07/20/2011, 7:23 AM

## 2011-07-20 NOTE — Patient Care Conference (Signed)
Inpatient RehabilitationTeam Conference Note Date: 07/19/2011   Time: 11:15 am    Patient Name: [name]      Medical Record Number: [id number]  Date of Birth: Apr 30, 1925 Sex: Female         Room/Bed: 4142/4142-01 Payor Info: Payor: [employer]UE [employer]ICARE  Plan: BLUE MEDICARE  Product Type: *No Product type*     Admitting Diagnosis: Deconditioned  Admit Date/Time:  07/18/2011  5:44 PM Admission Comments: No comment available   Primary Diagnosis:  Physical deconditioning Principal Problem: Physical deconditioning  Patient Active Problem List  Diagnoses Date Noted  . Physical deconditioning 07/19/2011  . Acute anterior wall MI 07/07/2011  . HTN (hypertension)   . Osteoarthritis of knee   . CAD (coronary artery disease)   . VSD (ventricular septal defect)     Expected Discharge Date: Expected Discharge Date: 08/01/11  Team Members Present: Physician: Dr. Claudette Laws Case Manager Present: Lutricia Horsfall, RN Social Worker Present: Dossie Der, LCSW Nurse Present: Laural Roes, RN PT Present: Illene Bolus, PT OT Present: Bretta Bang, OT SLP Present: Fae Pippin, SLP Perrin Maltese, OT     Current Status/Progress Goal Weekly Team Focus  Medical   Poor endurance, pulmonary edema, postoperative chest pain  Reduced fluid overload  Medication management, edema reduction techniques   Bowel/Bladder   continent bowel/bladder  remain continent  remain continent   Swallow/Nutrition/ Hydration             ADL's     Evals in process        Mobility     Evals in process        Communication             Safety/Cognition/ Behavioral Observations            Pain   tramadol prn pain to sacrum  < 3 1-10 scale  monitor effectiveness of pain medications   Skin   stg 2 pressure ulcer to sacrum, allevyn. skin tear right scapula, tegaderm  no new breakdown  turn reposition off of sacrum q1hour and as needed      *See Interdisciplinary Assessment  and Plan and progress notes for long and short-term goals  Barriers to Discharge: Cardiopulmonary status, pain management    Possible Resolutions to Barriers:  Adjust medications    Discharge Planning/Teaching Needs:    Family planning to hire caregivers.     Team Discussion:  Discussion of pt's dx, hx, goals, d/c plan. Pt very deconditioned, fatigues easily.O2 sats decrease with activity.  Plan for 2 hr/day therapy schedule. Sternal precautions.   Revisions to Treatment Plan:     Continued Need for Acute Rehabilitation Level of Care: The patient requires daily medical management by a physician with specialized training in physical medicine and rehabilitation for the following conditions: Daily direction of a multidisciplinary physical rehabilitation program to ensure safe treatment while eliciting the highest outcome that is of practical value to the patient.: Yes Daily medical management of patient stability for increased activity during participation in an intensive rehabilitation regime.: Yes Daily analysis of laboratory values and/or radiology reports with any subsequent need for medication adjustment of medical intervention for : Pulmonary problems;Cardiac problems  Meryl Dare 07/20/2011, 12:13 PM

## 2011-07-20 NOTE — Progress Notes (Signed)
Occupational Therapy Session Note  Patient Details  Name: [name] MRN: [id number] Date of Birth: 03/24/1925  Today's Date: 07/20/2011 Time: 1115-1200 Time Calculation (min): 45 min  Short Term Goals: Week 1:  OT Short Term Goal 1 (Week 1): Pt will tolerate standin at the sink during grooming tasks for 5 mins 3 consecutive sessions. OT Short Term Goal 2 (Week 1): Pt will perform LB bathing with min assist using AE 3 consecutive sessions. OT Short Term Goal 3 (Week 1): Pt will perform LB dressing with min assist using AE 3 consecutive sessions. OT Short Term Goal 4 (Week 1): Pt will perform toileting and toilet transfer with close supervision, using RW and 3:1  Skilled Therapeutic Interventions/Progress Updates:    Worked on dressing sit to stand from the wheelchair.  Utilized RW to gather clothes from drawers to begin session.  Pt needed mod assist for sit to stand from elevated wheelchair, without using UEs.  Pt continues to need mod instructional cues to refrain from using them with sit to stand.  Utilized Sports administrator to work on Oncologist pants and underpants over feet.  Required increase time and 3-4 rest breaks to complete.  Performed sit to stand x 3 with mod assist overall each time.  Therapy Documentation Precautions:  Precautions Precautions: Sternal;Fall Required Braces or Orthoses: No Restrictions Weight Bearing Restrictions: Yes (sternal precautions) Other Position/Activity Restrictions: monitor O2 during mobility  Vital Signs: Therapy Vitals Pulse Rate: 95  Oxygen Therapy SpO2: 97 % O2 Device: None (Room air) Pulse Oximetry Type: Intermittent  Pain: Pt with no complaints of pain.  ADL: See FIM for current functional status  Therapy/Group: Individual Therapy  Ricarda Atayde OTR/L 07/20/2011, 12:17 PM

## 2011-07-20 NOTE — Progress Notes (Signed)
Occupational Therapy Session Note  Patient Details  Name: [name] MRN: [id number] Date of Birth: 06-Apr-1925  Today's Date: 07/20/2011 Time: 1330-1430 Time Calculation (min): 60 min  Short Term Goals: Week 1:  OT Short Term Goal 1 (Week 1): Pt will tolerate standin at the sink during grooming tasks for 5 mins 3 consecutive sessions. OT Short Term Goal 2 (Week 1): Pt will perform LB bathing with min assist using AE 3 consecutive sessions. OT Short Term Goal 3 (Week 1): Pt will perform LB dressing with min assist using AE 3 consecutive sessions. OT Short Term Goal 4 (Week 1): Pt will perform toileting and toilet transfer with close supervision, using RW and 3:1  Skilled Therapeutic Interventions/Progress Updates:    Focused on dynamic standing and sit to stand transitions while being engaged in therapeutic activity.  Pt practiced sit to stand from various height chairs without using her UEs for support.  Amount of assist varied from mod to min assist depending on the height of the chair and if pt scooted out to the edge and positioned her feet.  Required min instructional cues to position her feet under her and to not push up from the arms of the chair.  Able to maintain standing for intervals of 2 to 3 mins without difficulty.  Therapy Documentation Precautions:  Precautions Precautions: Sternal;Fall Required Braces or Orthoses: No Restrictions Weight Bearing Restrictions: Yes (sternal precautions) Other Position/Activity Restrictions: monitor O2 during mobility   Vital Signs: Therapy Vitals Temp: 98.1 F (36.7 C) Temp src: Oral Pulse Rate: 65  Resp: 18  BP: 101/60 mmHg Patient Position, if appropriate: Sitting Oxygen Therapy SpO2: 98 % O2 Device: None (Room air) Pain: Pain Assessment Pain Assessment: 0-10 Pain Score:   2 Pain Location: Buttocks Pain Orientation: Left Pain Intervention(s): Repositioned   See FIM for current functional status  Therapy/Group:  Individual Therapy  Otha Rickles OTR/L 07/20/2011, 5:00 PM

## 2011-07-20 NOTE — Progress Notes (Signed)
Per State Regulation 482.30 This chart was reviewed for medical necessity with respect to the patient's Admission/Duration of stay. Pt participating in therapies. Multiple medical issues. Pt fatigues easily, on 2 hr/day therapy schedule to accommodate.  Met with pt to talk about team conference. Pt in agreement with goals and LOS.   Meryl Dare                 Nurse Care Manager            Next Review Date: 07/26/11

## 2011-07-21 DIAGNOSIS — Z5189 Encounter for other specified aftercare: Secondary | ICD-10-CM

## 2011-07-21 DIAGNOSIS — R5381 Other malaise: Secondary | ICD-10-CM

## 2011-07-21 DIAGNOSIS — I2510 Atherosclerotic heart disease of native coronary artery without angina pectoris: Secondary | ICD-10-CM

## 2011-07-21 DIAGNOSIS — I248 Other forms of acute ischemic heart disease: Secondary | ICD-10-CM

## 2011-07-21 LAB — URINE CULTURE

## 2011-07-21 NOTE — Progress Notes (Signed)
Occupational Therapy Session Note  Patient Details  Name: [name] MRN: [id number] Date of Birth: [birth year]  Today's Date: 07/21/2011 Time: 0803-0901 Time Calculation (min): 58 min  Short Term Goals: Week 1:  OT Short Term Goal 1 (Week 1): Pt will tolerate standin at the sink during grooming tasks for 5 mins 3 consecutive sessions. OT Short Term Goal 2 (Week 1): Pt will perform LB bathing with min assist using AE 3 consecutive sessions. OT Short Term Goal 3 (Week 1): Pt will perform LB dressing with min assist using AE 3 consecutive sessions. OT Short Term Goal 4 (Week 1): Pt will perform toileting and toilet transfer with close supervision, using RW and 3:1  Skilled Therapeutic Interventions/Progress Updates:    Pt overall min assist level for sit to stand from wheelchair during LB selfcare.  Utilized the reacher for doffing socks with mod demonstrational cueing.  Also able to use the reacher to donn pants and underpants without cueing but min assist for sit to stand.  Mod instructional cues to refrain from pushing up from arms of the chair or surface secondary to sternal precautions.  Pt also able to utilize RW for gathering clothes with min instructional cueing for posture and to not step too far forward in front of the walker.  Therapy Documentation Precautions:  Precautions Precautions: Sternal;Fall Required Braces or Orthoses: No Restrictions Weight Bearing Restrictions:  (sternal precaution) Other Position/Activity Restrictions: monitor O2 during mobility  Vital Signs: Therapy Vitals Pulse Rate: 71  Patient Position, if appropriate: Lying Oxygen Therapy SpO2: 93 % O2 Device: None (Room air) Pulse Oximetry Type: Intermittent Pain: Pain Assessment Pain Assessment: No/denies pain Pain Score: 0-No pain ADL: See FIM for current functional status  Therapy/Group: Individual Therapy  Nariya Neumeyer OTR/L 07/21/2011, 9:19 AM

## 2011-07-21 NOTE — Progress Notes (Signed)
Occupational Therapy Session Note  Patient Details  Name: [name] MRN: [id number] Date of Birth: 01-01-1925  Today's Date: 07/21/2011 Time: 8413-2440 Time Calculation (min): 29 min  Short Term Goals: Week 1:  OT Short Term Goal 1 (Week 1): Pt will tolerate standin at the sink during grooming tasks for 5 mins 3 consecutive sessions. OT Short Term Goal 2 (Week 1): Pt will perform LB bathing with min assist using AE 3 consecutive sessions. OT Short Term Goal 3 (Week 1): Pt will perform LB dressing with min assist using AE 3 consecutive sessions. OT Short Term Goal 4 (Week 1): Pt will perform toileting and toilet transfer with close supervision, using RW and 3:1  Skilled Therapeutic Interventions/Progress Updates:    Continued practice with AE (reacher and sock aide) to help increase independence with LB dressing.  Pt needs mod demonstrational cues to utilize equipment for doffing and donning gripper socks.  Also performed 1 grooming task at the sink in standing prior to returning to bed to rest.  Pt still with decreased endurance.  Needs frequent rest breaks with minimal activity.  Min assist for sit to stand from the bedside chair.   Therapy Documentation Precautions:  Precautions Precautions: Sternal Required Braces or Orthoses: No Restrictions Weight Bearing Restrictions: No Other Position/Activity Restrictions: monitor O2 during mobility  Vital Signs: Oxygen Therapy SpO2: 95 % O2 Device: None (Room air) Pulse Oximetry Type: Intermittent Pain: Pain Assessment Pain Assessment: No/denies pain Pain Score: 0-No pain  See FIM for current functional status  Therapy/Group: Individual Therapy  Shady Bradish OTR/L 07/21/2011, 2:03 PM

## 2011-07-21 NOTE — Progress Notes (Signed)
Physical Therapy Session Note  Patient Details  Name: [name] MRN: [id number] Date of Birth: 1924-12-02  Today's Date: 07/21/2011 Time: 1000-1100 Time Calculation (min): 60 min  Skilled Therapeutic Interventions/Progress Updates:     Therapy Documentation Precautions:  Precautions Precautions: Sternal Required Braces or Orthoses: No Restrictions Weight Bearing Restrictions: No Other Position/Activity Restrictions: monitor O2 during mobility General: Chart Reviewed: Yes Family/Caregiver Present: No Vital Signs: Therapy Vitals Pulse Rate: 71 bpm with mobility Oxygen Therapy SpO2: 95 % O2 Device: None (Room air) Pulse Oximetry Type: Intermittent Pain: 0/10    Treatments:  Sit-stand transfer training with focus on UE position to maintain sternal precautions and increased bilat knee flexion to increase biomechanical advantage and functional independence min assist 2 x 5 trials. Sit to squat position with 5 second hold 2 x 5 reps for increased functional strength and transfer facilitation. Standing mini-squats with bilat UE support of RW 2 x 10 reps for increased functional strength. Supine-sit transfer training x 2 trials, emphasis on UE position to maintain sternal precautions, sequencing, and use of LEs on side of bed mod assist. Nu-step level one 3 x 3 min for increased muscular and cardiovascular endurance. BORG scale 9, "light" following with O2 > 93% throughout and HR 71-78 bpm.  See FIM for current functional status  Therapy/Group: Individual Therapy  Romeo Rabon 07/21/2011, 12:25 PM

## 2011-07-21 NOTE — Progress Notes (Signed)
Patient ID: [name], female   DOB: 1924-07-24, 78 y.o.   MRN: [id number] Subjective/Complaints: I am very weak. No SOB in bed.  Sat with OT 93%.  Not using PICC except Blood draws Review of Systems  Respiratory: Positive for cough and shortness of breath.   Neurological: Positive for focal weakness.  All other systems reviewed and are negative.   Objective: Vital Signs: Blood pressure 102/63, pulse 91, temperature 98.6 F (37 C), temperature source Oral, resp. rate 18, height 5\' 1"  (1.549 m), weight 64.7 kg (142 lb 10.2 oz), SpO2 95.00%. Dg Chest 2 View  07/19/2011  *RADIOLOGY REPORT*  Clinical Data: Decreased breath sounds, status post CABG  CHEST - 2 VIEW  Comparison: 07/17/2011  Findings: There is a right arm PICC line with tip in the cavoatrial junction.  The heart size appears normal.  There are bilateral pleural effusions and interstitial edema.  Atelectasis within the right midlung and right upper lobe is noted.  IMPRESSION:  1.  Moderate CHF. 2.  Right lung atelectasis.  Original Report Authenticated By: Rosealee Albee, M.D.   Results for orders placed during the hospital encounter of 07/18/11 (from the past 72 hour(s))  CBC     Status: Abnormal   Collection Time   07/19/11  5:30 AM      Component Value Range Comment   WBC 17.5 (*) 4.0 - 10.5 (K/uL)    RBC 3.19 (*) 3.87 - 5.11 (MIL/uL)    Hemoglobin 9.3 (*) 12.0 - 15.0 (g/dL)    HCT 21.3 (*) 08.6 - 46.0 (%)    MCV 89.3  78.0 - 100.0 (fL)    MCH 29.2  26.0 - 34.0 (pg)    MCHC 32.6  30.0 - 36.0 (g/dL)    RDW 57.8 (*) 46.9 - 15.5 (%)    Platelets 465 (*) 150 - 400 (K/uL)   COMPREHENSIVE METABOLIC PANEL     Status: Abnormal   Collection Time   07/19/11  5:30 AM      Component Value Range Comment   Sodium 135  135 - 145 (mEq/L)    Potassium 4.0  3.5 - 5.1 (mEq/L)    Chloride 99  96 - 112 (mEq/L)    CO2 26  19 - 32 (mEq/L)    Glucose, Bld 139 (*) 70 - 99 (mg/dL)    BUN 21  6 - 23 (mg/dL)    Creatinine, Ser 6.29  0.50 -  1.10 (mg/dL)    Calcium 8.8  8.4 - 10.5 (mg/dL)    Total Protein 5.4 (*) 6.0 - 8.3 (g/dL)    Albumin 2.4 (*) 3.5 - 5.2 (g/dL)    AST 19  0 - 37 (U/L)    ALT 41 (*) 0 - 35 (U/L)    Alkaline Phosphatase 70  39 - 117 (U/L)    Total Bilirubin 0.9  0.3 - 1.2 (mg/dL)    GFR calc non Af Amer 45 (*) >90 (mL/min)    GFR calc Af Amer 52 (*) >90 (mL/min)   DIFFERENTIAL     Status: Abnormal   Collection Time   07/19/11  5:30 AM      Component Value Range Comment   Neutrophils Relative 80 (*) 43 - 77 (%)    Neutro Abs 14.0 (*) 1.7 - 7.7 (K/uL)    Lymphocytes Relative 7 (*) 12 - 46 (%)    Lymphs Abs 1.2  0.7 - 4.0 (K/uL)    Monocytes Relative 7  3 - 12 (%)  Monocytes Absolute 1.3 (*) 0.1 - 1.0 (K/uL)    Eosinophils Relative 6 (*) 0 - 5 (%)    Eosinophils Absolute 1.0 (*) 0.0 - 0.7 (K/uL)    Basophils Relative 1  0 - 1 (%)    Basophils Absolute 0.1  0.0 - 0.1 (K/uL)   URINALYSIS, ROUTINE W REFLEX MICROSCOPIC     Status: Abnormal   Collection Time   07/19/11  5:55 PM      Component Value Range Comment   Color, Urine YELLOW  YELLOW     APPearance CLEAR  CLEAR     Specific Gravity, Urine 1.015  1.005 - 1.030     pH 7.0  5.0 - 8.0     Glucose, UA NEGATIVE  NEGATIVE (mg/dL)    Hgb urine dipstick NEGATIVE  NEGATIVE     Bilirubin Urine NEGATIVE  NEGATIVE     Ketones, ur NEGATIVE  NEGATIVE (mg/dL)    Protein, ur NEGATIVE  NEGATIVE (mg/dL)    Urobilinogen, UA 1.0  0.0 - 1.0 (mg/dL)    Nitrite NEGATIVE  NEGATIVE     Leukocytes, UA SMALL (*) NEGATIVE    URINE MICROSCOPIC-ADD ON     Status: Abnormal   Collection Time   07/19/11  5:55 PM      Component Value Range Comment   Squamous Epithelial / LPF MANY (*) RARE     WBC, UA 3-6  <3 (WBC/hpf)    Bacteria, UA RARE  RARE    URINE CULTURE     Status: Normal   Collection Time   07/19/11  5:55 PM      Component Value Range Comment   Specimen Description URINE, CLEAN CATCH      Special Requests ADDED ON 07/20/11 AT 0753      Culture  Setup Time  [id number]      Colony Count 7,000 COLONIES/ML      Culture INSIGNIFICANT GROWTH      Report Status 07/21/2011 FINAL      Physical Exam  Nursing note and vitals reviewed.  Constitutional: She is oriented to person, place, and time. She appears well-developed and well-nourished.  HENT:  Head: Normocephalic and atraumatic.  Eyes: Pupils are equal, round, and reactive to light. eomi  Neck: Normal range of motion. Neck supple.  Cardiovascular: Normal rate and regular rhythm.  Pulmonary/Chest: Effort normal. She has slightly decreased BS bilateral bases. She exhibits no tenderness.  Abdominal: Soft. Bowel sounds are normal.  Musculoskeletal: Normal range of motion.  Neurological: She is alert and oriented to person, place, and time.  Skin: Skin is warm and dry. Chest incision is clean and dry. Some redness along sacrum, bruising along left leg incision with healing wound. Psychiatric: She has a normal mood and affect but is fatigued. Her behavior is normal. Thought content normal.  motor strength is 4/5 in bilateral deltoid, biceps, triceps, and grip as well as hip flexion, knee extension, ankle dorsiflexion.  Sensation is intact to light touch in bilateral upper and lower extremities, dtr's 1+, no gross cognitive deficits. Cn exam grossly intact  Mood and affect are appropriate  General appears tired but otherwise no acute distress    Assessment/Plan: 1. Functional deficits secondary to deconditioning following MI and CABG which require 3+ hours per day of interdisciplinary therapy in a comprehensive inpatient rehab setting. Physiatrist is providing close team supervision and 24 hour management of active medical problems listed below. Physiatrist and rehab team continue to assess barriers to discharge/monitor patient progress toward  functional and medical goals. FIM: FIM - Bathing Bathing Steps Patient Completed: Chest;Right Arm;Left Arm;Abdomen;Right upper leg;Left upper leg Bathing: 3:  Mod-Patient completes 5-7 16f 10 parts or 50-74%  FIM - Upper Body Dressing/Undressing Upper body dressing/undressing steps patient completed: Thread/unthread right sleeve of front closure shirt/dress;Thread/unthread left sleeve of front closure shirt/dress;Button/unbutton shirt;Pull shirt around back of front closure shirt/dress Upper body dressing/undressing: 5: Set-up assist to: Obtain clothing/put away FIM - Lower Body Dressing/Undressing Lower body dressing/undressing steps patient completed: Thread/unthread right underwear leg;Thread/unthread left underwear leg;Pull underwear up/down;Thread/unthread right pants leg;Thread/unthread left pants leg;Pull pants up/down Lower body dressing/undressing: 4: Steadying Assist (Pt required use of a reacher for donning pants/underpants.)  FIM - Toileting Toileting steps completed by patient: Adjust clothing prior to toileting;Performs perineal hygiene;Adjust clothing after toileting Toileting Assistive Devices: Grab bar or rail for support Toileting: 4: Steadying assist  FIM - Diplomatic Services operational officer Devices: Bedside commode Toilet Transfers: 4-To toilet/BSC: Min A (steadying Pt. > 75%)  FIM - Bed/Chair Transfer Bed/Chair Transfer Assistive Devices: Therapist, occupational: 5: Supine > Sit: Supervision (verbal cues/safety issues);5: Sit > Supine: Supervision (verbal cues/safety issues);4: Bed > Chair or W/C: Min A (steadying Pt. > 75%);4: Chair or W/C > Bed: Min A (steadying Pt. > 75%)  FIM - Locomotion: Wheelchair Locomotion: Wheelchair: 0: Activity did not occur FIM - Locomotion: Ambulation Locomotion: Ambulation Assistive Devices: Designer, industrial/product Ambulation/Gait Assistance: 4: Min assist Locomotion: Ambulation: 1: Travels less than 50 ft with minimal assistance (Pt.>75%)  Comprehension Comprehension Mode: Auditory;Visual Comprehension: 5-Follows basic conversation/direction: With extra time/assistive  device  Expression Expression Mode: Verbal Expression: 5-Expresses basic needs/ideas: With extra time/assistive device  Social Interaction Social Interaction: 5-Interacts appropriately 90% of the time - Needs monitoring or encouragement for participation or interaction.  Problem Solving Problem Solving: 5-Solves basic 90% of the time/requires cueing < 10% of the time  Memory Memory: 5-Recognizes or recalls 90% of the time/requires cueing < 10% of the time  2. Anticoagulation/DVT prophylaxis with Pharmaceutical: Lovenox 3. Pain Management: mainly for post op pain see below Medical Problem List and Plan:  1. deconditioning following myocardial infarction. 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 Monitor for deep vein thrombosis  3.. Pain Management: Ultram and tylenol as needed. Monitor with increased mobility  4. Post operative anemia. Patient has been transfused. Followup CBC ok 5. Hypertension. Coreg 6.25 mg twice daily, Lasix 40 mg daily, Altace 2.5 mg daily. Monitor with increased activity  6. Hyperlipidemia. Crestor  7. History of bilateral total knee replacements.  8. FEN: nutritional intake poor. Consider appetite stimulant, RD f/u 9.  Thymoma-f/u XRT with Dr. Mitzi Hansen   LOS (Days) 3 A FACE TO FACE EVALUATION WAS PERFORMED  Kahner Yanik E 07/21/2011, 8:11 AM

## 2011-07-22 DIAGNOSIS — Z5189 Encounter for other specified aftercare: Secondary | ICD-10-CM

## 2011-07-22 DIAGNOSIS — R5381 Other malaise: Secondary | ICD-10-CM

## 2011-07-22 MED ORDER — MEGESTROL ACETATE 400 MG/10ML PO SUSP
400.0000 mg | Freq: Every day | ORAL | Status: DC
  Administered 2011-07-22 – 2011-07-24 (×3): 400 mg via ORAL
  Filled 2011-07-22 (×4): qty 10

## 2011-07-22 NOTE — Progress Notes (Signed)
Patient ID: [name], female   DOB: Jul 22, 1924, 78 y.o.   MRN: [id number] Patient ID: [name], female   DOB: May 17, 1925, 78 y.o.   MRN: [id number] Subjective/Complaints: I am very weak.Marland Kitchen Poor appetite. Not sleeping well because of bed sore-- 2/16 Review of Systems  Respiratory: Positive for cough and shortness of breath.   Neurological: Positive for focal weakness.  All other systems reviewed and are negative.   Objective: Vital Signs: Blood pressure 99/62, pulse 76, temperature 97.9 F (36.6 C), temperature source Oral, resp. rate 18, height 5\' 1"  (1.549 m), weight 64.7 kg (142 lb 10.2 oz), SpO2 89.00%. No results found. Results for orders placed during the hospital encounter of 07/18/11 (from the past 72 hour(s))  URINALYSIS, ROUTINE W REFLEX MICROSCOPIC     Status: Abnormal   Collection Time   07/19/11  5:55 PM      Component Value Range Comment   Color, Urine YELLOW  YELLOW     APPearance CLEAR  CLEAR     Specific Gravity, Urine 1.015  1.005 - 1.030     pH 7.0  5.0 - 8.0     Glucose, UA NEGATIVE  NEGATIVE (mg/dL)    Hgb urine dipstick NEGATIVE  NEGATIVE     Bilirubin Urine NEGATIVE  NEGATIVE     Ketones, ur NEGATIVE  NEGATIVE (mg/dL)    Protein, ur NEGATIVE  NEGATIVE (mg/dL)    Urobilinogen, UA 1.0  0.0 - 1.0 (mg/dL)    Nitrite NEGATIVE  NEGATIVE     Leukocytes, UA SMALL (*) NEGATIVE    URINE MICROSCOPIC-ADD ON     Status: Abnormal   Collection Time   07/19/11  5:55 PM      Component Value Range Comment   Squamous Epithelial / LPF MANY (*) RARE     WBC, UA 3-6  <3 (WBC/hpf)    Bacteria, UA RARE  RARE    URINE CULTURE     Status: Normal   Collection Time   07/19/11  5:55 PM      Component Value Range Comment   Specimen Description URINE, CLEAN CATCH      Special Requests ADDED ON 07/20/11 AT 0753      Culture  Setup Time [id number]      Colony Count 7,000 COLONIES/ML      Culture INSIGNIFICANT GROWTH      Report Status 07/21/2011 FINAL      Physical Exam    Nursing note and vitals reviewed.  Constitutional: She is oriented to person, place, and time. She appears well-developed and well-nourished.  HENT:  Head: Normocephalic and atraumatic.  Eyes: Pupils are equal, round, and reactive to light. eomi  Neck: Normal range of motion. Neck supple.  Cardiovascular: Normal rate and regular rhythm.  Pulmonary/Chest: Effort normal. She has slightly decreased BS bilateral bases. She exhibits no tenderness.  Abdominal: Soft. Bowel sounds are normal.  Musculoskeletal: Normal range of motion.  Neurological: She is alert and oriented to person, place, and time.  Skin: Skin is warm and dry. Chest incision is clean and dry. Some mild breakdown along sacrum, bruising along left leg incision with healing wound. Psychiatric: She has a normal mood and affect but is fatigued. Her behavior is normal. Thought content normal.  motor strength is 4/5 in bilateral deltoid, biceps, triceps, and grip as well as hip flexion, knee extension, ankle dorsiflexion.  Sensation is intact to light touch in bilateral upper and lower extremities, dtr's 1+, no gross cognitive deficits. Cn  exam grossly intact  Mood and affect are appropriate  General appears tired but otherwise no acute distress -- anxious. 2/16 exam  Assessment/Plan: 1. Functional deficits secondary to deconditioning following MI and CABG which require 3+ hours per day of interdisciplinary therapy in a comprehensive inpatient rehab setting. Physiatrist is providing close team supervision and 24 hour management of active medical problems listed below. Physiatrist and rehab team continue to assess barriers to discharge/monitor patient progress toward functional and medical goals. FIM: FIM - Bathing Bathing Steps Patient Completed: Chest;Right Arm;Left Arm;Abdomen;Right upper leg;Left upper leg;Front perineal area;Buttocks Bathing: 4: Min-Patient completes 8-9 12f 10 parts or 75+ percent  FIM - Upper Body  Dressing/Undressing Upper body dressing/undressing steps patient completed: Thread/unthread right sleeve of front closure shirt/dress;Thread/unthread left sleeve of front closure shirt/dress;Pull shirt around back of front closure shirt/dress;Button/unbutton shirt Upper body dressing/undressing: 5: Set-up assist to: Obtain clothing/put away FIM - Lower Body Dressing/Undressing Lower body dressing/undressing steps patient completed: Thread/unthread right underwear leg;Thread/unthread left underwear leg;Pull underwear up/down;Thread/unthread left pants leg;Pull pants up/down;Thread/unthread right pants leg Lower body dressing/undressing: 4: Min-Patient completed 75 plus % of tasks  FIM - Toileting Toileting steps completed by patient:  (Pt did not complete socks secondary to time.) Toileting Assistive Devices: Grab bar or rail for support Toileting: 0: Activity did not occur  FIM - Diplomatic Services operational officer Devices: Psychiatrist Transfers: 0-Activity did not occur  FIM - Banker Devices: Therapist, occupational: 4: Chair or W/C > Bed: Min A (steadying Pt. > 75%)  FIM - Locomotion: Wheelchair Locomotion: Wheelchair: 1: Total Assistance/staff pushes wheelchair (Pt<25%) FIM - Locomotion: Ambulation Locomotion: Ambulation Assistive Devices: Designer, industrial/product Ambulation/Gait Assistance: 4: Min assist Locomotion: Ambulation: 2: Travels 50 - 149 ft with minimal assistance (Pt.>75%)  Comprehension Comprehension Mode: Auditory Comprehension: 5-Follows basic conversation/direction: With extra time/assistive device  Expression Expression Mode: Verbal Expression: 5-Expresses basic needs/ideas: With extra time/assistive device  Social Interaction Social Interaction: 5-Interacts appropriately 90% of the time - Needs monitoring or encouragement for participation or interaction.  Problem Solving Problem Solving: 5-Solves basic  90% of the time/requires cueing < 10% of the time  Memory Memory: 5-Recognizes or recalls 90% of the time/requires cueing < 10% of the time  2. Anticoagulation/DVT prophylaxis with Pharmaceutical: Lovenox 3. Pain Management: mainly for post op pain see below Medical Problem List and Plan:  1. deconditioning following myocardial infarction. 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 Monitor for deep vein thrombosis  3.. Pain Management: Ultram and tylenol as needed. Monitor with increased mobility  4. Post operative anemia. Patient has been transfused. Followup CBC ok 5. Hypertension. Coreg 6.25 mg twice daily, Lasix 40 mg daily, Altace 2.5 mg daily. Monitor with increased activity  6. Hyperlipidemia. Crestor  7. History of bilateral total knee replacements.  8. FEN: nutritional intake poor. Add megace.  RD f/u 9.  Thymoma-f/u XRT with Dr. Mitzi Hansen   LOS (Days) 4 A FACE TO FACE EVALUATION WAS PERFORMED  Ameliarose Shark T 07/22/2011, 8:58 AM

## 2011-07-22 NOTE — Progress Notes (Signed)
Physical Therapy Session Note  Patient Details  Name: [name] MRN: [id number] Date of Birth: [birth year]  Today's Date: 07/22/2011 Time: 0900-0955 Time Calculation (min): 55 min  Skilled Therapeutic Interventions/Progress Updates: GAit training for safety/endurance with RW; therapeutic activities to improve tolerance to activity monitoring Oxygen sats     Therapy Documentation Precautions:  Precautions Precautions: Sternal Required Braces or Orthoses: No Restrictions Weight Bearing Restrictions: No Other Position/Activity Restrictions: monitor O2 during mobility General: Reviewed sternal precautions   Vital Signs: Therapy Vitals Temp: 97.9 F (36.6 C) Temp src: Oral Pulse Rate: 76  Resp: 18  BP: 99/62 mmHg Patient Position, if appropriate: Sitting Oxygen Therapy SpO2: 89 % O2 Device: None (Room air) Pain: Pain Assessment Pain Score:   1 Mobility: Sit to stand close supervision from wc   Locomotion : Ambulation Ambulation/Gait Assistance: 4: Min assist  80 feet X 1 with RW ; up/down 2 steps 1 rail left min assist X 1; Oxygen Sats post ambulation or steps > 92% RA     Exercises: Sit to stand X 5 from raised mat close supervision observing sternal precautions for quad strengthening ; Nustep (crosstrainer) X 5 minutes LEVEL 1 LEs only (Oxygen sats > 92% RA pulse 83, perceived exertion 17 Hard)    See FIM for current functional status  Therapy/Group: Individual Therapy  [name],JIM 07/22/2011, 9:51 AM

## 2011-07-23 NOTE — Progress Notes (Signed)
Subjective/Complaints: 2/17- still with some complaints of generalized weakness but otherwise no complaints Review of Systems  Respiratory: Positive for cough and shortness of breath.   Neurological: Positive for focal weakness.  All other systems reviewed and are negative.   Objective: Vital Signs: Blood pressure 121/72, pulse 88, temperature 98 F (36.7 C), temperature source Oral, resp. rate 20, height 5\' 1"  (1.549 m), weight 64.7 kg (142 lb 10.2 oz), SpO2 92.00%. No results found. No results found for this or any previous visit (from the past 72 hour(s)). Physical Exam  Nursing note and vitals reviewed.  Constitutional: She is oriented to person, place, and time. She appears well-developed and well-nourished.  HENT:  Head: Normocephalic and atraumatic.  Eyes: Pupils are equal, round, and reactive to light. eomi  Neck: Normal range of motion. Neck supple.  Cardiovascular: Normal rate and regular rhythm.  Pulmonary/Chest: Effort normal. She has slightly decreased BS bilateral bases. She exhibits no tenderness.  Abdominal: Soft. Bowel sounds are normal.  Musculoskeletal: Normal range of motion.  Neurological: She is alert and oriented to person, place, and time.  Skin: Skin is warm and dry. Chest incision is clean and dry. Some mild breakdown along sacrum, bruising along left leg incision with healing wound. Psychiatric: She has a normal mood and affect but is fatigued. Her behavior is normal. Thought content normal.  motor strength is 4/5 in bilateral deltoid, biceps, triceps, and grip as well as hip flexion, knee extension, ankle dorsiflexion.  Sensation is intact to light touch in bilateral upper and lower extremities, dtr's 1+, no gross cognitive deficits. Cn exam grossly intact  Mood and affect are appropriate  General appears tired but otherwise no acute distress -- anxious. 2/17 exam  Assessment/Plan: 1. Functional deficits secondary to deconditioning following MI and CABG  which require 3+ hours per day of interdisciplinary therapy in a comprehensive inpatient rehab setting. Physiatrist is providing close team supervision and 24 hour management of active medical problems listed below. Physiatrist and rehab team continue to assess barriers to discharge/monitor patient progress toward functional and medical goals.  Stood up from recliner with cues and supervision.  FIM: FIM - Bathing Bathing Steps Patient Completed: Chest;Right Arm;Left Arm;Abdomen;Front perineal area;Buttocks;Right upper leg;Left upper leg Bathing: 5: Set-up assist to: Obtain items  FIM - Upper Body Dressing/Undressing Upper body dressing/undressing steps patient completed: Thread/unthread left sleeve of pullover shirt/dress;Thread/unthread right sleeve of pullover shirt/dresss;Put head through opening of pull over shirt/dress;Pull shirt over trunk Upper body dressing/undressing: 5: Set-up assist to: Obtain clothing/put away FIM - Lower Body Dressing/Undressing Lower body dressing/undressing steps patient completed: Thread/unthread right underwear leg;Thread/unthread left underwear leg;Pull underwear up/down;Thread/unthread right pants leg;Thread/unthread left pants leg;Pull pants up/down;Don/Doff left sock Lower body dressing/undressing: 4: Min-Patient completed 75 plus % of tasks  FIM - Toileting Toileting steps completed by patient:  (Pt did not complete socks secondary to time.) Toileting Assistive Devices: Grab bar or rail for support Toileting: 4: Steadying assist  FIM - Diplomatic Services operational officer Devices: Grab bars;Walker Toilet Transfers: 5-To toilet/BSC: Supervision (verbal cues/safety issues)  FIM - Banker Devices: Therapist, occupational: 5: Bed > Chair or W/C: Supervision (verbal cues/safety issues);5: Chair or W/C > Bed: Supervision (verbal cues/safety issues)  FIM - Locomotion: Wheelchair Locomotion: Wheelchair: 1:  Total Assistance/staff pushes wheelchair (Pt<25%) FIM - Locomotion: Ambulation Locomotion: Ambulation Assistive Devices: Designer, industrial/product Ambulation/Gait Assistance: 4: Min assist Locomotion: Ambulation: 2: Travels 50 - 149 ft with minimal assistance (Pt.>75%)  Comprehension Comprehension  Mode: Auditory Comprehension: 5-Understands complex 90% of the time/Cues < 10% of the time  Expression Expression Mode: Verbal Expression: 5-Expresses basic needs/ideas: With no assist  Social Interaction Social Interaction: 5-Interacts appropriately 90% of the time - Needs monitoring or encouragement for participation or interaction.  Problem Solving Problem Solving: 5-Solves basic 90% of the time/requires cueing < 10% of the time  Memory Memory: 5-Recognizes or recalls 90% of the time/requires cueing < 10% of the time  2. Anticoagulation/DVT prophylaxis with Pharmaceutical: Lovenox 3. Pain Management: mainly for post op pain see below Medical Problem List and Plan:  1. deconditioning following myocardial infarction. 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 Monitor for deep vein thrombosis  3.. Pain Management: Ultram and tylenol as needed. Monitor with increased mobility  4. Post operative anemia. Patient has been transfused. Followup CBC ok 5. Hypertension. Coreg 6.25 mg twice daily, Lasix 40 mg daily, Altace 2.5 mg daily. Monitor with increased activity  6. Hyperlipidemia. Crestor  7. History of bilateral total knee replacements.  8. FEN: nutritional intake poor. Added megace. Ate only 30 percent yesterday. RD f/u 9.  Thymoma-f/u XRT with Dr. Mitzi Hansen   LOS (Days) 5 A FACE TO FACE EVALUATION WAS PERFORMED  SWARTZ,ZACHARY T 07/23/2011, 8:24 AM

## 2011-07-23 NOTE — Progress Notes (Signed)
Occupational Therapy Session Note  Patient Details  Name: [name] MRN: [id number] Date of Birth: 09-01-1924  Today's Date: 07/23/2011 Time:  - 1115-1215  ( )    Short Term Goals: Week 1:  OT Short Term Goal 1 (Week 1): Pt will tolerate standin at the sink during grooming tasks for 5 mins 3 consecutive sessions. OT Short Term Goal 2 (Week 1): Pt will perform LB bathing with min assist using AE 3 consecutive sessions. OT Short Term Goal 3 (Week 1): Pt will perform LB dressing with min assist using AE 3 consecutive sessions. OT Short Term Goal 4 (Week 1): Pt will perform toileting and toilet transfer with close supervision, using RW and 3:1  Skilled Therapeutic Interventions/Progress Updates:    Pain:  3/10>  Engaged in therapeutic bathing and dressing at sink level.  Pt utilized adaptive equipment for Lower body.  Pt.took rest breaks as needed when she got winded. Pt. Stood with supervision for peri area.  Addressed sternal precautions while engaged in activity  Therapy Documentation Precautions:  Precautions Precautions: Sternal Required Braces or Orthoses: No Restrictions Weight Bearing Restrictions: No Other Position/Activity Restrictions: monitor O2 during mobility   Pain: Pain Assessment Pain Score:3/10     See FIM for current functional status  Therapy/Group: Individual Therapy  Humberto Seals 07/23/2011, 12:12 PM

## 2011-07-23 NOTE — Progress Notes (Signed)
Occupational Therapy Session Note  Patient Details  Name: ARIANN KHAIMOV MRN: [id number] Date of Birth: [birth year]  Today's Date 07/22/11: Time Calcuation: 60 minutes   Skilled Therapeutic Interventions/Progress Updates: one on one endurance activities seated as patient c/o fatigue but concurred to activities w/c level.  Activities are for increasing patient independence with ADLs and IADLs     Therapy/Group: Individual Therapy  Rozelle Logan 07/23/2011, 1:51 PM late entry for 07/22/11

## 2011-07-23 NOTE — Progress Notes (Signed)
Physical Therapy Note  Patient Details  Name: JAVONA BERGEVIN MRN: [id number] Date of Birth: April 30, 1925 Today's Date: 07/23/2011  1400-1455 (55 minutes) group Pain - no complaint of pain Treatment: Pt participated in PT gait group to improve ambulation safety/endurance . Pt ambulated 80 feet X 3 with RW close supervision with vcs to stay closer to AD for safety; up/down 4 steps with one rail (left) sideways min assist. Pt observing CABG chest precautions during sit to stand (close supervision). Pt. Tolerated session without reported difficulties.  Jaylon Boylen,JIM 07/23/2011, 3:23 PM

## 2011-07-24 MED ORDER — OXYCODONE HCL 10 MG PO TB12: 10.0000 mg | ORAL_TABLET | Freq: Two times a day (BID) | ORAL | Status: DC

## 2011-07-24 MED ORDER — MEGESTROL ACETATE 400 MG/10ML PO SUSP
400.0000 mg | Freq: Two times a day (BID) | ORAL | Status: DC
  Administered 2011-07-24 – 2011-07-31 (×15): 400 mg via ORAL
  Filled 2011-07-24 (×18): qty 10

## 2011-07-24 MED ORDER — SODIUM CHLORIDE 0.9 % NICU IV INFUSION SIMPLE: INJECTION | INTRAVENOUS | Status: DC

## 2011-07-24 NOTE — Progress Notes (Signed)
Occupational Therapy Session Note  Patient Details  Name: [name] MRN: [id number] Date of Birth: 12-06-1924  Today's Date: 07/24/2011 Time: 4098-1191 Time Calculation (min): 43 min  Short Term Goals: Week 1:  OT Short Term Goal 1 (Week 1): Pt will tolerate standin at the sink during grooming tasks for 5 mins 3 consecutive sessions. OT Short Term Goal 2 (Week 1): Pt will perform LB bathing with min assist using AE 3 consecutive sessions. OT Short Term Goal 3 (Week 1): Pt will perform LB dressing with min assist using AE 3 consecutive sessions. OT Short Term Goal 4 (Week 1): Pt will perform toileting and toilet transfer with close supervision, using RW and 3:1  Skilled Therapeutic Interventions/Progress Updates:    Practiced simulated walk-in shower transfers using the rolling walker.  Pt able to perform with min assist getting in and out.  Continues to need min assist for sit to stand from lower surfaces, without use of the UEs.  Transitioned to the therapy gym and placed pt in supine.  Applied moist heat pack to posterior neck for 8 mins without any adverse reaction.  Progressed to performing cervical extension stretching in supine as well as lateral cervical flexion in both directions with therapist assisting.  Pt with decreased cervical extension overall but can achieve 10 to 15 degrees past neutral position.  Pt also with increased tightness in right lateral flexion.  Encouraged pt to focus on sitting up tall with shoulders back and avoid cervical flexion as much as possible since it influences her posture.  Therapy Documentation Precautions:  Precautions Precautions: Sternal Required Braces or Orthoses: No Restrictions Weight Bearing Restrictions: No Other Position/Activity Restrictions: monitor O2 during mobility  Pain: Pain Assessment Pain Assessment: No/denies pain  ADL: See FIM for current functional status  Therapy/Group: Individual Therapy  Ryle Buscemi  OTR/L 07/24/2011, 4:08 PM

## 2011-07-24 NOTE — Progress Notes (Signed)
Per State Regulation 482.30 This chart was reviewed for medical necessity with respect to the patient's Admission/Duration of stay. Pt participating in therapies and progressing toward goals. Maintaining sternal precautions. Requiring O2 with activity at times. Pt c/o light sensitivity, MD is aware. Meryl Dare                 Nurse Care Manager            Next Review Date: 07/27/11

## 2011-07-24 NOTE — Progress Notes (Signed)
Patient ID: [name], female   DOB: [birth year], 78 y.o.   MRN: [id number] Subjective/Complaints: Pain controlled.  Some photosensitivity reported no itching or D/C Review of Systems  Respiratory: Positive for cough and shortness of breath.   Neurological: Positive for focal weakness.  All other systems reviewed and are negative.   Objective: Vital Signs: Blood pressure 103/65, pulse 89, temperature 98.5 F (36.9 C), temperature source Oral, resp. rate 19, height 5\' 1"  (1.549 m), weight 64.7 kg (142 lb 10.2 oz), SpO2 95.00%. No results found. No results found for this or any previous visit (from the past 72 hour(s)). Physical Exam  Nursing note and vitals reviewed.  Constitutional: She is oriented to person, place, and time. She appears well-developed and well-nourished.  HENT:  Head: Normocephalic and atraumatic.  Eyes: Pupils are equal, round, and reactive to light. eomi  Neck: Normal range of motion. Neck supple.  Cardiovascular: Normal rate and regular rhythm.  Pulmonary/Chest: Effort normal. She has slightly decreased BS bilateral bases. She exhibits no tenderness.  Abdominal: Soft. Bowel sounds are normal.  Musculoskeletal: Normal range of motion.  Neurological: She is alert and oriented to person, place, and time.  Skin: Skin is warm and dry. Chest incision is clean and dry. Some mild breakdown along sacrum, bruising along left leg incision with healing wound. Psychiatric: She has a normal mood and affect but is fatigued. Her behavior is normal. Thought content normal.  motor strength is 4/5 in bilateral deltoid, biceps, triceps, and grip as well as hip flexion, knee extension, ankle dorsiflexion.  Sensation is intact to light touch in bilateral upper and lower extremities, dtr's 1+, no gross cognitive deficits. Cn exam grossly intact  Mood and affect are appropriate  General appears tired but otherwise no acute distress -- anxious. 2/17 exam  Assessment/Plan: 1.  Functional deficits secondary to deconditioning following MI and CABG which require 3+ hours per day of interdisciplinary therapy in a comprehensive inpatient rehab setting. Physiatrist is providing close team supervision and 24 hour management of active medical problems listed below. Physiatrist and rehab team continue to assess barriers to discharge/monitor patient progress toward functional and medical goals.  Stood up from recliner with cues and supervision.  FIM: FIM - Bathing Bathing Steps Patient Completed: Chest;Right Arm;Left Arm;Abdomen;Front perineal area;Buttocks;Right upper leg;Left upper leg Bathing: 5: Set-up assist to: Obtain items  FIM - Upper Body Dressing/Undressing Upper body dressing/undressing steps patient completed: Thread/unthread left sleeve of pullover shirt/dress;Thread/unthread right sleeve of pullover shirt/dresss;Put head through opening of pull over shirt/dress;Pull shirt over trunk Upper body dressing/undressing: 5: Set-up assist to: Obtain clothing/put away FIM - Lower Body Dressing/Undressing Lower body dressing/undressing steps patient completed: Thread/unthread right underwear leg;Thread/unthread left underwear leg;Pull underwear up/down;Thread/unthread right pants leg;Thread/unthread left pants leg;Pull pants up/down;Don/Doff left sock;Don/Doff right sock Lower body dressing/undressing: 5: Set-up assist to: Don/Doff TED stocking  FIM - Toileting Toileting steps completed by patient: Adjust clothing prior to toileting;Performs perineal hygiene;Adjust clothing after toileting Toileting Assistive Devices: Grab bar or rail for support Toileting: 4: Steadying assist  FIM - Diplomatic Services operational officer Devices: Best boy Transfers: 5-To toilet/BSC: Supervision (verbal cues/safety issues)  FIM - Banker Devices: Therapist, occupational: 5: Chair or W/C > Bed: Supervision (verbal  cues/safety issues)  FIM - Locomotion: Wheelchair Locomotion: Wheelchair: 0: Activity did not occur FIM - Locomotion: Ambulation Locomotion: Ambulation Assistive Devices: Designer, industrial/product Ambulation/Gait Assistance: 5: Supervision Locomotion: Ambulation: 2: Travels 50 - 149 ft with supervision/safety  issues  Comprehension Comprehension Mode: Auditory Comprehension: 5-Understands complex 90% of the time/Cues < 10% of the time  Expression Expression Mode: Verbal Expression: 5-Expresses basic needs/ideas: With no assist  Social Interaction Social Interaction: 5-Interacts appropriately 90% of the time - Needs monitoring or encouragement for participation or interaction.  Problem Solving Problem Solving: 5-Solves basic 90% of the time/requires cueing < 10% of the time  Memory Memory: 5-Recognizes or recalls 90% of the time/requires cueing < 10% of the time  2. Anticoagulation/DVT prophylaxis with Pharmaceutical: Lovenox 3. Pain Management: mainly for post op pain see below Medical Problem List and Plan:  1. deconditioning following myocardial infarction. 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 Monitor for deep vein thrombosis  3.. Pain Management: Ultram and tylenol as needed. Monitor with increased mobility  4. Post operative anemia. Patient has been transfused. Followup CBC ok 5. Hypertension. Coreg 6.25 mg twice daily, Lasix 40 mg daily, Altace 2.5 mg daily. Monitor with increased activity  6. Hyperlipidemia. Crestor  7. History of bilateral total knee replacements.  8. FEN: nutritional intake poor. Added megace. Ate only 30 percent yesterday. RD f/u 9.  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  Photosensitivity likely med related, D/C benadryl and phenergan  LOS (Days) 6 A FACE TO FACE EVALUATION WAS PERFORMED  [name] E 07/24/2011, 8:35 AM

## 2011-07-24 NOTE — Evaluation (Signed)
Recreational Therapy Assessment and Plan  Patient Details  Name: ARETTA STETZEL MRN: [id number] Date of Birth: 1925/02/18  Rehab Potential:  Good ELOS:   10-14 days  Assessment Clinical Impression: Patient is an 78 y.o. female with history of HTN, 3-week history of rest and exertional dyspnea, admitted 02/01 with sudden onset of chest pain with nausea, vomiting and dyspnea with findings of acute anterior STEMI. Patient taken to cath lab emergently, which revealed large anterior MI with mechanical complication of ventricular septal defect. Patient taken to OR the same day for CABG X2, VSD repair and excision of mediastinal mass. Patient with noted deconditioning after hospital course and easily fatigued. Patient admitted to [facility] 07/19/11 for continued therapies prior to discharge home.  PTA, patient lived with husband in a single level home with level entry. Husband has advanced Parkinson's Disease, and patient was primary caregiver during the day and has hired assistance at night. Per patient, she has increased the number of hours for the hired assistance to be during the day as well. Patient was modified independent with mobility with use of RW PTA and was driving.  Met with pt to discuss leisure interests and pts current activity tolerance.  Pt reports the majority of her time was taking care of her husband.  Pt placed on HOLD for TR services at this time due to decreased activity tolerance.  Offered diversional activities, pt declined, reports fatigue from current level of activity on CIR.  Will continue to monitor through team for future participation.  The above assessment, treatment plan, treatment alternatives and goals were discussed and mutually agreed upon: by patient  Dyanne Yorks 07/24/2011, 9:37 AM

## 2011-07-24 NOTE — Progress Notes (Signed)
Physical Therapy Session Note  Patient Details  Name: [name] MRN: [id number] Date of Birth: 16-Dec-1924  Today's Date: 07/24/2011 Time: 1116-1201 Time Calculation (min): 45 min  Skilled Therapeutic Interventions/Progress Updates:     Therapy Documentation Precautions:  Precautions Precautions: Sternal Required Braces or Orthoses: No Restrictions Weight Bearing Restrictions: No Other Position/Activity Restrictions: monitor O2 during mobility General: Chart Reviewed: Yes Family/Caregiver Present: No Vital Signs:  O2 96% throughout on room air Pain: Pain Assessment Pain Assessment: No/denies pain Pain Score: 0-No pain    Treatments:  Curb negotiation training for home entry with RW x 3 trials close supervision, focus on sequencing and pursed lip breathing to maintain O2 > 90% during mobility. Car transfer training to sedan height x 2 trials min assist, cues for technique and maintaining sternal precautions. Gait with RW 50 feet x 2 supervision for increased activity tolerance, cues for pursed lip breathing to maintain O2 saturation. Throughout session, O2 96% on room air.  See FIM for current functional status  Therapy/Group: Individual Therapy  Romeo Rabon 07/24/2011, 12:03 PM

## 2011-07-24 NOTE — Progress Notes (Signed)
Occupational Therapy Session Note  Patient Details  Name: [name] MRN: [id number] Date of Birth: 1925/03/19  Today's Date: 07/24/2011 Time: 0802-0900 Time Calculation (min): 58 min  Short Term Goals: Week 1:  OT Short Term Goal 1 (Week 1): Pt will tolerate standin at the sink during grooming tasks for 5 mins 3 consecutive sessions. OT Short Term Goal 2 (Week 1): Pt will perform LB bathing with min assist using AE 3 consecutive sessions. OT Short Term Goal 3 (Week 1): Pt will perform LB dressing with min assist using AE 3 consecutive sessions. OT Short Term Goal 4 (Week 1): Pt will perform toileting and toilet transfer with close supervision, using RW and 3:1  Skilled Therapeutic Interventions/Progress Updates:    Worked on bathing and dressing at the sink sit to stand from the wheelchair.  Pt overall min assist with min instructional cues for hand placement with sit to stand and to not push with the UEs.  Utilized AE for LB selfcare with setup only.  Pt also able to perfrom toilet transfer with the RW and use of a 3:1 over the regular toilet.  Pt still with forward head and cervical/trunk flexion in standing.  Therapy Documentation Precautions:  Precautions Precautions: Sternal Required Braces or Orthoses: No Restrictions Weight Bearing Restrictions: No Other Position/Activity Restrictions: monitor O2 during mobility  Pain: Pain Assessment Pain Assessment: No/denies pain Pain Score: 0-No pain ADL: See FIM for current functional status  Therapy/Group: Individual Therapy  Kishon Garriga OTR/L 07/24/2011, 12:13 PM

## 2011-07-25 MED ORDER — ZOLPIDEM TARTRATE 5 MG PO TABS
5.0000 mg | ORAL_TABLET | Freq: Every day | ORAL | Status: DC
  Administered 2011-07-25 – 2011-07-30 (×6): 5 mg via ORAL
  Filled 2011-07-25 (×6): qty 1

## 2011-07-25 NOTE — Progress Notes (Signed)
Patient ID: [name], female   DOB: [birth year], 78 y.o.   MRN: [id number] Subjective/Complaints: Pain controlled.  No photosensitivity reported after D/C benadryl.  Poor sleep Review of Systems  Respiratory: Positive for cough and shortness of breath.   Neurological: Positive for focal weakness.  All other systems reviewed and are negative.   Objective: Vital Signs: Blood pressure 104/67, pulse 80, temperature 98.3 F (36.8 C), temperature source Oral, resp. rate 16, height 5\' 1"  (1.549 m), weight 64.7 kg (142 lb 10.2 oz), SpO2 92.00%. No results found. No results found for this or any previous visit (from the past 72 hour(s)). Physical Exam  Nursing note and vitals reviewed.  Constitutional: She is oriented to person, place, and time. She appears well-developed and well-nourished.  HENT:  Head: Normocephalic and atraumatic.  Eyes: Pupils are equal, round, and reactive to light. eomi  Neck: Normal range of motion. Neck supple.  Cardiovascular: Normal rate and regular rhythm.  Pulmonary/Chest: Effort normal. She has slightly decreased BS bilateral bases. She exhibits no tenderness.  Abdominal: Soft. Bowel sounds are normal.  Musculoskeletal: Normal range of motion.  Neurological: She is alert and oriented to person, place, and time.  Skin: Skin is warm and dry. Chest incision is clean and dry. Some mild breakdown along sacrum, bruising along left leg incision with healing wound. Psychiatric: She has a normal mood and affect but is fatigued. Her behavior is normal. Thought content normal.  motor strength is 4/5 in bilateral deltoid, biceps, triceps, and grip as well as hip flexion, knee extension, ankle dorsiflexion.  Sensation is intact to light touch in bilateral upper and lower extremities, dtr's 1+, no gross cognitive deficits. Cn exam grossly intact  Mood and affect are appropriate  General appears tired but otherwise no acute distress -- anxious.   Assessment/Plan: 1.  Functional deficits secondary to deconditioning following MI and CABG which require 3+ hours per day of interdisciplinary therapy in a comprehensive inpatient rehab setting. Physiatrist is providing close team supervision and 24 hour management of active medical problems listed below. Physiatrist and rehab team continue to assess barriers to discharge/monitor patient progress toward functional and medical goals.  Stood up from recliner with cues and supervision.  FIM: FIM - Bathing Bathing Steps Patient Completed: Chest;Right Arm;Left Arm;Abdomen;Front perineal area;Buttocks;Right upper leg;Left upper leg Bathing: 4: Min-Patient completes 8-9 42f 10 parts or 75+ percent  FIM - Upper Body Dressing/Undressing Upper body dressing/undressing steps patient completed: Thread/unthread left sleeve of front closure shirt/dress;Thread/unthread right sleeve of front closure shirt/dress;Pull shirt around back of front closure shirt/dress;Button/unbutton shirt Upper body dressing/undressing: 5: Set-up assist to: Obtain clothing/put away FIM - Lower Body Dressing/Undressing Lower body dressing/undressing steps patient completed: Thread/unthread left underwear leg;Thread/unthread right underwear leg;Pull pants up/down;Pull underwear up/down;Thread/unthread right pants leg;Don/Doff left sock;Don/Doff right sock;Thread/unthread left pants leg Lower body dressing/undressing: 4: Min-Patient completed 75 plus % of tasks  FIM - Toileting Toileting steps completed by patient: Adjust clothing prior to toileting;Performs perineal hygiene;Adjust clothing after toileting Toileting Assistive Devices: Grab bar or rail for support Toileting: 4: Steadying assist  FIM - Diplomatic Services operational officer Devices: Walker;Elevated toilet seat;Bedside commode Toilet Transfers: 4-To toilet/BSC: Min A (steadying Pt. > 75%)  FIM - Bed/Chair Transfer Bed/Chair Transfer Assistive Devices: Therapist, occupational: 4:  Bed > Chair or W/C: Min A (steadying Pt. > 75%)  FIM - Locomotion: Wheelchair Locomotion: Wheelchair: 1: Total Assistance/staff pushes wheelchair (Pt<25%) FIM - Locomotion: Ambulation Locomotion: Ambulation Assistive Devices: Designer, industrial/product Ambulation/Gait  Assistance: 5: Supervision Locomotion: Ambulation: 2: Travels 50 - 149 ft with supervision/safety issues  Comprehension Comprehension Mode: Auditory Comprehension: 5-Understands complex 90% of the time/Cues < 10% of the time  Expression Expression Mode: Verbal Expression: 5-Expresses basic needs/ideas: With no assist  Social Interaction Social Interaction: 5-Interacts appropriately 90% of the time - Needs monitoring or encouragement for participation or interaction.  Problem Solving Problem Solving: 5-Solves basic problems: With no assist  Memory Memory: 5-Recognizes or recalls 90% of the time/requires cueing < 10% of the time  2. Anticoagulation/DVT prophylaxis with Pharmaceutical: Lovenox 3. Pain Management: mainly for post op pain see below Medical Problem List and Plan:  1. deconditioning following myocardial infarction. 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 Monitor for deep vein thrombosis  3.. Pain Management: Ultram and tylenol as needed. Monitor with increased mobility  4. Post operative anemia. Patient has been transfused. Followup CBC ok 5. Hypertension. Coreg 6.25 mg twice daily, Lasix 40 mg daily, Altace 2.5 mg daily. Monitor with increased activity  6. Hyperlipidemia. Crestor  7. History of bilateral total knee replacements.  8. FEN: nutritional intake poor. Added megace. Ate only 30 percent yesterday. RD f/u 9.  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  Photosensitivity likely med related, D/C benadryl and phenergan 11.  Insomnia trial ambien LOS (Days) 7 A FACE TO FACE EVALUATION WAS  PERFORMED  [name] 07/25/2011, 8:42 AM

## 2011-07-25 NOTE — Progress Notes (Signed)
Occupational Therapy Session Note  Patient Details  Name: [name] MRN: [id number] Date of Birth: 1924-09-03  Today's Date: 07/25/2011 Time: 4098-1191 Time Calculation (min): 47 min  Short Term Goals: Week 1:  OT Short Term Goal 1 (Week 1): Pt will tolerate standin at the sink during grooming tasks for 5 mins 3 consecutive sessions. OT Short Term Goal 2 (Week 1): Pt will perform LB bathing with min assist using AE 3 consecutive sessions. OT Short Term Goal 3 (Week 1): Pt will perform LB dressing with min assist using AE 3 consecutive sessions. OT Short Term Goal 4 (Week 1): Pt will perform toileting and toilet transfer with close supervision, using RW and 3:1  Skilled Therapeutic Interventions/Progress Updates:    Worked on therapeutic activity with focus on sit to stand transitions from edge of mat set at various heights.  Pt needs min assist for sit to stand from edge of mat when not using her UEs.  As the height of the mat increases she is able to perform sit to stand with supervision.  At conclusion of session worked on toileting and toilet transfers using RW and 3:1.  Pt currently at a close supervision level.  Pt not as fatigued as she was earlier in the day.  Therapy Documentation Precautions:  Precautions Precautions: Fall Required Braces or Orthoses: No Restrictions Weight Bearing Restrictions: No Other Position/Activity Restrictions: monitor O2 during mobility  Pain:  No complaint of pain  ADL: See FIM for current functional status  Therapy/Group: Individual Therapy  Christopherjame Carnell OTR/L 07/25/2011, 3:56 PM

## 2011-07-25 NOTE — Progress Notes (Signed)
Physical Therapy Note  Patient Details  Name: [name] MRN: [id number] Date of Birth: Sep 30, 1924 Today's Date: 07/25/2011  13:00- 14:00 group therapy pt denies pain.  Group focused on dynamic balance and standing endurance to play [employer] Just Dance. Pt tolerated standing for 1 minutes intervals with RW minguard assist.   Julian Reil 07/25/2011, 1:35 PM

## 2011-07-25 NOTE — Progress Notes (Signed)
Nutrition Follow-up  Pt continues with poor PO intake. Pt feels weak. Megace added. Consuming approximately 25% of meal trays. Pt states that she is getting in about 2 Ensures a day, this provides 700 kcal and 26 grams protein.  Able to eat foods such as chicken salad. Encouraged pt to order foods that seem most appealing to her.  Diet Order:  CHO Modified Medium Supplements: Ensure Clinical Strength PO TID  Meds: Scheduled Meds:   . aspirin EC  325 mg Oral Daily  . carvedilol  6.25 mg Oral BID WC  . enoxaparin (LOVENOX) injection  40 mg Subcutaneous Daily  . feeding supplement  237 mL Oral TID WC  . furosemide  40 mg Oral BID  . megestrol  400 mg Oral BID  . pantoprazole  40 mg Oral Q1200  . potassium chloride  20 mEq Oral Daily  . ramipril  2.5 mg Oral Daily  . rosuvastatin  5 mg Oral q1800  . senna  1 tablet Oral BID  . traMADol  50 mg Oral Q6H  . zolpidem  5 mg Oral QHS  . DISCONTD: megestrol  400 mg Oral Daily   Continuous Infusions:   . DISCONTD: sodium chloride 0.9 %     PRN Meds:.acetaminophen, guaiFENesin, polyethylene glycol, sodium chloride, sorbitol  Labs:  CMP     Component Value Date/Time   NA 135 07/19/2011 0530   K 4.0 07/19/2011 0530   CL 99 07/19/2011 0530   CO2 26 07/19/2011 0530   GLUCOSE 139* 07/19/2011 0530   BUN 21 07/19/2011 0530   CREATININE 1.09 07/19/2011 0530   CALCIUM 8.8 07/19/2011 0530   PROT 5.4* 07/19/2011 0530   ALBUMIN 2.4* 07/19/2011 0530   AST 19 07/19/2011 0530   ALT 41* 07/19/2011 0530   ALKPHOS 70 07/19/2011 0530   BILITOT 0.9 07/19/2011 0530   GFRNONAA 45* 07/19/2011 0530   GFRAA 52* 07/19/2011 0530     Intake/Output Summary (Last 24 hours) at 07/25/11 1053 Last data filed at 07/25/11 0800  Gross per 24 hour  Intake    740 ml  Output      2 ml  Net    738 ml    Weight Status:  64.7 kg, wt down 0.7 kg x 1 week. BMI 27.  Estimated needs:  1500 - 1700 kcal, 80 - 90 grams protein, 1.5 - 1.7 L/d  Nutrition Dx:  Inadequate oral intake  r/t poor appetite AEB poor meal completion. Ongoing.  Goal:  Pt to consume >/= 75% of meals and supplements. Progressing.  Intervention:  Continue current interventions.  Monitor:  PO intake, weights, labs, I/O's  Adair Laundry Pager #:  [phone]

## 2011-07-25 NOTE — Plan of Care (Signed)
Problem: RH PAIN MANAGEMENT Goal: RH STG PAIN MANAGED AT OR BELOW PT'S PAIN GOAL < 3 1-10 scale  Outcome: Progressing Reports pain is managed around 3/10 with ultram scheduled q6

## 2011-07-25 NOTE — Progress Notes (Signed)
Occupational Therapy Session Note  Patient Details  Name: DANEKA LANTIGUA MRN: [id number] Date of Birth: 24-Jan-1925  Today's Date: 07/25/2011 Time:  -     Short Term Goals: Week 1:  OT Short Term Goal 1 (Week 1): Pt will tolerate standin at the sink during grooming tasks for 5 mins 3 consecutive sessions. OT Short Term Goal 2 (Week 1): Pt will perform LB bathing with min assist using AE 3 consecutive sessions. OT Short Term Goal 3 (Week 1): Pt will perform LB dressing with min assist using AE 3 consecutive sessions. OT Short Term Goal 4 (Week 1): Pt will perform toileting and toilet transfer with close supervision, using RW and 3:1  Skilled Therapeutic Interventions/Progress Updates:    Worked on selfcare retraining at sink level instead of shower secondary to pt being fatigued and reporting that she didn't sleep well at all last night.  Pt min guard assist for sit to stand for LB selfcare.  Still needs min instructional cueing to not grab hold of the sink and attempt to pull up for standing.  Utilizes reacher and sockaide for LB dressing without cueing secondary to not being able to bend forward and reach her feet or bring them high enough to cross them over each knee.  Pt required 4-5 rest breaks and only tolerated 1-2 minute standing intervals.  Feel session was probably impacted secondary to lack of rest/sleep.  Therapy Documentation Precautions:  Precautions Precautions: Fall Required Braces or Orthoses: No Restrictions Weight Bearing Restrictions: No Other Position/Activity Restrictions: monitor O2 during mobility  Vital Signs: Therapy Vitals Pulse Rate: 75  Patient Position, if appropriate: Lying Oxygen Therapy SpO2: 96 % O2 Device: None (Room air) Pulse Oximetry Type: Intermittent Pain: Pain Assessment Pain Assessment: No/denies pain ADL: See FIM for current functional status  Therapy/Group: Individual Therapy  Ahtziry Saathoff OTR/L 07/25/2011, 9:26 AM

## 2011-07-26 MED ORDER — FUROSEMIDE 20 MG PO TABS
20.0000 mg | ORAL_TABLET | Freq: Two times a day (BID) | ORAL | Status: DC
  Administered 2011-07-26 – 2011-07-27 (×2): 20 mg via ORAL
  Filled 2011-07-26 (×4): qty 1

## 2011-07-26 NOTE — Progress Notes (Signed)
Occupational Therapy Weekly Progress Note  Patient Details  Name: [name] MRN: [id number] Date of Birth: 06-12-1924  Today's Date: 07/26/2011 Time: 4098-1191 Time Calculation (min): 1 min  Patient has met 3 of 4 short term goals.    Patient continues to demonstrate the following deficits:  decreased balance, decreased strength, and decreased endurance and therefore will continue to benefit from skilled OT intervention to enhance overall performance with BADL.  Patient progressing toward long term goals..  Continue plan of care.  OT Short Term Goals Week 1:  OT Short Term Goal 1 (Week 1): Pt will tolerate standin at the sink during grooming tasks for 5 mins 3 consecutive sessions. OT Short Term Goal 1 - Progress (Week 1): Not met OT Short Term Goal 2 (Week 1): Pt will perform LB bathing with min assist using AE 3 consecutive sessions. OT Short Term Goal 2 - Progress (Week 1): Met OT Short Term Goal 3 (Week 1): Pt will perform LB dressing with min assist using AE 3 consecutive sessions. OT Short Term Goal 3 - Progress (Week 1): Met OT Short Term Goal 4 (Week 1): Pt will perform toileting and toilet transfer with close supervision, using RW and 3:1 OT Short Term Goal 4 - Progress (Week 1): Met  Skilled Therapeutic Interventions/Progress Updates:    Mrs. Gagan has made great progress with OT this week.  Currently she only needs min assist for washing her feet.  She is supervision level for dressing using AE.  She still needs frequent rest breaks during sessions secondary to decreased endurance.  Recommend use of AE for LB selfcare.  Daughter will purchase prior to discharge home. Pt is almost at complete supervision level for all selfcare and ready for discharge.  Will plan to D/C on 2/22 with 24 hour supervision.  Therapy Documentation Precautions:  Precautions Precautions: Sternal Required Braces or Orthoses: No Restrictions Weight Bearing Restrictions: No Other  Position/Activity Restrictions: monitor O2 during mobility  ADL: ADL Equipment Provided: Reacher;Sock aid Eating: Modified independent Where Assessed-Eating: Wheelchair Grooming: Setup Where Assessed-Grooming: Wheelchair Upper Body Bathing: Setup Where Assessed-Upper Body Bathing: Shower Lower Body Bathing: Maximal assistance Where Assessed-Lower Body Bathing: Shower Upper Body Dressing: Setup Where Assessed-Upper Body Dressing: Wheelchair;Sitting at sink Lower Body Dressing: Setup Where Assessed-Lower Body Dressing: Sitting at sink;Standing at sink;Wheelchair (With use of Chief Executive Officer.) Toileting: Setup Where Assessed-Toileting: Bedside Commode Toilet Transfer: Distant supervision Toilet Transfer Method: Proofreader: Animator Transfer: Close supervison Web designer Method: Ship broker: Transfer tub bench;Walk in Music therapist: Close supervision Film/video editor Method: Designer, industrial/product: Emergency planning/management officer  Therapy/Group: Individual Therapy  Imir Brumbach OTR/L 07/26/2011, 4:00 PM

## 2011-07-26 NOTE — Patient Care Conference (Signed)
Inpatient RehabilitationTeam Conference Note Date: 07/26/2011   Time: 10:30 AM    Patient Name: [name]      Medical Record Number: [id number]  Date of Birth: Jul 18, 1924 Sex: Female         Room/Bed: 4142/4142-01 Payor Info: Payor: [employer] [place] MEDICARE  Plan: BLUE MEDICARE  Product Type: *No Product type*     Admitting Diagnosis: Deconditioned  Admit Date/Time:  07/18/2011  5:44 PM Admission Comments: No comment available   Primary Diagnosis:  Physical deconditioning Principal Problem: Physical deconditioning  Patient Active Problem List  Diagnoses Date Noted  . Physical deconditioning 07/19/2011  . Acute anterior wall MI 07/07/2011  . HTN (hypertension)   . Osteoarthritis of knee   . CAD (coronary artery disease)   . VSD (ventricular septal defect)     Expected Discharge Date: Expected Discharge Date: 07/28/11  Team Members Present: Physician: Dr. Claudette Laws Case Manager Present: Lutricia Horsfall, RN Social Worker Present: Dossie Der, LCSW Nurse Present: Other (comment) Kennon Portela, RN) PT Present: Edson Snowball, PT;Renee Porfirio Oar, PT OT Present:  Perrin Maltese, OT) SLP Present: Fae Pippin, SLP     Current Status/Progress Goal Weekly Team Focus  Medical   Poor sleep, pulmonary edema resolved  Normal fluid balance, improved sleep  I just sleep medications   Bowel/Bladder   contient bowel/bladder  Pt will remain contientent of bowel/bladder  Remain contient   Swallow/Nutrition/ Hydration             ADL's   Close supervision for UB selfcare and min assist for LB selfcare.  supervision for toileting and toilet transfer.  Using AE for LB selfcare as well. Still needs a lot of rest breaks secondary to her endurance.  Goals originally set at supervision level overall.  Endurance for greater independence with ADLs.  Sit to stand for ADLS.  Education of AE for LB selfcre PRN.   Mobility   supervision overall with RW, min assist  supine-sit  supervision overall with RW, min assist supine-sit  endurance and activity tolerance, reinforce sternal precautions   Communication             Safety/Cognition/ Behavioral Observations            Pain   PRN ultram schedule for   Pain will be <3 on pain schale of 1-10  monitor effectiveness of pain   Skin   stage 2 pressure ulcer to sacrum/allevyn in place. Skine tear to right scapula/ tegarderm in place  Free from any new break down  Reposition Q2H off sacrum      *See Interdisciplinary Assessment and Plan and progress notes for long and short-term goals  Barriers to Discharge: Family has not been in for training    Possible Resolutions to Barriers:  Schedule family training    Discharge Planning/Teaching Needs:  Home with hired caregiver, has one currently for her husband at home.  Daughter has been here unsure if has doen any family education      Team Discussion:  Pt's appetite improved with megace. Loose BM's -- will d/c senna. Pt's daughter needs to learn to do dressing change. Will have HH RN. Making good progress toward goals.  Revisions to Treatment Plan:     Continued Need for Acute Rehabilitation Level of Care: The patient requires daily medical management by a physician with specialized training in physical medicine and rehabilitation for the following conditions: Daily direction of a multidisciplinary physical rehabilitation program to ensure safe  treatment while eliciting the highest outcome that is of practical value to the patient.: Yes Daily medical management of patient stability for increased activity during participation in an intensive rehabilitation regime.: Yes Daily analysis of laboratory values and/or radiology reports with any subsequent need for medication adjustment of medical intervention for : Post surgical problems;Cardiac problems  Meryl Dare 07/26/2011, 2:45 PM

## 2011-07-26 NOTE — Progress Notes (Addendum)
Physical Therapy Note  Patient Details  Name: [name] MRN: [id number] Date of Birth: 1924-07-05 Today's Date: 07/26/2011  Time:  14:06-14:59 53 min No pain Group therapy:  Pt seen in group session for socialization and to encourage progression, addressing sit to stand transfers with close supervision, dynamic balance using RW for UE support/min@ and gait training with RW x 60' with supervision.  Tolerated treatment well, needing several rest breaks.   Georges Mouse 07/26/2011, 4:09 PM

## 2011-07-26 NOTE — Progress Notes (Signed)
Physical Therapy Session Note  Patient Details  Name: [name] MRN: [id number] Date of Birth: [birth year]  Today's Date: 07/26/2011 Time: 2956-2130 Time Calculation (min): 47 min  Short Term Goals: Week 1:   progressing toward goals  Skilled Therapeutic Interventions/Progress Updates:     Therapy Documentation Precautions:  Precautions Precautions: Sternal Required Braces or Orthoses: No Restrictions Weight Bearing Restrictions: No Other Position/Activity Restrictions: monitor O2 during mobility General: Chart Reviewed: Yes Family/Caregiver Present: No Vital Signs: Oxygen Therapy SpO2: 94 % O2 Device: None (Room air) Pulse Oximetry Type: Intermittent Pain: Pain Assessment Pain Assessment: No/denies pain Pain Score: 0-No pain    Treatments:  Patient participating in grooming tasks at sink with OT upon entry. Completed grooming at sink in standing 2 x 3 min with supervision for increased standing tolerance and balance reactions. Two verbal cues for sternal precautions. Patient tends to extend right UE and lean on it with fatigue without cues. Gait with RW 50 feet x 2 supervision, focus on RW position and increasing gait distance for increased activity tolerance and functional independence. Seated alternating LAQ and marches during rest breaks for increased LE strength and activity tolerance. Patient very fatigued at end of session. Returned to recliner with supervision.  See FIM for current functional status  Therapy/Group: Individual Therapy  Romeo Rabon 07/26/2011, 10:23 AM

## 2011-07-26 NOTE — Progress Notes (Signed)
Occupational Therapy Session Note  Patient Details  Name: [name] MRN: [id number] Date of Birth: 1924/06/22  Today's Date: 07/26/2011 Time: 4098-1191 Time Calculation (min): 47 min  Short Term Goals: Week 1:  OT Short Term Goal 1 (Week 1): Pt will tolerate standin at the sink during grooming tasks for 5 mins 3 consecutive sessions. OT Short Term Goal 2 (Week 1): Pt will perform LB bathing with min assist using AE 3 consecutive sessions. OT Short Term Goal 3 (Week 1): Pt will perform LB dressing with min assist using AE 3 consecutive sessions. OT Short Term Goal 4 (Week 1): Pt will perform toileting and toilet transfer with close supervision, using RW and 3:1  Skilled Therapeutic Interventions/Progress Updates:    Bathing and dressing session sit to stand shower level.  Pt used reacher for doffing socks, underpants, pants, and for drying off LEs.  Supervision overall for all transfers.  Pt still exhibiting flexed trunk and neck in standing.  Min instructional cues to attempt self correction.  Needs 3-4 rest breaks during session secondary to fatigue.    Therapy Documentation Precautions:  Precautions Precautions: Sternal Required Braces or Orthoses: No Restrictions Weight Bearing Restrictions: No Other Position/Activity Restrictions: monitor O2 during mobility  Vital Signs: Oxygen Therapy SpO2: 94 % O2 Device: None (Room air) Pulse Oximetry Type: Intermittent Pain: Pain Assessment Pain Assessment: No/denies pain Pain Score: 0-No pain ADL: See FIM for current functional status  Therapy/Group: Individual Therapy  Roberts Bon OTR/L 07/26/2011, 10:56 AM

## 2011-07-26 NOTE — Progress Notes (Signed)
Patient ID: DONN WILMOT, female   DOB: 1925/04/24, 78 y.o.   MRN: [id number] Subjective/Complaints: Pain controlled.  No photosensitivity reported after D/C benadryl.  Slept much better on Ambien Review of Systems  Respiratory: Positive for cough and shortness of breath.   Neurological: Positive for focal weakness.  All other systems reviewed and are negative.   Objective: Vital Signs: Blood pressure 100/54, pulse 76, temperature 98 F (36.7 C), temperature source Oral, resp. rate 18, height 5\' 1"  (1.549 m), weight 64.7 kg (142 lb 10.2 oz), SpO2 90.00%. No results found. No results found for this or any previous visit (from the past 72 hour(s)). Physical Exam  Nursing note and vitals reviewed.  Constitutional: She is oriented to person, place, and time. She appears well-developed and well-nourished.  HENT:  Head: Normocephalic and atraumatic.  Eyes: Pupils are equal, round, and reactive to light. eomi  Neck: Normal range of motion. Neck supple.  Cardiovascular: Normal rate and regular rhythm.  Pulmonary/Chest: Effort normal. She has slightly decreased BS bilateral bases. She exhibits no tenderness.  Abdominal: Soft. Bowel sounds are normal.  Musculoskeletal: Normal range of motion.  Neurological: She is alert and oriented to person, place, and time.  Skin: Skin is warm and dry. Chest incision is clean and dry bruising along left leg incision with healing wound. Psychiatric: She has a normal mood and affect but is fatigued. Her behavior is normal. Thought content normal.  motor strength is 4/5 in bilateral deltoid, biceps, triceps, and grip as well as hip flexion, knee extension, ankle dorsiflexion.  Sensation is intact to light touch in bilateral upper and lower extremities, dtr's 1+, no gross cognitive deficits. Cn exam grossly intact  Mood and affect are appropriate  General appears tired but otherwise no acute distress -- anxious.   Assessment/Plan: 1. Functional deficits  secondary to deconditioning following MI and CABG which require 3+ hours per day of interdisciplinary therapy in a comprehensive inpatient rehab setting. Physiatrist is providing close team supervision and 24 hour management of active medical problems listed below. Physiatrist and rehab team continue to assess barriers to discharge/monitor patient progress toward functional and medical goals.  Stood up from recliner with cues and supervision.  FIM: FIM - Bathing Bathing Steps Patient Completed: Chest;Right Arm;Left Arm;Abdomen;Front perineal area;Buttocks;Right upper leg;Left upper leg Bathing: 4: Steadying assist  FIM - Upper Body Dressing/Undressing Upper body dressing/undressing steps patient completed: Thread/unthread right sleeve of front closure shirt/dress;Thread/unthread left sleeve of front closure shirt/dress;Pull shirt around back of front closure shirt/dress;Button/unbutton shirt Upper body dressing/undressing: 5: Set-up assist to: Obtain clothing/put away FIM - Lower Body Dressing/Undressing Lower body dressing/undressing steps patient completed: Thread/unthread left pants leg;Pull underwear up/down;Thread/unthread left underwear leg;Thread/unthread right underwear leg;Thread/unthread right pants leg;Pull pants up/down Lower body dressing/undressing: 4: Min-Patient completed 75 plus % of tasks  FIM - Toileting Toileting steps completed by patient: Adjust clothing prior to toileting;Performs perineal hygiene;Adjust clothing after toileting Toileting Assistive Devices: Grab bar or rail for support Toileting: 5: Supervision: Safety issues/verbal cues  FIM - Diplomatic Services operational officer Devices: Elevated toilet seat;Walker;Grab bars Toilet Transfers: 5-From toilet/BSC: Supervision (verbal cues/safety issues)  FIM - Banker Devices: Therapist, occupational: 4: Sit > Supine: Min A (steadying pt. > 75%/lift 1 leg)  FIM -  Locomotion: Wheelchair Locomotion: Wheelchair: 1: Total Assistance/staff pushes wheelchair (Pt<25%) FIM - Locomotion: Ambulation Locomotion: Ambulation Assistive Devices: Designer, industrial/product Ambulation/Gait Assistance: 5: Supervision Locomotion: Ambulation: 2: Travels 50 - 149 ft with supervision/safety  issues  Comprehension Comprehension Mode: Auditory Comprehension: 5-Understands complex 90% of the time/Cues < 10% of the time  Expression Expression Mode: Verbal Expression: 5-Expresses complex 90% of the time/cues < 10% of the time  Social Interaction Social Interaction: 5-Interacts appropriately 90% of the time - Needs monitoring or encouragement for participation or interaction.  Problem Solving Problem Solving: 5-Solves basic problems: With no assist  Memory Memory: 5-Recognizes or recalls 90% of the time/requires cueing < 10% of the time  2. Anticoagulation/DVT prophylaxis with Pharmaceutical: Lovenox 3. Pain Management: mainly for post op pain see below Medical Problem List and Plan:  1. deconditioning following myocardial infarction. 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 Monitor for deep vein thrombosis  3.. Pain Management: Ultram and tylenol as needed. Monitor with increased mobility  4. Post operative anemia. Patient has been transfused. Followup CBC ok 5. Hypertension. Coreg 6.25 mg twice daily, Lasix 40 mg daily, Altace 2.5 mg daily. Monitor with increased activity, BPs running low will reduce lasix 6. Hyperlipidemia. Crestor  7. History of bilateral total knee replacements.  8. FEN: nutritional intake poor. Added megace. Ate only 30 percent yesterday. RD f/u 9.  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  Photosensitivity likely med related, D/C benadryl and phenergan 11.  Insomnia trial ambien continue LOS (Days) 8 A FACE TO FACE EVALUATION WAS  PERFORMED  Keiasia Christianson E 07/26/2011, 8:57 AM

## 2011-07-27 LAB — BASIC METABOLIC PANEL
CO2: 23 mEq/L (ref 19–32)
Calcium: 9.6 mg/dL (ref 8.4–10.5)
Chloride: 104 mEq/L (ref 96–112)
Creatinine, Ser: 1.8 mg/dL — ABNORMAL HIGH (ref 0.50–1.10)
GFR calc Af Amer: 29 mL/min — ABNORMAL LOW (ref >90)
Sodium: 137 mEq/L (ref 135–145)

## 2011-07-27 MED ORDER — FUROSEMIDE 20 MG PO TABS: 20.0000 mg | ORAL_TABLET | Freq: Two times a day (BID) | ORAL | Status: DC

## 2011-07-27 MED ORDER — SODIUM CHLORIDE 0.45 % IV SOLN: INTRAVENOUS | Status: DC

## 2011-07-27 MED ORDER — SODIUM CHLORIDE 0.45 % IV SOLN
INTRAVENOUS | Status: DC
  Administered 2011-07-27: 10:00:00 via INTRAVENOUS

## 2011-07-27 MED ORDER — TRAMADOL HCL 50 MG PO TABS: 50.0000 mg | ORAL_TABLET | Freq: Four times a day (QID) | ORAL | Status: AC | PRN

## 2011-07-27 MED ORDER — FUROSEMIDE 20 MG PO TABS
20.0000 mg | ORAL_TABLET | Freq: Every day | ORAL | Status: DC
  Administered 2011-07-28: 20 mg via ORAL
  Filled 2011-07-27 (×2): qty 1

## 2011-07-27 NOTE — Progress Notes (Signed)
Occupational Therapy Session Note  Patient Details  Name: [name] MRN: [id number] Date of Birth: 17-Dec-1924  Today's Date: 07/27/2011 Time: 2130-8657 Time Calculation (min): 44 min  Short Term Goals: Short term goals equal long term goals set at supervision  Skilled Therapeutic Interventions/Progress Updates:    Pt much more fatigued and lethargic this session compared to previous sessions.  Required frequent rest breaks to complete bathing and dressing session.  Daughter present and helped educate on pt's current level of assist and need for AE during LB selfcare tasks.  Required min steady assist this am for sit to stand from wheelchair secondary to fatigue.  Decreased selective attention also secondary to this.    Therapy Documentation Precautions:  Precautions Precautions: Sternal Required Braces or Orthoses: No Restrictions Weight Bearing Restrictions: No Other Position/Activity Restrictions: monitor O2 during mobility  ADL:  See FIM for current functional status  Therapy/Group: Individual Therapy  Barnabas Henriques OTR/L 07/27/2011, 12:25 PM

## 2011-07-27 NOTE — Progress Notes (Signed)
Faxed clinical update to [facility] at [facility].

## 2011-07-27 NOTE — Progress Notes (Signed)
Patient ID: [name], female   DOB: 05/22/1925, 78 y.o.   MRN: [id number] Subjective/Complaints: Pain controlled.  No photosensitivity reported after D/C benadryl.  Slept much better on Ambien Review of Systems  Respiratory: Positive for cough and shortness of breath.   Neurological: Positive for focal weakness.  All other systems reviewed and are negative.   Objective: Vital Signs: Blood pressure 106/66, pulse 83, temperature 97.2 F (36.2 C), temperature source Oral, resp. rate 16, height 5\' 1"  (1.549 m), weight 64.6 kg (142 lb 6.7 oz), SpO2 90.00%. No results found. Results for orders placed during the hospital encounter of 07/18/11 (from the past 72 hour(s))  BASIC METABOLIC PANEL     Status: Abnormal   Collection Time   07/27/11  6:30 AM      Component Value Range Comment   Sodium 137  135 - 145 (mEq/L)    Potassium 4.7  3.5 - 5.1 (mEq/L)    Chloride 104  96 - 112 (mEq/L)    CO2 23  19 - 32 (mEq/L)    Glucose, Bld 106 (*) 70 - 99 (mg/dL)    BUN 35 (*) 6 - 23 (mg/dL)    Creatinine, Ser 1.30 (*) 0.50 - 1.10 (mg/dL)    Calcium 9.6  8.4 - 10.5 (mg/dL)    GFR calc non Af Amer 25 (*) >90 (mL/min)    GFR calc Af Amer 29 (*) >90 (mL/min)    Physical Exam  Nursing note and vitals reviewed.  Constitutional: She is oriented to person, place, and time. She appears well-developed and well-nourished.  HENT:  Head: Normocephalic and atraumatic.  Eyes: Pupils are equal, round, and reactive to light. eomi  Neck: Normal range of motion. Neck supple.  Cardiovascular: Normal rate and regular rhythm.  Pulmonary/Chest: Effort normal. She has slightly decreased BS bilateral bases. She exhibits no tenderness.  Abdominal: Soft. Bowel sounds are normal.  Musculoskeletal: Normal range of motion.  Neurological: She is alert and oriented to person, place, and time.  Skin: Skin is warm and dry. Chest incision is clean and dry bruising along left leg incision with healing wound. Psychiatric: She  has a normal mood and affect but is fatigued. Her behavior is normal. Thought content normal.  motor strength is 4/5 in bilateral deltoid, biceps, triceps, and grip as well as hip flexion, knee extension, ankle dorsiflexion.  Sensation is intact to light touch in bilateral upper and lower extremities, dtr's 1+, no gross cognitive deficits. Cn exam grossly intact  Mood and affect are appropriate  General appears tired but otherwise no acute distress -- anxious.   Assessment/Plan: 1. Functional deficits secondary to deconditioning following MI and CABG which require 3+ hours per day of interdisciplinary therapy in a comprehensive inpatient rehab setting. Physiatrist is providing close team supervision and 24 hour management of active medical problems listed below. Physiatrist and rehab team continue to assess barriers to discharge/monitor patient progress toward functional and medical goals.  Stood up from recliner with cues and supervision.  FIM: FIM - Bathing Bathing Steps Patient Completed: Chest;Right Arm;Left Arm;Abdomen;Front perineal area;Buttocks;Right upper leg;Left upper leg Bathing: 4: Min-Patient completes 8-9 22f 10 parts or 75+ percent  FIM - Upper Body Dressing/Undressing Upper body dressing/undressing steps patient completed: Thread/unthread left sleeve of front closure shirt/dress;Thread/unthread right sleeve of front closure shirt/dress;Pull shirt around back of front closure shirt/dress;Button/unbutton shirt Upper body dressing/undressing: 5: Set-up assist to: Obtain clothing/put away FIM - Lower Body Dressing/Undressing Lower body dressing/undressing steps patient completed: Thread/unthread right  underwear leg;Thread/unthread left underwear leg;Pull underwear up/down;Thread/unthread right pants leg;Thread/unthread left pants leg Lower body dressing/undressing: 5: Supervision: Safety issues/verbal cues  FIM - Toileting Toileting steps completed by patient: Adjust clothing  prior to toileting;Performs perineal hygiene;Adjust clothing after toileting Toileting Assistive Devices: Grab bar or rail for support Toileting: 4: Steadying assist  FIM - Diplomatic Services operational officer Devices: Bedside commode;Walker;Grab bars Toilet Transfers: 4-To toilet/BSC: Min A (steadying Pt. > 75%) Toilet Transfers DO NOT USE: 5-To toilet/BSC: Supervision (verbal cues/safety issues)  FIM - Banker Devices: Therapist, occupational: 5: Bed > Chair or W/C: Supervision (verbal cues/safety issues)  FIM - Locomotion: Wheelchair Locomotion: Wheelchair: 1: Total Assistance/staff pushes wheelchair (Pt<25%) FIM - Locomotion: Ambulation Locomotion: Ambulation Assistive Devices: Designer, industrial/product Ambulation/Gait Assistance: 5: Supervision Locomotion: Ambulation: 2: Travels 50 - 149 ft with supervision/safety issues  Comprehension Comprehension Mode: Auditory Comprehension: 5-Understands complex 90% of the time/Cues < 10% of the time  Expression Expression Mode: Verbal Expression: 5-Expresses complex 90% of the time/cues < 10% of the time  Social Interaction Social Interaction: 5-Interacts appropriately 90% of the time - Needs monitoring or encouragement for participation or interaction.  Problem Solving Problem Solving: 5-Solves basic problems: With no assist  Memory Memory: 5-Recognizes or recalls 90% of the time/requires cueing < 10% of the time  2. Anticoagulation/DVT prophylaxis with Pharmaceutical: Lovenox 3. Pain Management: mainly for post op pain see below Medical Problem List and Plan:  1. deconditioning following myocardial infarction. 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 Monitor for deep vein thrombosis  3.. Pain Management: Ultram and tylenol as needed. Monitor with increased mobility  4. Post operative  anemia. Patient has been transfused. Followup CBC ok 5. Hypertension. Coreg 6.25 mg twice daily, Lasix 20 mg daily, Altace 2.5 mg daily. Monitor with increased activity, BPs running low will reduce lasix 6. Hyperlipidemia. Crestor  7. History of bilateral total knee replacements.  8. FEN: nutritional intake poor. Added megace. Ate only 30 percent yesterday. RD f/u 9.  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  Photosensitivity likely med related, D/C benadryl and phenergan 11.  Insomnia trial ambien continue 12.  Pre renal azotemia IVF tonite.  Reduce lasix, BMET in am LOS (Days) 9 A FACE TO FACE EVALUATION WAS PERFORMED  Everardo Voris E 07/27/2011, 8:42 AM

## 2011-07-27 NOTE — Progress Notes (Signed)
Physical Therapy Session Note  Patient Details  Name: [name] MRN: [id number] Date of Birth: 1925/01/10  Today's Date: 07/27/2011 Time: 1100-1140 Time Calculation (min): 40 min   Skilled Therapeutic Interventions/Progress Updates:     Therapy Documentation Precautions:  Precautions Precautions: Sternal Required Braces or Orthoses: No Restrictions Weight Bearing Restrictions: No Other Position/Activity Restrictions: monitor O2 during mobility General: Chart Reviewed: Yes Family/Caregiver Present: Yes (dtr)   Pain: Pain Assessment Pain Assessment: No/denies pain Pain Score: 0-No pain    Treatments:  Daughter present for session this morning for family education. Both patient and daughter verbalized concern re: d/c home tomorrow due to patient's "weakness" and "lack of energy." Child psychotherapist and Investment banker, operational also present. Discussed patient's current status for mobility being supervision for minimal cues for sternal precautions only, as she tends to push and pull up to standing positions without verbal reminder. Reviewed sternal precautions with patient and daughter with verbalized understanding. Also discussed continued use of RW at home for all mobility and follow up home health PT for continued strength and endurance progression. Patient and daughter in agreement with this plan. Patient declined mobility with PT due to fatigue from not sleeping well last night and being dehydrated (currently on IV fluids and encouraging PO intake of fluids). Per MD, patient is medically ready for d/c tomorrow. Patient's daughter to return at 1130 tomorrow morning to hands on education for car transfers and gait with RW. Patient to d/c following morning therapies.  See FIM for current functional status  Therapy/Group: Individual Therapy  Romeo Rabon 07/27/2011, 12:27 PM

## 2011-07-27 NOTE — Progress Notes (Signed)
Physical Therapy Note  Patient Details  Name: [name] MRN: [id number] Date of Birth: Feb 17, 1925 Today's Date: 07/27/2011  1530-1625 (55 minutes) group Pain- no complaint of pain Treatment: Pt participated in PT gait group to improve gait safety/endurance with AD. Pt ambulates 80 feet X 3 with RW min to close supervision with increased time. Pt. Continues to report increased lethargy.   Jace Dowe,JIM 07/27/2011, 4:26 PM

## 2011-07-27 NOTE — Discharge Summary (Signed)
  Discharge summary job # [id number]. Discharge summary addendum job # [id number]

## 2011-07-27 NOTE — Discharge Summary (Signed)
NAMESECRET, KRISTENSEN               ACCOUNT NO.:  [id number]  MEDICAL RECORD NO.:  [id number]  LOCATION:  4142                         FACILITY:  [facility]  PHYSICIAN:  Erick Colace, M.D.DATE OF BIRTH:  Jun 11, 1924  DATE OF ADMISSION:  07/18/2011 DATE OF DISCHARGE:  07/28/2011                              DISCHARGE SUMMARY   DISCHARGE DIAGNOSES: 1. Deconditioning following myocardial infarction.  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 2. Sequential compression devices for deep vein thrombosis     prophylaxis. 3. Postoperative anemia. 4. Hypertension. 5. Hyperlipidemia. 6. History of bilateral total knee replacements.  This is an 78 year old right-handed female with 3-week history of rest and exertional dyspnea admitted on July 07, 2011, with sudden onset of chest pain, nausea, vomiting, shortness of breath the night prior to admission with findings of acute anterior STEMI.  Code STEMI was called. The patient taken to the cath lab emergently by Dr. Eden Emms.  This revealed large anterior MI with mechanical complication of ventricular septal defect.  Dr. Tyrone Sage consulted, patient taken to the operating room the same day for coronary artery bypass grafting x2, VSD repair, and excision of mediastinal mass.  Postoperative anemia transfused.  She was extubated on February 4th.  Pathology report was pending, likely malignant thymoma with planned followup radiation oncology as an outpatient.  The patient was noted deconditioning after hospital course and easily fatigued.  She was admitted for comprehensive rehab program.  PAST MEDICAL HISTORY:  See discharge diagnoses.  ALLERGIES:  None.  SOCIAL HISTORY:  She lives with spouse.  Husband has dementia.  One- level home.  They have a personal care assistant to help with her husband and has good family support.  FUNCTIONAL  HISTORY:  Prior to admission, she is retired.  She does drive.  She was independent with basic activities of daily living and homemaking prior to admission.  FUNCTIONAL STATUS:  Upon admission to rehab services was minimal assist ambulate 60 feet with a rolling walker.  PHYSICAL EXAMINATION:  VITAL SIGNS:  Blood pressure 94/61, pulse 69, respirations 18, temperature 97.1. GENERAL:  This was an alert female in no acute distress.  Well- developed. HEENT:  Normocephalic. LUNGS:  Clear to auscultation. CARDIAC:  Rate controlled. ABDOMEN:  Soft, nontender.  Good bowel sounds. MUSCULOSKELETAL:  Her motor strength is 4/5 bilateral deltoid, biceps, triceps, and grip as well as knee extension, ankle dorsiflexion.  REHABILITATION HOSPITAL COURSE:  The patient was admitted to inpatient rehab services with therapies initiated on a 3-hour daily basis consisting of physical therapy, occupational therapy, and rehabilitation nursing.  The following issues were addressed during the patient's rehabilitation stay.  Pertaining to Ms. 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  Routine sternal precautions were implemented.  She would follow up with Cardiac Surgery. Plan radiation Oncology for suspect malignant thymoma as an outpatient. Sequential compression devices were in place for deep vein thrombosis prophylaxis.  Postoperative anemia, stable with no bleeding episodes. Her blood pressures remained well controlled on  Coreg as well as Altace with no orthostatic changes.  Subcutaneous Lovenox had later been initiated for deep vein thrombosis prophylaxis as her mobility did improve.  She was using Ultram on a limited basis for pain.  The patient received weekly collaborative interdisciplinary team conferences to discuss estimated length of stay, family teaching, and any barriers to discharge.   She was close supervision for upper body self-care, minimal assist for lower body self-care, supervision for toileting and toilet transfers.  She still needed rest breaks, although her endurance had greatly improved.  She was supervision overall with rolling walker, minimal assist for supine to sit.  She was discharged to home with ongoing therapies dictated as per rehab services.  Latest labs showed a sodium 135, potassium 4.0, BUN 21, creatinine 1.09, hemoglobin 9.3, hematocrit 28.5.  DISCHARGE MEDICATIONS AT THE TIME OF DICTATION: 1. Aspirin 325 mg daily. 2. Coreg 6.25 mg twice daily. 3. Lasix 20 mg twice daily. 4. Protonix 40 mg daily. 5. Potassium chloride 20 mEq daily. 6. Altace 2.5 mg daily. 7. Crestor 5 mg daily. 8. Ultram 50 mg every 6 hours as needed for pain.  DIET:  Regular.  SPECIAL INSTRUCTIONS:  The patient should follow up with Dr. Tyrone Sage in relation to her recent chest surgery as well as Dr. Eden Emms of Cardiology Services.  It would be at the discretion of Cardiology Services for Radiation Oncology for suspect malignant thymoma, which the patient will follow up Cardiology Services as an outpatient.  Home health therapies had been arranged.  The patient will follow up with Dr. Claudette Laws at the outpatient rehab center as needed.     Mariam Dollar, P.A.   ______________________________ Erick Colace, M.D.   DA/MEDQ  D:  07/27/2011  T:  07/27/2011  Job:  [id number]

## 2011-07-27 NOTE — Progress Notes (Signed)
Per State Regulation 482.30 This chart was reviewed for medical necessity with respect to the patient's Admission/Duration of stay. Plan for d/c tomorrow. Dtr in for education today Lane-Morgan, Ninfa Linden                 Nurse Care Manager

## 2011-07-27 NOTE — Discharge Instructions (Signed)
Inpatient Rehab Discharge Instructions  [name] [facility] Discharge date and time: No discharge date for patient encounter.   Activities/Precautions/ Functional Status: Activity: activity as tolerated Diet: regular diet Wound Care: none needed Functional status:  ___ No restrictions     ___ Walk up steps independently __x_ 24/7 supervision/assistance   _x__ Walk up steps with assistance ___ Intermittent supervision/assistance  ___ Bathe/dress independently ___ Walk with walker     ___ Bathe/dress with assistance ___ Walk Independently    ___ Shower independently _x__ Walk with assistance    __x_ Shower with assistance ___ No alcohol     ___ Return to work/school ________  Special Instructions: Followup Dr. Mitzi Hansen of radiation oncology [phone] on March 4 at 3 PM in reference to probable malignant thymoma and to establish care   COMMUNITY REFERRALS UPON DISCHARGE:    Home Health:   PT, OT, RN     Agency: [facility] Phone: [phone] Date of last service: 07/28/2011  Medical Equipment/Items Ordered:NONE HAD FROM PREVIOUS ADMITS   IF QUESTIONS OR CONCERNS ARISE, PLEASE CONTACT: BECKY Talasia Saulter,LCSW                                                                                                             [phone] [employer]; CAREGIVER [employer]PPORT [employer]   My questions have been answered and I understand these instructions. I will adhere to these goals and the provided educational materials after my discharge from the hospital.  Patient/Caregiver Signature _______________________________ Date __________  Clinician Signature _______________________________________ Date __________  Please bring this form and your medication list with you to all your follow-up doctor's appointments.

## 2011-07-27 NOTE — Progress Notes (Signed)
Social Work Patient ID: [name], female   DOB: September 08, 1924, 78 y.o.   MRN: [id number]  Met with pt and daughter, daughter expressing concerns regarding pt going home tomorrow. Pt has reached supervision level. Only need is reminder of sternal precautions.  They have a paid caregiver for husband at home currently and will continue once Pt comes home.  Scheduled daughter to come in tomorrow for family education at 11:30.  Discussed alternative options if pt doesn't want to Go home ie, assisted living or NHP. Pt declined both and wants to go home but doesn't  feel ready.  Discussed along with Renee-PT it will take  Time to build her endurance and energy up again she has had major surgery.  Will arrange Home Health followup, pt has all DME.  Continue to Work on discharge for tomorrow.

## 2011-07-28 ENCOUNTER — Inpatient Hospital Stay (HOSPITAL_COMMUNITY): Payer: Medicare Other

## 2011-07-28 DIAGNOSIS — I2510 Atherosclerotic heart disease of native coronary artery without angina pectoris: Secondary | ICD-10-CM

## 2011-07-28 DIAGNOSIS — Q210 Ventricular septal defect: Secondary | ICD-10-CM

## 2011-07-28 LAB — BASIC METABOLIC PANEL
BUN: 34 mg/dL — ABNORMAL HIGH (ref 6–23)
CO2: 18 mEq/L — ABNORMAL LOW (ref 19–32)
Calcium: 9.2 mg/dL (ref 8.4–10.5)
Chloride: 103 mEq/L (ref 96–112)
Creatinine, Ser: 1.54 mg/dL — ABNORMAL HIGH (ref 0.50–1.10)
GFR calc Af Amer: 35 mL/min — ABNORMAL LOW (ref >90)
GFR calc non Af Amer: 30 mL/min — ABNORMAL LOW (ref >90)
Glucose, Bld: 102 mg/dL — ABNORMAL HIGH (ref 70–99)
Potassium: 4.5 mEq/L (ref 3.5–5.1)
Sodium: 135 mEq/L (ref 135–145)

## 2011-07-28 MED ORDER — SODIUM CHLORIDE 0.45 % IV SOLN: INTRAVENOUS | Status: AC

## 2011-07-28 MED ORDER — RAMIPRIL 5 MG PO CAPS
5.0000 mg | ORAL_CAPSULE | Freq: Every day | ORAL | Status: DC
  Administered 2011-07-28 – 2011-07-31 (×4): 5 mg via ORAL
  Filled 2011-07-28 (×5): qty 1

## 2011-07-28 MED ORDER — SODIUM CHLORIDE 0.45 % IV SOLN
INTRAVENOUS | Status: AC
  Administered 2011-07-28 – 2011-07-29 (×2): via INTRAVENOUS

## 2011-07-28 MED ORDER — FUROSEMIDE 20 MG PO TABS: 20.0000 mg | ORAL_TABLET | Freq: Every day | ORAL | Status: DC

## 2011-07-28 MED ORDER — ENOXAPARIN SODIUM 30 MG/0.3ML ~~LOC~~ SOLN
30.0000 mg | Freq: Every day | SUBCUTANEOUS | Status: DC
  Administered 2011-07-29 – 2011-07-31 (×3): 30 mg via SUBCUTANEOUS
  Filled 2011-07-28 (×4): qty 0.3

## 2011-07-28 MED ORDER — FUROSEMIDE 20 MG PO TABS
20.0000 mg | ORAL_TABLET | Freq: Two times a day (BID) | ORAL | Status: DC
  Administered 2011-07-28 – 2011-07-31 (×6): 20 mg via ORAL
  Filled 2011-07-28 (×8): qty 1

## 2011-07-28 NOTE — Progress Notes (Signed)
Patient ID: [name], female   DOB: 1924/11/25, 78 y.o.   MRN: [id number] @ Subjective:  Fatigue and no energy  Objective:  Filed Vitals:   07/26/11 1600 07/27/11 0630 07/27/11 1600 07/28/11 0519  BP: 94/61 106/66 88/60 149/75  Pulse: 69 83 64 99  Temp: 97.1 F (36.2 C) 97.2 F (36.2 C) 97.9 F (36.6 C) 98.3 F (36.8 C)  TempSrc: Oral Oral Oral Oral  Resp: 18 16 20 18   Height:      Weight:      SpO2: 94% 90% 98% 98%    Intake/Output from previous day:  Intake/Output Summary (Last 24 hours) at 07/28/11 1327 Last data filed at 07/28/11 1000  Gross per 24 hour  Intake    980 ml  Output      0 ml  Net    980 ml    Physical Exam: Elderly female with mild tachypnea Decreased BS both lungs right greater than left S1/S2 VSD murmur BS positive Mild bilateral edema Plus 2 PT Neuro nonfocal Skin warm and dry  Lab Results: Basic Metabolic Panel:  Basename 07/28/11 0515 07/27/11 0630  NA 135 137  K 4.5 4.7  CL 103 104  CO2 18* 23  GLUCOSE 102* 106*  BUN 34* 35*  CREATININE 1.54* 1.80*  CALCIUM 9.2 9.6  MG -- --  PHOS -- --   Imaging: Dg Chest 2 View  07/28/2011  *RADIOLOGY REPORT*  Clinical Data: Cough, hypertension  CHEST - 2 VIEW  Comparison: Chest x-ray of 07/19/2011  Findings: There is persistent opacity at the lung bases consistent with effusions and atelectasis.  Pneumonia at the lung bases cannot be excluded.  Mild cardiomegaly is stable.  The right PICC line has been removed.  Median sternotomy sutures are noted from prior CABG.  IMPRESSION: Right PICC line removed.  No change in basilar opacities most consistent with atelectasis and effusions.  Original Report Authenticated By: Juline Patch, M.D.    Cardiac Studies:  ECG:    Telemetry:  Echo: EF 35% small residual VSD through patch No apical thrombus  Medications:     . aspirin EC  325 mg Oral Daily  . carvedilol  6.25 mg Oral BID WC  . enoxaparin (LOVENOX) injection  40 mg Subcutaneous Daily    . feeding supplement  237 mL Oral TID WC  . furosemide  20 mg Oral BID  . megestrol  400 mg Oral BID  . pantoprazole  40 mg Oral Q1200  . potassium chloride  20 mEq Oral Daily  . rosuvastatin  5 mg Oral q1800  . traMADol  50 mg Oral Q6H  . zolpidem  5 mg Oral QHS  . DISCONTD: furosemide  20 mg Oral Daily  . DISCONTD: ramipril  2.5 mg Oral Daily       . sodium chloride    . sodium chloride 75 mL/hr at 07/28/11 1145  . DISCONTD: sodium chloride 75 mL/hr at 07/27/11 1000    Assessment/Plan:  Dyspnea:  EF 35% should be on ACE will restart ramapril.  Continue Lasix.  Ask CVTS Dr Tyrone Sage about possible right  Thoracentesis as effusion appears significant on this side and causing atelectasis MI:  Anterior MI contineu beta blocker and ASA Post CABG to LAD with patch repair of VSD  [name] 07/28/2011, 1:27 PM

## 2011-07-28 NOTE — Progress Notes (Signed)
Physical Therapy Note  Patient Details  Name: [name] MRN: [id number] Date of Birth: Apr 29, 1925 Today's Date: 07/28/2011  Time:  11:32-11:51 19 min  No pain  Individual treatment:  Pt very sleepy, slow to respond to therapist's questions, unable to keep eyes open.  Pt stated she needed to use rest room, assisted to rest room with RW x 15' with min@ x 2, then returned to recliner to rest secondary to unable to participate further, RN aware.   Georges Mouse 07/28/2011, 11:57 AM

## 2011-07-28 NOTE — Progress Notes (Signed)
Social Work Patient ID: [name], female   DOB: May 03, 1925, 78 y.o.   MRN: [id number]   Met with daughter when here to discuss medical issues and MD's following up on. She reports being glad found out now before going home.  Continue to work toward Discharge when medically ready.

## 2011-07-28 NOTE — Progress Notes (Signed)
Patient ID: [name], female   DOB: [birth year], 78 y.o.   MRN: [id number] Subjective/Complaints: Pain controlled.  Feels tired. Doesn't want to go to snf, prefers home but doesn't feel strong enough today.  Review of Systems  Respiratory: Positive for cough.   Cardiovascular: Negative for chest pain.  Neurological: Positive for focal weakness.  All other systems reviewed and are negative.   Objective: Vital Signs: Blood pressure 149/75, pulse 99, temperature 98.3 F (36.8 C), temperature source Oral, resp. rate 18, height 5\' 1"  (1.549 m), weight 64.6 kg (142 lb 6.7 oz), SpO2 98.00%. No results found. Results for orders placed during the hospital encounter of 07/18/11 (from the past 72 hour(s))  BASIC METABOLIC PANEL     Status: Abnormal   Collection Time   07/27/11  6:30 AM      Component Value Range Comment   Sodium 137  135 - 145 (mEq/L)    Potassium 4.7  3.5 - 5.1 (mEq/L)    Chloride 104  96 - 112 (mEq/L)    CO2 23  19 - 32 (mEq/L)    Glucose, Bld 106 (*) 70 - 99 (mg/dL)    BUN 35 (*) 6 - 23 (mg/dL)    Creatinine, Ser 4.09 (*) 0.50 - 1.10 (mg/dL)    Calcium 9.6  8.4 - 10.5 (mg/dL)    GFR calc non Af Amer 25 (*) >90 (mL/min)    GFR calc Af Amer 29 (*) >90 (mL/min)   BASIC METABOLIC PANEL     Status: Abnormal   Collection Time   07/28/11  5:15 AM      Component Value Range Comment   Sodium 135  135 - 145 (mEq/L)    Potassium 4.5  3.5 - 5.1 (mEq/L)    Chloride 103  96 - 112 (mEq/L)    CO2 18 (*) 19 - 32 (mEq/L)    Glucose, Bld 102 (*) 70 - 99 (mg/dL)    BUN 34 (*) 6 - 23 (mg/dL)    Creatinine, Ser 8.11 (*) 0.50 - 1.10 (mg/dL)    Calcium 9.2  8.4 - 10.5 (mg/dL)    GFR calc non Af Amer 30 (*) >90 (mL/min)    GFR calc Af Amer 35 (*) >90 (mL/min)    Physical Exam  Nursing note and vitals reviewed.  Constitutional: She is oriented to person, place, and time. She appears well-developed and well-nourished.  HENT:  Head: Normocephalic and atraumatic.  Eyes: Pupils are  equal, round, and reactive to light. eomi  Neck: Normal range of motion. Neck supple.  Cardiovascular: Normal rate and regular rhythm.  Ext show 1+ edema in RLE Pulmonary/Chest: Effort normal. She has slightly decreased BS bilateral bases. She exhibits no tenderness.  Abdominal: Soft. Bowel sounds are normal.  Musculoskeletal: Normal range of motion.  Neurological: She is alert and oriented to person, place, and time.  Skin: Skin is warm and dry. Chest incision is clean and dry bruising along left leg incision with healing wound. Psychiatric: She has a normal mood and affect but is fatigued. Her behavior is normal. Thought content normal.  motor strength is 4/5 in bilateral deltoid, biceps, triceps, and grip as well as hip flexion, knee extension, ankle dorsiflexion.  Sensation is intact to light touch in bilateral upper and lower extremities, dtr's 1+, no gross cognitive deficits. Cn exam grossly intact  Mood and affect are appropriate  General appears tired but otherwise no acute distress  Assessment/Plan: 1. Functional deficits secondary to deconditioning following MI  and CABG which require 3+ hours per day of interdisciplinary therapy in a comprehensive inpatient rehab setting. Physiatrist is providing close team supervision and 24 hour management of active medical problems listed below. Physiatrist and rehab team continue to assess barriers to discharge/monitor patient progress toward functional and medical goals.  FIM: FIM - Bathing Bathing Steps Patient Completed: Chest;Right Arm;Left Arm;Abdomen;Front perineal area;Buttocks;Right upper leg;Left upper leg Bathing: 4: Min-Patient completes 8-9 85f 10 parts or 75+ percent  FIM - Upper Body Dressing/Undressing Upper body dressing/undressing steps patient completed: Thread/unthread left sleeve of front closure shirt/dress;Thread/unthread right sleeve of front closure shirt/dress;Pull shirt around back of front closure  shirt/dress;Button/unbutton shirt Upper body dressing/undressing: 5: Supervision: Safety issues/verbal cues FIM - Lower Body Dressing/Undressing Lower body dressing/undressing steps patient completed: Thread/unthread right underwear leg;Thread/unthread left underwear leg;Pull underwear up/down;Thread/unthread right pants leg;Thread/unthread left pants leg;Pull pants up/down Lower body dressing/undressing: 4: Steadying Assist  FIM - Toileting Toileting steps completed by patient: Adjust clothing prior to toileting;Performs perineal hygiene;Adjust clothing after toileting Toileting Assistive Devices: Grab bar or rail for support Toileting: 5: Supervision: Safety issues/verbal cues  FIM - Diplomatic Services operational officer Devices: Bedside commode;Walker;Grab bars Toilet Transfers: 4-To toilet/BSC: Min A (steadying Pt. > 75%) Toilet Transfers DO NOT USE: 5-To toilet/BSC: Supervision (verbal cues/safety issues)  FIM - Banker Devices: Therapist, occupational: 4: Bed > Chair or W/C: Min A (steadying Pt. > 75%)  FIM - Locomotion: Wheelchair Locomotion: Wheelchair: 1: Total Assistance/staff pushes wheelchair (Pt<25%) FIM - Locomotion: Ambulation Locomotion: Ambulation Assistive Devices: Designer, industrial/product Ambulation/Gait Assistance: 5: Supervision Locomotion: Ambulation: 2: Travels 50 - 149 ft with supervision/safety issues  Comprehension Comprehension Mode: Auditory Comprehension: 5-Understands complex 90% of the time/Cues < 10% of the time  Expression Expression Mode: Verbal Expression: 5-Expresses complex 90% of the time/cues < 10% of the time  Social Interaction Social Interaction: 4-Interacts appropriately 75 - 89% of the time - Needs redirection for appropriate language or to initiate interaction.  Problem Solving Problem Solving: 5-Solves basic 90% of the time/requires cueing < 10% of the time  Memory Memory: 5-Recognizes or  recalls 90% of the time/requires cueing < 10% of the time  2. Anticoagulation/DVT prophylaxis with Pharmaceutical: Lovenox 3. Pain Management: mainly for post op pain see below Medical Problem List and Plan:  1. deconditioning following myocardial infarction. 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 Monitor for deep vein thrombosis  3.. Pain Management: Ultram and tylenol as needed. Monitor with increased mobility  4. Post operative anemia. Patient has been transfused. Followup CBC ok 5. Hypertension. Coreg 6.25 mg twice daily, Lasix 20 mg increase back to bid, Altace 2.5 mg daily but will D/C due to elevated Creatinine. Monitor 6. Hyperlipidemia. Crestor  7. History of bilateral total knee replacements.  8. FEN: nutritional intake poor. Added megace. Ate only 30 percent yesterday. RD f/u 9.  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  Photosensitivity likely med related, D/C benadryl and phenergan 11.  Insomnia trial ambien continue 12.  Pre renal azotemia IVF some improvement but not back to baseline.  Also some increasing peripheral edema as well.  Will consult nephro, stop altace.  Cont IVF for 12 more hrs.  Re check CXR.  Hold D/C until Monday LOS (Days) 10 A FACE TO FACE EVALUATION WAS PERFORMED  [name] 07/28/2011, 8:09 AM

## 2011-07-28 NOTE — Procedures (Signed)
US guided Rt thoracentesis 800 cc bloody fluid  Pt tolerated well cxr pending stable

## 2011-07-29 ENCOUNTER — Inpatient Hospital Stay (HOSPITAL_COMMUNITY): Payer: Medicare Other

## 2011-07-29 NOTE — Progress Notes (Signed)
Patient requested and was given ambien 5mg  for sleep. Reports sleeping in recliner at home, but unable to get comfortable in hospital recliner. Dressing to sacrum clean, dry and intact. Post scalp area OTA.  Left flank with bandaid s/p thoracentesis. Non-productive cough. BLE's with non-pitting edema. IVF's complete per orders. NSL intact to right hand. Encouraged to use flutter valve and cough and deep breath. Poor endurance. Decreased appetite, encouraged PO intake. Tawanna Solo

## 2011-07-29 NOTE — Progress Notes (Addendum)
Physical Therapy Session Note  Patient Details  Name: [name] MRN: [id number] Date of Birth: 10-Feb-1925  Today's Date: 07/29/2011 Time: 1305-1400 Time Calculation (min): 55 min  Short Term Goals: See LTG's  Skilled Therapeutic Interventions/Progress Updates:    Education with daughter [name] who return demonstrated assisting pt with car transfers, regular transfers, curb, steps, safe RW use and sternal precautions.  She would like brother to go thru education Monday prior to d/c.  Daughter states pt does not sleep in a bed and refused education on bed mobility  Therapy Documentation Precautions:  Precautions Precautions: Fall Precaution Comments: sternal Required Braces or Orthoses: No Restrictions Weight Bearing Restrictions: No Other Position/Activity Restrictions: monitor O2 during mobility  O2 sats >96% thru out  Pain: Pain Assessment Pain Assessment: No/denies pain Mobility: transfers min a without use of Ue's from higher w/c, car and regular chair with instruction to position feet far enough back   Locomotion : gait up to 164 ft! Decreased cadence, instruction to look up, steady step thru pattern Ambulation Ambulation/Gait Assistance: 5: Supervision Assistive device: Rolling walker Ambulation/Gait Assistance Details (indicate cue type and reason): vc's to keep feet in RW on turns Stairs / Additional Locomotion Stairs:  (5 steps. S except one "slip" on last step) Ramp: 4: Min assist Curb: 5: Supervision (with RW)    See FIM for current functional status  Therapy/Group: Individual Therapy  Michaelene Song 07/29/2011, 3:41 PM

## 2011-07-29 NOTE — Progress Notes (Signed)
Patient ID: [name], female   DOB: 11/19/1924, 78 y.o.   MRN: [id number] Patient ID: [name], female   DOB: 1925-04-23, 78 y.o.   MRN: [id number] Subjective/Complaints:  2/23.  Remains weak but no focal complaints. By mouth intake has decreased from 1100 cc 2 days ago to 720 cc yesterday. Remains on IV fluid support with a positive fluid balance of 3153 cc. Chest x-ray yesterday far a right thoracentesis revealed decrease in volume of the right pleural effusion. Examination revealed the patient alert but weak. Chest revealed diminished breath sounds at the bases but fairly clear heart-exam reveals normal S1-S2 without tachycardia abdomen soft and nontender. Extremities reveal no significant edema  BP Readings from Last 3 Encounters:  07/29/11 148/89  07/18/11 102/45  07/18/11 102/45      Review of Systems  Respiratory: Positive for cough.   Cardiovascular: Negative for chest pain.  Neurological: Positive for focal weakness.  All other systems reviewed and are negative.   Objective: Vital Signs: Blood pressure 148/89, pulse 101, temperature 97.5 F (36.4 C), temperature source Oral, resp. rate 18, height 5\' 1"  (1.549 m), weight 142 lb 6.7 oz (64.6 kg), SpO2 93.00%. Dg Chest 1 View  07/28/2011  *RADIOLOGY REPORT*  Clinical Data: Post right thoracentesis  CHEST - 1 VIEW  Comparison: Chest x-ray of 07/28/2011  Findings: The volume of the right pleural effusion has diminished after right thoracentesis.  Bilateral pleural effusions remain with basilar atelectasis.  No pneumothorax is seen.  Cardiomegaly is stable.  Median sternotomy sutures are noted from CABG.  IMPRESSION: Decrease in volume of right pleural effusion after right thoracentesis.  No pneumothorax.  Original Report Authenticated By: Juline Patch, M.D.   Dg Chest 2 View  07/28/2011  *RADIOLOGY REPORT*  Clinical Data: Cough, hypertension  CHEST - 2 VIEW  Comparison: Chest x-ray of 07/19/2011  Findings: There is  persistent opacity at the lung bases consistent with effusions and atelectasis.  Pneumonia at the lung bases cannot be excluded.  Mild cardiomegaly is stable.  The right PICC line has been removed.  Median sternotomy sutures are noted from prior CABG.  IMPRESSION: Right PICC line removed.  No change in basilar opacities most consistent with atelectasis and effusions.  Original Report Authenticated By: Juline Patch, M.D.   US Thoracentesis  07/28/2011  *RADIOLOGY REPORT*  Clinical Data:  Right pleural effusion  ULTRASOUND GUIDED right THORACENTESIS  Comparison:  None  An ultrasound guided thoracentesis was thoroughly discussed with the patient and questions answered.  The benefits, risks, alternatives and complications were also discussed.  The patient understands and wishes to proceed with the procedure.  Written consent was obtained.  Ultrasound was performed to localize and mark an adequate pocket of fluid in the right chest.  The area was then prepped and draped in the normal sterile fashion.  1% Lidocaine was used for local anesthesia.  Under ultrasound guidance a 19 gauge Yueh catheter was introduced.  Thoracentesis was performed.  The catheter was removed and a dressing applied.  Complications:  None  Findings: A total of approximately 800 ml of bloody fluid was removed. A fluid sample was not sent for laboratory analysis.  IMPRESSION: Successful ultrasound guided right thoracentesis yielding 800 ml of pleural fluid.  Read by: Ralene Muskrat, P.A.-C  Original Report Authenticated By: Waynard Reeds, M.D.   Results for orders placed during the hospital encounter of 07/18/11 (from the past 72 hour(s))  BASIC METABOLIC PANEL  Status: Abnormal   Collection Time   07/27/11  6:30 AM      Component Value Range Comment   Sodium 137  135 - 145 (mEq/L)    Potassium 4.7  3.5 - 5.1 (mEq/L)    Chloride 104  96 - 112 (mEq/L)    CO2 23  19 - 32 (mEq/L)    Glucose, Bld 106 (*) 70 - 99 (mg/dL)    BUN 35 (*)  6 - 23 (mg/dL)    Creatinine, Ser 4.09 (*) 0.50 - 1.10 (mg/dL)    Calcium 9.6  8.4 - 10.5 (mg/dL)    GFR calc non Af Amer 25 (*) >90 (mL/min)    GFR calc Af Amer 29 (*) >90 (mL/min)   BASIC METABOLIC PANEL     Status: Abnormal   Collection Time   07/28/11  5:15 AM      Component Value Range Comment   Sodium 135  135 - 145 (mEq/L)    Potassium 4.5  3.5 - 5.1 (mEq/L)    Chloride 103  96 - 112 (mEq/L)    CO2 18 (*) 19 - 32 (mEq/L)    Glucose, Bld 102 (*) 70 - 99 (mg/dL)    BUN 34 (*) 6 - 23 (mg/dL)    Creatinine, Ser 8.11 (*) 0.50 - 1.10 (mg/dL)    Calcium 9.2  8.4 - 10.5 (mg/dL)    GFR calc non Af Amer 30 (*) >90 (mL/min)    GFR calc Af Amer 35 (*) >90 (mL/min)    Physical Exam  Nursing note and vitals reviewed.  Constitutional: She is oriented to person, place, and time. She appears well-developed and well-nourished.  HENT:  Head: Normocephalic and atraumatic.  Eyes: Pupils are equal, round, and reactive to light. eomi  Neck: Normal range of motion. Neck supple.  Cardiovascular: Normal rate and regular rhythm.  Ext show 1+ edema in RLE Pulmonary/Chest: Effort normal. She has slightly decreased BS bilateral bases. She exhibits no tenderness.  Abdominal: Soft. Bowel sounds are normal.  Musculoskeletal: Normal range of motion.  Neurological: She is alert and oriented to person, place, and time.  Skin: Skin is warm and dry. Chest incision is clean and dry bruising along left leg incision with healing wound. Psychiatric: She has a normal mood and affect but is fatigued. Her behavior is normal. Thought content normal.  motor strength is 4/5 in bilateral deltoid, biceps, triceps, and grip as well as hip flexion, knee extension, ankle dorsiflexion.  Sensation is intact to light touch in bilateral upper and lower extremities, dtr's 1+, no gross cognitive deficits. Cn exam grossly intact  Mood and affect are appropriate  General appears tired but otherwise no acute  distress  Assessment/Plan: 1. Functional deficits secondary to deconditioning following MI and CABG which require 3+ hours per day of interdisciplinary therapy in a comprehensive inpatient rehab setting. Physiatrist is providing close team supervision and 24 hour management of active medical problems listed below. Physiatrist and rehab team continue to assess barriers to discharge/monitor patient progress toward functional and medical goals.  FIM: FIM - Bathing Bathing Steps Patient Completed: Chest;Right Arm;Left Arm;Abdomen;Front perineal area;Buttocks;Right upper leg;Left upper leg Bathing: 4: Min-Patient completes 8-9 61f 10 parts or 75+ percent  FIM - Upper Body Dressing/Undressing Upper body dressing/undressing steps patient completed: Thread/unthread left sleeve of front closure shirt/dress;Thread/unthread right sleeve of front closure shirt/dress;Pull shirt around back of front closure shirt/dress;Button/unbutton shirt Upper body dressing/undressing: 5: Supervision: Safety issues/verbal cues FIM - Lower Body Dressing/Undressing Lower  body dressing/undressing steps patient completed: Thread/unthread right underwear leg;Thread/unthread left underwear leg;Pull underwear up/down;Thread/unthread right pants leg;Thread/unthread left pants leg;Pull pants up/down Lower body dressing/undressing: 4: Steadying Assist  FIM - Toileting Toileting steps completed by patient: Adjust clothing prior to toileting;Performs perineal hygiene;Adjust clothing after toileting Toileting Assistive Devices: Grab bar or rail for support Toileting: 5: Supervision: Safety issues/verbal cues  FIM - Diplomatic Services operational officer Devices: Grab bars;Walker Toilet Transfers: 4-To toilet/BSC: Min A (steadying Pt. > 75%) Toilet Transfers DO NOT USE: 5-To toilet/BSC: Supervision (verbal cues/safety issues)  FIM - Banker Devices: Therapist, occupational: 4: Bed  > Chair or W/C: Min A (steadying Pt. > 75%)  FIM - Locomotion: Wheelchair Locomotion: Wheelchair: 1: Total Assistance/staff pushes wheelchair (Pt<25%) FIM - Locomotion: Ambulation Locomotion: Ambulation Assistive Devices: Designer, industrial/product Ambulation/Gait Assistance: 4: Min assist Locomotion: Ambulation: 1: Travels less than 50 ft with minimal assistance (Pt.>75%)  Comprehension Comprehension Mode: Auditory Comprehension: 5-Understands complex 90% of the time/Cues < 10% of the time  Expression Expression Mode: Verbal Expression: 5-Expresses complex 90% of the time/cues < 10% of the time  Social Interaction Social Interaction: 5-Interacts appropriately 90% of the time - Needs monitoring or encouragement for participation or interaction.  Problem Solving Problem Solving: 5-Solves basic 90% of the time/requires cueing < 10% of the time  Memory Memory: 5-Recognizes or recalls 90% of the time/requires cueing < 10% of the time  2. Anticoagulation/DVT prophylaxis with Pharmaceutical: Lovenox 3. Pain Management: mainly for post op pain see below Medical Problem List and Plan:  1. deconditioning following myocardial infarction. 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 Monitor for deep vein thrombosis  3.. Pain Management: Ultram and tylenol as needed. Monitor with increased mobility  4. Post operative anemia. Patient has been transfused. Followup CBC ok 5. Hypertension. Coreg 6.25 mg twice daily, Lasix 20 mg increase back to bid, Altace 2.5 mg daily but will D/C due to elevated Creatinine. Monitor 6. Hyperlipidemia. Crestor  7. History of bilateral total knee replacements.  8. FEN: nutritional intake poor. Added megace. Ate only 30 percent yesterday. RD f/u 9.  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  Photosensitivity likely med related, D/C benadryl and phenergan 11.  Insomnia trial ambien  continue 12.  Pre renal azotemia IVF some improvement but not back to baseline.  Also some increasing peripheral edema as well.  Will consult nephro, stop altace.  Cont IVF for 12 more hrs.  Re check CXR.  Hold D/C until Monday LOS (Days) 11 A FACE TO FACE EVALUATION WAS PERFORMED  Rogelia Boga 07/29/2011, 9:10 AM

## 2011-07-29 NOTE — Plan of Care (Signed)
Problem: RH Ambulation Goal: LTG Patient will ambulate in home environment (PT) LTG: Patient will ambulate in home environment, # of feet with assistance (PT).  Outcome: Adequate for Discharge Pt needs occassional cues for safe RW use on turns

## 2011-07-30 LAB — BASIC METABOLIC PANEL
Calcium: 9.2 mg/dL (ref 8.4–10.5)
GFR calc non Af Amer: 36 mL/min — ABNORMAL LOW (ref >90)
Sodium: 136 mEq/L (ref 135–145)

## 2011-07-30 NOTE — Progress Notes (Signed)
OccupationalTherapy Note  Patient Details  Name: [name] MRN: [id number] Date of Birth: 1925-04-12 Today's Date: 07/30/2011  Time in/out: 1030-115 Total minutes: 45  Pain: Denies pain although patient's facial expressions revealed moderate discomfort during treatment  Skilled Intervention: ADL-retraining seated as sink side with emphasis on observing sternal precautions during transfers, energy conservation, falls prevention, and caregiver ed (daughter present during tx).  Patient able to progress through upper body bathing and lower body bathing to knee level but requires assist with lower leg bathing and dressing, and thoroughness with cleansing buttocks.   Per daughter, prior to treatment patient was sitting on the toilet for nearly 30 minutes (for BM) and was moving slower this date.  Therapy: Individual Session  Georgeanne Nim 07/30/2011, 5:06 PM

## 2011-07-30 NOTE — Progress Notes (Signed)
Patient ID: GARRETT BOWRING, female   DOB: 12-15-1924, 78 y.o.   MRN: [id number] Patient ID: YEHUDIT FULGINITI, female   DOB: 04/17/1925, 78 y.o.   MRN: [id number] Patient ID: TURKESSA OSTROM, female   DOB: [birth year], 78 y.o.   MRN: [id number] Subjective/Complaints:  2/23.  Remains weak but no focal complaints. By mouth intake remains marginal. Complained of mild cough this morning that seems fairly chronic. Results of her post right thoracentesis chest x-ray discussed . Remains on IV fluid support with a positive fluid balance.   Examination revealed the patient alert but weak. Chest revealed diminished breath sounds at the bases but fairly clear except few crackles at the right base ; heart-exam reveals normal S1-S2 without tachycardia; grade 2/6 systolic murmur present;  abdomen soft and nontender. Extremities reveal no significant edema  BP Readings from Last 3 Encounters:  07/30/11 107/68  07/18/11 102/45  07/18/11 102/45      Review of Systems  Respiratory: Positive for cough.   Cardiovascular: Negative for chest pain.  Neurological: Positive for focal weakness.  All other systems reviewed and are negative.   Objective: Vital Signs: Blood pressure 107/68, pulse 65, temperature 97.6 F (36.4 C), temperature source Oral, resp. rate 16, height 5\' 1"  (1.549 m), weight 64.6 kg (142 lb 6.7 oz), SpO2 95.00%. Dg Chest 1 View  07/28/2011  *RADIOLOGY REPORT*  Clinical Data: Post right thoracentesis  CHEST - 1 VIEW  Comparison: Chest x-ray of 07/28/2011  Findings: The volume of the right pleural effusion has diminished after right thoracentesis.  Bilateral pleural effusions remain with basilar atelectasis.  No pneumothorax is seen.  Cardiomegaly is stable.  Median sternotomy sutures are noted from CABG.  IMPRESSION: Decrease in volume of right pleural effusion after right thoracentesis.  No pneumothorax.  Original Report Authenticated By: Juline Patch, M.D.   Dg Chest 2 View  07/29/2011  *RADIOLOGY  REPORT*  Clinical Data: Follow up pleural effusions.  Right thoracentesis yesterday.  CHEST - 2 VIEW 07/29/2011:  Comparison: Two-view chest x-ray yesterday, 07/19/2011 [facility].  Findings: Decrease in size of the right pleural effusion after thoracentesis, though a small to moderate sized effusion persists. Improved aeration in the right lower lobe, though consolidation persists.  Stable moderate sized left pleural effusion and associated consolidation in the left lower lobe.  Prior sternotomy for CABG.  Cardiac silhouette enlarged but stable.  Pulmonary vascularity normal without evidence pulmonary edema.  Degenerative changes involving the thoracic spine.  IMPRESSION: Reduction in size of the right pleural effusion post thoracentesis, though a small to moderate sized effusion persist.  Improved aeration in the right lower lobe, though moderate atelectasis/pneumonia persists.  Stable moderate sized left pleural effusion and associated left lower lobe atelectasis/pneumonia.  No new abnormalities.  Original Report Authenticated By: Arnell Sieving, M.D.   Dg Chest 2 View  07/28/2011  *RADIOLOGY REPORT*  Clinical Data: Cough, hypertension  CHEST - 2 VIEW  Comparison: Chest x-ray of 07/19/2011  Findings: There is persistent opacity at the lung bases consistent with effusions and atelectasis.  Pneumonia at the lung bases cannot be excluded.  Mild cardiomegaly is stable.  The right PICC line has been removed.  Median sternotomy sutures are noted from prior CABG.  IMPRESSION: Right PICC line removed.  No change in basilar opacities most consistent with atelectasis and effusions.  Original Report Authenticated By: Juline Patch, M.D.   US Thoracentesis  07/28/2011  *RADIOLOGY REPORT*  Clinical Data:  Right pleural effusion  ULTRASOUND GUIDED right THORACENTESIS  Comparison:  None  An ultrasound guided thoracentesis was thoroughly discussed with the patient and questions answered.  The benefits, risks,  alternatives and complications were also discussed.  The patient understands and wishes to proceed with the procedure.  Written consent was obtained.  Ultrasound was performed to localize and mark an adequate pocket of fluid in the right chest.  The area was then prepped and draped in the normal sterile fashion.  1% Lidocaine was used for local anesthesia.  Under ultrasound guidance a 19 gauge Yueh catheter was introduced.  Thoracentesis was performed.  The catheter was removed and a dressing applied.  Complications:  None  Findings: A total of approximately 800 ml of bloody fluid was removed. A fluid sample was not sent for laboratory analysis.  IMPRESSION: Successful ultrasound guided right thoracentesis yielding 800 ml of pleural fluid.  Read by: Ralene Muskrat, P.A.-C  Original Report Authenticated By: Waynard Reeds, M.D.   Results for orders placed during the hospital encounter of 07/18/11 (from the past 72 hour(s))  BASIC METABOLIC PANEL     Status: Abnormal   Collection Time   07/28/11  5:15 AM      Component Value Range Comment   Sodium 135  135 - 145 (mEq/L)    Potassium 4.5  3.5 - 5.1 (mEq/L)    Chloride 103  96 - 112 (mEq/L)    CO2 18 (*) 19 - 32 (mEq/L)    Glucose, Bld 102 (*) 70 - 99 (mg/dL)    BUN 34 (*) 6 - 23 (mg/dL)    Creatinine, Ser 1.61 (*) 0.50 - 1.10 (mg/dL)    Calcium 9.2  8.4 - 10.5 (mg/dL)    GFR calc non Af Amer 30 (*) >90 (mL/min)    GFR calc Af Amer 35 (*) >90 (mL/min)    Physical Exam  Nursing note and vitals reviewed.  Constitutional: She is oriented to person, place, and time. She appears well-developed and well-nourished.  HENT:  Head: Normocephalic and atraumatic.  Eyes: Pupils are equal, round, and reactive to light. eomi  Neck: Normal range of motion. Neck supple.  Cardiovascular: Normal rate and regular rhythm.  Ext show 1+ edema in RLE Pulmonary/Chest: Effort normal. She has slightly decreased BS bilateral bases. She exhibits no tenderness.  Abdominal:  Soft. Bowel sounds are normal.  Musculoskeletal: Normal range of motion.  Neurological: She is alert and oriented to person, place, and time.  Skin: Skin is warm and dry. Chest incision is clean and dry bruising along left leg incision with healing wound. Psychiatric: She has a normal mood and affect but is fatigued. Her behavior is normal. Thought content normal.  motor strength is 4/5 in bilateral deltoid, biceps, triceps, and grip as well as hip flexion, knee extension, ankle dorsiflexion.  Sensation is intact to light touch in bilateral upper and lower extremities, dtr's 1+, no gross cognitive deficits. Cn exam grossly intact  Mood and affect are appropriate  General appears tired but otherwise no acute distress  Assessment/Plan: 1. Functional deficits secondary to deconditioning following MI and CABG which require 3+ hours per day of interdisciplinary therapy in a comprehensive inpatient rehab setting. Physiatrist is providing close team supervision and 24 hour management of active medical problems listed below. Physiatrist and rehab team continue to assess barriers to discharge/monitor patient progress toward functional and medical goals.  FIM: FIM - Bathing Bathing Steps Patient Completed: Chest;Right Arm;Left Arm;Abdomen;Front perineal area;Right upper leg;Left upper leg;Buttocks Bathing:  4: Min-Patient completes 8-9 67f 10 parts or 75+ percent  FIM - Upper Body Dressing/Undressing Upper body dressing/undressing steps patient completed: Pull shirt around back of front closure shirt/dress;Thread/unthread left sleeve of front closure shirt/dress;Thread/unthread right sleeve of front closure shirt/dress Upper body dressing/undressing: 5: Set-up assist to: Obtain clothing/put away FIM - Lower Body Dressing/Undressing Lower body dressing/undressing steps patient completed: Thread/unthread left pants leg;Thread/unthread right pants leg;Pull underwear up/down;Thread/unthread left underwear  leg;Thread/unthread right underwear leg;Pull pants up/down Lower body dressing/undressing: 4: Min-Patient completed 75 plus % of tasks  FIM - Toileting Toileting steps completed by patient: Adjust clothing prior to toileting;Performs perineal hygiene;Adjust clothing after toileting Toileting Assistive Devices: Grab bar or rail for support Toileting: 5: Supervision: Safety issues/verbal cues  FIM - Diplomatic Services operational officer Devices: Elevated toilet seat;Grab bars;Walker Toilet Transfers: 5-To toilet/BSC: Supervision (verbal cues/safety issues) Toilet Transfers DO NOT USE: 5-To toilet/BSC: Supervision (verbal cues/safety issues)  FIM - Banker Devices: Therapist, occupational: 4: Supine > Sit: Min A (steadying Pt. > 75%/lift 1 leg);4: Bed > Chair or W/C: Min A (steadying Pt. > 75%);4: Chair or W/C > Bed: Min A (steadying Pt. > 75%)  FIM - Locomotion: Wheelchair Locomotion: Wheelchair: 1: Total Assistance/staff pushes wheelchair (Pt<25%) FIM - Locomotion: Ambulation Locomotion: Ambulation Assistive Devices: Designer, industrial/product Ambulation/Gait Assistance: 5: Supervision Locomotion: Ambulation: 5: Travels 150 ft or more with supervision/safety issues  Comprehension Comprehension Mode: Auditory Comprehension: 5-Understands complex 90% of the time/Cues < 10% of the time  Expression Expression Mode: Verbal Expression: 5-Expresses basic needs/ideas: With extra time/assistive device  Social Interaction Social Interaction: 5-Interacts appropriately 90% of the time - Needs monitoring or encouragement for participation or interaction.  Problem Solving Problem Solving: 5-Solves basic 90% of the time/requires cueing < 10% of the time  Memory Memory: 5-Recognizes or recalls 90% of the time/requires cueing < 10% of the time  2. Anticoagulation/DVT prophylaxis with Pharmaceutical: Lovenox 3. Pain Management: mainly for post op pain see  below Medical Problem List and Plan:  1. deconditioning following myocardial infarction. 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 Monitor for deep vein thrombosis  3.. Pain Management: Ultram and tylenol as needed. Monitor with increased mobility  4. Post operative anemia. Patient has been transfused. Followup CBC ok 5. Hypertension. Coreg 6.25 mg twice daily, Lasix 20 mg increase back to bid, Altace 2.5 mg daily but will D/C due to elevated Creatinine. Monitor 6. Hyperlipidemia. Crestor  7. History of bilateral total knee replacements.  8. FEN: nutritional intake poor. Added megace. Ate only 30 percent yesterday. RD f/u 9.  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  Photosensitivity likely med related, D/C benadryl and phenergan 11.  Insomnia trial ambien continue 12.  Pre renal azotemia IVF some improvement but not back to baseline.  Also some increasing peripheral edema as well.  Will consult nephro, stop altace.  Cont IVF for 12 more hrs.  Re check CXR.  Hold D/C until Monday LOS (Days) 12 A FACE TO FACE EVALUATION WAS PERFORMED  Rogelia Boga 07/30/2011, 8:35 AM

## 2011-07-30 NOTE — Progress Notes (Signed)
Pt to walk with walker and hands on assist  To nurses station and back, tolerated well, sitting up in chair

## 2011-07-30 NOTE — Progress Notes (Signed)
Pt to walk with walker from room 4142 to day room with son, tolerated well, in good spirits

## 2011-07-31 ENCOUNTER — Telehealth (HOSPITAL_COMMUNITY): Payer: Self-pay | Admitting: *Deleted

## 2011-07-31 ENCOUNTER — Inpatient Hospital Stay (HOSPITAL_COMMUNITY): Payer: Medicare Other

## 2011-07-31 LAB — BASIC METABOLIC PANEL
BUN: 20 mg/dL (ref 6–23)
GFR calc Af Amer: 43 mL/min — ABNORMAL LOW (ref >90)
GFR calc non Af Amer: 37 mL/min — ABNORMAL LOW (ref >90)
Potassium: 4.3 mEq/L (ref 3.5–5.1)
Sodium: 135 mEq/L (ref 135–145)

## 2011-07-31 MED ORDER — RAMIPRIL 5 MG PO CAPS: 5.0000 mg | ORAL_CAPSULE | Freq: Every day | ORAL | Status: DC

## 2011-07-31 MED ORDER — FUROSEMIDE 20 MG PO TABS: 20.0000 mg | ORAL_TABLET | Freq: Two times a day (BID) | ORAL | Status: DC

## 2011-07-31 NOTE — Plan of Care (Signed)
Problem: RH Grooming Goal: LTG Patient will perform grooming w/assist,cues/equip (OT) LTG: Patient will perform grooming with assist, with/without cues using equipment (OT)  Outcome: Not Met (add Reason) Supervision level for grooming or standing.  Problem: RH Bathing Goal: LTG Patient will bathe with assist, cues/equipment (OT) LTG: Patient will bathe specified number of body parts with assist with/without cues using equipment (position) (OT)  Outcome: Not Met (add Reason) Needs min assist for bathing feet.  Problem: RH Simple Meal Prep Goal: LTG Patient will perform simple meal prep w/assist (OT) LTG: Patient will perform simple meal prep with assistance, with/without cues (OT).  Outcome: Not Met (add Reason) Goal not addressed.

## 2011-07-31 NOTE — Progress Notes (Signed)
Physical Therapy Session Note  Patient Details  Name: [name] MRN: [id number] Date of Birth: 1924-08-31  Today's Date: 07/31/2011 Time: 1104-1200 Time Calculation (min): 56 min  Short Term Goals: See d/c  Skilled Therapeutic Interventions/Progress Updates:    Pt/family ed, pt verbalized concern for going home with 1 caregiver for her and her husband, daughter present and states she has spoken to caregiver and plan will be okay-and d/c likely today; gait training - velocity .22 ft /sec, increased fall risk <1.8 ft /sec, discussed with pt/daughter, pt performed curb with Rw, steps, gait all with S today and was able to go sit to stand today without assist as well, son did not attend education- had left  Therapy Documentation Precautions:  Precautions Precautions: Fall Precaution Comments: steranl Required Braces or Orthoses: No Restrictions Weight Bearing Restrictions: No Other Position/Activity Restrictions: monitor O2 during mobility    Vital Signs: Therapy Vitals Pulse Rate: 76  BP: 93/53 mmHg Patient Position, if appropriate: Sitting Oxygen Therapy SpO2: 92 % O2 Device: None (Room air) Pain: Pain Assessment Pain Assessment: No/denies pain Mobility: Bed Mobility Bed Mobility: No Transfers Transfers: Yes Sit to Stand: 5: Supervision Sit to Stand Details: Verbal cues for technique Stand to Sit: 5: Supervision Stand to Sit Details (indicate cue type and reason): Verbal cues for technique Stand to Sit Details: Min instructional cueing for hand placement with sit to stand transitions. Locomotion : Ambulation Ambulation/Gait Assistance: 5: Supervision  Trunk/Postural Assessment : Cervical Assessment Cervical Assessment: Within Functional Limits Thoracic Assessment Thoracic Assessment: Exceptions to WFL (kyphosis) Lumbar Assessment Lumbar Assessment: Within Functional Limits Postural Control Postural Control: Within Functional Limits  Balance: Static  Sitting Balance Static Sitting - Balance Support: No upper extremity supported;Feet supported Static Sitting - Level of Assistance: 7: Independent Dynamic Sitting Balance Dynamic Sitting - Balance Support: Feet supported Dynamic Sitting - Level of Assistance: 7: Independent Static Standing Balance Static Standing - Balance Support: Bilateral upper extremity supported Static Standing - Level of Assistance: 6: Modified independent (Device/Increase time) Dynamic Standing Balance Dynamic Standing - Balance Support: During functional activity Dynamic Standing - Level of Assistance: 5: Stand by assistance       See FIM for current functional status  Therapy/Group: Individual Therapy  Michaelene Song 07/31/2011, 1:19 PM

## 2011-07-31 NOTE — Progress Notes (Signed)
Occupational Therapy Discharge Summary  Patient Details  Name: [name] MRN: [id number] Date of Birth: [birth year] Today's Date: 07/31/2011  Patient has met 5 of 9 long term goals due to improved activity tolerance and improved balance.  Patient to discharge at overall Supervision level.  Patient's family is making sure pt will have initial 24 hour supervision  to provide the necessary physical assistance at discharge.    Reasons goals not met:  Goals were all set at a supervision level and currently she needs min assist for LB bathing, supervision for grooming sit to stand, and did not address meal prep goal secondary to limited endurance and assist for cooking will come from caregivers or hired assistance.  Recommendation:  Patient will benefit from ongoing skilled OT services in home health setting to continue to advance functional skills in the area of BADL and iADL.  Feel pt needs continued work on endurance and balance for ADLs.  Still exhibits forward trunk and cervical flexion in standing which causes her to use more energy with mobility.   Equipment: No equipment provided  Reasons for discharge: treatment goals met and discharge from hospital  Patient/family agrees with progress made and goals achieved: Yes  OT Discharge Precautions/Restrictions  Precautions Precautions: Fall Precaution Comments: steranl Required Braces or Orthoses: No Restrictions Weight Bearing Restrictions: No  Pain Pain Assessment Pain Assessment: No/denies pain ADL ADL Equipment Provided: Reacher;Sock aid;Long-handled shoe horn Eating: Independent Where Assessed-Eating: Chair Grooming: Supervision/safety Where Assessed-Grooming: Sitting at sink;Wheelchair Upper Body Bathing: Supervision/safety Where Assessed-Upper Body Bathing: Shower;Chair Lower Body Bathing: Minimal assistance Where Assessed-Lower Body Bathing: Shower Upper Body Dressing: Setup Where Assessed-Upper Body Dressing:  Chair;Sitting at sink Lower Body Dressing: Supervision/safety Where Assessed-Lower Body Dressing: Standing at sink;Sitting at sink Toileting: Supervision/safety Where Assessed-Toileting: Bedside Commode Toilet Transfer: Close supervision;Minimal verbal cueing Toilet Transfer Method: Ambulating Toilet Transfer Equipment: Bedside commode;Grab bars Tub/Shower Transfer: Close supervison Tub/Shower Transfer Method: Ship broker: Transfer tub bench;Walk in Music therapist: Close supervision Film/video editor Method: Designer, industrial/product: Emergency planning/management officer ADL Comments: Pt overall at a close supervision level with selfcare with integration of AE for washing lower legs and donning pants and socks over feet.  Needs increased time secondary to fatigue. Vision/Perception  Vision - History Baseline Vision: Wears glasses all the time Patient Visual Report: No change from baseline  Cognition Overall Cognitive Status: Appears within functional limits for tasks assessed Arousal/Alertness: Awake/alert Orientation Level: Oriented X4 Sustained Attention: Appears intact Memory: Appears intact Awareness: Appears intact Problem Solving: Appears intact Comments: pt tens to keep eyes closed, states MD does not know why, bagan 1 wk ago, slow to initiate movment and speech Sensation Sensation Light Touch: Appears Intact Stereognosis: Not tested Hot/Cold: Not tested Proprioception: Appears Intact Coordination Gross Motor Movements are Fluid and Coordinated: Yes Fine Motor Movements are Fluid and Coordinated: Yes   Mobility  Bed Mobility Bed Mobility: No Transfers Transfers: Yes Sit to Stand: 5: Supervision Sit to Stand Details: Verbal cues for technique Stand to Sit: 5: Supervision Stand to Sit Details (indicate cue type and reason): Verbal cues for technique Stand to Sit Details: Min instructional cueing for hand placement with sit to  stand transitions.    Balance Pt overall demonstrates static and dynamic standing balance at a supervision level for most selfcare tasks.  She continues to need UE support with the walker or stable surface when standing. See FIM for current functional status  Evamae Rowen OTR/L 07/31/2011, 1:27 PM

## 2011-07-31 NOTE — Discharge Summary (Signed)
NAMEALEXCIS, BICKING               ACCOUNT NO.:  [id number]  MEDICAL RECORD NO.:  [id number]  LOCATION:  4142                         FACILITY:  [facility]  PHYSICIAN:  Erick Colace, M.D.DATE OF BIRTH:  1924/11/02  DATE OF ADMISSION:  07/18/2011 DATE OF DISCHARGE:  07/31/2011                              DISCHARGE SUMMARY   ADDENDUM  DISCHARGE ADDENDUM:  The patient was scheduled for discharge home on July 28, 2011, however on the morning of July 28, 2011, followup routine chemistry showed a mildly elevated creatinine of 1.80.  Her ACE inhibitor was held.  Her Lasix had been decreased to 40 mg daily.  She was placed on low-dose intravenous fluids, creatinine improved to 1.54, however, her chest x-ray was also completed showing a right pleural effusion, underwent thoracentesis by Interventional Radiology with 800 mL of bloody fluid removed.  Cardiology Services followed up, advised in light of her cardiac condition, an ACE inhibitor should be ongoing, resumed Altace of 5 mg daily.  Her creatinine function remained stable and improved to 1.29 by 2.25.  Followup chest x-ray revealed decreased, but small persistent moderate-sized bilateral pleural effusion.  Her oxygen saturations were greater than 90% on room air.  She was supervision overall for her mobility, strength, and endurance greatly improved.  All issues in regards to latest followup labs were discussed with family.  She was discharged to home with home health therapies as advised.  No other changes were made in her medication list of previous discharge summary.     Mariam Dollar, P.A.   ______________________________ Erick Colace, M.D.    DA/MEDQ  D:  07/31/2011  T:  07/31/2011  Job:  [id number]

## 2011-07-31 NOTE — Progress Notes (Signed)
Patient ID: CORINTHIA HELMERS, female   DOB: 03-29-1925, 78 y.o.   MRN: [id number] Patient ID: SAYGE BRIENZA, female   DOB: 06/29/1924, 78 y.o.   MRN: [id number] @ Subjective:  Energy level, appetite and breathing improved  Objective:  Filed Vitals:   07/29/11 1513 07/30/11 0622 07/30/11 1523 07/31/11 0613  BP: 104/65 107/68 111/67 110/71  Pulse: 76 65 68 87  Temp: 97.3 F (36.3 C) 97.6 F (36.4 C) 97.5 F (36.4 C) 97.9 F (36.6 C)  TempSrc: Oral Oral Oral Oral  Resp: 17 16 19 18   Height:      Weight:      SpO2: 96% 95% 96% 95%    Intake/Output from previous day:  Intake/Output Summary (Last 24 hours) at 07/31/11 0801 Last data filed at 07/30/11 2200  Gross per 24 hour  Intake    840 ml  Output      0 ml  Net    840 ml    Physical Exam: Elderly female with mild tachypnea Decreased BS both lungs right greater than left S1/S2 VSD murmur BS positive Mild bilateral edema Plus 2 PT Neuro nonfocal Skin warm and dry  Lab Results: Basic Metabolic Panel:  Basename 07/30/11 0853  NA 136  K 4.4  CL 104  CO2 22  GLUCOSE 155*  BUN 24*  CREATININE 1.32*  CALCIUM 9.2  MG --  PHOS --   Imaging: Dg Chest 2 View  07/29/2011  *RADIOLOGY REPORT*  Clinical Data: Follow up pleural effusions.  Right thoracentesis yesterday.  CHEST - 2 VIEW 07/29/2011:  Comparison: Two-view chest x-ray yesterday, 07/19/2011 [facility].  Findings: Decrease in size of the right pleural effusion after thoracentesis, though a small to moderate sized effusion persists. Improved aeration in the right lower lobe, though consolidation persists.  Stable moderate sized left pleural effusion and associated consolidation in the left lower lobe.  Prior sternotomy for CABG.  Cardiac silhouette enlarged but stable.  Pulmonary vascularity normal without evidence pulmonary edema.  Degenerative changes involving the thoracic spine.  IMPRESSION: Reduction in size of the right pleural effusion post thoracentesis,  though a small to moderate sized effusion persist.  Improved aeration in the right lower lobe, though moderate atelectasis/pneumonia persists.  Stable moderate sized left pleural effusion and associated left lower lobe atelectasis/pneumonia.  No new abnormalities.  Original Report Authenticated By: Arnell Sieving, M.D.    Cardiac Studies:  ECG:    Telemetry:  Echo: EF 35% small residual VSD through patch No apical thrombus  Medications:      . aspirin EC  325 mg Oral Daily  . carvedilol  6.25 mg Oral BID WC  . enoxaparin (LOVENOX) injection  30 mg Subcutaneous Daily  . feeding supplement  237 mL Oral TID WC  . furosemide  20 mg Oral BID  . megestrol  400 mg Oral BID  . pantoprazole  40 mg Oral Q1200  . potassium chloride  20 mEq Oral Daily  . ramipril  5 mg Oral Daily  . rosuvastatin  5 mg Oral q1800  . traMADol  50 mg Oral Q6H  . zolpidem  5 mg Oral QHS       Assessment/Plan:  Dyspnea:  EF 35% do not stop ACE.  Lasix held   Ask CVTS  Good results from thoracentesis MI:  Anterior MI contineu beta blocker and ASA Post CABG to LAD with patch repair of VSD Rehab:  Improved appetite and energy level Son in visiting form [place]  [name] 07/31/2011, 8:01 AM

## 2011-07-31 NOTE — Progress Notes (Addendum)
Per State Regulation 482.30 This chart was reviewed for medical necessity with respect to the patient's Admission/Duration of stay. Discharge postponed due to multiple medical issues. Monitoring labs and adjusting labs. D/C date pending. Pt continuing to participate in therapies. Pt weak and slow with ambulation. Family has been present for education.  Meryl Dare                 Nurse Care Manager            Next Review Date: 08/04/11

## 2011-07-31 NOTE — Progress Notes (Signed)
Occupational Therapy Session Note  Patient Details  Name: [name] MRN: [id number] Date of Birth: 1924/07/30  Today's Date: 07/31/2011 Time: 1104-1200 Time Calculation (min): 56 min  Short Term Goals: Week 2:  OT Short Term Goal 1 (Week 2): Short term goals equal long term goals all set at supervision level.  Skilled Therapeutic Interventions/Progress Updates:    Worked on selfcare retraining shower level.  Focus on use of AE for LB selfcare as well.  Daughter present for session and observed.  Overall pt only needs min assist for washing feet because she doesn't have a long handle sponge with her at the hospital.  Her daughter reports that she has all AE now at home for use.  Pt still needs multiple rest breaks to complete B/D.  Recommend continued work on this to increase speed and endurance with activity via home health.  Discharge planned today.  Therapy Documentation Precautions:  Precautions Precautions: Fall Precaution Comments: steranl Required Braces or Orthoses: No Restrictions Weight Bearing Restrictions: No Other Position/Activity Restrictions: monitor O2 during mobility  Pain: Pain Assessment Pain Assessment: No/denies pain ADL: ADL Equipment Provided: Reacher;Sock aid;Long-handled shoe horn Eating: Independent Where Assessed-Eating: Chair Grooming: Supervision/safety Where Assessed-Grooming: Sitting at sink;Wheelchair Upper Body Bathing: Supervision/safety Where Assessed-Upper Body Bathing: Shower;Chair Lower Body Bathing: Minimal assistance Where Assessed-Lower Body Bathing: Shower Upper Body Dressing: Setup Where Assessed-Upper Body Dressing: Chair;Sitting at sink Lower Body Dressing: Supervision/safety Where Assessed-Lower Body Dressing: Standing at sink;Sitting at sink Toileting: Supervision/safety Where Assessed-Toileting: Bedside Commode Toilet Transfer: Close supervision;Minimal verbal cueing Toilet Transfer Method: Ambulating Toilet Transfer  Equipment: Bedside commode;Grab bars Tub/Shower Transfer: Close supervison Tub/Shower Transfer Method: Ship broker: Transfer tub bench;Walk in Music therapist: Close supervision Film/video editor Method: Designer, industrial/product: Emergency planning/management officer ADL Comments: Pt overall at a close supervision level with selfcare with integration of AE for washing lower legs and donning pants and socks over feet.  Needs increased time secondary to fatigue.  See FIM for current functional status  Therapy/Group: Individual Therapy  Jonise Weightman OTR/L 07/31/2011, 3:31 PM

## 2011-07-31 NOTE — Telephone Encounter (Signed)
Post procedure, follow up call.  Spoke with pt's son, says she's doing well.  Just got home this afternoon.  No problems

## 2011-07-31 NOTE — Progress Notes (Signed)
Encouraged patient to use flutter valve. Ambulating to bathroom to void. Needs some assist with adjusting clothes. Encouraging PO intake. C/O bilateral hip pain at times, managed with every 6 hour ultram. SCD to RLE. Allevyn dressing to sacrum. Transparent dressing to left forearm. Taking meds whole with applesauce.[name]

## 2011-07-31 NOTE — Progress Notes (Signed)
Social Work Discharge Note Discharge Note  The overall goal for the admission was met for:   Discharge location: Yes-HOME WITH 24 HOUR CAREGIVER FOR BOTH SHE AND HUSBAND  Length of Stay: Yes-13 DAYS  Discharge activity level: Yes-SUPERVISION LEVEL  Home/community participation: Yes  Services provided included: MD, RD, PT, OT, SLP, RN, CM, TR, Pharmacy and SW  Financial Services: Private Insurance: BLUE MEDICARE  Follow-up services arranged: Home Health: [facility]-PT,OT, RN and Patient/Family request agency HH: HAD THEM BEFORE, DME: NO NEEDS  Comments (or additional information):MEDICAL ISSUES RESOLVED AND READY FOR DISCHARGE Patient/Family verbalized understanding of follow-up arrangements: Yes  Individual responsible for coordination of the follow-up plan: NINA-DAUGHTER Confirmed correct DME delivered: [name] 07/31/2011    [name]

## 2011-07-31 NOTE — Progress Notes (Signed)
Patient discharged home with family.  Discharge instructions provided by Harvel Ricks, PA.  Patient and family verbalized understanding.  Patient escorted off unit in wheelchair and with personal belongings by Shanon Payor, NT.

## 2011-07-31 NOTE — Plan of Care (Signed)
Problem: RH Ambulation Goal: LTG Patient will ambulate in controlled environment (PT) LTG: Patient will ambulate in a controlled environment, # of feet with assistance (PT).  Outcome: Adequate for Discharge Pt needs S  Problem: RH Other (Specify) Goal: RH LTG Other (Specify) Outcome: Not Met (add Reason) Memory goal not met, pt requires cues d/t impaired memory for new information

## 2011-07-31 NOTE — Progress Notes (Signed)
Physical Therapy Discharge Summary  Patient Details  Name: [name] MRN: [id number] Date of Birth: Mar 16, 1925 Today's Date: 07/31/2011  Patient has met 4 of 7 long term goals due to improved activity tolerance, improved balance, ability to compensate for deficits and adherance to sternal precautions.   Patient to discharge at an ambulatory level S-min A.  Patient's care partner is independent to provide the necessary physical and cognitive assistance at discharge. Daughter [name] attended education and return demonstrated I assisting pt with mobility.  Pt d/c was delayed several days d/t medical issues.  Reasons goals not met: pt needs S with gait, very slow cadence with poor functional efficiency and intermittent cues to keep feet within RW; memory goal not met as pt still needed instructional cues Recommendation:  Patient will benefit from ongoing skilled PT services in home health setting to continue to advance safe functional mobility, address ongoing impairments in activity tolerance and functional muscular endurance, and minimize fall risk.  Equipment: No equipment provided  Reasons for discharge: discharge from hospital  Patient/family agrees with progress made and goals achieved: Yes  PT Discharge Precautions/Restrictions Precautions Precautions: Fall Precaution Comments: stenal Required Braces or Orthoses: No Restrictions Weight Bearing Restrictions: No Vital Signs Therapy Vitals Pulse Rate: 76  BP: 93/53 mmHg Patient Position, if appropriate: Sitting Oxygen Therapy SpO2: 92 % O2 Device: None (Room air) dypsnea 2/4 even at rest Pain Pain Assessment Pain Assessment: No/denies pain Vision/Perception  Vision - History Baseline Vision: Wears glasses all the time Patient Visual Report: No change from baseline  Cognition Overall Cognitive Status: Appears within functional limits for tasks assessed with cues Arousal/Alertness: Awake/alert Orientation Level: Oriented  X4 Divided attention intact, no increased LOB with conversation Memory: decreased for new information Awareness: Appears intact Problem Solving: cues for proper RW use Comments: pt tens to keep eyes closed, states MD does not know why, bagan 1 wk ago, slow to initiate movment and speech Sensation Sensation Light Touch: Appears Intact Stereognosis: Not tested Hot/Cold: Not tested Proprioception: Appears Intact Coordination Gross Motor Movements are Fluid and Coordinated: Yes Fine Motor Movements are Fluid and Coordinated: Yes Motor  Motor Motor: Within Functional Limits Motor - Discharge Observations: decreased cardiorespiratory endurance, decreased muscular power  Mobility Bed Mobility Bed Mobility: No Transfers Transfers: Yes Sit to Stand: 5: Supervision Sit to Stand Details: Verbal cues for technique Stand to Sit: 5: Supervision Stand to Sit Details (indicate cue type and reason): Verbal cues for technique Stand to Sit Details: Min instructional cueing for hand placement with sit to stand transitions. Locomotion  Ambulation Ambulation/Gait Assistance: 5: Supervision  Trunk/Postural Assessment  Cervical Assessment Cervical Assessment: Within Functional Limits Thoracic Assessment Thoracic Assessment: Exceptions to WFL (kyphosis) Lumbar Assessment Lumbar Assessment: Within Functional Limits Postural Control Postural Control: Within Functional Limits  Balance Static Sitting Balance Static Sitting - Balance Support: No upper extremity supported;Feet supported Static Sitting - Level of Assistance: 7: Independent Dynamic Sitting Balance Dynamic Sitting - Balance Support: Feet supported Dynamic Sitting - Level of Assistance: 7: Independent Static Standing Balance Static Standing - Balance Support: Bilateral upper extremity supported Static Standing - Level of Assistance: 6: Modified independent (Device/Increase time) Dynamic Standing Balance Dynamic Standing - Balance  Support: During functional activity Dynamic Standing - Level of Assistance: 5: Stand by assistance Extremity Assessment  RUE Assessment RUE Assessment: Within Functional Limits LUE Assessment LUE Assessment: Within Functional Limits RLE Assessment RLE Assessment: Exceptions to [facility] RLE Strength RLE Overall Strength Comments: strenght functional but decreased power and  endurance LLE Assessment LLE Assessment: Exceptions to [facility] LLE Strength LLE Overall Strength Comments: strenght functional for mobility but decreased power and muscular endurance  See FIM for current functional status  Michaelene Song 07/31/2011, 1:29 PM

## 2011-07-31 NOTE — Progress Notes (Signed)
Patient ID: [name], female   DOB: 1924-09-11, 78 y.o.   MRN: [id number] Subjective/Complaints: Pain controlled.  Feels tired. No SOB or chest pain.  Review of Systems  Respiratory: Positive for cough.   Cardiovascular: Negative for chest pain.  Neurological: Positive for focal weakness.  All other systems reviewed and are negative.   Objective: Vital Signs: Blood pressure 110/71, pulse 87, temperature 97.9 F (36.6 C), temperature source Oral, resp. rate 18, height 5\' 1"  (1.549 m), weight 64.6 kg (142 lb 6.7 oz), SpO2 95.00%. Dg Chest 2 View  07/29/2011  *RADIOLOGY REPORT*  Clinical Data: Follow up pleural effusions.  Right thoracentesis yesterday.  CHEST - 2 VIEW 07/29/2011:  Comparison: Two-view chest x-ray yesterday, 07/19/2011 [facility].  Findings: Decrease in size of the right pleural effusion after thoracentesis, though a small to moderate sized effusion persists. Improved aeration in the right lower lobe, though consolidation persists.  Stable moderate sized left pleural effusion and associated consolidation in the left lower lobe.  Prior sternotomy for CABG.  Cardiac silhouette enlarged but stable.  Pulmonary vascularity normal without evidence pulmonary edema.  Degenerative changes involving the thoracic spine.  IMPRESSION: Reduction in size of the right pleural effusion post thoracentesis, though a small to moderate sized effusion persist.  Improved aeration in the right lower lobe, though moderate atelectasis/pneumonia persists.  Stable moderate sized left pleural effusion and associated left lower lobe atelectasis/pneumonia.  No new abnormalities.  Original Report Authenticated By: Arnell Sieving, M.D.   Results for orders placed during the hospital encounter of 07/18/11 (from the past 72 hour(s))  BASIC METABOLIC PANEL     Status: Abnormal   Collection Time   07/30/11  8:53 AM      Component Value Range Comment   Sodium 136  135 - 145 (mEq/L)    Potassium 4.4   3.5 - 5.1 (mEq/L)    Chloride 104  96 - 112 (mEq/L)    CO2 22  19 - 32 (mEq/L)    Glucose, Bld 155 (*) 70 - 99 (mg/dL)    BUN 24 (*) 6 - 23 (mg/dL)    Creatinine, Ser 4.09 (*) 0.50 - 1.10 (mg/dL)    Calcium 9.2  8.4 - 10.5 (mg/dL)    GFR calc non Af Amer 36 (*) >90 (mL/min)    GFR calc Af Amer 42 (*) >90 (mL/min)    Physical Exam  Nursing note and vitals reviewed.  Constitutional: She is oriented to person, place, and time. She appears well-developed and well-nourished.  HENT:  Head: Normocephalic and atraumatic.  Eyes: Pupils are equal, round, and reactive to light. eomi  Neck: Normal range of motion. Neck supple.  Cardiovascular: Normal rate and regular rhythm.  Ext show 1+ edema in RLE Pulmonary/Chest: Effort normal. She has slightly decreased BS bilateral bases. She exhibits no tenderness.  Abdominal: Soft. Bowel sounds are normal.  Musculoskeletal: Normal range of motion.  Neurological: She is alert and oriented to person, place, and time.  Skin: Skin is warm and dry. Chest incision is clean and dry bruising along left leg incision with healing wound. Psychiatric: She has a normal mood and affect but is fatigued. Her behavior is normal. Thought content normal.  motor strength is 4/5 in bilateral deltoid, biceps, triceps, and grip as well as hip flexion, knee extension, ankle dorsiflexion.  Sensation is intact to light touch in bilateral upper and lower extremities, dtr's 1+, no gross cognitive deficits. Cn exam grossly intact  Mood and affect  are appropriate  General appears tired but otherwise no acute distress  Assessment/Plan: 1. Functional deficits secondary to deconditioning following MI and CABG which require 3+ hours per day of interdisciplinary therapy in a comprehensive inpatient rehab setting. Physiatrist is providing close team supervision and 24 hour management of active medical problems listed below. Physiatrist and rehab team continue to assess barriers to  discharge/monitor patient progress toward functional and medical goals.  FIM: FIM - Bathing Bathing Steps Patient Completed: Chest;Right Arm;Left Arm;Abdomen;Front perineal area;Right upper leg;Left upper leg Bathing: 3: Mod-Patient completes 5-7 86f 10 parts or 50-74%  FIM - Upper Body Dressing/Undressing Upper body dressing/undressing steps patient completed: Thread/unthread right sleeve of front closure shirt/dress;Thread/unthread left sleeve of front closure shirt/dress Upper body dressing/undressing: 3: Mod-Patient completed 50-74% of tasks FIM - Lower Body Dressing/Undressing Lower body dressing/undressing steps patient completed: Pull underwear up/down;Pull pants up/down Lower body dressing/undressing: 1: Total-Patient completed less than 25% of tasks  FIM - Toileting Toileting steps completed by patient: Performs perineal hygiene Toileting Assistive Devices: Grab bar or rail for support Toileting: 2: Max-Patient completed 1 of 3 steps  FIM - Diplomatic Services operational officer Devices: Elevated toilet seat;Grab bars;Walker Toilet Transfers: 5-To toilet/BSC: Supervision (verbal cues/safety issues) Toilet Transfers DO NOT USE: 5-To toilet/BSC: Supervision (verbal cues/safety issues)  FIM - Banker Devices: Therapist, occupational: 5: Supine > Sit: Supervision (verbal cues/safety issues);5: Bed > Chair or W/C: Supervision (verbal cues/safety issues);5: Chair or W/C > Bed: Supervision (verbal cues/safety issues)  FIM - Locomotion: Wheelchair Locomotion: Wheelchair: 1: Total Assistance/staff pushes wheelchair (Pt<25%) FIM - Locomotion: Ambulation Locomotion: Ambulation Assistive Devices: Designer, industrial/product Ambulation/Gait Assistance: 5: Supervision Locomotion: Ambulation: 5: Travels 150 ft or more with supervision/safety issues  Comprehension Comprehension Mode: Auditory Comprehension: 5-Follows basic conversation/direction: With  extra time/assistive device  Expression Expression Mode: Verbal Expression: 5-Expresses basic needs/ideas: With extra time/assistive device  Social Interaction Social Interaction: 5-Interacts appropriately 90% of the time - Needs monitoring or encouragement for participation or interaction.  Problem Solving Problem Solving: 5-Solves basic 90% of the time/requires cueing < 10% of the time  Memory Memory: 5-Recognizes or recalls 90% of the time/requires cueing < 10% of the time  2. Anticoagulation/DVT prophylaxis with Pharmaceutical: Lovenox 3. Pain Management: mainly for post op pain see below Medical Problem List and Plan:  1. deconditioning following myocardial infarction. 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 Monitor for deep vein thrombosis  3.. Pain Management: Ultram and tylenol as needed. Monitor with increased mobility  4. Post operative anemia. Patient has been transfused. Followup CBC ok 5. Hypertension. Coreg 6.25 mg twice daily, Lasix 20 mg increase back to bid, Altace 2.5 mg daily but will D/C due to elevated Creatinine. Monitor 6. Hyperlipidemia. Crestor  7. History of bilateral total knee replacements.  8. FEN: nutritional intake poor. Added megace. Ate only 30 percent yesterday. RD f/u 9.  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  Photosensitivity likely med related, D/C benadryl and phenergan 11.  Insomnia trial ambien continue 12.  Pleural effusion s/p thoracentesis Recheck BMET and CXR LOS (Days) 13 A FACE TO FACE EVALUATION WAS PERFORMED  Frazier Balfour E 07/31/2011, 8:43 AM

## 2011-07-31 NOTE — Progress Notes (Signed)
PA reports labs and xray OK and pt cleared for discharge today. Family in agreement with plan.

## 2011-08-03 NOTE — Progress Notes (Deleted)
Patient ID: [name], female   DOB: Jan 09, 1925, 78 y.o.   MRN: [id number] See query note as progress note [name]

## 2011-08-03 NOTE — Progress Notes (Signed)
Patient ID: [name], female   DOB: [birth year], 78 y.o.   MRN: [id number] Coding Query:  Patient admitted with subacute anterior MI comolicated by VSD In cath lab EF 35% and patient in cardiogenic shock based on [facility] numbers Also had VSD with 4:1 shunt  Shock and CHF (acute systolic) required IABP  Patient taken to emergency CABG with VSD repair by Dr Tyrone Sage.  Echo confirmed VSD and EF of 35%  [name] 7:15 PM  08/03/2011

## 2011-08-04 ENCOUNTER — Encounter: Payer: Self-pay | Admitting: *Deleted

## 2011-08-04 NOTE — Progress Notes (Signed)
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

## 2011-08-07 ENCOUNTER — Ambulatory Visit
Admit: 2011-08-07 | Discharge: 2011-08-07 | Disposition: A | Discharge status: Home / Self Care | Payer: Medicare Other | Attending: Radiation Oncology | Admitting: Radiation Oncology

## 2011-08-07 VITALS — BP 99/54 | HR 84 | Temp 97.6°F | Resp 18 | Ht 61.0 in | Wt 137.7 lb

## 2011-08-07 DIAGNOSIS — C381 Malignant neoplasm of anterior mediastinum: Secondary | ICD-10-CM | POA: Insufficient documentation

## 2011-08-07 DIAGNOSIS — Z510 Encounter for antineoplastic radiation therapy: Secondary | ICD-10-CM | POA: Insufficient documentation

## 2011-08-07 DIAGNOSIS — C37 Malignant neoplasm of thymus: Secondary | ICD-10-CM | POA: Insufficient documentation

## 2011-08-07 NOTE — Progress Notes (Signed)
HERE TODAY FOR CONSULT OF THYMOMA MASS FOUND AFTER PT HAD ACUTE MI AND SURGERY WAS BEING PERFORMED FOR THE MI.  THE MASS WAS REMOVED AND PT TOLERATED THE PROCEDURE WELL.  FEET EDEMATOUS BILATERALLY WITH VERY LITTLE PITTING EDEMA.  PRACTICING HER BREATHING BY PURSING LIPS TO ASSIST WITH BREATHING.

## 2011-08-07 NOTE — Progress Notes (Signed)
Please see the Nurse Progress Note in the MD Initial Consult Encounter for this patient. 

## 2011-08-08 NOTE — Progress Notes (Signed)
CC:   Misty Plane, MD  REFERRING PHYSICIAN:  [name], MD  DIAGNOSIS:  Thymoma.  HISTORY OF PRESENT ILLNESS:  The patient is an 78 year old female who had a recent history of some fatigue, exertional dyspnea, and sudden- onset chest pain with nausea and vomiting.  The patient had an EKG performed, which showed an acute anterior myocardial infarction. Cardiac catheterization was performed, and this demonstrated total occlusion of the left anterior descending coronary artery, among other changes.  An anterior septal and ventricular septal defect was also seen and the patient proceeded with an emergency surgery corresponding to a coronary artery bypass grafting x2.  A median sternotomy was performed and an approximate 4 cm firm mass was seen in the anterior mediastinum during this procedure.  This mass invaded the right pleura and into the lung and was invading the pericardium but had not grown into the aorta. This mass was excised and submitted for pathology.  Final pathology returned positive for a thymoma measuring 3.9 cm.  The surgical margin was positive for thymoma.  One lymph node was negative for tumor.  This corresponded to at least a stage II tumor.  The patient's case was discussed at multidisciplinary thoracic conference and it was felt that the patient is at substantial risk for local failure.  Therefore, I have been consulted to evaluate the patient today for possible postoperative radiation given these findings.  PAST MEDICAL HISTORY:  Acute MI and coronary artery disease as noted above in addition to a ventricular septal defect, hypertension, osteoarthritis, status post joint replacement, cataract extractions bilaterally.  CURRENT MEDICATIONS:  Tylenol, Coreg, Ensure, Lasix, Protonix, potassium, Altace, Crestor.  ALLERGIES:  No known drug allergies.  FAMILY HISTORY:  The patient denies any known family history of malignancy.  SOCIAL HISTORY:  The  patient denies any alcohol use.  She has no history of smoking.  The patient lives in [place].  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  REVIEW OF SYSTEMS:  Negative other than as above and documented in the medical chart.  PHYSICAL EXAMINATION:  Vital signs:  Weight 137 pounds, blood pressure 99/54 pulse 84, respiratory rate 20, temperature 97.6, O2 saturation 100%.  General:  Well-developed, elderly female in no acute distress. Alert and oriented x3.  HEENT:  Normocephalic, atraumatic.  Extraocular movements intact.  Oral cavity clear.  Neck:  Supple without any lymphadenopathy.  Cardiovascular:  Regular rate and rhythm. Respiratory:  Clear to auscultation.  GI:  Abdomen is soft, nontender, normal bowel sounds.  Extremities:  Trace edema present bilaterally in the lower extremities.  Neurologic:  No focal deficits.  IMPRESSION AND PLAN:  [name] is a pleasant 78 year old female who has a recent diagnosis of at least stage II thymoma.  This was present with positive margins, and I do believe that the patient would benefit from postoperative radiation for approximately 6 weeks.  The standard treatment for this with a suspicion of possible positive margin or gross disease left behind would be 54 to 60 Gy.  I did discuss this recommendation with the patient and her family, and all of their questions were answered.  The patient is continuing to complain of some significant fatigue, and her main complaint at this time is proceeding too soon with radiation treatment.  Given the fairly indolent nature typically of such tumors, I would favor not rushing into this either, especially given their concerns and the fact that she does seem quite fatigued today.  However, I do believe that she is doing well postoperatively and I would anticipate hopefully being able to begin her radiation treatment within the next several weeks.  I did  discuss with the patient proceeding with simulation later this week.  However, in further reviewing her case, I do believe that it would be helpful to get a good diagnostic CT scan through this area with contrast, and I am therefore going to set this up with stimulation to follow this.  I will need to touch base with the patient's family to let them know that I would like this scan in addition to the simulation which we discussed while they were here.  Hopefully, therefore, we can begin her treatment planning within the next week or so, and we will proceed with her treatment with the timing to be determined at how she continues to recover.  The benefit, risks and side effects of treatment were discussed today and all of their questions were answered.    ______________________________ Radene Gunning, M.D., Ph.D. JSM/MEDQ  D:  08/07/2011  T:  08/08/2011  Job:  [id number]

## 2011-08-11 ENCOUNTER — Ambulatory Visit (HOSPITAL_COMMUNITY)
Admission: RE | Admit: 2011-08-11 | Discharge: 2011-08-11 | Disposition: A | Discharge status: Home / Self Care | Payer: Medicare Other | Source: Ambulatory Visit | Attending: Radiation Oncology | Admitting: Radiation Oncology

## 2011-08-11 ENCOUNTER — Telehealth: Payer: Self-pay | Admitting: Physical Medicine & Rehabilitation

## 2011-08-11 ENCOUNTER — Other Ambulatory Visit: Payer: Self-pay | Admitting: Radiation Oncology

## 2011-08-11 ENCOUNTER — Ambulatory Visit
Admission: RE | Admit: 2011-08-11 | Discharge: 2011-08-11 | Disposition: A | Discharge status: Home / Self Care | Payer: Medicare Other | Source: Ambulatory Visit | Attending: Radiation Oncology | Admitting: Radiation Oncology

## 2011-08-11 DIAGNOSIS — C381 Malignant neoplasm of anterior mediastinum: Secondary | ICD-10-CM

## 2011-08-11 DIAGNOSIS — J90 Pleural effusion, not elsewhere classified: Secondary | ICD-10-CM | POA: Insufficient documentation

## 2011-08-11 DIAGNOSIS — D150 Benign neoplasm of thymus: Secondary | ICD-10-CM | POA: Insufficient documentation

## 2011-08-11 LAB — BUN & CREATININE (CHCC): Creat: 0.8 mg/dl (ref 0.6–1.2)

## 2011-08-11 MED ORDER — IOHEXOL 300 MG/ML  SOLN
80.0000 mL | Freq: Once | INTRAMUSCULAR | Status: AC | PRN
  Administered 2011-08-11: 80 mL via INTRAVENOUS

## 2011-08-11 NOTE — Telephone Encounter (Signed)
[name] would like to extend nursing orders.

## 2011-08-11 NOTE — Telephone Encounter (Signed)
Verbal order was given to [facility] at [facility].

## 2011-08-16 ENCOUNTER — Ambulatory Visit: Payer: Medicare Other

## 2011-08-16 ENCOUNTER — Ambulatory Visit: Payer: Medicare Other | Admitting: Radiation Oncology

## 2011-08-16 ENCOUNTER — Encounter: Payer: Self-pay | Admitting: Cardiovascular Disease

## 2011-08-17 ENCOUNTER — Ambulatory Visit (INDEPENDENT_AMBULATORY_CARE_PROVIDER_SITE_OTHER): Payer: Medicare Other | Admitting: Internal Medicine

## 2011-08-17 ENCOUNTER — Encounter: Payer: Self-pay | Admitting: Internal Medicine

## 2011-08-17 ENCOUNTER — Other Ambulatory Visit (INDEPENDENT_AMBULATORY_CARE_PROVIDER_SITE_OTHER): Payer: Medicare Other

## 2011-08-17 VITALS — BP 118/68 | HR 77 | Temp 97.8°F | Ht 61.0 in | Wt 143.2 lb

## 2011-08-17 DIAGNOSIS — R0600 Dyspnea, unspecified: Secondary | ICD-10-CM

## 2011-08-17 DIAGNOSIS — I10 Essential (primary) hypertension: Secondary | ICD-10-CM

## 2011-08-17 DIAGNOSIS — L0291 Cutaneous abscess, unspecified: Secondary | ICD-10-CM

## 2011-08-17 DIAGNOSIS — I509 Heart failure, unspecified: Secondary | ICD-10-CM

## 2011-08-17 DIAGNOSIS — J90 Pleural effusion, not elsewhere classified: Secondary | ICD-10-CM

## 2011-08-17 DIAGNOSIS — R0609 Other forms of dyspnea: Secondary | ICD-10-CM

## 2011-08-17 DIAGNOSIS — L0390 Cellulitis, unspecified: Secondary | ICD-10-CM

## 2011-08-17 DIAGNOSIS — R0989 Other specified symptoms and signs involving the circulatory and respiratory systems: Secondary | ICD-10-CM

## 2011-08-17 LAB — CBC WITH DIFFERENTIAL/PLATELET
Basophils Absolute: 0 10*3/uL (ref 0.0–0.1)
Eosinophils Absolute: 0.8 10*3/uL — ABNORMAL HIGH (ref 0.0–0.7)
MCHC: 31.7 g/dL (ref 30.0–36.0)
MCV: 83.2 fl (ref 78.0–100.0)
Monocytes Absolute: 0.8 10*3/uL (ref 0.1–1.0)
Neutrophils Relative %: 73.8 % (ref 43.0–77.0)
Platelets: 366 10*3/uL (ref 150.0–400.0)

## 2011-08-17 LAB — BASIC METABOLIC PANEL
BUN: 24 mg/dL — ABNORMAL HIGH (ref 6–23)
CO2: 21 mEq/L (ref 19–32)
Chloride: 105 mEq/L (ref 96–112)
Creatinine, Ser: 1.2 mg/dL (ref 0.4–1.2)

## 2011-08-17 LAB — BRAIN NATRIURETIC PEPTIDE: Pro B Natriuretic peptide (BNP): 838 pg/mL — ABNORMAL HIGH (ref 0.0–100.0)

## 2011-08-17 LAB — TSH: TSH: 9.67 u[IU]/mL — ABNORMAL HIGH (ref 0.35–5.50)

## 2011-08-17 MED ORDER — OLMESARTAN MEDOXOMIL 40 MG PO TABS: ORAL_TABLET | ORAL | Status: DC

## 2011-08-17 NOTE — Progress Notes (Signed)
Subjective:    Patient ID: [name], female    DOB: [birth year]   MRN: [id number]  HPI  58 yowf never smoker never respiratory problems (although slept in recliner since 2000)  until Feb 1st acutely ill with NVD > eval Dr Kim> admitted [facility] with breathing problems during admit and worse since assoc with new leg swelling and rash L leg  DATE OF ADMISSION: 07/18/2011  DATE OF DISCHARGE: 02/252013  DISCHARGE SUMMARY  DISCHARGE DIAGNOSES:    Deconditioning following myocardial infarction. 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   Postoperative anemia.   Hypertension.   Hyperlipidemia.   History of bilateral total knee replacements.  The patient was scheduled for discharge home on  July 28, 2011, however on the morning of July 28, 2011, followup  routine chemistry showed a mildly elevated creatinine of 1.80. Her ACE  inhibitor was held. Her Lasix had been decreased to 40 mg daily. She  was placed on low-dose intravenous fluids, creatinine improved to 1.54,  however, her chest x-ray was also completed showing a right pleural  effusion, underwent thoracentesis by Interventional Radiology with 800  mL of bloody fluid removed. Cardiology Services followed up, advised in  light of her cardiac condition, an ACE inhibitor should be ongoing,  resumed Altace of 5 mg daily. Her creatinine function remained stable  and improved to 1.29 by 2.25. Followup chest x-ray revealed decreased,  but small persistent moderate-sized bilateral pleural effusion. Her  oxygen saturations were greater than 90% on room air     08/17/2011 1st pulmonary eval / [name] cc worse sob x across the room  Since discharge assoc with sensation of nasal and throat and chest congestion but no excess mucus production. New problem since discharge is rash L leg ? Cellulitis to level of distal thigh already on abx but no  improvement in swelling.  No sign excess sputum production.    Sleeping ok in recliner without nocturnal  or early am exacerbation  of respiratory  c/o's or need for noct saba. Also denies any obvious fluctuation of symptoms with weather or environmental changes or other aggravating or alleviating factors except as outlined above    Review of Systems  Constitutional: Negative for fever, chills and unexpected weight change.  HENT: Positive for congestion. Negative for ear pain, nosebleeds, sore throat, rhinorrhea, sneezing, trouble swallowing, dental problem, voice change, postnasal drip and sinus pressure.   Eyes: Negative for visual disturbance.  Respiratory: Positive for shortness of breath. Negative for cough and choking.   Cardiovascular: Positive for leg swelling. Negative for chest pain.  Gastrointestinal: Negative for vomiting, abdominal pain and diarrhea.  Genitourinary: Negative for difficulty urinating.  Musculoskeletal: Positive for arthralgias.  Skin: Positive for rash.  Neurological: Negative for tremors, syncope and headaches.  Hematological: Does not bruise/bleed easily.       Objective:   Physical Exam  Frail hoarse w/c bound wf nad at rest with mild pseudowheeze Wt Readings from Last 3 Encounters:  08/17/11 143 lb 3.2 oz (64.955 kg)  08/07/11 137 lb 11.2 oz (62.46 kg)  07/26/11 142 lb 6.7 oz (64.6 kg)    HEENT mild turbinate edema.  Oropharynx no thrush or excess pnd or cobblestoning.  No JVD or cervical adenopathy. Mild accessory muscle hypertrophy. Trachea midline, nl thryroid. Chest was not hyperinflated by percussion with diminished breath sounds and no  increased exp time without true wheeze. Hoover sign  neg on inspiration. Regular rate and rhythm without murmur gallop or rub or increase P2 or edema.  Abd: no hsm, nl excursion. Ext warm with classic cellulitic woody induration of L leg to just above the knee, mod erthema, pitting edema= bilaterally    Assessment &  Plan:

## 2011-08-17 NOTE — Patient Instructions (Addendum)
Please remember to go to the lab downstairs for your tests - we will call you with the results when they are available.  Please see patient coordinator before you leave today  to schedule venous dopplers asap  Stop Ramapril and start benicar 40 mg one half each am  Double the dose of lasix take 2 in am and 2 in pm until swelling down and elevate legs higher than your heart  If this improves your breathing let Dr Eden Emms know it and he can take it from there or refer you back here for further eval

## 2011-08-18 ENCOUNTER — Ambulatory Visit
Admission: RE | Admit: 2011-08-18 | Discharge: 2011-08-18 | Disposition: A | Discharge status: Home / Self Care | Payer: Medicare Other | Source: Ambulatory Visit | Attending: Radiation Oncology | Admitting: Radiation Oncology

## 2011-08-18 ENCOUNTER — Encounter: Payer: Self-pay | Admitting: Internal Medicine

## 2011-08-18 ENCOUNTER — Telehealth: Payer: Self-pay | Admitting: Internal Medicine

## 2011-08-18 DIAGNOSIS — I5022 Chronic systolic (congestive) heart failure: Secondary | ICD-10-CM | POA: Insufficient documentation

## 2011-08-18 DIAGNOSIS — I509 Heart failure, unspecified: Secondary | ICD-10-CM | POA: Insufficient documentation

## 2011-08-18 DIAGNOSIS — J90 Pleural effusion, not elsewhere classified: Secondary | ICD-10-CM | POA: Insufficient documentation

## 2011-08-18 DIAGNOSIS — L0390 Cellulitis, unspecified: Secondary | ICD-10-CM | POA: Insufficient documentation

## 2011-08-18 DIAGNOSIS — C381 Malignant neoplasm of anterior mediastinum: Secondary | ICD-10-CM

## 2011-08-18 MED ORDER — LEVOTHYROXINE SODIUM 25 MCG PO TABS: 25.0000 ug | ORAL_TABLET | Freq: Every day | ORAL | Status: DC

## 2011-08-18 NOTE — Assessment & Plan Note (Signed)
Venous dopplers L leg ordered 08/18/2011   Reviewed elevation, continue abx per Dr Selena Batten

## 2011-08-18 NOTE — Telephone Encounter (Signed)
Spoke with the pt and her daughter and have notified of results/recs of labs done. Pt verbalized understanding and states will go ahead and start the levothyroxine today.

## 2011-08-18 NOTE — Progress Notes (Signed)
Quick Note:  Spoke with pt and notified of results per Dr. Wert. Pt verbalized understanding and denied any questions.  ______ 

## 2011-08-18 NOTE — Assessment & Plan Note (Addendum)
Symptoms are markedly disproportionate to objective findings and not clear this is a lung problem but pt does appear to have difficult airway management issues. DDX of  difficult airways managment all start with A and  include Adherence, Ace Inhibitors, Acid Reflux, Active Sinus Disease, Alpha 1 Antitripsin deficiency, Anxiety masquerading as Airways dz,  ABPA,  allergy(esp in young), Aspiration (esp in elderly), Adverse effects of DPI,  Active smokers, plus two Bs  = Bronchiectasis and Beta blocker use..and one C= CHF  ? acei effect > easy enough to try on benicar x 4 weeks to see what difference if any this makes  ? Beta blocker effect > in the absence of true wheezing or need for albuterol it is probably ok to continue coreg  ? Chf> discussed separately but could clearly benefit from further diuresis if renal fxn allows

## 2011-08-18 NOTE — Assessment & Plan Note (Signed)
-   S/p thoracentesis 07/28/11 x 800 cc bloody    - TSH  9.7 08/17/11 > started synthroid 25 mcg per day 08/18/2011   Assoc with pitting edema in legs and therefore likely related to chf but could be a form of dresslers or related to thymoma or hypothyroid as well.  For now rec double the dose of lasix until seen by Nishan in 2 weeks  See instructions for specific recommendations which were reviewed directly with the patient who was given a copy with highlighter outlining the key components.

## 2011-08-18 NOTE — Assessment & Plan Note (Addendum)
ACE inhibitors are problematic in  pts with airway complaints because  even experienced pulmonologists can't always distinguish ace effects from copd/asthma.  By themselves they don't actually cause a problem, much like oxygen can't by itself start a fire, but they certainly serve as a powerful catalyst or enhancer for any "fire"  or inflammatory process in the upper airway, be it caused by an ET  tube or more commonly reflux (especially in the obese or pts with known GERD or who are on biphoshonates).    In the era of ARB near equivalency(probably = in the short run per Hochrein) until we have a better handle on the reversibility of the airway problem, it just makes sense to avoid ACEI  entirely in the short run and then decide later, having established a level of airway control using a reasonable limited regimen, whether to add back ace but even then being very careful to observe the pt for worsening airway control and number of meds used/ needed to control symptoms.    Will let Dr Eden Emms decide whether better on arb vs acei on f/u in cardiology clinic

## 2011-08-18 NOTE — Progress Notes (Signed)
Met with patient and daughter to discuss RO billing.  Patient had no concerns today.

## 2011-08-22 ENCOUNTER — Encounter (INDEPENDENT_AMBULATORY_CARE_PROVIDER_SITE_OTHER): Payer: Medicare Other

## 2011-08-22 DIAGNOSIS — R0600 Dyspnea, unspecified: Secondary | ICD-10-CM

## 2011-08-22 DIAGNOSIS — R609 Edema, unspecified: Secondary | ICD-10-CM

## 2011-08-24 ENCOUNTER — Encounter: Payer: Self-pay | Admitting: Internal Medicine

## 2011-08-24 NOTE — Progress Notes (Signed)
Quick Note:  Spoke with pt and notified of results per Dr. Wert. Pt verbalized understanding and denied any questions.  ______ 

## 2011-08-25 ENCOUNTER — Other Ambulatory Visit: Payer: Self-pay | Admitting: Internal Medicine

## 2011-08-25 MED ORDER — FUROSEMIDE 20 MG PO TABS: 20.0000 mg | ORAL_TABLET | Freq: Two times a day (BID) | ORAL | Status: DC

## 2011-08-25 NOTE — Telephone Encounter (Signed)
[facility] on [address]   Lasix 20 mg  2 qam and 2 q pm  Per last OV she was to increase her lasix until she seen Dr Eden Emms .  Pt has appt on 08/31/11 with dr Melburn Popper  Gave #60 lasix  20 mg no refill

## 2011-08-28 ENCOUNTER — Telehealth: Payer: Self-pay | Admitting: Physical Medicine & Rehabilitation

## 2011-08-28 NOTE — Telephone Encounter (Signed)
Tried calling Maurine Minister but his mailbox is full and I couldn't leave a message.

## 2011-08-28 NOTE — Telephone Encounter (Signed)
Verbal order given to [facility].

## 2011-08-28 NOTE — Telephone Encounter (Signed)
Would like additional 5 weeks of HH PT.

## 2011-08-31 ENCOUNTER — Ambulatory Visit (INDEPENDENT_AMBULATORY_CARE_PROVIDER_SITE_OTHER): Payer: Medicare Other | Admitting: Cardiovascular Disease

## 2011-08-31 ENCOUNTER — Encounter: Payer: Self-pay | Admitting: Cardiovascular Disease

## 2011-08-31 VITALS — BP 100/57 | HR 85 | Ht 61.0 in | Wt 135.4 lb

## 2011-08-31 DIAGNOSIS — I2510 Atherosclerotic heart disease of native coronary artery without angina pectoris: Secondary | ICD-10-CM

## 2011-08-31 DIAGNOSIS — I272 Pulmonary hypertension, unspecified: Secondary | ICD-10-CM

## 2011-08-31 DIAGNOSIS — I2789 Other specified pulmonary heart diseases: Secondary | ICD-10-CM

## 2011-08-31 DIAGNOSIS — Z951 Presence of aortocoronary bypass graft: Secondary | ICD-10-CM

## 2011-08-31 DIAGNOSIS — Q210 Ventricular septal defect: Secondary | ICD-10-CM

## 2011-08-31 NOTE — Patient Instructions (Signed)
Your physician recommends that you schedule a follow-up appointment in: 1 MONTH TO SEE DR [facility]  Your physician has requested that you have an echocardiogram. Echocardiography is a painless test that uses sound waves to create images of your heart. It provides your doctor with information about the size and shape of your heart and how well your heart's chambers and valves are working. This procedure takes approximately one hour. There are no restrictions for this procedure.

## 2011-08-31 NOTE — Assessment & Plan Note (Signed)
She will be starting radiation therapy on April 1.

## 2011-08-31 NOTE — Assessment & Plan Note (Addendum)
[name] is an 78 year old female who was admitted to the hospital on 07/07/2011 with an acute anterior wall myocardial infarction. It was complicated by a ventricular septal defect.  She was seen by Dr. Eden Emms.  She had a prolonged hospitalization including a week or so in rehabilitation. She has overall done fairly well.  She's not having any further episodes of angina. She is quite weak and debilitated. We'll get an echocardiogram primarily to evaluate her persistent congestive heart failure, pulmonary hypertension and residual VSD. She also has a history of pulmonary hypertension which will need continued evaluation. She will return to see Dr. Eden Emms in 1 month.

## 2011-08-31 NOTE — Progress Notes (Signed)
[name] Date of Birth  February 15, 1925 [facility]     [place]  [address]    [address]   [address] [place], [place]  [address]    [place], [place]  [address] [phone]  Fax  [phone]  [phone]  Fax [phone]  Primary cardiology - Nishan  Problem List: 1. Anterior MI - complicated by VSD 2. Malignant Thymoma 3.  Bilateral knee replacement  History of Present Illness:  [name] is an 78 year old female who was admitted to the hospital on July 07, 2011 with an acute anterior wall myocardial infarction. This was complicated by ventricular septal defect. She had urgent surgery by Dr. Tyrone Sage. She made slow and steady progress throughout her hospitalization.  She was found to have a malignant thymoma. She has seen the cancer doctors at [facility] and is scheduled to have radiation therapy starting April 1.  She saw Dr. Eden Emms during her hospitalization and was supposed to see him in followup. She was placed on my schedule by mistake.    She seems to be making slow and steady progress. She is walking with the help of a walker. She's not had any recurrent episodes of chest pain.  She does have some shortness of breath.  She had an echo Cardizem shortly after her emergency surgery. She is on a very small residual VSD.  She had moderate left ventricular dysfunction with an EF of 35%. She was also found to have mild to moderate pulmonary hypertension.  Current Outpatient Prescriptions on File Prior to Visit  Medication Sig Dispense Refill  . aspirin 325 MG tablet Take 325 mg by mouth daily.      . carvedilol (COREG) 6.25 MG tablet Take 1 tablet (6.25 mg total) by mouth 2 (two) times daily with a meal.      . feeding supplement (ENSURE CLINICAL STRENGTH) LIQD Take 237 mLs by mouth daily.      Marland Kitchen levothyroxine (LEVOTHROID) 25 MCG tablet Take 1 tablet (25 mcg total) by mouth daily.  30 tablet  11  . potassium chloride SA (K-DUR,KLOR-CON) 20  MEQ tablet Take 1 tablet (20 mEq total) by mouth daily.      . rosuvastatin (CRESTOR) 5 MG tablet Take 5 mg by mouth daily at 6 PM.      . traMADol (ULTRAM) 50 MG tablet Take 50 mg by mouth daily.       Marland Kitchen DISCONTD: furosemide (LASIX) 20 MG tablet Take 1 tablet (20 mg total) by mouth 2 (two) times daily.  60 tablet  0  . DISCONTD: olmesartan (BENICAR) 40 MG tablet One half each am      . DISCONTD: pantoprazole (PROTONIX) 40 MG tablet Take 1 tablet (40 mg total) by mouth daily at 12 noon.      Marland Kitchen DISCONTD: ramipril (ALTACE) 5 MG capsule Take 1 capsule (5 mg total) by mouth daily.  30 capsule  1    Allergies  Allergen Reactions  . Amoxapine And Related     Hives    Past Medical History  Diagnosis Date  . Acute MI, anterior wall     a. 07/07/2011  . Osteoarthritis of knee     Bilateral.  a. s/p LTKA ~2007;  b. s/p RTKA 10/2010  . CAD (coronary artery disease)     a. 07/07/2011 - Occluded LAD  . VSD (ventricular septal defect)     a.  07/07/2011 - Noted @ time of MI  . Myocardial  infarction 07/07/2011  . HTN (hypertension)   . 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 Joint replacement   . Total knee arthroplasty     a.  Left ~ 2007, Right 10/2010  . Cataract extraction, bilateral   . Coronary artery bypass graft 07/07/2011    Procedure: CORONARY ARTERY BYPASS GRAFTING (CABG);  Surgeon: Kathlee Nations Suann Larry, MD;  Location: [facility] OR;  Service: Open Heart Surgery;  Laterality: N/A;  times two using greater saphenous vein harvested via endovascular vein harvest  . Vsd repair 07/07/2011    Procedure: VENTRICULAR SEPTAL DEFECT (VSD) REPAIR;  Surgeon: Mikey Bussing, MD;  Location: [facility] OR;  Service: Open Heart Surgery;  Laterality: N/A;  with bovine pericardium 4cm x 4cm  . Mass excision 07/07/2011    Procedure: EXCISION MASS;  Surgeon: Kathlee Nations Suann Larry, MD;  Location: [facility] OR;  Service: Open Heart Surgery;  Laterality: N/A;  Excision of mediastinal mass     History  Smoking status  . Never Smoker   Smokeless tobacco  . Never Used    History  Alcohol Use No    Family History  Problem Relation Age of Onset  . Heart disease Mother     Reviw of Systems:  Reviewed in the HPI.  All other systems are negative.  Physical Exam: Blood pressure 100/57, pulse 85, height 5\' 1"  (1.549 m), weight 135 lb 6.4 oz (61.417 kg). General: Well developed, well nourished, in no acute distress.  Head: Normocephalic, atraumatic, sclera non-icteric, mucus membranes are moist,   Neck: Supple. Carotids are 2 + without bruits. No JVD  Lungs: Clear bilaterally to auscultation.  Heart: regular rate.  normal  S1 S2. She has a high-pitched 3/6 systolic murmur at the left border. It does not radiate to the upper right sternal border or to the axilla.  Abdomen: Soft, non-tender, non-distended with normal bowel sounds. No hepatomegaly. No rebound/guarding. No masses.  Msk:  Strength and tone are normal  Extremities: No clubbing or cyanosis. No edema.  Distal pedal pulses are 2+ and equal bilaterally.  Neuro: Alert and oriented X 3. Moves all extremities spontaneously.  Psych:  Responds to questions appropriately with a normal affect.  ECG:  Assessment / Plan:

## 2011-09-04 ENCOUNTER — Telehealth: Payer: Self-pay | Admitting: *Deleted

## 2011-09-04 ENCOUNTER — Ambulatory Visit
Admission: RE | Admit: 2011-09-04 | Discharge: 2011-09-04 | Disposition: A | Discharge status: Home / Self Care | Payer: Medicare Other | Source: Ambulatory Visit | Attending: Radiation Oncology | Admitting: Radiation Oncology

## 2011-09-04 DIAGNOSIS — C381 Malignant neoplasm of anterior mediastinum: Secondary | ICD-10-CM

## 2011-09-04 NOTE — Telephone Encounter (Signed)
Fax refill request for tramadol 50 mg #60 1 q 6 hrs prn pain. Last fill 07/31/11. No hospital followup app't yet, or scheduled.

## 2011-09-04 NOTE — Telephone Encounter (Signed)
Referred to primary M.D. Would need a office appointment prior to filling any other prescriptions

## 2011-09-04 NOTE — Progress Notes (Signed)
TEACHING DONE WITH PATIENT AND DAUGHTER, BOTH VERBALIZE UNDERSTANDING.  BOOKLET, RADIATION AND YOU GIVEN TO PT WITH PERTINENT INFO MARKED.  RADIAPLEX GIVEN WITH INSTRUCTIONS FOR USE.

## 2011-09-05 ENCOUNTER — Ambulatory Visit
Admission: RE | Admit: 2011-09-05 | Discharge: 2011-09-05 | Disposition: A | Discharge status: Home / Self Care | Payer: Medicare Other | Source: Ambulatory Visit | Attending: Radiation Oncology | Admitting: Radiation Oncology

## 2011-09-05 VITALS — Wt 133.7 lb

## 2011-09-05 DIAGNOSIS — C381 Malignant neoplasm of anterior mediastinum: Secondary | ICD-10-CM

## 2011-09-05 MED ORDER — RADIAPLEXRX EX GEL
Freq: Once | CUTANEOUS | Status: AC
  Administered 2011-09-05: 18:00:00 via TOPICAL

## 2011-09-05 MED ORDER — RADIAPLEXRX EX GEL: Freq: Once | CUTANEOUS | Status: DC

## 2011-09-05 NOTE — Progress Notes (Signed)
Weekly Management Note Current Dose: 3.6  Gy  Projected Dose: 54 Gy   Narrative:  The patient presents for routine under treatment assessment.  CBCT/MVCT images/Port film x-rays were reviewed.  The chart was checked.  Some decreased appetite. Cough controlled with cough drops.   Physical Findings: Alert, but tired.   Impression:  The patient is tolerating radiation.  Plan:  Continue treatment as planned.

## 2011-09-05 NOTE — Progress Notes (Signed)
HERE FOR PUT TODAY.  SKIN LOOKS GOOD, USING RADIAPLEX.  DOES HAVE UNPRODUCTIVE COUGH, SAYS SHE IS USING COUGH DROPS.  APPETITE HAS DECREASED, HAS HAD WT LOSS OF 2 LBS.

## 2011-09-05 NOTE — Telephone Encounter (Signed)
Returned fax to pharmacy with Dr Jodean Lima instructions

## 2011-09-06 ENCOUNTER — Other Ambulatory Visit: Payer: Self-pay | Admitting: Internal Medicine

## 2011-09-06 ENCOUNTER — Ambulatory Visit
Admission: RE | Admit: 2011-09-06 | Discharge: 2011-09-06 | Disposition: A | Discharge status: Home / Self Care | Payer: Medicare Other | Source: Ambulatory Visit | Attending: Radiation Oncology | Admitting: Radiation Oncology

## 2011-09-06 DIAGNOSIS — C381 Malignant neoplasm of anterior mediastinum: Secondary | ICD-10-CM

## 2011-09-06 NOTE — Progress Notes (Signed)
CC:   Radene Gunning, M.D., Ph.D.  DIAGNOSIS:  Thymoma.  NARRATIVE:  Earlier today, Mrs. Selph underwent additional planning for radiation therapy directed at the anterior mediastinal area.  In reviewing the patient's MV CT scans, the patient was noted to have significant improvement in her pleural effusions which was effecting reproducibility of the patient's setup as compared to her planning CT scan.  In light of this, the patient was taken back to the CT simulator today.  The patient was noted to have significant improvement in her pleural effusions.  The patient's prior CTV and PTV volumes were transferred to the patient's new planning CT scan.  A repeat tumor plan will be performed with assessment of PTV coverage as well as V5 and V20 lung DVH assessment.  The patient will continue with originally prescribed dose of radiation.  The patient will continue with her current plan for treatments on April 4th and April 5th.  The patient will be able to begin treatments with her new plan on April 8th.    ______________________________ Billie Lade, Ph.D., M.D. JDK/MEDQ  D:  09/06/2011  T:  09/06/2011  Job:  [id number]

## 2011-09-07 ENCOUNTER — Ambulatory Visit
Admission: RE | Admit: 2011-09-07 | Discharge: 2011-09-07 | Disposition: A | Discharge status: Home / Self Care | Payer: Medicare Other | Source: Ambulatory Visit | Attending: Radiation Oncology | Admitting: Radiation Oncology

## 2011-09-08 ENCOUNTER — Telehealth: Payer: Self-pay | Admitting: Internal Medicine

## 2011-09-08 ENCOUNTER — Ambulatory Visit
Admission: RE | Admit: 2011-09-08 | Discharge: 2011-09-08 | Disposition: A | Discharge status: Home / Self Care | Payer: Medicare Other | Source: Ambulatory Visit | Attending: Radiation Oncology | Admitting: Radiation Oncology

## 2011-09-08 NOTE — Telephone Encounter (Signed)
Called and spoke with karen--she stated that the pts daughter was there to pick up the lasix and insurance would not let them fill it since the dosage was changed on the lasix and it stated that it is too early.  Daughter was very verbally abusive to the staff at pharmacy.  Per TP---ok to change the dosage of the lasix to read 1-2 bid as needed for swelling  #120 with no refills.  This was read back by karen and she will get this filled for the pt.

## 2011-09-11 ENCOUNTER — Ambulatory Visit
Admission: RE | Admit: 2011-09-11 | Discharge: 2011-09-11 | Disposition: A | Discharge status: Home / Self Care | Payer: Medicare Other | Source: Ambulatory Visit | Attending: Radiation Oncology | Admitting: Radiation Oncology

## 2011-09-12 ENCOUNTER — Ambulatory Visit
Admission: RE | Admit: 2011-09-12 | Discharge: 2011-09-12 | Disposition: A | Discharge status: Home / Self Care | Payer: Medicare Other | Source: Ambulatory Visit | Attending: Radiation Oncology | Admitting: Radiation Oncology

## 2011-09-13 ENCOUNTER — Encounter: Payer: Self-pay | Admitting: *Deleted

## 2011-09-13 ENCOUNTER — Ambulatory Visit
Admission: RE | Admit: 2011-09-13 | Discharge: 2011-09-13 | Disposition: A | Discharge status: Home / Self Care | Payer: Medicare Other | Source: Ambulatory Visit | Attending: Radiation Oncology | Admitting: Radiation Oncology

## 2011-09-13 ENCOUNTER — Ambulatory Visit: Payer: Medicare Other | Admitting: Cardiovascular Disease

## 2011-09-13 NOTE — Progress Notes (Signed)
[facility] Psychosocial Distress Screening Clinical Social Work  Clinical Social Work was referred by distress screening protocol.  The patient scored a 6 on the Psychosocial Distress Thermometer which indicates moderate distress. Clinical Social Worker contacted patient to assess for distress and other psychosocial needs. The patient states she feels she is doing well and plans to see Dr. Mitzi Hansen again on Friday. She lives with her spouse who is 84 years old and has Parkinsons.  She reports they have a sitter that stays with them at all times.  She reports no needs at this time. CSW explained CSW role at [facility] and patient plans to call should any needs arise.   Clinical Social Worker follow up needed: no  If yes, follow up plan:  Kathrin Penner, MSW, [facility] Clinical Social Worker [facility] [phone]

## 2011-09-14 ENCOUNTER — Ambulatory Visit
Admission: RE | Admit: 2011-09-14 | Discharge: 2011-09-14 | Disposition: A | Discharge status: Home / Self Care | Payer: Medicare Other | Source: Ambulatory Visit | Attending: Radiation Oncology | Admitting: Radiation Oncology

## 2011-09-15 ENCOUNTER — Ambulatory Visit
Admission: RE | Admit: 2011-09-15 | Discharge: 2011-09-15 | Disposition: A | Discharge status: Home / Self Care | Payer: Medicare Other | Source: Ambulatory Visit | Attending: Radiation Oncology | Admitting: Radiation Oncology

## 2011-09-15 ENCOUNTER — Encounter: Payer: Self-pay | Admitting: Radiation Oncology

## 2011-09-15 VITALS — BP 104/68 | HR 84 | Temp 98.2°F | Resp 20 | Wt 135.9 lb

## 2011-09-15 DIAGNOSIS — C381 Malignant neoplasm of anterior mediastinum: Secondary | ICD-10-CM

## 2011-09-15 NOTE — Progress Notes (Signed)
Pt arrived with walker, slow steady gait, ,alert,oriented x 3, no coughing, no c/o pain, no nausea, tolerating treatments well, 10/30 completed 2:17 PM

## 2011-09-15 NOTE — Progress Notes (Signed)
   Department of Radiation Oncology  Phone:  [phone] Fax:        [phone]  Weekly Treatment Note    Name: [name] Date: 09/15/2011 MRN: [id number] DOB: September 21, 1924   Current dose: 18 gray  Current fraction: 10   MEDICATIONS: Current Outpatient Prescriptions  Medication Sig Dispense Refill  . acetaminophen (TYLENOL) 325 MG tablet Take 650 mg by mouth daily.      Marland Kitchen aspirin 325 MG tablet Take 325 mg by mouth daily.      . carvedilol (COREG) 6.25 MG tablet Take 1 tablet (6.25 mg total) by mouth 2 (two) times daily with a meal.      . feeding supplement (ENSURE CLINICAL STRENGTH) LIQD Take 237 mLs by mouth daily.      . furosemide (LASIX) 20 MG tablet Take 40 mg by mouth 2 (two) times daily. 1 in am and 1 pm now 09/15/11      . levothyroxine (LEVOTHROID) 25 MCG tablet Take 1 tablet (25 mcg total) by mouth daily.  30 tablet  11  . olmesartan (BENICAR) 40 MG tablet Take 20 mg by mouth daily. One half each am      . potassium chloride SA (K-DUR,KLOR-CON) 20 MEQ tablet Take 1 tablet (20 mEq total) by mouth daily.      . rosuvastatin (CRESTOR) 5 MG tablet Take 5 mg by mouth daily at 6 PM.      . traMADol (ULTRAM) 50 MG tablet Take 50 mg by mouth daily.       Marland Kitchen DISCONTD: pantoprazole (PROTONIX) 40 MG tablet Take 1 tablet (40 mg total) by mouth daily at 12 noon.      Marland Kitchen DISCONTD: ramipril (ALTACE) 5 MG capsule Take 1 capsule (5 mg total) by mouth daily.  30 capsule  1     ALLERGIES: Amoxapine and related   LABORATORY DATA:  Lab Results  Component Value Date   WBC 10.5 08/17/2011   HGB 10.9* 08/17/2011   HCT 34.4* 08/17/2011   MCV 83.2 08/17/2011   PLT 366.0 08/17/2011   Lab Results  Component Value Date   NA 136 08/17/2011   K 4.4 08/17/2011   CL 105 08/17/2011   CO2 21 08/17/2011   Lab Results  Component Value Date   ALT 41* 07/19/2011   AST 19 07/19/2011   ALKPHOS 70 07/19/2011   BILITOT 0.9 07/19/2011     NARRATIVE: Tanaisha T Butikofer was seen today for weekly  treatment management. The chart was checked and the patient's films were reviewed. The patient indicates that she is doing well. She feels better overall than she did a week or 2 ago. She is continuing to use a walker but feels like she needs it less. No esophagitis.  PHYSICAL EXAMINATION: weight is 135 lb 14.4 oz (61.644 kg). Her oral temperature is 98.2 F (36.8 C). Her blood pressure is 104/68 and her pulse is 84. Her respiration is 20.        ASSESSMENT: The patient is doing satisfactorily with treatment.  PLAN: We will continue with the patient's radiation treatment as planned.

## 2011-09-18 ENCOUNTER — Ambulatory Visit
Admission: RE | Admit: 2011-09-18 | Discharge: 2011-09-18 | Disposition: A | Discharge status: Home / Self Care | Payer: Medicare Other | Source: Ambulatory Visit | Attending: Radiation Oncology | Admitting: Radiation Oncology

## 2011-09-18 DIAGNOSIS — C381 Malignant neoplasm of anterior mediastinum: Secondary | ICD-10-CM

## 2011-09-19 ENCOUNTER — Telehealth: Payer: Self-pay | Admitting: *Deleted

## 2011-09-19 ENCOUNTER — Other Ambulatory Visit: Payer: Self-pay

## 2011-09-19 ENCOUNTER — Ambulatory Visit
Admission: RE | Admit: 2011-09-19 | Discharge: 2011-09-19 | Disposition: A | Discharge status: Home / Self Care | Payer: Medicare Other | Source: Ambulatory Visit | Attending: Radiation Oncology | Admitting: Radiation Oncology

## 2011-09-19 ENCOUNTER — Ambulatory Visit (HOSPITAL_COMMUNITY): Payer: Medicare Other | Attending: Cardiovascular Disease

## 2011-09-19 DIAGNOSIS — I509 Heart failure, unspecified: Secondary | ICD-10-CM

## 2011-09-19 DIAGNOSIS — I252 Old myocardial infarction: Secondary | ICD-10-CM | POA: Insufficient documentation

## 2011-09-19 DIAGNOSIS — C37 Malignant neoplasm of thymus: Secondary | ICD-10-CM | POA: Insufficient documentation

## 2011-09-19 DIAGNOSIS — I2510 Atherosclerotic heart disease of native coronary artery without angina pectoris: Secondary | ICD-10-CM | POA: Insufficient documentation

## 2011-09-19 DIAGNOSIS — I272 Pulmonary hypertension, unspecified: Secondary | ICD-10-CM

## 2011-09-19 DIAGNOSIS — Q210 Ventricular septal defect: Secondary | ICD-10-CM

## 2011-09-19 DIAGNOSIS — Z79899 Other long term (current) drug therapy: Secondary | ICD-10-CM | POA: Insufficient documentation

## 2011-09-19 DIAGNOSIS — I270 Primary pulmonary hypertension: Secondary | ICD-10-CM | POA: Insufficient documentation

## 2011-09-19 DIAGNOSIS — I10 Essential (primary) hypertension: Secondary | ICD-10-CM | POA: Insufficient documentation

## 2011-09-19 DIAGNOSIS — I059 Rheumatic mitral valve disease, unspecified: Secondary | ICD-10-CM | POA: Insufficient documentation

## 2011-09-19 DIAGNOSIS — Z951 Presence of aortocoronary bypass graft: Secondary | ICD-10-CM

## 2011-09-19 NOTE — Telephone Encounter (Signed)
Pt walked into office wanting clarification on discharge meds--advised to go home and call back with d/c instruction sheet--according to what i have found so far--- pt ,by error, saw dr Elease Hashimoto for f/u post hosp instead of dr Eden Emms or s. Weaver--she was seen by dr Elease Hashimoto, who discontinued the following meds-lasix,benicar,protonix, and altace--she did not bring her d/c summary sheet today, so i have asked her to go home and call me back with list of meds on d/c sheet--she has agreed Pt has returned my call and given names of meds at discharge--asa,coreg,levothyroid,KCL,crestor, ultram,lasix, tylenol,benicar--so, according to my interpretation she should be on the following-- Asa 325mg qd Carvedilol 6.25 1 tab po bid Levothyroxine 1 tab po qd crestor 5mg  1tab po qd trammadol 50mg  1tab po qd KCL 1 tab po qd Colace 200mg  qd Lasix 20mg  bid benicar20mg  qd i will refill the  meds she has been taking, but because this is so mixed up, i will bring her in to see PA or NP--I will share this with dr Fabio Bering nurse so we can clear this up for pt---nt

## 2011-09-19 NOTE — Telephone Encounter (Signed)
Those meds seem appr and BMET ok 3/13

## 2011-09-20 ENCOUNTER — Ambulatory Visit
Admission: RE | Admit: 2011-09-20 | Discharge: 2011-09-20 | Disposition: A | Discharge status: Home / Self Care | Payer: Medicare Other | Source: Ambulatory Visit | Attending: Radiation Oncology | Admitting: Radiation Oncology

## 2011-09-21 ENCOUNTER — Ambulatory Visit
Admission: RE | Admit: 2011-09-21 | Discharge: 2011-09-21 | Disposition: A | Discharge status: Home / Self Care | Payer: Medicare Other | Source: Ambulatory Visit | Attending: Radiation Oncology | Admitting: Radiation Oncology

## 2011-09-22 ENCOUNTER — Encounter: Payer: Self-pay | Admitting: Nurse Practitioner

## 2011-09-22 ENCOUNTER — Ambulatory Visit
Admission: RE | Admit: 2011-09-22 | Discharge: 2011-09-22 | Disposition: A | Discharge status: Home / Self Care | Payer: Medicare Other | Source: Ambulatory Visit | Attending: Radiation Oncology | Admitting: Radiation Oncology

## 2011-09-22 ENCOUNTER — Encounter: Payer: Self-pay | Admitting: Radiation Oncology

## 2011-09-22 ENCOUNTER — Ambulatory Visit (INDEPENDENT_AMBULATORY_CARE_PROVIDER_SITE_OTHER): Payer: Medicare Other | Admitting: Nurse Practitioner

## 2011-09-22 VITALS — BP 102/63 | HR 84 | Ht 61.0 in | Wt 132.0 lb

## 2011-09-22 VITALS — BP 106/57 | HR 86 | Resp 18 | Wt 132.6 lb

## 2011-09-22 DIAGNOSIS — I10 Essential (primary) hypertension: Secondary | ICD-10-CM

## 2011-09-22 DIAGNOSIS — C381 Malignant neoplasm of anterior mediastinum: Secondary | ICD-10-CM

## 2011-09-22 DIAGNOSIS — Q210 Ventricular septal defect: Secondary | ICD-10-CM

## 2011-09-22 DIAGNOSIS — I2510 Atherosclerotic heart disease of native coronary artery without angina pectoris: Secondary | ICD-10-CM

## 2011-09-22 DIAGNOSIS — I255 Ischemic cardiomyopathy: Secondary | ICD-10-CM

## 2011-09-22 DIAGNOSIS — I2589 Other forms of chronic ischemic heart disease: Secondary | ICD-10-CM

## 2011-09-22 LAB — BASIC METABOLIC PANEL
BUN: 24 mg/dL — ABNORMAL HIGH (ref 6–23)
Chloride: 107 mEq/L (ref 96–112)
Glucose, Bld: 90 mg/dL (ref 70–99)
Potassium: 4.7 mEq/L (ref 3.5–5.1)

## 2011-09-22 MED ORDER — OLMESARTAN MEDOXOMIL 40 MG PO TABS: 20.0000 mg | ORAL_TABLET | Freq: Every day | ORAL | Status: DC

## 2011-09-22 MED ORDER — CARVEDILOL 6.25 MG PO TABS: 6.2500 mg | ORAL_TABLET | Freq: Two times a day (BID) | ORAL | Status: AC

## 2011-09-22 MED ORDER — FUROSEMIDE 20 MG PO TABS: 20.0000 mg | ORAL_TABLET | Freq: Two times a day (BID) | ORAL | Status: DC

## 2011-09-22 MED ORDER — SIMVASTATIN 20 MG PO TABS: 20.0000 mg | ORAL_TABLET | Freq: Every evening | ORAL | Status: DC

## 2011-09-22 MED ORDER — POTASSIUM CHLORIDE CRYS ER 20 MEQ PO TBCR: 20.0000 meq | EXTENDED_RELEASE_TABLET | Freq: Every day | ORAL | Status: DC

## 2011-09-22 MED ORDER — ASPIRIN EC 81 MG PO TBEC: 81.0000 mg | DELAYED_RELEASE_TABLET | Freq: Every day | ORAL | Status: AC

## 2011-09-22 NOTE — Progress Notes (Signed)
Patient presents to the clinic today accompanied by her daughter for an under treat visit with Dr. Mitzi Hansen. Patient is alert and oriented to person, place, and time. No distress noted. Steady gait noted. Pleasant affect noted. Patient denies pain at this time. Patient reports a that she "has no appetite." 2.5 lb weight loss noted since last week. Patient denies nausea, vomiting, dizziness, or headache. Patient denies diarrhea or constipation. Patient denies cough. Patient reports SOB with exertion. Patient reports her energy level is normal. Patient reports that today she is going to [place] to see Dr. Sherene Sires to discuss her medication regimen. Patient reports sleeping without difficulty. Reported all findings to Dr. Mitzi Hansen.

## 2011-09-22 NOTE — Progress Notes (Signed)
Patient Name: [name] Date of Encounter: 09/22/2011  Primary Care Provider:  Pearson Grippe, MD, MD Primary Cardiologist:  P. Eden Emms, MD  Patient Profile  78 year old female with a relatively recent history of coronary bypass grafting and VSD repair who presents to discuss her medications.  Problem List   Past Medical History  Diagnosis Date  . Acute MI, anterior wall     a. 07/07/2011  . Osteoarthritis of knee     Bilateral.  a. s/p LTKA ~2007;  b. s/p RTKA 10/2010  . CAD (coronary artery disease)     a. 07/07/2011 anterior MI- Occluded LAD w/ emergent CABG x 2 (SVG->LAD->D1).  . VSD (ventricular septal defect)     a.  07/07/2011 - Noted @ time of MI - s/p repair @ time of CABG;  b. 09/19/2011 Echo: small residula VSD (present on 2/11 echo also)  . HTN (hypertension)   . 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 Radiation initiated 09/2011  . Ischemic cardiomyopathy     a. 07/17/11 Echo: EF 35%;  b. 09/19/2011 Echo: EF 30-35%, Mid-Dist AntSept & Apical AK, mild MR.  Small residual VSD.  Marland Kitchen Hypothyroidism    Past Surgical History  Procedure Date  . Joint replacement   . Total knee arthroplasty     a.  Left ~ 2007, Right 10/2010  . Cataract extraction, bilateral   . Coronary artery bypass graft 07/07/2011    Procedure: CORONARY ARTERY BYPASS GRAFTING (CABG);  Surgeon: Kathlee Nations Suann Larry, MD;  Location: [facility] OR;  Service: Open Heart Surgery;  Laterality: N/A;  times two using greater saphenous vein harvested via endovascular vein harvest  . Vsd repair 07/07/2011    Procedure: VENTRICULAR SEPTAL DEFECT (VSD) REPAIR;  Surgeon: Mikey Bussing, MD;  Location: [facility] OR;  Service: Open Heart Surgery;  Laterality: N/A;  with bovine pericardium 4cm x 4cm  . Mass excision 07/07/2011    Procedure: EXCISION MASS;  Surgeon: Kathlee Nations Suann Larry, MD;  Location: [facility] OR;  Service: Open Heart Surgery;  Laterality: N/A;  Excision of mediastinal mass    Allergies  Allergies    Allergen Reactions  . Amoxapine And Related     Hives    HPI  78 year old female with the above problem list.  She is nearly 3 months status post large anterior myocardial infarction subsequent coronary bypass grafting x2 and VSD repair.  She reports feeling relatively well and is in very good spirits.  She denies chest pain or dyspnea.  The reason for her visit today is to go over her medications as she is somewhat unclear as to what exactly she should be taking.  Her questions mainly centered around titration of her Coreg during hospitalization as well as the utility of Lasix and Benicar.  Home Medications  Prior to Admission medications   Medication Sig Start Date End Date Taking? Authorizing Provider  acetaminophen (TYLENOL) 325 MG tablet Take 650 mg by mouth daily. 07/18/11 07/17/12 Yes Wilmon Pali, PA  carvedilol (COREG) 6.25 MG tablet Take 1 tablet (6.25 mg total) by mouth 2 (two) times daily with a meal. 09/22/11 09/21/12 Yes Ok Anis, NP  feeding supplement (ENSURE CLINICAL STRENGTH) LIQD Take 237 mLs by mouth daily. 07/18/11  Yes Wilmon Pali, PA  furosemide (LASIX) 20 MG tablet Take 1 tablet (20 mg total) by mouth 2 (two) times daily. 09/22/11 09/21/12 Yes Ok Anis, NP  levothyroxine (LEVOTHROID) 25  MCG tablet Take 1 tablet (25 mcg total) by mouth daily. 08/18/11 08/17/12 Yes Nyoka Cowden, MD  olmesartan (BENICAR) 40 MG tablet Take 0.5 tablets (20 mg total) by mouth daily. 1/2 TAB TWICE A DAY 09/22/11  Yes Ok Anis, NP  potassium chloride SA (K-DUR,KLOR-CON) 20 MEQ tablet Take 1 tablet (20 mEq total) by mouth daily. 09/22/11 09/21/12 Yes Ok Anis, NP  traMADol (ULTRAM) 50 MG tablet Take 50 mg by mouth daily.    Yes Historical Provider, MD  aspirin EC 81 MG tablet Take 1 tablet (81 mg total) by mouth daily. 09/22/11 09/21/12  Ok Anis, NP  simvastatin (ZOCOR) 20 MG tablet Take 1 tablet (20 mg total) by mouth every evening. 09/22/11  09/21/12  Ok Anis, NP   Review of Systems No chest pain, sob, n, v, dizziness, syncope, edema, early satiety, dysuria, dark stools, blood in stools, diarrhea, rash/skin changes, fevers, chills, wt loss/gain.  Otherwise all systems reviewed and negative.  Physical Exam  Blood pressure 102/63, pulse 84, height 5\' 1"  (1.549 m), weight 132 lb (59.875 kg).  General: Pleasant, NAD Psych: Normal affect. Neuro: Alert and oriented X 3. Moves all extremities spontaneously. HEENT: Normal  Neck: Supple without bruits or JVD. Lungs:  Resp regular and unlabored, CTA. Heart: RRR no s3, s4.  2/6 holosystolic murmur heard throughout. Abdomen: Soft, non-tender, non-distended, BS + x 4.  Extremities: No clubbing, cyanosis.  Trace bilat LE edema. DP/PT/Radials 1+ and equal bilaterally.  Assessment & Plan  1.  Coronary artery disease:  She is status post CABG x2 and doing quite well.  We reviewed her entire medication list and I recommended that she continue aspirin 81 mg, carvedilol 6.25 mg b.i.d.  She has requested an alternate statin for Crestor and we have prescribed simvastatin 20 mg q.h.s. For the sake of cost.  2.  Ischemic cardiomyopathy:  Lung status looks good.  Continue beta blocker and ARB along with current doses of potassium and Lasix.  She had repeat echocardiogram earlier this week showing moderate LV dysfunction with an EF of 30-35%.  We will check a bmet today as it has been over a month since this was checked.  If possible, she would like to come off potassium.  With persistently low EF we'll need to consider whether or not she would be a candidate for ICD.  3.  Ventricular septal defect: Status post repair.  Both her echo on February 11 and again on April 16 have shown a very small residual VSD.  4.  Mediastinal mass:  Status post resection.  She also completed 3 weeks of radiation and this is followed closely by oncology.  She has noted reduced appetite and oral intake since  initiating radiation and will check a bmet today.  5.  Dispo:  She has f/u scheduled with Dr. Eden Emms on 5/9.   Nicolasa Ducking, NP 09/22/2011, 1:26 PM

## 2011-09-22 NOTE — Patient Instructions (Signed)
Your physician has recommended you make the following change in your medication: Decrease your ASA to 81 mg daily. Start Simvastatin 20 mEq, take 1 tablet once daily.   Refills have been sent into [facility] pharmacy on [address] for 90 day supplies of the following meds: Coreg, Simvastatin, Benicar, Potassium, Furosemide  Your physician recommends that you keep your scheduled appointment with Dr. Eden Emms 10/12/2011.  Your physician recommends that you have blood work done today. BMET

## 2011-09-22 NOTE — Progress Notes (Signed)
   Department of Radiation Oncology  Phone:  [phone] Fax:        [phone]  Weekly Treatment Note    Name: [name] Date: 09/22/2011 MRN: [id number] DOB: [birth year]   Current dose: 27 gray  Current fraction: 15   MEDICATIONS: Current Outpatient Prescriptions  Medication Sig Dispense Refill  . acetaminophen (TYLENOL) 325 MG tablet Take 650 mg by mouth daily.      Marland Kitchen aspirin 325 MG tablet Take 325 mg by mouth daily.      . carvedilol (COREG) 6.25 MG tablet Take 1 tablet (6.25 mg total) by mouth 2 (two) times daily with a meal.      . feeding supplement (ENSURE CLINICAL STRENGTH) LIQD Take 237 mLs by mouth daily.      . furosemide (LASIX) 20 MG tablet Take 40 mg by mouth 2 (two) times daily. 1 in am and 1 pm now 09/15/11      . levothyroxine (LEVOTHROID) 25 MCG tablet Take 1 tablet (25 mcg total) by mouth daily.  30 tablet  11  . olmesartan (BENICAR) 40 MG tablet Take 20 mg by mouth daily. One half each am      . potassium chloride SA (K-DUR,KLOR-CON) 20 MEQ tablet Take 1 tablet (20 mEq total) by mouth daily.      . rosuvastatin (CRESTOR) 5 MG tablet Take 5 mg by mouth daily at 6 PM.      . traMADol (ULTRAM) 50 MG tablet Take 50 mg by mouth daily.       Marland Kitchen DISCONTD: pantoprazole (PROTONIX) 40 MG tablet Take 1 tablet (40 mg total) by mouth daily at 12 noon.      Marland Kitchen DISCONTD: ramipril (ALTACE) 5 MG capsule Take 1 capsule (5 mg total) by mouth daily.  30 capsule  1     ALLERGIES: Amoxapine and related   LABORATORY DATA:  Lab Results  Component Value Date   WBC 10.5 08/17/2011   HGB 10.9* 08/17/2011   HCT 34.4* 08/17/2011   MCV 83.2 08/17/2011   PLT 366.0 08/17/2011   Lab Results  Component Value Date   NA 136 08/17/2011   K 4.4 08/17/2011   CL 105 08/17/2011   CO2 21 08/17/2011   Lab Results  Component Value Date   ALT 41* 07/19/2011   AST 19 07/19/2011   ALKPHOS 70 07/19/2011   BILITOT 0.9 07/19/2011     NARRATIVE: [name] was seen today for weekly  treatment management. The chart was checked and the patient's films were reviewed. The patient describes decreased appetite. She does have some boost at home but has not been drinking this much. She denies any significant nausea or change in shortness of breath.  PHYSICAL EXAMINATION: weight is 132 lb 9.6 oz (60.147 kg). Her blood pressure is 106/57 and her pulse is 86. Her respiration is 18.        ASSESSMENT: The patient is doing satisfactorily with treatment.  PLAN: We will continue with the patient's radiation treatment as planned. Encouraged the patient to take in more by mouth. She feels that she'll be able to do this.

## 2011-09-25 ENCOUNTER — Encounter: Payer: Self-pay | Admitting: *Deleted

## 2011-09-25 ENCOUNTER — Ambulatory Visit
Admission: RE | Admit: 2011-09-25 | Discharge: 2011-09-25 | Disposition: A | Discharge status: Home / Self Care | Payer: Medicare Other | Source: Ambulatory Visit | Attending: Radiation Oncology | Admitting: Radiation Oncology

## 2011-09-25 NOTE — Patient Instructions (Addendum)
Notified pt of  Normal labs per Ward Givens, NP.  Pt understood directions to decrease potassium to 1/2 (10 mg) dose daily.  Vista Mink, CMA

## 2011-09-26 ENCOUNTER — Ambulatory Visit
Admission: RE | Admit: 2011-09-26 | Discharge: 2011-09-26 | Disposition: A | Discharge status: Home / Self Care | Payer: Medicare Other | Source: Ambulatory Visit | Attending: Radiation Oncology | Admitting: Radiation Oncology

## 2011-09-27 ENCOUNTER — Ambulatory Visit
Admission: RE | Admit: 2011-09-27 | Discharge: 2011-09-27 | Disposition: A | Discharge status: Home / Self Care | Payer: Medicare Other | Source: Ambulatory Visit | Attending: Radiation Oncology | Admitting: Radiation Oncology

## 2011-09-28 ENCOUNTER — Ambulatory Visit
Admission: RE | Admit: 2011-09-28 | Discharge: 2011-09-28 | Disposition: A | Discharge status: Home / Self Care | Payer: Medicare Other | Source: Ambulatory Visit | Attending: Radiation Oncology | Admitting: Radiation Oncology

## 2011-09-28 ENCOUNTER — Institutional Professional Consult (permissible substitution): Payer: Medicare Other | Admitting: Internal Medicine

## 2011-09-28 ENCOUNTER — Encounter: Payer: Self-pay | Admitting: Radiation Oncology

## 2011-09-28 VITALS — BP 101/63 | HR 92 | Temp 97.7°F | Resp 20 | Wt 131.9 lb

## 2011-09-28 DIAGNOSIS — C381 Malignant neoplasm of anterior mediastinum: Secondary | ICD-10-CM

## 2011-09-28 NOTE — Progress Notes (Signed)
   Department of Radiation Oncology  Phone:  [phone] Fax:        [phone]  Weekly Treatment Note    Name: [name] Date: 09/28/2011 MRN: [id number] DOB: 1924/07/13   Current dose: 34.2 gray  Current fraction: 19   MEDICATIONS: Current Outpatient Prescriptions  Medication Sig Dispense Refill  . acetaminophen (TYLENOL) 325 MG tablet Take 650 mg by mouth daily.      Marland Kitchen aspirin EC 81 MG tablet Take 1 tablet (81 mg total) by mouth daily.      . carvedilol (COREG) 6.25 MG tablet Take 1 tablet (6.25 mg total) by mouth 2 (two) times daily with a meal.  180 tablet  3  . CRESTOR 5 MG tablet       . feeding supplement (ENSURE CLINICAL STRENGTH) LIQD Take 237 mLs by mouth daily.      . furosemide (LASIX) 20 MG tablet Take 1 tablet (20 mg total) by mouth 2 (two) times daily.  180 tablet  3  . levothyroxine (LEVOTHROID) 25 MCG tablet Take 1 tablet (25 mcg total) by mouth daily.  30 tablet  11  . olmesartan (BENICAR) 40 MG tablet Take 0.5 tablets (20 mg total) by mouth daily. 1/2 TAB TWICE A DAY  90 tablet  3  . potassium chloride SA (K-DUR,KLOR-CON) 20 MEQ tablet Take 1 tablet (20 mEq total) by mouth daily.  90 tablet  3  . simvastatin (ZOCOR) 20 MG tablet Take 1 tablet (20 mg total) by mouth every evening.  90 tablet  3  . traMADol (ULTRAM) 50 MG tablet Take 50 mg by mouth daily.       Marland Kitchen DISCONTD: pantoprazole (PROTONIX) 40 MG tablet Take 1 tablet (40 mg total) by mouth daily at 12 noon.      Marland Kitchen DISCONTD: ramipril (ALTACE) 5 MG capsule Take 1 capsule (5 mg total) by mouth daily.  30 capsule  1     ALLERGIES: Amoxapine and related   LABORATORY DATA:  Lab Results  Component Value Date   WBC 10.5 08/17/2011   HGB 10.9* 08/17/2011   HCT 34.4* 08/17/2011   MCV 83.2 08/17/2011   PLT 366.0 08/17/2011   Lab Results  Component Value Date   NA 138 09/22/2011   K 4.7 09/22/2011   CL 107 09/22/2011   CO2 22 09/22/2011   Lab Results  Component Value Date   ALT 41* 07/19/2011   AST  19 07/19/2011   ALKPHOS 70 07/19/2011   BILITOT 0.9 07/19/2011     NARRATIVE: Analy T Nichols was seen today for weekly treatment management. The chart was checked and the patient's films were reviewed. The patient is complaining of some esophagitis. She meets with the dietitian on 10/03/2011. She denies any cough shortness of breath or other chest discomfort. She is not using any medication for esophagitis at this time.  PHYSICAL EXAMINATION: weight is 131 lb 14.4 oz (59.829 kg). Her oral temperature is 97.7 F (36.5 C). Her blood pressure is 101/63 and her pulse is 92. Her respiration is 20.        ASSESSMENT: The patient is doing satisfactorily with treatment.  PLAN: We will continue with the patient's radiation treatment as planned. I recommended for the patient to begin either Prilosec or Prevacid. We will continue her radiation.

## 2011-09-28 NOTE — Progress Notes (Signed)
Slight esophagitis; appt w/dietician 10/03/11. Denies cough, sob, pain.

## 2011-09-29 ENCOUNTER — Ambulatory Visit
Admission: RE | Admit: 2011-09-29 | Discharge: 2011-09-29 | Disposition: A | Discharge status: Home / Self Care | Payer: Medicare Other | Source: Ambulatory Visit | Attending: Radiation Oncology | Admitting: Radiation Oncology

## 2011-10-02 ENCOUNTER — Ambulatory Visit
Admission: RE | Admit: 2011-10-02 | Discharge: 2011-10-02 | Disposition: A | Discharge status: Home / Self Care | Payer: Medicare Other | Source: Ambulatory Visit | Attending: Radiation Oncology | Admitting: Radiation Oncology

## 2011-10-03 ENCOUNTER — Ambulatory Visit: Payer: Medicare Other | Admitting: Nutrition

## 2011-10-03 ENCOUNTER — Ambulatory Visit
Admission: RE | Admit: 2011-10-03 | Discharge: 2011-10-03 | Disposition: A | Discharge status: Home / Self Care | Payer: Medicare Other | Source: Ambulatory Visit | Attending: Radiation Oncology | Admitting: Radiation Oncology

## 2011-10-03 NOTE — Assessment & Plan Note (Signed)
[name] is an 78 year old female patient of Dr. Joellen Jersey diagnosed with thymoma  MEDICAL HISTORY INCLUDES:  Acute MI, osteoarthritis, CAD, hypertension, hypothyroidism, cardiomyopathy.  MEDICATIONS INCLUDE:  Crestor, Lasix, K-Dur, and Zocor.  LABS:  TSH of 9.67 on March 2014.  HEIGHT:  61 inches. WEIGHT:  131.9 pounds. USUAL BODY WEIGHT:  140 pounds. BMI:  24.93.  PATIENT STATES:  The patient reports some indigestion.  She really denies difficulty swallowing or chewing.  She reports that she has a very poor appetite and has for the past several months.  She does generally eat 3 meals a day and tries to drink 1 Boost Plus a day.  She does indicate that she has a hard time remembering to eat and drink throughout the day.  NUTRITION DIAGNOSIS:  Unintended weight loss related to diagnosis of thymoma as evidenced 6% weight loss in the last 2-3 months.  INTERVENTION:  I educated [name] and her daughter on strategies for increasing oral intake in small amounts throughout the day.  I have given her a list of suggestions of food she could keep on hand that she could easily snack on throughout the day.  We have discussed strategies for her remembering to eat and drink.  I have encouraged increased fluid intake and given her suggestions on how she can achieve this.  I provided her with fact sheets on high-protein, high-calorie foods as well as poor appetite.  I have encouraged her to increase Boost Plus to b.i.d. to 3 times a day as needed.  I have also provided her with samples and coupons for her to take with her today.  MONITORING/EVALUATION (GOALS):  The patient will tolerate increased oral intake to minimize further weight loss.  NEXT VISIT:  Patient will call with questions or concerns.    ______________________________ Zenovia Jarred, RD, LDN Clinical Nutrition Specialist BN/MEDQ  D:  10/03/2011  T:  10/03/2011  Job:  [id number]

## 2011-10-04 ENCOUNTER — Ambulatory Visit
Admission: RE | Admit: 2011-10-04 | Discharge: 2011-10-04 | Disposition: A | Discharge status: Home / Self Care | Payer: Medicare Other | Source: Ambulatory Visit | Attending: Radiation Oncology | Admitting: Radiation Oncology

## 2011-10-05 ENCOUNTER — Encounter: Payer: Self-pay | Admitting: Radiation Oncology

## 2011-10-05 ENCOUNTER — Ambulatory Visit
Admission: RE | Admit: 2011-10-05 | Discharge: 2011-10-05 | Disposition: A | Discharge status: Home / Self Care | Payer: Medicare Other | Source: Ambulatory Visit | Attending: Radiation Oncology | Admitting: Radiation Oncology

## 2011-10-05 VITALS — BP 96/62 | HR 93 | Resp 20 | Wt 130.7 lb

## 2011-10-05 DIAGNOSIS — C381 Malignant neoplasm of anterior mediastinum: Secondary | ICD-10-CM

## 2011-10-05 MED ORDER — SUCRALFATE 1 G PO TABS: 1.0000 g | ORAL_TABLET | Freq: Four times a day (QID) | ORAL | Status: DC

## 2011-10-05 NOTE — Progress Notes (Signed)
   Department of Radiation Oncology  Phone:  [phone] Fax:        [phone]  Weekly Treatment Note    Name: [name] Date: 10/05/2011 MRN: [id number] DOB: [birth year]   Current dose: 43.2 gray  Current fraction: 24   MEDICATIONS: Current Outpatient Prescriptions  Medication Sig Dispense Refill  . acetaminophen (TYLENOL) 325 MG tablet Take 650 mg by mouth daily.      Marland Kitchen aspirin EC 81 MG tablet Take 1 tablet (81 mg total) by mouth daily.      . carvedilol (COREG) 6.25 MG tablet Take 1 tablet (6.25 mg total) by mouth 2 (two) times daily with a meal.  180 tablet  3  . CRESTOR 5 MG tablet       . feeding supplement (ENSURE CLINICAL STRENGTH) LIQD Take 237 mLs by mouth daily.      . furosemide (LASIX) 20 MG tablet Take 1 tablet (20 mg total) by mouth 2 (two) times daily.  180 tablet  3  . levothyroxine (LEVOTHROID) 25 MCG tablet Take 1 tablet (25 mcg total) by mouth daily.  30 tablet  11  . olmesartan (BENICAR) 40 MG tablet Take 0.5 tablets (20 mg total) by mouth daily. 1/2 TAB TWICE A DAY  90 tablet  3  . potassium chloride SA (K-DUR,KLOR-CON) 20 MEQ tablet Take 1 tablet (20 mEq total) by mouth daily.  90 tablet  3  . simvastatin (ZOCOR) 20 MG tablet Take 1 tablet (20 mg total) by mouth every evening.  90 tablet  3  . traMADol (ULTRAM) 50 MG tablet Take 50 mg by mouth daily.       Marland Kitchen DISCONTD: pantoprazole (PROTONIX) 40 MG tablet Take 1 tablet (40 mg total) by mouth daily at 12 noon.      Marland Kitchen DISCONTD: ramipril (ALTACE) 5 MG capsule Take 1 capsule (5 mg total) by mouth daily.  30 capsule  1     ALLERGIES: Amoxapine and related   LABORATORY DATA:  Lab Results  Component Value Date   WBC 10.5 08/17/2011   HGB 10.9* 08/17/2011   HCT 34.4* 08/17/2011   MCV 83.2 08/17/2011   PLT 366.0 08/17/2011   Lab Results  Component Value Date   NA 138 09/22/2011   K 4.7 09/22/2011   CL 107 09/22/2011   CO2 22 09/22/2011   Lab Results  Component Value Date   ALT 41* 07/19/2011   AST  19 07/19/2011   ALKPHOS 70 07/19/2011   BILITOT 0.9 07/19/2011     NARRATIVE: [name] was seen today for weekly treatment management. The chart was checked and the patient's films were reviewed. The patient is doing fairly well. She is complaining of some esophagitis. Her weight is down just a little bit. She is taking an antacid but is not taking Carafate at this time.  PHYSICAL EXAMINATION: weight is 130 lb 11.2 oz (59.285 kg). Her blood pressure is 96/62 and her pulse is 93. Her respiration is 20 and oxygen saturation is 100%.        ASSESSMENT: The patient is doing satisfactorily with treatment.  PLAN: We will continue with the patient's radiation treatment as planned. I will give her a prescription for Carafate.

## 2011-10-05 NOTE — Progress Notes (Signed)
Pt reports increased fatigue, loss of appetite; drinks Boost, Carnation Instant breakfast daily. Seeing dietician. Advised she may want to eat more snacks, try to increase nutritional supplements slowly.  Pt reports some midsternum discomfort w/eating.  Pt's BP low today; she has appt w/Dr Eden Emms, cardiologist next Thurs. Pt to return to nursing tomorrow for BP check. Pt and daughter verbalized understanding.

## 2011-10-06 ENCOUNTER — Encounter: Payer: Self-pay | Admitting: *Deleted

## 2011-10-06 ENCOUNTER — Ambulatory Visit
Admission: RE | Admit: 2011-10-06 | Discharge: 2011-10-06 | Disposition: A | Discharge status: Home / Self Care | Payer: Medicare Other | Source: Ambulatory Visit | Attending: Radiation Oncology | Admitting: Radiation Oncology

## 2011-10-06 NOTE — Progress Notes (Signed)
VS checked per Virgina Organ RN.

## 2011-10-09 ENCOUNTER — Ambulatory Visit
Admission: RE | Admit: 2011-10-09 | Discharge: 2011-10-09 | Disposition: A | Discharge status: Home / Self Care | Payer: Medicare Other | Source: Ambulatory Visit | Attending: Radiation Oncology | Admitting: Radiation Oncology

## 2011-10-10 ENCOUNTER — Ambulatory Visit
Admission: RE | Admit: 2011-10-10 | Discharge: 2011-10-10 | Disposition: A | Discharge status: Home / Self Care | Payer: Medicare Other | Source: Ambulatory Visit | Attending: Radiation Oncology | Admitting: Radiation Oncology

## 2011-10-11 ENCOUNTER — Ambulatory Visit
Admission: RE | Admit: 2011-10-11 | Discharge: 2011-10-11 | Disposition: A | Discharge status: Home / Self Care | Payer: Medicare Other | Source: Ambulatory Visit | Attending: Radiation Oncology | Admitting: Radiation Oncology

## 2011-10-12 ENCOUNTER — Ambulatory Visit (INDEPENDENT_AMBULATORY_CARE_PROVIDER_SITE_OTHER): Payer: Medicare Other | Admitting: Cardiovascular Disease

## 2011-10-12 ENCOUNTER — Encounter: Payer: Self-pay | Admitting: Cardiovascular Disease

## 2011-10-12 ENCOUNTER — Ambulatory Visit
Admission: RE | Admit: 2011-10-12 | Discharge: 2011-10-12 | Disposition: A | Discharge status: Home / Self Care | Payer: Medicare Other | Source: Ambulatory Visit | Attending: Radiation Oncology | Admitting: Radiation Oncology

## 2011-10-12 ENCOUNTER — Encounter: Payer: Self-pay | Admitting: Radiation Oncology

## 2011-10-12 VITALS — BP 94/53 | HR 87 | Resp 18 | Wt 125.4 lb

## 2011-10-12 VITALS — BP 102/56 | HR 98 | Wt 131.0 lb

## 2011-10-12 DIAGNOSIS — C381 Malignant neoplasm of anterior mediastinum: Secondary | ICD-10-CM

## 2011-10-12 DIAGNOSIS — I2510 Atherosclerotic heart disease of native coronary artery without angina pectoris: Secondary | ICD-10-CM

## 2011-10-12 DIAGNOSIS — I2109 ST elevation (STEMI) myocardial infarction involving other coronary artery of anterior wall: Secondary | ICD-10-CM

## 2011-10-12 DIAGNOSIS — I509 Heart failure, unspecified: Secondary | ICD-10-CM

## 2011-10-12 NOTE — Progress Notes (Signed)
Patient presents to the clinic today accompanied by her daughter for an under treat visit with Dr. Mitzi Hansen. Patient is alert and oriented to person, place, and time. No distress noted. Steady gait noted with assistance of cane. Pleasant affect noted. Patient denies pain at this time. Patient reports a decrease in her energy level. Patient seen by Zenovia Jarred, RD the end of last week. Patient reports taking in on average two cans of Ensure per day and tries to get in one carnation breakfast. Patient reports that she has no appetite. Five pound weight loss noted in just seven days. Daughter reports patient on average only eats breakfast and a small meal mid day. Encouraged patient to increase caloric and protein intake. Patient denies having a sore throat. Patient denies nausea, vomiting, headache, diarrhea, or dizziness. Reported all findings to Dr. Mitzi Hansen.

## 2011-10-12 NOTE — Patient Instructions (Addendum)
Your physician recommends that you return for lab work in: 6 weeks- bmp/tsh/t4  Your physician has recommended you make the following change in your medication:  1) Stop potassium.  Your physician recommends that you schedule a follow-up appointment in: 4 months with Dr. Eden Emms.

## 2011-10-12 NOTE — Assessment & Plan Note (Signed)
Stable no angina.  Despite ischemic DCM I dont think we should entertain AICD given advanced age and continued Rx of thymoma

## 2011-10-12 NOTE — Assessment & Plan Note (Signed)
Finish course of XRT and add sucralifate for gastric erosions.  F/U CT per radiation oncology

## 2011-10-12 NOTE — Assessment & Plan Note (Signed)
Small shunt no change in murmur Echo stable since surgery.  F/U in a year

## 2011-10-12 NOTE — Progress Notes (Signed)
Patient ID: [name], female   DOB: [birth year], 78 y.o.   MRN: [id number] [name] 77 yo who had delayed presentation of a large anterior MI on 07/07/11.  Complicated by shock and VSD.  Had CABG with SVG to Diagonal and LAD with VSD patch. Echo 4/16 reviewed and has EF 30-35% with small residual VSD.  No thrombus.  Unfortunatley also got diagnosed with thymoma at time of surgery and has gotten XRT for the last 6 weeks.  Caused erosion of her gi mucosa and is now on sucralafate.  She denies dyspnea, palpitations, chest pain.  She has mild LE edema.  She still cares for her husband with parkinsons at home.  Daughter is very supportive.    ROS: Denies fever, malais, weight loss, blurry vision, decreased visual acuity, cough, sputum, SOB, hemoptysis, pleuritic pain, palpitaitons, heartburn, abdominal pain, melena, lower extremity edema, claudication, or rash.  All other systems reviewed and negative  General: Affect appropriate Healthy:  appears stated age HEENT: normal Neck supple with no adenopathy JVP normal no bruits no thyromegaly Lungs clear with no wheezing and good diaphragmatic motion Heart:  S1/S2 VSDmurmur, no rub, gallop or click PMI normal Abdomen: benighn, BS positve, no tenderness, no AAA no bruit.  No HSM or HJR Distal pulses intact with no bruits Plus one bilateral  edema Neuro non-focal Skin warm and dry No muscular weakness   Current Outpatient Prescriptions  Medication Sig Dispense Refill  . aspirin EC 81 MG tablet Take 1 tablet (81 mg total) by mouth daily.      . carvedilol (COREG) 6.25 MG tablet Take 1 tablet (6.25 mg total) by mouth 2 (two) times daily with a meal.  180 tablet  3  . feeding supplement (ENSURE CLINICAL STRENGTH) LIQD Take 237 mLs by mouth daily.      . furosemide (LASIX) 20 MG tablet Take 1 tablet (20 mg total) by mouth 2 (two) times daily.  180 tablet  3  . levothyroxine (LEVOTHROID) 25 MCG tablet Take 1 tablet (25 mcg total) by mouth daily.  30  tablet  11  . olmesartan (BENICAR) 40 MG tablet Take 0.5 tablets (20 mg total) by mouth daily. 1/2 TAB TWICE A DAY  90 tablet  3  . simvastatin (ZOCOR) 20 MG tablet Take 1 tablet (20 mg total) by mouth every evening.  90 tablet  3  . sucralfate (CARAFATE) 1 G tablet Take 1 tablet (1 g total) by mouth 4 (four) times daily. Dissolve in 15 cc of water  90 tablet  1  . traMADol (ULTRAM) 50 MG tablet Take 50 mg by mouth daily.       Marland Kitchen DISCONTD: pantoprazole (PROTONIX) 40 MG tablet Take 1 tablet (40 mg total) by mouth daily at 12 noon.      Marland Kitchen DISCONTD: ramipril (ALTACE) 5 MG capsule Take 1 capsule (5 mg total) by mouth daily.  30 capsule  1    Allergies  Amoxapine and related  Electrocardiogram:  Assessment and Plan

## 2011-10-12 NOTE — Progress Notes (Signed)
Department of Radiation Oncology  Phone:  [phone] Fax:        [phone]  Weekly Treatment Note    Name: [name] Date: 10/12/2011 MRN: [id number] DOB: July 14, 1924   Current dose: 52.2 gray  Current fraction: 29   MEDICATIONS: Current Outpatient Prescriptions  Medication Sig Dispense Refill  . acetaminophen (TYLENOL) 325 MG tablet Take 650 mg by mouth daily.      Marland Kitchen aspirin EC 81 MG tablet Take 1 tablet (81 mg total) by mouth daily.      . carvedilol (COREG) 6.25 MG tablet Take 1 tablet (6.25 mg total) by mouth 2 (two) times daily with a meal.  180 tablet  3  . CRESTOR 5 MG tablet       . feeding supplement (ENSURE CLINICAL STRENGTH) LIQD Take 237 mLs by mouth daily.      . furosemide (LASIX) 20 MG tablet Take 1 tablet (20 mg total) by mouth 2 (two) times daily.  180 tablet  3  . levothyroxine (LEVOTHROID) 25 MCG tablet Take 1 tablet (25 mcg total) by mouth daily.  30 tablet  11  . olmesartan (BENICAR) 40 MG tablet Take 0.5 tablets (20 mg total) by mouth daily. 1/2 TAB TWICE A DAY  90 tablet  3  . potassium chloride SA (K-DUR,KLOR-CON) 20 MEQ tablet Take 1 tablet (20 mEq total) by mouth daily.  90 tablet  3  . simvastatin (ZOCOR) 20 MG tablet Take 1 tablet (20 mg total) by mouth every evening.  90 tablet  3  . sucralfate (CARAFATE) 1 G tablet Take 1 tablet (1 g total) by mouth 4 (four) times daily. Dissolve in 15 cc of water  90 tablet  1  . traMADol (ULTRAM) 50 MG tablet Take 50 mg by mouth daily.       Marland Kitchen DISCONTD: pantoprazole (PROTONIX) 40 MG tablet Take 1 tablet (40 mg total) by mouth daily at 12 noon.      Marland Kitchen DISCONTD: ramipril (ALTACE) 5 MG capsule Take 1 capsule (5 mg total) by mouth daily.  30 capsule  1     ALLERGIES: Amoxapine and related   LABORATORY DATA:  Lab Results  Component Value Date   WBC 10.5 08/17/2011   HGB 10.9* 08/17/2011   HCT 34.4* 08/17/2011   MCV 83.2 08/17/2011   PLT 366.0 08/17/2011   Lab Results  Component Value Date   NA 138  09/22/2011   K 4.7 09/22/2011   CL 107 09/22/2011   CO2 22 09/22/2011   Lab Results  Component Value Date   ALT 41* 07/19/2011   AST 19 07/19/2011   ALKPHOS 70 07/19/2011   BILITOT 0.9 07/19/2011     NARRATIVE: [name] was seen today for weekly treatment management. The chart was checked and the patient's films were reviewed. The patient is stable. She continues to complain of some fatigue and decreased appetite. The patient has seen the nutritionist and is trying to increase her by mouth intake. She is taking into cancer Ensure per day. The patient has one more treatment.  PHYSICAL EXAMINATION: weight is 125 lb 6.4 oz (56.881 kg). Her blood pressure is 94/53 and her pulse is 87. Her respiration is 18.        ASSESSMENT: The patient is doing satisfactorily with treatment.  PLAN: We will continue with the patient's radiation treatment as planned. Patient has been able to continue her treatment in am pleased that she is going to finish tomorrow. I am  hopeful that her appetite and esophagitis will improve as expected. I will see her back in one month for ongoing followup.

## 2011-10-12 NOTE — Assessment & Plan Note (Signed)
Continue Lasix and on ARB  Stop KCL as the pill is large and very irritation with her XRT.  K was 4.6 last week.  Recheck labs in 6 weeks including TSH/T4

## 2011-10-13 ENCOUNTER — Ambulatory Visit
Admission: RE | Admit: 2011-10-13 | Discharge: 2011-10-13 | Disposition: A | Discharge status: Home / Self Care | Payer: Medicare Other | Source: Ambulatory Visit | Attending: Radiation Oncology | Admitting: Radiation Oncology

## 2011-10-13 DIAGNOSIS — C381 Malignant neoplasm of anterior mediastinum: Secondary | ICD-10-CM

## 2011-11-15 ENCOUNTER — Encounter: Payer: Self-pay | Admitting: Radiation Oncology

## 2011-11-21 ENCOUNTER — Other Ambulatory Visit (INDEPENDENT_AMBULATORY_CARE_PROVIDER_SITE_OTHER): Payer: Medicare Other

## 2011-11-21 DIAGNOSIS — I2109 ST elevation (STEMI) myocardial infarction involving other coronary artery of anterior wall: Secondary | ICD-10-CM

## 2011-11-21 DIAGNOSIS — I2510 Atherosclerotic heart disease of native coronary artery without angina pectoris: Secondary | ICD-10-CM

## 2011-11-21 DIAGNOSIS — I509 Heart failure, unspecified: Secondary | ICD-10-CM

## 2011-11-21 LAB — BASIC METABOLIC PANEL
CO2: 25 mEq/L (ref 19–32)
Calcium: 9.2 mg/dL (ref 8.4–10.5)
Creatinine, Ser: 1 mg/dL (ref 0.4–1.2)
GFR: 56.02 mL/min — ABNORMAL LOW (ref >60.00)
Glucose, Bld: 119 mg/dL — ABNORMAL HIGH (ref 70–99)

## 2011-11-23 ENCOUNTER — Telehealth: Payer: Self-pay | Admitting: Cardiovascular Disease

## 2011-11-23 NOTE — Telephone Encounter (Signed)
PT AWARE OF LAB RESULTS./CY 

## 2011-11-23 NOTE — Telephone Encounter (Signed)
Patient returning nurse call, she can be reached at [phone].   Patient will be going out to the store @ 2pm

## 2011-11-28 NOTE — Progress Notes (Signed)
   Department of Radiation Oncology  Phone:  [phone] Fax:        [phone] ________________________________  Name:[name] MRN: [id number]  Date: 09/18/2011  DOB: [birth year]   IMRT device note  The patient presented for his 11th fraction of radiotherapy today. And IMRT device therefore was constructed for this treatment since a second IMRT plan was required for her treatment given a substantial change in her anatomy in the treatment area. The patient's IMRT plan was reviewed and this was constructed and will deliver 7.4 field widths. This corresponds to his tomotherapy unit treatment. This additional IMRT device therefore will be used for each fraction of radiation and this is satisfactory for [name] to proceed with his treatment.   ________________________________   Radene Gunning, MD, PhD

## 2011-11-28 NOTE — Addendum Note (Signed)
Encounter addended by: Jonna Coup, MD on: 11/28/2011  1:34 PM<BR>     Documentation filed: Visit Diagnoses

## 2011-11-28 NOTE — Progress Notes (Signed)
  Radiation Oncology         [phone] ________________________________  Name: [name] MRN: [id number]  Date: 08/18/2011  DOB: [birth year]  SIMULATION AND TREATMENT PLANNING NOTE  DIAGNOSIS:  Thymoma  NARRATIVE:  The patient was brought to the CT Simulation planning suite.  Identity was confirmed.  All relevant records and images related to the planned course of therapy were reviewed.   Written consent to proceed with treatment was confirmed which was freely given after reviewing the details related to the planned course of therapy had been reviewed with the patient.  Then, the patient was set-up in a stable reproducible  supine position for radiation therapy.  CT images were obtained.  Surface markings were placed.    The CT images were loaded into the planning software.  Then the target and avoidance structures were contoured.  Treatment planning then occurred.  The radiation prescription was entered and confirmed.  The patient will receive an IMRT technique on our tomotherapy unit, and daily image guidance will be used for each fraction. I have requested : Intensity Modulated Radiotherapy (IMRT) is medically necessary for this case for the following reason:  Dose homogeneity, complex shape with sparing of surrounding lungs, spinal cord. A PTV 54 has been delineated as the primary target structure which represents the high risk target region.  PLAN:  The patient will receive 54 Gy in 30 fractions.  ________________________________   Radene Gunning, MD, PhD

## 2011-11-28 NOTE — Progress Notes (Signed)
   Department of Radiation Oncology  Phone:  [phone] Fax:        [phone] ________________________________  Name:[name] MRN: [id number]  Date: 09/04/2011  DOB: 25-Jan-1925   IMRT device note  The patient presented for his first fraction of radiotherapy today. And IMRT device therefore was constructed for this treatment. The patient's IMRT plan was reviewed and this was constructed and will deliver 7.4 field widths. This corresponds to his tomotherapy unit treatment. This IMRT device therefore will be used for each fraction of radiation and this is satisfactory for [name] to proceed with his treatment.   ________________________________   Radene Gunning, MD, PhD

## 2011-11-28 NOTE — Progress Notes (Signed)
  Radiation Oncology         [phone]) [phone] ________________________________  Name: [name] MRN: [id number]  Date: 10/13/2011  DOB: Oct 27, 1924  End of Treatment Note  Diagnosis:   Thymoma     Indication for treatment:  Curative       Radiation treatment dates:   09/04/2011 through 10/13/2011  Site/dose:   The patient was treated to the high-risk region within the mediastinum. She was treated with a IMRT technique on our tomotherapy unit. Daily image guidance was used. Towards the beginning of her treatment she was found to have a significant change in her anatomy with a change in the amount of pleural effusion. This was felt to substantially alter the patient's planned potentially and therefore she required to be read planned with a second IMRT plan to account for this. This second plan was used for the remainder of her treatment. The patient using these combine plans receive 54 gray at 1.8 gray per fraction.  Narrative: The patient tolerated radiation treatment relatively well.   She experienced minimal esophagitis and did not experience any significant change in shortness of breath during her treatment. Her skin did very well.  Plan: The patient has completed radiation treatment. The patient will return to radiation oncology clinic for routine followup in one month. I advised the patient to call or return sooner if they have any questions or concerns related to their recovery or treatment. ________________________________  Radene Gunning, M.D., Ph.D.

## 2011-12-01 ENCOUNTER — Ambulatory Visit
Admission: RE | Admit: 2011-12-01 | Discharge: 2011-12-01 | Disposition: A | Discharge status: Home / Self Care | Payer: Medicare Other | Source: Ambulatory Visit | Attending: Radiation Oncology | Admitting: Radiation Oncology

## 2011-12-01 ENCOUNTER — Encounter: Payer: Self-pay | Admitting: Radiation Oncology

## 2011-12-01 VITALS — BP 105/62 | HR 78 | Temp 97.3°F | Resp 20 | Wt 129.8 lb

## 2011-12-01 DIAGNOSIS — C381 Malignant neoplasm of anterior mediastinum: Secondary | ICD-10-CM

## 2011-12-01 NOTE — Progress Notes (Signed)
Pt denies pain, states her appetite is improving, has gained 4 lbs past 6 wks. Fatigue is at her baseline. She states the redness of skin in her tx area has resolved. She denies cough, SOB.  Pt asking if she still needs to be on thyroid med, was put on Levothroid in March after d/c from hospital. She states she has appt w/Dr Selena Batten, PCP in 2-3 weeks, will discuss w him if not addressed today.  Pt also questioning her need for BP med. Daughter noted BP from today and was advised to discuss w/PCP.

## 2011-12-01 NOTE — Progress Notes (Signed)
  Radiation Oncology         [phone] ________________________________  Name: [name] MRN: [id number]  Date: 12/01/2011  DOB: 23-Jul-1924  Follow-Up Visit Note  CC: Pearson Grippe, MD  [name], MD  Diagnosis:   Thymoma  Interval Since Last Radiation:  6 weeks   Narrative:  The patient returns today for routine follow-up.  The patient finished her course of postoperative radiation on 10/13/2011. She states that she has done well since that time. No ongoing issues in terms of acute toxicity. No esophagitis and no shortness of breath.                              ALLERGIES:  is allergic to amoxapine and related.  Meds: Current Outpatient Prescriptions  Medication Sig Dispense Refill  . aspirin EC 81 MG tablet Take 1 tablet (81 mg total) by mouth daily.      . carvedilol (COREG) 6.25 MG tablet Take 1 tablet (6.25 mg total) by mouth 2 (two) times daily with a meal.  180 tablet  3  . feeding supplement (ENSURE CLINICAL STRENGTH) LIQD Take 237 mLs by mouth daily.      . Ibuprofen-Diphenhydramine HCl (ADVIL PM) 200-25 MG CAPS Take by mouth.      . levothyroxine (LEVOTHROID) 25 MCG tablet Take 1 tablet (25 mcg total) by mouth daily.  30 tablet  11  . olmesartan (BENICAR) 40 MG tablet Take 0.5 tablets (20 mg total) by mouth daily. 1/2 TAB TWICE A DAY  90 tablet  3  . simvastatin (ZOCOR) 20 MG tablet Take 1 tablet (20 mg total) by mouth every evening.  90 tablet  3  . DISCONTD: pantoprazole (PROTONIX) 40 MG tablet Take 1 tablet (40 mg total) by mouth daily at 12 noon.      Marland Kitchen DISCONTD: ramipril (ALTACE) 5 MG capsule Take 1 capsule (5 mg total) by mouth daily.  30 capsule  1    Physical Findings: The patient is in no acute distress. Patient is alert and oriented.  weight is 129 lb 12.8 oz (58.877 kg). Her temperature is 97.3 F (36.3 C). Her blood pressure is 105/62 and her pulse is 78. Her respiration is 20. Marland Kitchen   General: Well-developed, in no acute distress HEENT:  Normocephalic, atraumatic Cardiovascular: Regular rate and rhythm Respiratory: Clear to auscultation bilaterally GI: Soft, nontender, normal bowel sounds Extremities: No edema present  Lab Findings: Lab Results  Component Value Date   WBC 10.5 08/17/2011   HGB 10.9* 08/17/2011   HCT 34.4* 08/17/2011   MCV 83.2 08/17/2011   PLT 366.0 08/17/2011     Radiographic Findings: No results found.  Impression:    The patient has done well since she finished her radiation treatment 6 weeks ago. No ongoing acute issues.  Plan:  She will undergo a repeat CT scan of the chest in 3 months with followup after this.   Radene Gunning, M.D., Ph.D.

## 2011-12-04 ENCOUNTER — Telehealth: Payer: Self-pay | Admitting: *Deleted

## 2011-12-04 NOTE — Telephone Encounter (Signed)
Called patient to inform of test and fu visit, spoke with patient and she is aware of these appts. 

## 2012-02-08 ENCOUNTER — Ambulatory Visit (INDEPENDENT_AMBULATORY_CARE_PROVIDER_SITE_OTHER): Payer: Medicare Other | Admitting: Cardiovascular Disease

## 2012-02-08 ENCOUNTER — Encounter: Payer: Self-pay | Admitting: Cardiovascular Disease

## 2012-02-08 VITALS — BP 122/68 | HR 84 | Ht 61.0 in | Wt 131.2 lb

## 2012-02-08 DIAGNOSIS — I2109 ST elevation (STEMI) myocardial infarction involving other coronary artery of anterior wall: Secondary | ICD-10-CM

## 2012-02-08 DIAGNOSIS — I10 Essential (primary) hypertension: Secondary | ICD-10-CM

## 2012-02-08 DIAGNOSIS — C381 Malignant neoplasm of anterior mediastinum: Secondary | ICD-10-CM

## 2012-02-08 DIAGNOSIS — I509 Heart failure, unspecified: Secondary | ICD-10-CM

## 2012-02-08 DIAGNOSIS — Q210 Ventricular septal defect: Secondary | ICD-10-CM

## 2012-02-08 NOTE — Assessment & Plan Note (Signed)
Has F/U CT scheduled Finished XRT  F/U Dr Mitzi Hansen

## 2012-02-08 NOTE — Assessment & Plan Note (Signed)
Euvolemic No clinical CHF continue ACE and beta blocker

## 2012-02-08 NOTE — Assessment & Plan Note (Signed)
Well controlled.  Continue current medications and low sodium Dash type diet.    

## 2012-02-08 NOTE — Assessment & Plan Note (Signed)
No change in murmur small by echo 4/13  stable

## 2012-02-08 NOTE — Patient Instructions (Signed)
Your physician wants you to follow-up in:  6 MONTHS WITH DR NISHAN  You will receive a reminder letter in the mail two months in advance. If you don't receive a letter, please call our office to schedule the follow-up appointment. Your physician recommends that you continue on your current medications as directed. Please refer to the Current Medication list given to you today. 

## 2012-02-08 NOTE — Progress Notes (Signed)
Patient ID: [name], female   DOB: Jan 07, 1925, 78 y.o.   MRN: [id number] [name] 78 yo who had delayed presentation of a large anterior MI on 07/07/11. Complicated by shock and VSD. Had CABG with SVG to Diagonal and LAD with VSD patch. Echo 4/16 reviewed and has EF 30-35% with small residual VSD. No thrombus. Unfortunatley also got diagnosed with thymoma at time of surgery and has gotten XRT  6 weeks. Caused erosion of her gi mucosa and is now on sucralafate. She denies dyspnea, palpitations, chest pain. She has mild LE edema. She still cares for her husband with parkinsons at home. Daughter is very supportive.   Has F/U CT scheduled for thymoma.    Echo 4/13 reviewed  Study Conclusions  - Left ventricle: The cavity size was normal. Wall thickness was normal. Systolic function was moderately to severely reduced. The estimated ejection fraction was in the range of 30% to 35%. There is akinesis of the mid-distalanteroseptal and apical myocardium. - Mitral valve: Mild regurgitation. - Right ventricle: Systolic function was mildly reduced. - Pericardium, extracardiac: A trivial pericardial effusion was identified. Impressions:  - There appears to be a small VSD in the distal muscular septum.    . ROS: Denies fever, malais, weight loss, blurry vision, decreased visual acuity, cough, sputum, SOB, hemoptysis, pleuritic pain, palpitaitons, heartburn, abdominal pain, melena, lower extremity edema, claudication, or rash.  All other systems reviewed and negative  General: Affect appropriate Healthy:  appears stated age HEENT: normal Neck supple with no adenopathy JVP normal no bruits no thyromegaly Lungs clear with no wheezing and good diaphragmatic motion Heart:  S1/S2 VSD  murmur, no rub, gallop or click PMI normal Abdomen: benighn, BS positve, no tenderness, no AAA no bruit.  No HSM or HJR Distal pulses intact with no bruits No edema Neuro non-focal Skin warm and dry No muscular  weakness   Current Outpatient Prescriptions  Medication Sig Dispense Refill  . aspirin EC 81 MG tablet Take 1 tablet (81 mg total) by mouth daily.      . carvedilol (COREG) 6.25 MG tablet Take 1 tablet (6.25 mg total) by mouth 2 (two) times daily with a meal.  180 tablet  3  . feeding supplement (ENSURE CLINICAL STRENGTH) LIQD Take 237 mLs by mouth daily.      . Ibuprofen-Diphenhydramine HCl (ADVIL PM) 200-25 MG CAPS Take by mouth.      . levothyroxine (LEVOTHROID) 25 MCG tablet Take 1 tablet (25 mcg total) by mouth daily.  30 tablet  11  . olmesartan (BENICAR) 40 MG tablet Take 0.5 tablets (20 mg total) by mouth daily. 1/2 TAB TWICE A DAY  90 tablet  3  . simvastatin (ZOCOR) 20 MG tablet Take 1 tablet (20 mg total) by mouth every evening.  90 tablet  3  . DISCONTD: pantoprazole (PROTONIX) 40 MG tablet Take 1 tablet (40 mg total) by mouth daily at 12 noon.      Marland Kitchen DISCONTD: ramipril (ALTACE) 5 MG capsule Take 1 capsule (5 mg total) by mouth daily.  30 capsule  1    Allergies  Amoxapine and related  Electrocardiogram:  07/08/11  SR rate 24 old anterior MI  Assessment and Plan

## 2012-02-08 NOTE — Assessment & Plan Note (Signed)
Stable with no angina and good activity level.  Continue medical Rx  

## 2012-02-29 ENCOUNTER — Ambulatory Visit
Admission: RE | Admit: 2012-02-29 | Discharge: 2012-02-29 | Disposition: A | Discharge status: Home / Self Care | Payer: Medicare Other | Source: Ambulatory Visit | Attending: Radiation Oncology | Admitting: Radiation Oncology

## 2012-02-29 ENCOUNTER — Ambulatory Visit: Payer: Medicare Other

## 2012-02-29 DIAGNOSIS — C381 Malignant neoplasm of anterior mediastinum: Secondary | ICD-10-CM

## 2012-03-04 ENCOUNTER — Ambulatory Visit (HOSPITAL_COMMUNITY)
Admission: RE | Admit: 2012-03-04 | Discharge: 2012-03-04 | Disposition: A | Discharge status: Home / Self Care | Payer: Medicare Other | Source: Ambulatory Visit | Attending: Radiation Oncology | Admitting: Radiation Oncology

## 2012-03-04 DIAGNOSIS — C381 Malignant neoplasm of anterior mediastinum: Secondary | ICD-10-CM

## 2012-03-04 DIAGNOSIS — J90 Pleural effusion, not elsewhere classified: Secondary | ICD-10-CM | POA: Insufficient documentation

## 2012-03-04 DIAGNOSIS — D150 Benign neoplasm of thymus: Secondary | ICD-10-CM | POA: Insufficient documentation

## 2012-03-04 DIAGNOSIS — I2510 Atherosclerotic heart disease of native coronary artery without angina pectoris: Secondary | ICD-10-CM | POA: Insufficient documentation

## 2012-03-04 DIAGNOSIS — K7689 Other specified diseases of liver: Secondary | ICD-10-CM | POA: Insufficient documentation

## 2012-03-04 MED ORDER — IOHEXOL 300 MG/ML  SOLN: 80.0000 mL | Freq: Once | INTRAMUSCULAR | Status: AC | PRN

## 2012-03-07 ENCOUNTER — Encounter: Payer: Self-pay | Admitting: Radiation Oncology

## 2012-03-07 ENCOUNTER — Ambulatory Visit
Admission: RE | Admit: 2012-03-07 | Discharge: 2012-03-07 | Disposition: A | Discharge status: Home / Self Care | Payer: Medicare Other | Source: Ambulatory Visit | Attending: Radiation Oncology | Admitting: Radiation Oncology

## 2012-03-07 ENCOUNTER — Telehealth: Payer: Self-pay | Admitting: *Deleted

## 2012-03-07 VITALS — BP 133/53 | HR 72 | Temp 97.6°F | Resp 20 | Wt 135.2 lb

## 2012-03-07 DIAGNOSIS — C381 Malignant neoplasm of anterior mediastinum: Secondary | ICD-10-CM

## 2012-03-07 NOTE — Progress Notes (Signed)
Follow up  S/p radiation tx Thyoma =Mediatinum=09/04/11-10/13/11 Alert oriented x3, states"no problems at all,I feel fine', no pain , no sob  100% room air sats.  No coughing, eating "too much now" states patient 11:35 AM

## 2012-03-07 NOTE — Progress Notes (Signed)
Radiation Oncology         (336) [phone] ________________________________  Name: [name] MRN: [id number]  Date: 03/07/2012  DOB: 02-21-1925  Follow-Up Visit Note  CC: Pearson Grippe, MD  [name], MD  Diagnosis:   Thymoma  Interval Since Last Radiation:  Approximately 4 and half months   Narrative:  The patient returns today for routine follow-up.  The patient indicates that she is doing very well at this time. She denies any difficulties in terms of shortness of breath or esophagitis. No coughing and her appetite has been good. The patient had a repeat CT scan of the chest completed on 03/04/2012. There was an interval decrease in the prominence of residual low attenuation in the prevascular space. Postoperative changes as well as some radiation changes were seen.                              ALLERGIES:  is allergic to amoxapine and related.  Meds: Current Outpatient Prescriptions  Medication Sig Dispense Refill  . aspirin EC 81 MG tablet Take 1 tablet (81 mg total) by mouth daily.      . carvedilol (COREG) 6.25 MG tablet Take 1 tablet (6.25 mg total) by mouth 2 (two) times daily with a meal.  180 tablet  3  . CIPRODEX otic suspension Place 4 drops into the right ear 2 (two) times daily.       . feeding supplement (ENSURE CLINICAL STRENGTH) LIQD Take 237 mLs by mouth daily.      . Ibuprofen-Diphenhydramine HCl (ADVIL PM) 200-25 MG CAPS Take 1 tablet by mouth at bedtime as needed.       Marland Kitchen levothyroxine (LEVOTHROID) 25 MCG tablet Take 1 tablet (25 mcg total) by mouth daily.  30 tablet  11  . olmesartan (BENICAR) 40 MG tablet Take 0.5 tablets (20 mg total) by mouth daily. 1/2 TAB TWICE A DAY  90 tablet  3  . simvastatin (ZOCOR) 20 MG tablet Take 1 tablet (20 mg total) by mouth every evening.  90 tablet  3  . DISCONTD: pantoprazole (PROTONIX) 40 MG tablet Take 1 tablet (40 mg total) by mouth daily at 12 noon.      Marland Kitchen DISCONTD: ramipril (ALTACE) 5 MG capsule Take 1 capsule (5  mg total) by mouth daily.  30 capsule  1    Physical Findings: The patient is in no acute distress. Patient is alert and oriented.  weight is 135 lb 3.2 oz (61.326 kg). Her oral temperature is 97.6 F (36.4 C). Her blood pressure is 133/53 and her pulse is 72. Her respiration is 20 and oxygen saturation is 100%. .   General: Well-developed, in no acute distress HEENT: Normocephalic, atraumatic Cardiovascular: Regular rate and rhythm Respiratory: Clear to auscultation bilaterally GI: Soft, nontender, normal bowel sounds Extremities: No edema present   Lab Findings: Lab Results  Component Value Date   WBC 10.5 08/17/2011   HGB 10.9* 08/17/2011   HCT 34.4* 08/17/2011   MCV 83.2 08/17/2011   PLT 366.0 08/17/2011     Radiographic Findings: Ct Chest W Contrast  03/04/2012  *RADIOLOGY REPORT*  Clinical Data: Follow-up thymoma.  CT CHEST WITH CONTRAST  Technique:  Multidetector CT imaging of the chest was performed following the standard protocol during bolus administration of intravenous contrast.  Contrast:  80 ml Omnipaque-300.  Comparison: 03/13.  Findings: Interval decrease in the extent of low attenuation within the anterior mediastinum,  now measuring approximately 1.5 x 5.7 cm (previously 3.3 x 7.9 cm).  No definite nodular component. Associated postoperative changes are noted.  No pathologically enlarged mediastinal, hilar or axillary lymph nodes. Specifically, previously seen borderline enlarged precarinal lymph nodes have decreased in size in the interval.  Atherosclerotic calcification of the arterial vasculature including coronary arteries.  Heart size normal. High attenuation is seen along the cardiac apex and may be postoperative in etiology.  No pericardial effusion.  Tiny left pleural effusion.  There may be trace residual right pleural fluid.  Mild radiation change in the juxta mediastinal aspects of both lungs, new from 08/11/2011.  A 3 mm nodule in the right middle lobe (image 27)  is unchanged.  No pleural fluid. Airway is unremarkable.  Incidental imaging of the upper abdomen shows a sub centimeter low attenuation lesion in the dome the liver, too small to characterize. Sub centimeter low attenuation lesion in the spleen is unchanged.  No worrisome lytic or sclerotic lesions. Degenerative changes are seen in the spine.  IMPRESSION:  1.  Interval decrease in prominence of residual low attenuation in the prevascular space, with associated postoperative changes. 2.  New radiation changes in the juxta mediastinal aspects of both lungs. 3.  Tiny left pleural effusion.  Probable trace right pleural fluid.   Original Report Authenticated By: Reyes Ivan, M.D.     Impression:    He 78-year-old female status post resection and postoperative radiotherapy for thymoma. She is doing well with no significant symptoms at this time and her CT scan looked good.  Plan:  She will undergo a repeat CT scan of the chest in 4 months, then followup appointment.  I spent 10 minutes with the patient today, the majority of which was spent counseling the patient on the diagnosis of cancer and coordinating care.   Radene Gunning, M.D., Ph.D.

## 2012-03-07 NOTE — Telephone Encounter (Signed)
CALLED PATIENT TO INFORM OF TEST FOR 07-09-12, SPOKE WITH PATIENT AND SHE IS AWARE OF THIS TEST

## 2012-03-08 ENCOUNTER — Ambulatory Visit: Payer: Medicare Other | Admitting: Radiation Oncology

## 2012-05-20 ENCOUNTER — Other Ambulatory Visit: Payer: Self-pay | Admitting: Otolaryngology

## 2012-06-06 ENCOUNTER — Ambulatory Visit (HOSPITAL_COMMUNITY): Admission: RE | Admit: 2012-06-06 | Payer: Medicare Other | Source: Ambulatory Visit | Admitting: Otolaryngology

## 2012-06-06 ENCOUNTER — Encounter (HOSPITAL_COMMUNITY): Admission: RE | Payer: Self-pay | Source: Ambulatory Visit

## 2012-06-06 SURGERY — MYRINGOTOMY WITH TUBE PLACEMENT
Anesthesia: General | Laterality: Right

## 2012-07-03 ENCOUNTER — Ambulatory Visit: Payer: Medicare Other

## 2012-07-04 ENCOUNTER — Ambulatory Visit: Payer: Medicare Other

## 2012-07-05 ENCOUNTER — Ambulatory Visit
Admission: RE | Admit: 2012-07-05 | Discharge: 2012-07-05 | Disposition: A | Discharge status: Home / Self Care | Payer: Medicare Other | Source: Ambulatory Visit | Attending: Radiation Oncology | Admitting: Radiation Oncology

## 2012-07-05 DIAGNOSIS — C381 Malignant neoplasm of anterior mediastinum: Secondary | ICD-10-CM | POA: Insufficient documentation

## 2012-07-08 ENCOUNTER — Encounter (HOSPITAL_COMMUNITY): Payer: Self-pay

## 2012-07-08 ENCOUNTER — Ambulatory Visit (HOSPITAL_COMMUNITY)
Admission: RE | Admit: 2012-07-08 | Discharge: 2012-07-08 | Disposition: A | Discharge status: Home / Self Care | Payer: Medicare Other | Source: Ambulatory Visit | Attending: Radiation Oncology | Admitting: Radiation Oncology

## 2012-07-08 DIAGNOSIS — D739 Disease of spleen, unspecified: Secondary | ICD-10-CM | POA: Insufficient documentation

## 2012-07-08 DIAGNOSIS — Z923 Personal history of irradiation: Secondary | ICD-10-CM | POA: Insufficient documentation

## 2012-07-08 DIAGNOSIS — K7689 Other specified diseases of liver: Secondary | ICD-10-CM | POA: Insufficient documentation

## 2012-07-08 DIAGNOSIS — C381 Malignant neoplasm of anterior mediastinum: Secondary | ICD-10-CM

## 2012-07-08 DIAGNOSIS — R911 Solitary pulmonary nodule: Secondary | ICD-10-CM | POA: Insufficient documentation

## 2012-07-08 DIAGNOSIS — Z09 Encounter for follow-up examination after completed treatment for conditions other than malignant neoplasm: Secondary | ICD-10-CM | POA: Insufficient documentation

## 2012-07-08 MED ORDER — IOHEXOL 300 MG/ML  SOLN
80.0000 mL | Freq: Once | INTRAMUSCULAR | Status: AC | PRN
  Administered 2012-07-08: 80 mL via INTRAVENOUS

## 2012-07-09 ENCOUNTER — Ambulatory Visit (HOSPITAL_COMMUNITY): Payer: Medicare Other

## 2012-07-11 ENCOUNTER — Ambulatory Visit
Admission: RE | Admit: 2012-07-11 | Discharge: 2012-07-11 | Disposition: A | Discharge status: Home / Self Care | Payer: Medicare Other | Source: Ambulatory Visit | Attending: Radiation Oncology | Admitting: Radiation Oncology

## 2012-07-11 ENCOUNTER — Telehealth: Payer: Self-pay | Admitting: *Deleted

## 2012-07-11 VITALS — BP 136/56 | HR 73 | Temp 97.3°F | Wt 143.4 lb

## 2012-07-11 DIAGNOSIS — C381 Malignant neoplasm of anterior mediastinum: Secondary | ICD-10-CM

## 2012-07-11 NOTE — Progress Notes (Signed)
Patient here for routine follow up completion of radiation on 10/13/2011 for thymoma.Denies pain, shortness of breath or cough.Patient states she feels great.Medication reviewed.Ct of chest performed on 07/08/12 with stable radiation changes.

## 2012-07-11 NOTE — Progress Notes (Signed)
Radiation Oncology         [phone] ________________________________  Name: [name] MRN: [id number]  Date: 07/11/2012  DOB: May 11, 1925  Follow-Up Visit Note  CC: Pearson Grippe, MD  [name], MD  Diagnosis:   Thymoma  Interval Since Last Radiation:  Approximately one year   Narrative:  The patient returns today for routine follow-up.  The patient indicates that she has been doing very well. She relates no difficulties with breathing, no cough at this time. She states that she is able to swallow without difficulties. She is essentially without any relevant complaints today. The patient underwent a recent CT scan of the chest and returns to clinic today to review this result.                              ALLERGIES:  is allergic to amoxapine and related.  Meds: Current Outpatient Prescriptions  Medication Sig Dispense Refill  . aspirin EC 81 MG tablet Take 1 tablet (81 mg total) by mouth daily.      . carvedilol (COREG) 6.25 MG tablet Take 1 tablet (6.25 mg total) by mouth 2 (two) times daily with a meal.  180 tablet  3  . Ibuprofen-Diphenhydramine HCl (ADVIL PM) 200-25 MG CAPS Take 1 tablet by mouth at bedtime as needed.       Marland Kitchen levothyroxine (LEVOTHROID) 25 MCG tablet Take 1 tablet (25 mcg total) by mouth daily.  30 tablet  11  . olmesartan (BENICAR) 40 MG tablet Take 0.5 tablets (20 mg total) by mouth daily. 1/2 TAB TWICE A DAY  90 tablet  3  . simvastatin (ZOCOR) 20 MG tablet Take 1 tablet (20 mg total) by mouth every evening.  90 tablet  3  . [DISCONTINUED] pantoprazole (PROTONIX) 40 MG tablet Take 1 tablet (40 mg total) by mouth daily at 12 noon.      . [DISCONTINUED] ramipril (ALTACE) 5 MG capsule Take 1 capsule (5 mg total) by mouth daily.  30 capsule  1    Physical Findings: The patient is in no acute distress. Patient is alert and oriented.  weight is 143 lb 6.4 oz (65.046 kg). Her temperature is 97.3 F (36.3 C). Her blood pressure is 136/56 and her pulse  is 73. Her oxygen saturation is 100%. .   General: Well-developed, in no acute distress HEENT: Normocephalic, atraumatic Cardiovascular: Regular rate and rhythm Respiratory: Clear to auscultation bilaterally GI: Soft, nontender, normal bowel sounds Extremities: No edema present   Lab Findings: Lab Results  Component Value Date   WBC 10.5 08/17/2011   HGB 10.9* 08/17/2011   HCT 34.4* 08/17/2011   MCV 83.2 08/17/2011   PLT 366.0 08/17/2011     Radiographic Findings: Ct Chest W Contrast  07/08/2012  *RADIOLOGY REPORT*  Clinical Data: Follow-up thymoma status post resection and radiation therapy completed July 2013.  CT CHEST WITH CONTRAST  Technique:  Multidetector CT imaging of the chest was performed following the standard protocol during bolus administration of intravenous contrast.  Contrast: 80mL OMNIPAQUE IOHEXOL 300 MG/ML  SOLN  Comparison: Chest CT 08/11/2011 and 03/04/2012.  Findings: The tiny residual precarinal lymph nodes are unchanged. There are no pathologically enlarged mediastinal or hilar lymph nodes.  There has been near-complete resolution of the previously demonstrated residual fluid in the prevascular space.  There is no soft tissue nodularity.  There is no significant residual pericardial effusion.  High density along the cardiac  apex is unchanged, likely postoperative. There is adjacent prominence of the left ventricular lumen at the cardiac apex suspicious for a small apical aneurysm, unchanged.  The pleural effusions have resolved.  There are stable radiation changes anteriorly in both lungs.  A tiny right middle lobe nodule on image number 29 is stable.  Small low density hepatic and splenic lesions are stable.  There is no adrenal nodule. The median sternotomy has healed.  IMPRESSION:  1.  Resolving postsurgical changes within the anterior mediastinum. No recurrent mass identified. 2.  Resolved pleural effusions. 3.  Stable radiation changes centrally in both lungs. 4.   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   Original Report Authenticated By: Carey Bullocks, M.D.     Impression:    The patient is doing well symptomatically. I've had a chance to personally review the patient's CT scan. This showed some stable postoperative and radiation changes with no evidence of recurrent/progressive disease.  Plan:  The patient will undergo a repeat CT scan of the chest in 6 months, then followup appointment.   Radene Gunning, M.D., Ph.D.

## 2012-07-11 NOTE — Telephone Encounter (Signed)
CALLED PATIENT TO INFORM OF TEST AND FU FOR 01-2013 , LVM FOR A RETURN CALL

## 2012-07-31 ENCOUNTER — Other Ambulatory Visit: Payer: Self-pay | Admitting: Otolaryngology

## 2012-07-31 IMAGING — CT CT CHEST W/ CM
2 of 4 series · 15 of 36 positions shown, 18 images · IV contrast (APPLIED)
Comparison: 07/31/2011

CLINICAL DATA: Thymoma.  Status post resection.

CT CHEST WITH CONTRAST
TECHNIQUE: Multidetector CT imaging of the chest was performed
following the standard protocol during bolus administration of
intravenous contrast.
Contrast: 80mL OMNIPAQUE IOHEXOL 300 MG/ML IJ SOLN

[Series 2: chest with st · axial · 0.68mm/px · z∈[-369,-79]mm · 12 of 68 slices shown, 15 images]
[im 5/68  mediastinal]
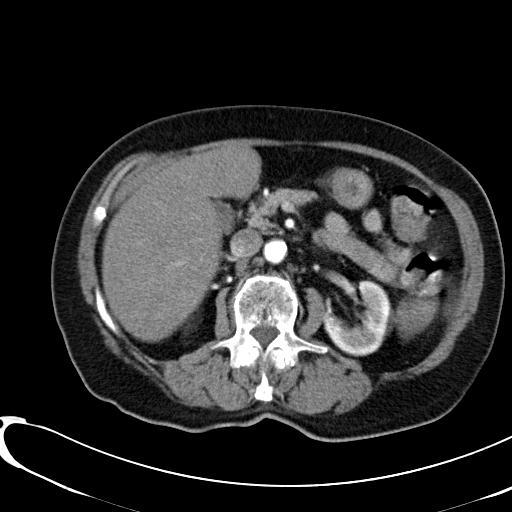
[im 5/68  lung]
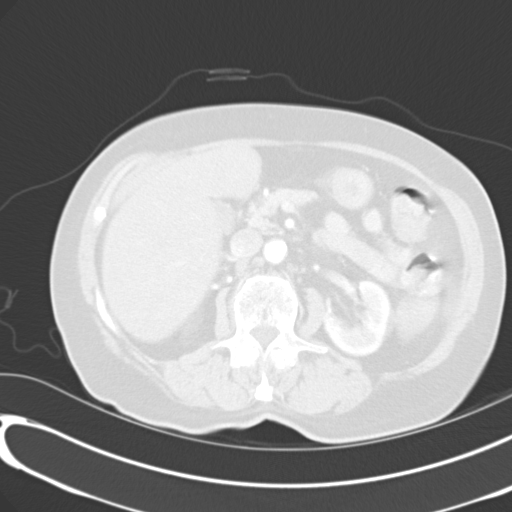
[im 10/68  lung]
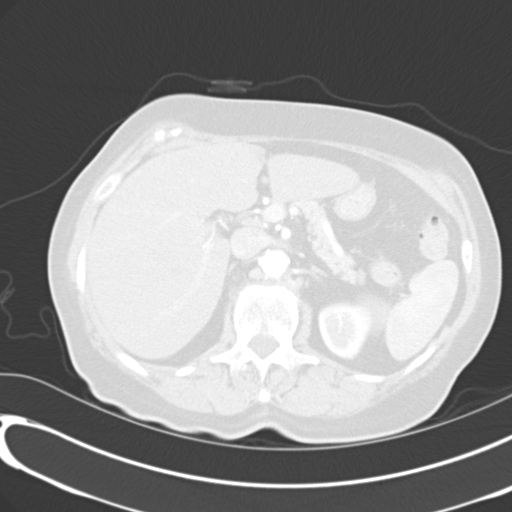
[im 15/68  lung]
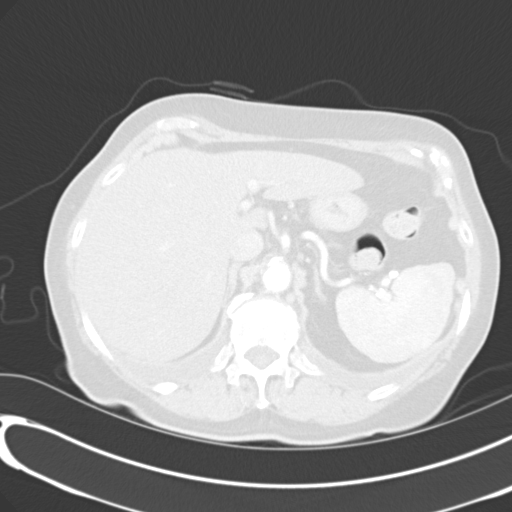
[im 20/68  lung]
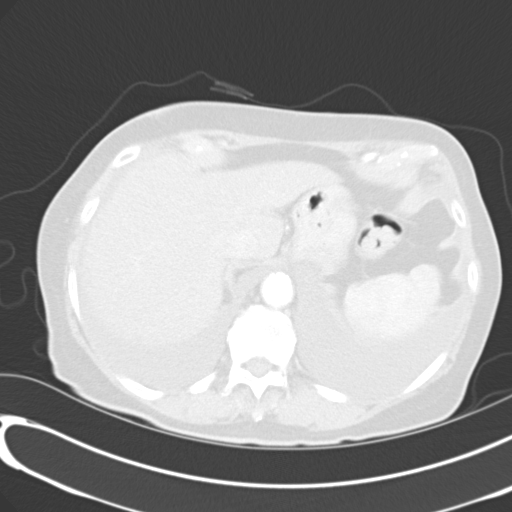
[im 24/68  mediastinal]
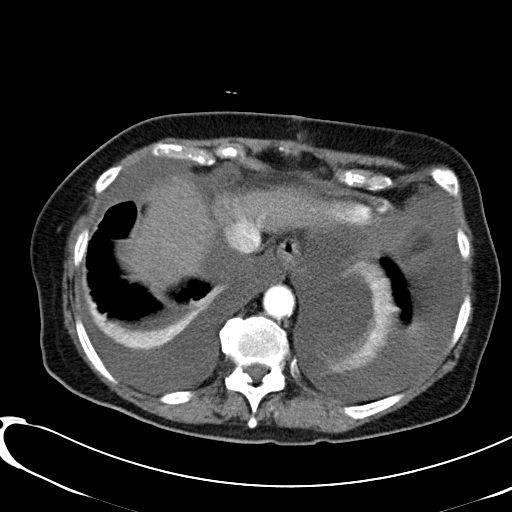
[im 24/68  lung]
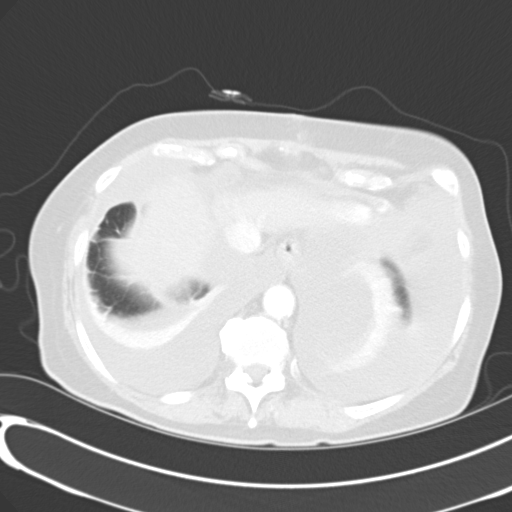
[im 29/68  lung]
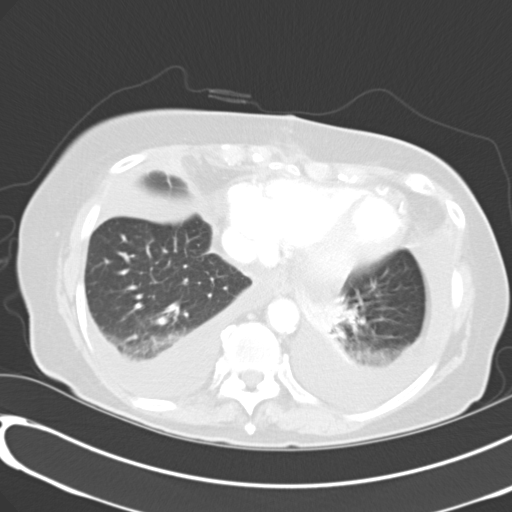
[im 39/68  lung]
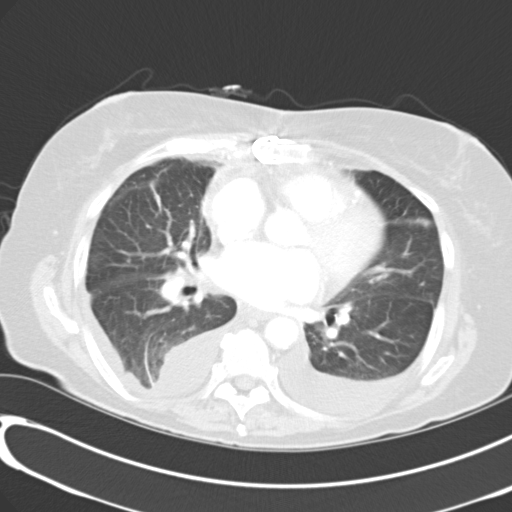
[im 44/68  lung]
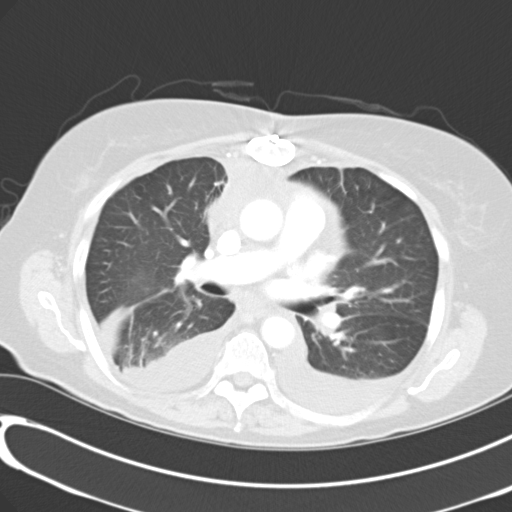
[im 48/68  mediastinal]
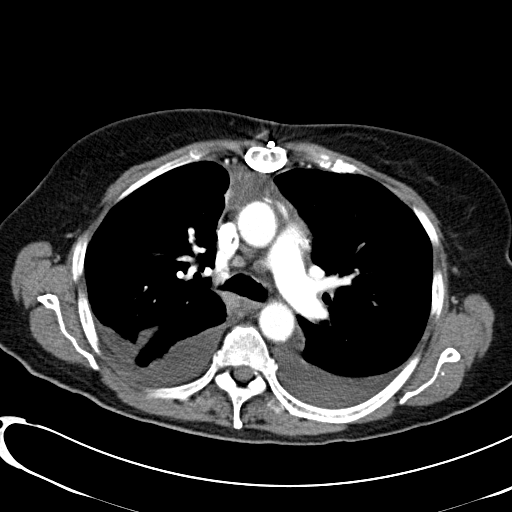
[im 48/68  lung]
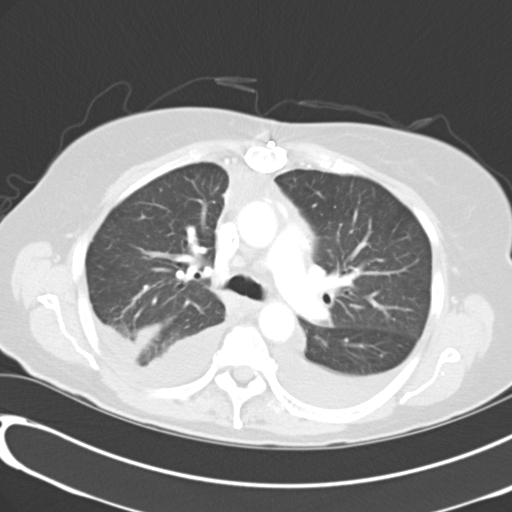
[im 53/68  lung]
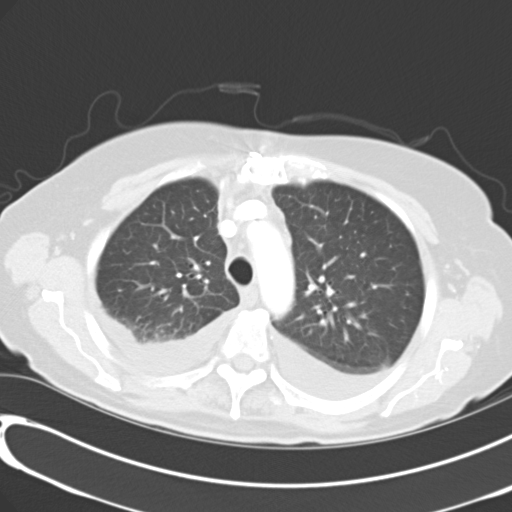
[im 58/68  lung]
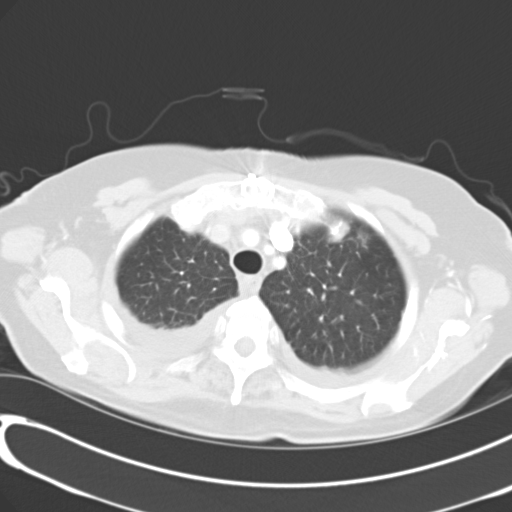
[im 63/68  lung]
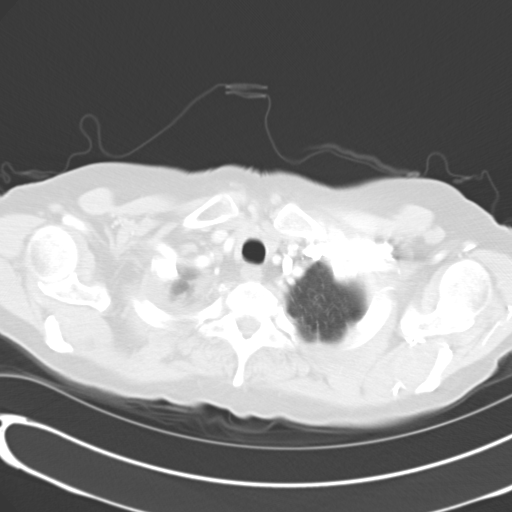

[Series 602: <mpr thick range> · coronal · 0.68mm/px · 3 of 77 slices shown]
[im 16/77  lung]
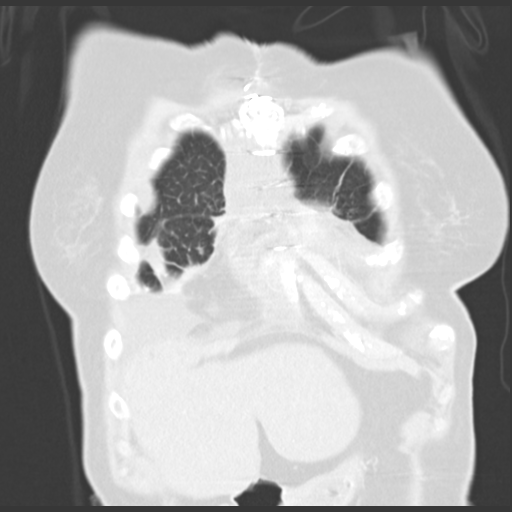
[im 31/77  lung]
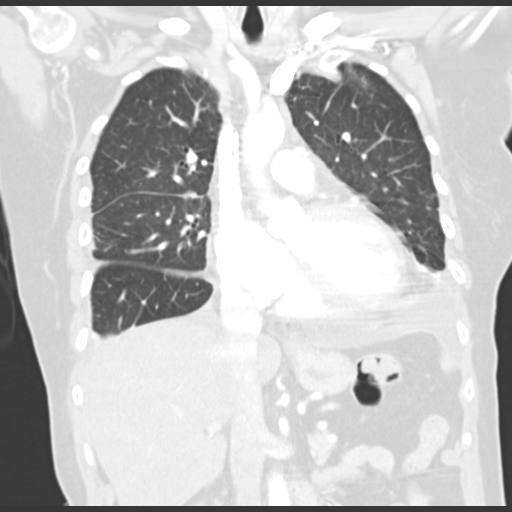
[im 46/77  lung]
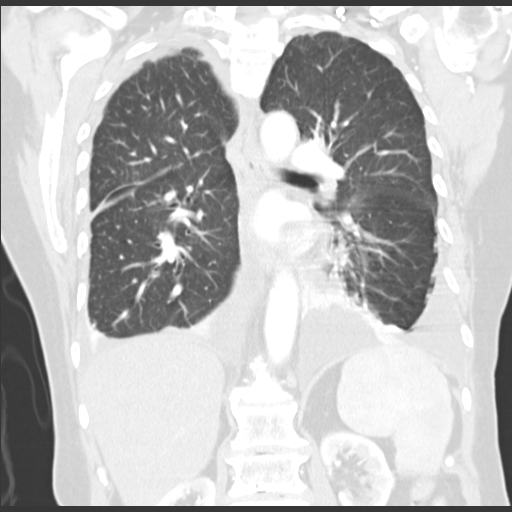

[15 of 36 positions shown; findings below may reference images not displayed]

FINDINGS: No enlarged axillary or supraclavicular adenopathy.

Postoperative change compatible with median sternotomy identified.

Precarinal lymph node measures 1 cm, image 20.  Adjacent lymph node
measures 0.9 cm.

Bilateral hilar lymph nodes measure up to 1 cm.

Postoperative change within the anterior mediastinum is identified
corresponding to call thymoma resection.

There is a moderate amount of low density fluid identified within
the anterior mediastinum.  No enhancing mass is identified within
the mediastinum.

Bilateral pleural effusions noted.

No pulmonary parenchymal nodules or masses identified.

Review of the visualized osseous structures is significant for mild
multilevel spondylosis.

Limited imaging through the upper abdomen is unremarkable.
IMPRESSION: 1.  Postoperative change compatible with median sternotomy and
thymoma resection.
2.  There is a moderate amount of fluid identified within the
anterior mediastinum without enhancing soft tissue nodule or mass.
3.  Borderline enlarged precarinal lymph nodes identified. Close
radiographic followup is advised to ensure stability.
4. Moderate volume bilateral pleural effusions.

## 2012-08-08 ENCOUNTER — Encounter (HOSPITAL_COMMUNITY): Payer: Self-pay | Admitting: Pharmacy Technician

## 2012-08-15 ENCOUNTER — Encounter (HOSPITAL_COMMUNITY)
Admission: RE | Admit: 2012-08-15 | Discharge: 2012-08-15 | Disposition: A | Discharge status: Home / Self Care | Payer: Medicare Other | Source: Ambulatory Visit | Attending: Otolaryngology | Admitting: Otolaryngology

## 2012-08-15 ENCOUNTER — Encounter (HOSPITAL_COMMUNITY): Payer: Self-pay

## 2012-08-15 ENCOUNTER — Ambulatory Visit (HOSPITAL_COMMUNITY)
Admission: RE | Admit: 2012-08-15 | Discharge: 2012-08-15 | Disposition: A | Discharge status: Home / Self Care | Payer: Medicare Other | Source: Ambulatory Visit | Attending: Otolaryngology | Admitting: Otolaryngology

## 2012-08-15 ENCOUNTER — Other Ambulatory Visit: Payer: Self-pay | Admitting: Nurse Practitioner

## 2012-08-15 DIAGNOSIS — Z01818 Encounter for other preprocedural examination: Secondary | ICD-10-CM | POA: Insufficient documentation

## 2012-08-15 DIAGNOSIS — Z951 Presence of aortocoronary bypass graft: Secondary | ICD-10-CM | POA: Insufficient documentation

## 2012-08-15 DIAGNOSIS — J4489 Other specified chronic obstructive pulmonary disease: Secondary | ICD-10-CM | POA: Insufficient documentation

## 2012-08-15 DIAGNOSIS — J90 Pleural effusion, not elsewhere classified: Secondary | ICD-10-CM | POA: Insufficient documentation

## 2012-08-15 DIAGNOSIS — J449 Chronic obstructive pulmonary disease, unspecified: Secondary | ICD-10-CM | POA: Insufficient documentation

## 2012-08-15 HISTORY — DX: Shortness of breath: R06.02

## 2012-08-15 LAB — CBC
HCT: 26.4 % — ABNORMAL LOW (ref 36.0–46.0)
Hemoglobin: 8.5 g/dL — ABNORMAL LOW (ref 12.0–15.0)
MCV: 86.3 fL (ref 78.0–100.0)
RDW: 15.1 % (ref 11.5–15.5)
WBC: 10.8 10*3/uL — ABNORMAL HIGH (ref 4.0–10.5)

## 2012-08-15 LAB — BASIC METABOLIC PANEL
BUN: 26 mg/dL — ABNORMAL HIGH (ref 6–23)
CO2: 21 mEq/L (ref 19–32)
Chloride: 109 mEq/L (ref 96–112)
Creatinine, Ser: 1.09 mg/dL (ref 0.50–1.10)
Potassium: 4.4 mEq/L (ref 3.5–5.1)

## 2012-08-15 LAB — SURGICAL PCR SCREEN: Staphylococcus aureus: NEGATIVE

## 2012-08-15 NOTE — Pre-Procedure Instructions (Cosign Needed)
[name]  08/15/2012   Your procedure is scheduled on:  Thursday August 22, 2012  Report to [facility] Short Stay Center at 0530 AM.  Call this number if you have problems the morning of surgery: [phone]   Remember:   Do not eat food or drink liquids after midnight.   Take these medicines the morning of surgery with A SIP OF WATER: Levothroid   Do not wear jewelry, make-up or nail polish.  Do not wear lotions, powders, or perfumes. You may wear deodorant.  Do not shave 48 hours prior to surgery.   Do not bring valuables to the hospital.  Contacts, dentures or bridgework may not be worn into surgery.  Leave suitcase in the car. After surgery it may be brought to your room.  For patients admitted to the hospital, checkout time is 11:00 AM the day of  discharge.   Patients discharged the day of surgery will not be allowed to drive  home.  Name and phone number of your driver: [name]  Special Instructions: Shower using CHG 2 nights before surgery and the night before surgery.  If you shower the day of surgery use CHG.  Use special wash - you have one bottle of CHG for all showers.  You should use approximately 1/3 of the bottle for each shower.   Please read over the following fact sheets that you were given: Pain Booklet, Coughing and Deep Breathing, MRSA Information and Surgical Site Infection Prevention

## 2012-08-15 NOTE — Progress Notes (Signed)
1700  Spoke with Rivka Barbara at Dr. Jearld Fenton' office...Marland KitchenMarland KitchenMarland Kitchenrelayed info that hgb 8.5 and wbc 10.8    DA

## 2012-08-16 ENCOUNTER — Encounter (HOSPITAL_COMMUNITY): Payer: Self-pay | Admitting: Vascular Surgery

## 2012-08-16 NOTE — Progress Notes (Signed)
Anesthesia chart: Patient is an 79 year old female scheduled for right tube placement with cerumen removal and nasal endoscopy for chronic otitis media by Dr. Jearld Fenton on 08/22/2012. I was not asked to evaluate patient at her PAT visit.  History includes out of hospital acute anterior MI with post infarct acute VSD s/p emergency CABG X 2 (sequential SVG to diagonal and LAD, and bovine pericardial patch repair of VSD) 07/07/11, an anterior mediastinal mass was noted at that time of her CABG and was resected with pathology showing thymoma and is now s/p radiation (completed 12/2011; Dr. Mitzi Hansen), ischemic cardiomyopathy, HTN, hypothyroidism, non-smoker. PCP is Dr. Pearson Grippe.    Cardiologist is Dr. Eden Emms., last visit 02/08/12.  His note from 10/12/11 mention repeating echo in one year.  Despite her ischemic DCM, he did not think an AICD should be considered at that time due to her advanced age and thymoma treatment.  EKG on 08/15/12 showed NSR, possible LAE, inferior infarct (age undetermined), anterior infarct (old), T wave abnormality, consider lateral ischemia.  Her anterior ST segment elevation has improved and her lateral T wave abnormality is slightly more pronounced in aVL since July 21, 2011.   Echo on 09/19/11 showed: - Left ventricle: The cavity size was normal. Wall thickness was normal. Systolic function was moderately to severely reduced. The estimated ejection fraction was in the range of 30% to 35%. There is akinesis of the mid-distalanteroseptal and apical myocardium. - Mitral valve: Mild regurgitation. - Right ventricle: Systolic function was mildly reduced. - Pericardium, extracardiac: A trivial pericardial effusion was identified. Impressions: There appears to be a small VSD in the distal muscular septum.  One year follow-up was recommended.  Cardiac cath on 07/07/11 (prior to CABG) showed:  Coronary dominance: right. Left mainstem: Normal. Left anterior descending (LAD): 100% occluded in mid vessel. D1:  80% proximal. Left circumflex (LCx): normal. Right coronary artery (RCA): 40% distal and 60% PDA. Left ventriculography: Anterior and apical dyskinesis. Basal hyperdynamic function. Overall EF 35%. Large VSD with dye entering the RV.  Chest CT with contrast on 07/08/12 showed: 1. Resolving postsurgical changes within the anterior mediastinum. No recurrent mass identified.  2. Resolved pleural effusions.  3. Stable radiation changes centrally in both lungs.  4. 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  CXR on 08/15/12 showed COPD with small right pleural effusion (significantly improved since 08/14/11). Negative for heart failure.  Preoperative labs show a WBC 10.8, H/H 8.5/26.4, PLT 300K, Cr 1.09, glucose 116.  (Her H/H last 08/17/11 was 10.9/34.4.)  Glenda at Dr. Tona Sensing office has already been notified of patient's CBC results by PAT RN Debbie.  I routed CBC results in to Dr. Selena Batten via his Epic in-basket and will also fax a report to his office.  (His office is closed for the rest of the day.)    Patient was seen by her cardiologist approximately six months ago and by notes appeared to be doing well from a cardiac standpoint.  No recurrent mediastinal mass was seen on recent CT, and she has had continued improvement of her pleural effusion by recent CXR. Anemia is more pronounced--will defer further evaluation/treatment to Dr. Selena Batten.  Vitals were stable on 08/15/12. I reviewed above with Anesthesiologist Dr. Katrinka Blazing.  If patient had no significant symptoms from her anemia and had no new CV/CHF symptoms then he anticipated she could proceed with this procedure as planned.  She will be evaluated by her assigned anesthesiologist on the day of surgery.      Shonna Chock,  PA-C [facility] Stay [facility]/Anesthesiology Phone [phone] 08/16/2012 1:14 PM

## 2012-08-22 ENCOUNTER — Ambulatory Visit (HOSPITAL_COMMUNITY): Admission: RE | Admit: 2012-08-22 | Payer: Medicare Other | Source: Ambulatory Visit | Admitting: Otolaryngology

## 2012-08-22 ENCOUNTER — Encounter (HOSPITAL_COMMUNITY): Admission: RE | Payer: Self-pay | Source: Ambulatory Visit

## 2012-08-22 SURGERY — MYRINGOTOMY WITH TUBE PLACEMENT
Anesthesia: General | Laterality: Right

## 2012-10-18 ENCOUNTER — Encounter (HOSPITAL_COMMUNITY): Payer: Self-pay | Admitting: Pharmacy Technician

## 2012-10-22 ENCOUNTER — Encounter (HOSPITAL_COMMUNITY): Payer: Self-pay

## 2012-10-22 ENCOUNTER — Encounter (HOSPITAL_COMMUNITY)
Admission: RE | Admit: 2012-10-22 | Discharge: 2012-10-22 | Disposition: A | Discharge status: Home / Self Care | Payer: Medicare Other | Source: Ambulatory Visit | Attending: Otolaryngology | Admitting: Otolaryngology

## 2012-10-22 HISTORY — DX: Other seasonal allergic rhinitis: J30.2

## 2012-10-22 HISTORY — DX: Unspecified hemorrhoids: K64.9

## 2012-10-22 LAB — BASIC METABOLIC PANEL
CO2: 21 mEq/L (ref 19–32)
Calcium: 9.5 mg/dL (ref 8.4–10.5)
Chloride: 106 mEq/L (ref 96–112)
Glucose, Bld: 151 mg/dL — ABNORMAL HIGH (ref 70–99)
Sodium: 139 mEq/L (ref 135–145)

## 2012-10-22 LAB — CBC
HCT: 38.8 % (ref 36.0–46.0)
Hemoglobin: 12.5 g/dL (ref 12.0–15.0)
MCH: 25.7 pg — ABNORMAL LOW (ref 26.0–34.0)
MCV: 79.7 fL (ref 78.0–100.0)
Platelets: 241 10*3/uL (ref 150–400)
RBC: 4.87 MIL/uL (ref 3.87–5.11)
WBC: 8.6 10*3/uL (ref 4.0–10.5)

## 2012-10-22 NOTE — Pre-Procedure Instructions (Signed)
[name]  10/22/2012   Your procedure is scheduled on: Thursday, May 22nd.  Report to [facility] at 7:00 AM.  Call this number if you have problems the morning of surgery: [phone]   Remember:   Do not eat food or drink liquids after midnight.   Take these medicines the morning of surgery with A SIP OF WATER:  Cavediolol (Coreg), Levothyroxine ( Synthyroid), Loratadine (Claritin).  Use : Flonase.   Do not wear jewelry, make-up or nail polish.  Do not wear lotions, powders, or perfumes. You may wear deodorant.  Do not shave 48 hours prior to surgery.  Do not bring valuables to the hospital.  Contacts, dentures or bridgework may not be worn into surgery.  Leave suitcase in the car. After surgery it may be brought to your room.  For patients admitted to the hospital, checkout time is 11:00 AM the day of discharge.   Patients discharged the day of surgery will not be allowed to drive home.  Name and phone number of your driver: -     Special Instructions: Shower using CHG 2 nights before surgery and the night before surgery.  If you shower the day of surgery use CHG.  Use special wash - you have one bottle of CHG for all showers.  You should use approximately 1/3 of the bottle for each shower.   Please read over the following fact sheets that you were given: Pain Booklet, Coughing and Deep Breathing and Surgical Site Infection Prevention

## 2012-10-22 NOTE — Progress Notes (Signed)
Dr Eden Emms is patients cardiologist, Pt denies pain, SOB with exertion.Pt was schedule for this surgEry earlier this year, however "iron was low, so they put me on iron".   Pt said she followed up with Dr Pearson Grippe and he gave he gave her the go ahead for or.  I will leave chart for [name] to review- EF low 03- 35%.

## 2012-10-23 NOTE — Progress Notes (Signed)
Anesthesia Note:  Please refer to my original note from encounter date 08/15/12.  Surgery was rescheduled from March to 10/24/12, presumed to allow for evaluation/treatment of her anemia as now her H/H are up to 12.5/38.8 from 8.5/26.4.  I had previously reviewed her history with anesthesiologist Dr. Katrinka Blazing.  Velna Ochs [facility] Stay [facility]/Anesthesiology Phone [phone] 10/23/2012 10:15 AM

## 2012-10-24 ENCOUNTER — Encounter (HOSPITAL_COMMUNITY)
Admission: RE | Disposition: A | Discharge status: Home / Self Care | Payer: Self-pay | Source: Ambulatory Visit | Attending: Otolaryngology

## 2012-10-24 ENCOUNTER — Ambulatory Visit (HOSPITAL_COMMUNITY): Payer: Medicare Other | Admitting: Anesthesiology

## 2012-10-24 ENCOUNTER — Ambulatory Visit (HOSPITAL_COMMUNITY)
Admission: RE | Admit: 2012-10-24 | Discharge: 2012-10-24 | Disposition: A | Discharge status: Home / Self Care | Payer: Medicare Other | Source: Ambulatory Visit | Attending: Otolaryngology | Admitting: Otolaryngology

## 2012-10-24 ENCOUNTER — Encounter (HOSPITAL_COMMUNITY): Payer: Self-pay | Admitting: Vascular Surgery

## 2012-10-24 ENCOUNTER — Encounter (HOSPITAL_COMMUNITY): Payer: Self-pay | Admitting: *Deleted

## 2012-10-24 DIAGNOSIS — H6590 Unspecified nonsuppurative otitis media, unspecified ear: Secondary | ICD-10-CM | POA: Insufficient documentation

## 2012-10-24 DIAGNOSIS — H6980 Other specified disorders of Eustachian tube, unspecified ear: Secondary | ICD-10-CM | POA: Insufficient documentation

## 2012-10-24 DIAGNOSIS — I2589 Other forms of chronic ischemic heart disease: Secondary | ICD-10-CM | POA: Insufficient documentation

## 2012-10-24 DIAGNOSIS — H6120 Impacted cerumen, unspecified ear: Secondary | ICD-10-CM | POA: Insufficient documentation

## 2012-10-24 DIAGNOSIS — H6990 Unspecified Eustachian tube disorder, unspecified ear: Secondary | ICD-10-CM | POA: Insufficient documentation

## 2012-10-24 DIAGNOSIS — I10 Essential (primary) hypertension: Secondary | ICD-10-CM | POA: Insufficient documentation

## 2012-10-24 DIAGNOSIS — Z923 Personal history of irradiation: Secondary | ICD-10-CM | POA: Insufficient documentation

## 2012-10-24 DIAGNOSIS — E039 Hypothyroidism, unspecified: Secondary | ICD-10-CM | POA: Insufficient documentation

## 2012-10-24 DIAGNOSIS — Z7982 Long term (current) use of aspirin: Secondary | ICD-10-CM | POA: Insufficient documentation

## 2012-10-24 DIAGNOSIS — I252 Old myocardial infarction: Secondary | ICD-10-CM | POA: Insufficient documentation

## 2012-10-24 DIAGNOSIS — Z8589 Personal history of malignant neoplasm of other organs and systems: Secondary | ICD-10-CM | POA: Insufficient documentation

## 2012-10-24 DIAGNOSIS — H6981 Other specified disorders of Eustachian tube, right ear: Secondary | ICD-10-CM

## 2012-10-24 DIAGNOSIS — I2510 Atherosclerotic heart disease of native coronary artery without angina pectoris: Secondary | ICD-10-CM | POA: Insufficient documentation

## 2012-10-24 DIAGNOSIS — IMO0002 Reserved for concepts with insufficient information to code with codable children: Secondary | ICD-10-CM | POA: Insufficient documentation

## 2012-10-24 DIAGNOSIS — J3489 Other specified disorders of nose and nasal sinuses: Secondary | ICD-10-CM | POA: Insufficient documentation

## 2012-10-24 DIAGNOSIS — M1710 Unilateral primary osteoarthritis, unspecified knee: Secondary | ICD-10-CM | POA: Insufficient documentation

## 2012-10-24 DIAGNOSIS — Z79899 Other long term (current) drug therapy: Secondary | ICD-10-CM | POA: Insufficient documentation

## 2012-10-24 DIAGNOSIS — Z888 Allergy status to other drugs, medicaments and biological substances status: Secondary | ICD-10-CM | POA: Insufficient documentation

## 2012-10-24 HISTORY — PX: NASAL ENDOSCOPY WITH EPISTAXIS CONTROL: SHX5664

## 2012-10-24 HISTORY — PX: MYRINGOTOMY WITH TUBE PLACEMENT: SHX5663

## 2012-10-24 SURGERY — MYRINGOTOMY WITH TUBE PLACEMENT
Anesthesia: General | Site: Nose | Laterality: Right | Wound class: Clean Contaminated

## 2012-10-24 MED ORDER — CIPROFLOXACIN-DEXAMETHASONE 0.3-0.1 % OT SUSP: 4.0000 [drp] | Freq: Two times a day (BID) | OTIC | Status: DC

## 2012-10-24 MED ORDER — OXYMETAZOLINE HCL 0.05 % NA SOLN
NASAL | Status: AC
  Filled 2012-10-24: qty 15

## 2012-10-24 MED ORDER — ACETAMINOPHEN 10 MG/ML IV SOLN: 1000.0000 mg | Freq: Once | INTRAVENOUS | Status: DC | PRN

## 2012-10-24 MED ORDER — LACTATED RINGERS IV SOLN
INTRAVENOUS | Status: DC | PRN
  Administered 2012-10-24: 08:00:00 via INTRAVENOUS

## 2012-10-24 MED ORDER — LIDOCAINE HCL (CARDIAC) 20 MG/ML IV SOLN
INTRAVENOUS | Status: DC | PRN
  Administered 2012-10-24: 10 mg via INTRAVENOUS

## 2012-10-24 MED ORDER — CIPROFLOXACIN-DEXAMETHASONE 0.3-0.1 % OT SUSP
OTIC | Status: AC
  Filled 2012-10-24: qty 7.5

## 2012-10-24 MED ORDER — DROPERIDOL 2.5 MG/ML IJ SOLN: 0.6250 mg | INTRAMUSCULAR | Status: DC | PRN

## 2012-10-24 MED ORDER — FENTANYL CITRATE 0.05 MG/ML IJ SOLN: 25.0000 ug | INTRAMUSCULAR | Status: DC | PRN

## 2012-10-24 MED ORDER — ONDANSETRON HCL 4 MG/2ML IJ SOLN
INTRAMUSCULAR | Status: DC | PRN
  Administered 2012-10-24: 4 mg via INTRAVENOUS

## 2012-10-24 MED ORDER — CIPROFLOXACIN-DEXAMETHASONE 0.3-0.1 % OT SUSP
OTIC | Status: DC | PRN
  Administered 2012-10-24: 4 [drp] via OTIC

## 2012-10-24 MED ORDER — PROPOFOL 10 MG/ML IV BOLUS
INTRAVENOUS | Status: DC | PRN
  Administered 2012-10-24: 50 mg via INTRAVENOUS

## 2012-10-24 MED ORDER — OXYMETAZOLINE HCL 0.05 % NA SOLN
NASAL | Status: DC | PRN
  Administered 2012-10-24: 1 via NASAL

## 2012-10-24 MED ORDER — BACITRACIN ZINC 500 UNIT/GM EX OINT
TOPICAL_OINTMENT | CUTANEOUS | Status: AC
  Filled 2012-10-24: qty 15

## 2012-10-24 MED ORDER — FENTANYL CITRATE 0.05 MG/ML IJ SOLN
INTRAMUSCULAR | Status: DC | PRN
  Administered 2012-10-24: 25 ug via INTRAVENOUS

## 2012-10-24 SURGICAL SUPPLY — 30 items
BLADE MYRINGOTOMY 6 SPEAR HDL (BLADE) ×3 IMPLANT
CANISTER SUCTION 1200CC (MISCELLANEOUS) ×3 IMPLANT
CANISTER SUCTION 2500CC (MISCELLANEOUS) ×3 IMPLANT
CLOTH BEACON ORANGE TIMEOUT ST (SAFETY) ×3 IMPLANT
COAGULATOR SUCT SWTCH 10FR 6 (ELECTROSURGICAL) ×1 IMPLANT
COVER MAYO STAND STRL (DRAPES) ×3 IMPLANT
CRADLE DONUT ADULT HEAD (MISCELLANEOUS) IMPLANT
DECANTER SPIKE VIAL GLASS SM (MISCELLANEOUS) IMPLANT
DEPRESSOR TONGUE BLADE STERILE (MISCELLANEOUS) IMPLANT
DRAPE PROXIMA HALF (DRAPES) ×3 IMPLANT
ELECT REM PT RETURN 9FT ADLT (ELECTROSURGICAL)
ELECT REM PT RETURN 9FT PED (ELECTROSURGICAL)
ELECTRODE REM PT RETRN 9FT PED (ELECTROSURGICAL) IMPLANT
ELECTRODE REM PT RTRN 9FT ADLT (ELECTROSURGICAL) IMPLANT
GAUZE SPONGE 2X2 8PLY STRL LF (GAUZE/BANDAGES/DRESSINGS) IMPLANT
GLOVE ECLIPSE 7.5 STRL STRAW (GLOVE) ×3 IMPLANT
GOWN PREVENTION PLUS XLARGE (GOWN DISPOSABLE) IMPLANT
KIT BASIN OR (CUSTOM PROCEDURE TRAY) IMPLANT
KIT ROOM TURNOVER OR (KITS) ×3 IMPLANT
PAD ARMBOARD 7.5X6 YLW CONV (MISCELLANEOUS) ×6 IMPLANT
PATTIES SURGICAL 1X1 (DISPOSABLE) ×1 IMPLANT
SOLUTION ANTI FOG 6CC (MISCELLANEOUS) ×1 IMPLANT
SPONGE GAUZE 2X2 STER 10/PKG (GAUZE/BANDAGES/DRESSINGS)
SYR BULB 3OZ (MISCELLANEOUS) ×3 IMPLANT
TOWEL OR 17X24 6PK STRL BLUE (TOWEL DISPOSABLE) ×3 IMPLANT
TUBE CONNECTING 12X1/4 (SUCTIONS) ×3 IMPLANT
TUBE EAR DONALDSON SIL 1.14 (OTOLOGIC RELATED) IMPLANT
TUBE EAR SHEEHY BUTTON 1.27 (OTOLOGIC RELATED) ×1 IMPLANT
TUBE EAR VENT ACTIVENT 1.14 (OTOLOGIC RELATED) ×6 IMPLANT
WATER STERILE IRR 1000ML POUR (IV SOLUTION) ×3 IMPLANT

## 2012-10-24 NOTE — Transfer of Care (Signed)
Immediate Anesthesia Transfer of Care Note  Patient: Arva T Feighner  Procedure(s) Performed: Procedure(s) with comments: MYRINGOTOMY WITH TUBE PLACEMENT (Right) NASAL ENDOSCOPY WITH EPISTAXIS CONTROL (N/A) - nasal endoscopy without epistaxis control... only need 30 minutes   Patient Location: PACU  Anesthesia Type:General  Level of Consciousness: awake, alert , oriented and patient cooperative  Airway & Oxygen Therapy: Patient Spontanous Breathing and Patient connected to nasal cannula oxygen  Post-op Assessment: Report given to PACU RN, Post -op Vital signs reviewed and stable and Patient moving all extremities  Post vital signs: Reviewed and stable  Complications: No apparent anesthesia complications

## 2012-10-24 NOTE — Preoperative (Addendum)
Beta Blockers   Reason not to administer Beta Blockers:Not Applicable 

## 2012-10-24 NOTE — H&P (Signed)
[name] is an 79 y.o. female.   Chief Complaint: ear pressure  HPI: has hx of ear fliud and issues w the ear that require tube  Past Medical History  Diagnosis Date  . Acute MI, anterior wall     a. 07/07/2011  . Osteoarthritis of knee     Bilateral.  a. s/p LTKA ~2007;  b. s/p RTKA 10/2010  . CAD (coronary artery disease)     a. 07/07/2011 anterior MI- Occluded LAD w/ emergent CABG x 2 (SVG->LAD->D1).  . VSD (ventricular septal defect)     a.  07/07/2011 - Noted @ time of MI - s/p repair @ time of CABG;  b. 09/19/2011 Echo: small residula VSD (present on 2/11 echo also)  . HTN (hypertension)   . 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 Radiation initiated 09/2011  . Ischemic cardiomyopathy     a. 07/17/11 Echo: EF 35%;  b. 09/19/2011 Echo: EF 30-35%, Mid-Dist AntSept & Apical AK, mild MR.  Small residual VSD.  Marland Kitchen Hypothyroidism   . History of radiation therapy 09/05/11-10/12/11    thyoma  . Shortness of breath     recently SOB  . Seasonal allergies   . Hemorrhoid     Past Surgical History  Procedure Laterality Date  . Joint replacement    . Total knee arthroplasty      a.  Left ~ 2007, Right 10/2010  . Cataract extraction, bilateral    . Coronary artery bypass graft  07/07/2011    Procedure: CORONARY ARTERY BYPASS GRAFTING (CABG);  Surgeon: Kathlee Nations Suann Larry, MD;  Location: [facility] OR;  Service: Open Heart Surgery;  Laterality: N/A;  times two using greater saphenous vein harvested via endovascular vein harvest  . Vsd repair  07/07/2011    Procedure: VENTRICULAR SEPTAL DEFECT (VSD) REPAIR;  Surgeon: Mikey Bussing, MD;  Location: [facility] OR;  Service: Open Heart Surgery;  Laterality: N/A;  with bovine pericardium 4cm x 4cm  . Mass excision  07/07/2011    Procedure: EXCISION MASS;  Surgeon: Kathlee Nations Suann Larry, MD;  Location: [facility] OR;  Service: Open Heart Surgery;  Laterality: N/A;  Excision of mediastinal mass  . Abdominal hysterectomy    . Hemorrhoid surgery     . Eye surgery Bilateral     cataract  . Tonsillectomy      Family History  Problem Relation Age of Onset  . Heart disease Mother    Social History:  reports that she has never smoked. She has never used smokeless tobacco. She reports that she does not drink alcohol or use illicit drugs.  Allergies:  Allergies  Allergen Reactions  . Amoxapine And Related     Hives    Medications Prior to Admission  Medication Sig Dispense Refill  . aspirin EC 81 MG tablet Take 81 mg by mouth daily.      . carvedilol (COREG) 6.25 MG tablet Take 6.25 mg by mouth 2 (two) times daily with a meal.      . diphenhydramine-acetaminophen (TYLENOL PM) 25-500 MG TABS Take 1 tablet by mouth at bedtime as needed (for sleep.).      Marland Kitchen fluticasone (FLONASE) 50 MCG/ACT nasal spray Place 1-2 sprays into the nose daily.      Marland Kitchen levothyroxine (SYNTHROID, LEVOTHROID) 25 MCG tablet Take 25 mcg by mouth daily before breakfast.      . loratadine (CLARITIN) 10 MG tablet Take 10 mg by mouth daily.      Marland Kitchen  olmesartan (BENICAR) 40 MG tablet Take 20 mg by mouth 2 (two) times daily.      . simvastatin (ZOCOR) 40 MG tablet Take 40 mg by mouth every evening.      . carvedilol (COREG) 6.25 MG tablet Take 1 tablet (6.25 mg total) by mouth 2 (two) times daily with a meal.  180 tablet  3  . levothyroxine (LEVOTHROID) 25 MCG tablet Take 1 tablet (25 mcg total) by mouth daily.  30 tablet  11    Results for orders placed during the hospital encounter of 10/22/12 (from the past 48 hour(s))  SURGICAL PCR SCREEN     Status: None   Collection Time    10/22/12  2:43 PM      Result Value Range   MRSA, PCR NEGATIVE  NEGATIVE   Staphylococcus aureus NEGATIVE  NEGATIVE   Comment:            The Xpert SA Assay (FDA     approved for NASAL specimens     in patients over 51 years of age),     is one component of     a comprehensive surveillance     program.  Test performance has     been validated by [employer] for patients greater      than or equal to 46 year old.     It is not intended     to diagnose infection nor to     guide or monitor treatment.  BASIC METABOLIC PANEL     Status: Abnormal   Collection Time    10/22/12  2:44 PM      Result Value Range   Sodium 139  135 - 145 mEq/L   Potassium 4.5  3.5 - 5.1 mEq/L   Chloride 106  96 - 112 mEq/L   CO2 21  19 - 32 mEq/L   Glucose, Bld 151 (*) 70 - 99 mg/dL   BUN 26 (*) 6 - 23 mg/dL   Creatinine, Ser 1.61  0.50 - 1.10 mg/dL   Calcium 9.5  8.4 - 09.6 mg/dL   GFR calc non Af Amer 45 (*) >90 mL/min   GFR calc Af Amer 52 (*) >90 mL/min   Comment:            The eGFR has been calculated     using the CKD EPI equation.     This calculation has not been     validated in all clinical     situations.     eGFR's persistently     <90 mL/min signify     possible Chronic Kidney Disease.  CBC     Status: Abnormal   Collection Time    10/22/12  2:44 PM      Result Value Range   WBC 8.6  4.0 - 10.5 K/uL   RBC 4.87  3.87 - 5.11 MIL/uL   Hemoglobin 12.5  12.0 - 15.0 g/dL   HCT 04.5  40.9 - 81.1 %   MCV 79.7  78.0 - 100.0 fL   MCH 25.7 (*) 26.0 - 34.0 pg   MCHC 32.2  30.0 - 36.0 g/dL   RDW 91.4 (*) 78.2 - 95.6 %   Platelets 241  150 - 400 K/uL   No results found.  Review of Systems  Constitutional: Negative.   HENT: Negative.   Eyes: Negative.   Skin: Negative.     Blood pressure 119/72, pulse 76, temperature 97 F (36.1  C), temperature source Oral, resp. rate 18, SpO2 98.00%. Physical Exam  Constitutional: She appears well-developed.  HENT:  Nose: Nose normal.  Mouth/Throat: Oropharynx is clear and moist.  Eyes: Pupils are equal, round, and reactive to light.  Neck: Normal range of motion. Neck supple.  Cardiovascular: Normal rate.   Respiratory: Effort normal.  GI: Soft.     Assessment/Plan Chronic serous OM- ready for tube and exam of the nasopharynx  Suzanna Obey 10/24/2012, 8:57 AM

## 2012-10-24 NOTE — Anesthesia Postprocedure Evaluation (Signed)
  Anesthesia Post-op Note  Patient: [name]  Procedure(s) Performed: Procedure(s) with comments: MYRINGOTOMY WITH TUBE PLACEMENT (Right) NASAL ENDOSCOPY WITH EPISTAXIS CONTROL (N/A) - nasal endoscopy without epistaxis control... only need 30 minutes   Patient Location: PACU  Anesthesia Type:General  Level of Consciousness: awake, alert , oriented and patient cooperative  Airway and Oxygen Therapy: Patient Spontanous Breathing and Patient connected to nasal cannula oxygen  Post-op Pain: none  Post-op Assessment: Post-op Vital signs reviewed, Patient's Cardiovascular Status Stable, Respiratory Function Stable, Patent Airway, No signs of Nausea or vomiting and Pain level controlled  Post-op Vital Signs: Reviewed and stable  Complications: No apparent anesthesia complications

## 2012-10-24 NOTE — Op Note (Signed)
Preop/postop diagnoses: Right serous otitis media/cerumen impaction/nasopharyngeal irritation Procedure: Debridement of right ear canal, right myringotomy and tube, nasal endoscopy Anesthesia: Gen. Estimated blood loss: Less than 5 cc Indications: 79 year old with a issue with her right ear with some cerumen problems and eustachian tube dysfunction. She was informed a risk medicine procedure and options were discussed all questions are answered and consent was obtained. Procedure: She was taken to the operating room placed in the supine position after general mask ventilation anesthesia was placed in the left gaze position. Significant hard dried cerumen was removed from the canal under or microscope direction. There was some old blood in the canal did bleed underneath as this skin debris was removed. Once removed the tympanic membrane was very thickened and a myringotomy made in the inferior quadrant. Serous effusion was suctioned. Sheehy tube placed. Ciprodex was instilled. The nose was then addressed with the oxymetazoline pledget placed into the right side. 0 scope was then used to pass into the nose and examine the nasopharynx which looked completely clear of any mass or lesion. She was then awakened brought to cover stable condition counts correct.

## 2012-10-24 NOTE — Anesthesia Preprocedure Evaluation (Addendum)
Anesthesia Evaluation  Patient identified by MRN, date of birth, ID band Patient awake    Reviewed: Allergy & Precautions, H&P , NPO status , Patient's Chart, lab work & pertinent test results, reviewed documented beta blocker date and time   History of Anesthesia Complications Negative for: history of anesthetic complications  Airway Mallampati: II TM Distance: >3 FB Neck ROM: Full    Dental  (+) Dental Advisory Given   Pulmonary shortness of breath,  breath sounds clear to auscultation  Pulmonary exam normal       Cardiovascular hypertension, + CAD, + Past MI (acute MI with cardiogenic shock '13, subsequent CABG with VSD closure), + CABG ('13 with VSD closure) and +CHF + Valvular Problems/Murmurs Rate:Normal  '13 ECHO: EF 30-35%, anteroseptal akinesis   Neuro/Psych negative neurological ROS     GI/Hepatic negative GI ROS, Neg liver ROS, Esophagitis s/p XRT for thymoma   Endo/Other  Hypothyroidism   Renal/GU negative Renal ROS     Musculoskeletal   Abdominal   Peds  Hematology   Anesthesia Other Findings   Reproductive/Obstetrics                          Anesthesia Physical Anesthesia Plan  ASA: III  Anesthesia Plan: General   Post-op Pain Management:    Induction: Intravenous  Airway Management Planned: Oral ETT  Additional Equipment:   Intra-op Plan:   Post-operative Plan: Extubation in OR  Informed Consent: I have reviewed the patients History and Physical, chart, labs and discussed the procedure including the risks, benefits and alternatives for the proposed anesthesia with the patient or authorized representative who has indicated his/her understanding and acceptance.   Dental advisory given  Plan Discussed with: CRNA and Surgeon  Anesthesia Plan Comments: (Plan routine monitors, GETA ADDENDUM: discussed with Dr. Jearld Fenton, plan mask GA, ETT available    Sandford Craze, MD)        Anesthesia Quick Evaluation

## 2012-10-24 NOTE — Anesthesia Procedure Notes (Signed)
Procedures

## 2012-10-25 ENCOUNTER — Encounter (HOSPITAL_COMMUNITY): Payer: Self-pay | Admitting: Otolaryngology

## 2013-01-08 ENCOUNTER — Ambulatory Visit (HOSPITAL_COMMUNITY)
Admission: RE | Admit: 2013-01-08 | Discharge: 2013-01-08 | Disposition: A | Discharge status: Home / Self Care | Payer: Medicare Other | Source: Ambulatory Visit | Attending: Radiation Oncology | Admitting: Radiation Oncology

## 2013-01-08 ENCOUNTER — Encounter (HOSPITAL_COMMUNITY): Payer: Self-pay

## 2013-01-08 ENCOUNTER — Ambulatory Visit
Admission: RE | Admit: 2013-01-08 | Discharge: 2013-01-08 | Disposition: A | Discharge status: Home / Self Care | Payer: Medicare Other | Source: Ambulatory Visit | Attending: Radiation Oncology | Admitting: Radiation Oncology

## 2013-01-08 DIAGNOSIS — C381 Malignant neoplasm of anterior mediastinum: Secondary | ICD-10-CM

## 2013-01-08 DIAGNOSIS — R0602 Shortness of breath: Secondary | ICD-10-CM | POA: Insufficient documentation

## 2013-01-08 DIAGNOSIS — N649 Disorder of breast, unspecified: Secondary | ICD-10-CM | POA: Insufficient documentation

## 2013-01-08 DIAGNOSIS — C37 Malignant neoplasm of thymus: Secondary | ICD-10-CM | POA: Insufficient documentation

## 2013-01-08 DIAGNOSIS — Z923 Personal history of irradiation: Secondary | ICD-10-CM | POA: Insufficient documentation

## 2013-01-08 DIAGNOSIS — T66XXXA Radiation sickness, unspecified, initial encounter: Secondary | ICD-10-CM | POA: Insufficient documentation

## 2013-01-08 DIAGNOSIS — Y842 Radiological procedure and radiotherapy as the cause of abnormal reaction of the patient, or of later complication, without mention of misadventure at the time of the procedure: Secondary | ICD-10-CM | POA: Insufficient documentation

## 2013-01-08 DIAGNOSIS — J90 Pleural effusion, not elsewhere classified: Secondary | ICD-10-CM | POA: Insufficient documentation

## 2013-01-08 LAB — BUN AND CREATININE (CC13)
BUN: 21.9 mg/dL (ref 7.0–26.0)
Creatinine: 1.1 mg/dL (ref 0.6–1.1)

## 2013-01-08 MED ORDER — IOHEXOL 300 MG/ML  SOLN
80.0000 mL | Freq: Once | INTRAMUSCULAR | Status: AC | PRN
  Administered 2013-01-08: 80 mL via INTRAVENOUS

## 2013-01-09 ENCOUNTER — Ambulatory Visit
Admission: RE | Admit: 2013-01-09 | Discharge: 2013-01-09 | Disposition: A | Discharge status: Home / Self Care | Payer: Medicare Other | Source: Ambulatory Visit | Attending: Radiation Oncology | Admitting: Radiation Oncology

## 2013-01-09 VITALS — BP 110/55 | HR 80 | Temp 97.4°F | Ht 61.0 in | Wt 150.2 lb

## 2013-01-09 DIAGNOSIS — C381 Malignant neoplasm of anterior mediastinum: Secondary | ICD-10-CM

## 2013-01-09 NOTE — Progress Notes (Signed)
Radiation Oncology         [phone]) [phone] ________________________________  Name: [name] MRN: [id number]  Date: 01/09/2013  DOB: Mar 17, 1925  Follow-Up Visit Note  CC: Pearson Grippe, MD  [name], MD  Diagnosis:   Thymoma  Interval Since Last Radiation:  One and a half years   Narrative:  The patient returns today for routine follow-up.  The patient states that she has been doing very well. She has been clinically stable. The patient denies any cough or shortness of breath. She states that her appetite is good. Her energy level is also good. No pain in the chest area. The patient underwent repeat CT imaging and presents today for discussion of this.                              ALLERGIES:  is allergic to amoxapine and related.  Meds: Current Outpatient Prescriptions  Medication Sig Dispense Refill  . aspirin EC 81 MG tablet Take 81 mg by mouth daily.      . carvedilol (COREG) 6.25 MG tablet Take 6.25 mg by mouth 2 (two) times daily with a meal.      . diphenhydramine-acetaminophen (TYLENOL PM) 25-500 MG TABS Take 1 tablet by mouth at bedtime as needed (for sleep.).      Marland Kitchen levothyroxine (SYNTHROID, LEVOTHROID) 25 MCG tablet Take 25 mcg by mouth daily before breakfast.      . olmesartan (BENICAR) 40 MG tablet Take 20 mg by mouth 2 (two) times daily.      . simvastatin (ZOCOR) 40 MG tablet Take 40 mg by mouth every evening.      . carvedilol (COREG) 6.25 MG tablet Take 1 tablet (6.25 mg total) by mouth 2 (two) times daily with a meal.  180 tablet  3  . ciprofloxacin-dexamethasone (CIPRODEX) otic suspension Place 4 drops into the right ear 2 (two) times daily.  7.5 mL  0  . fluticasone (FLONASE) 50 MCG/ACT nasal spray Place 1-2 sprays into the nose daily.      Marland Kitchen levothyroxine (LEVOTHROID) 25 MCG tablet Take 1 tablet (25 mcg total) by mouth daily.  30 tablet  11  . loratadine (CLARITIN) 10 MG tablet Take 10 mg by mouth daily.      . [DISCONTINUED] pantoprazole (PROTONIX) 40 MG  tablet Take 1 tablet (40 mg total) by mouth daily at 12 noon.      . [DISCONTINUED] ramipril (ALTACE) 5 MG capsule Take 1 capsule (5 mg total) by mouth daily.  30 capsule  1   No current facility-administered medications for this encounter.    Physical Findings: The patient is in no acute distress. Patient is alert and oriented.  height is 5\' 1"  (1.549 m) and weight is 150 lb 3.2 oz (68.13 kg). Her temperature is 97.4 F (36.3 C). Her blood pressure is 110/55 and her pulse is 80. Her oxygen saturation is 98%. .   General: Well-developed, in no acute distress HEENT: Normocephalic, atraumatic Cardiovascular: Regular rate and rhythm Respiratory: Clear to auscultation bilaterally GI: Soft, nontender, normal bowel sounds Extremities: No edema present   Lab Findings: Lab Results  Component Value Date   WBC 8.6 10/22/2012   HGB 12.5 10/22/2012   HCT 38.8 10/22/2012   MCV 79.7 10/22/2012   PLT 241 10/22/2012     Radiographic Findings: Ct Chest W Contrast  01/08/2013   *RADIOLOGY REPORT*  Clinical Data: History of thymoma.  Radiation  therapy complete. Shortness of breath.  CT CHEST WITH CONTRAST  Technique:  Multidetector CT imaging of the chest was performed following the standard protocol during bolus administration of intravenous contrast.  Contrast: 80mL OMNIPAQUE IOHEXOL 300 MG/ML  SOLN  Comparison: 07/08/2012.  Findings: Postoperative changes of thymoma resection with residual mild soft tissue thickening in the prevascular space, as before. No pathologically enlarged mediastinal, hilar or axillary lymph nodes.  Heart size normal. Calcification and thinning are seen along the left ventricular apex.  Possible small aneurysm as well, as before.  No pericardial effusion.  Tiny hiatal hernia.  Pre pericardiac lymph nodes are sub centimeter in size. An 8 x 11 mm soft tissue nodule in the inferior right breast is unchanged.  Minimal radiation fibrosis in the juxta mediastinal regions of both lungs,  right greater than left, stable.  Scattered subpleural irregularity in the left hemithorax (example images 15 through 17), as before. Trace right pleural effusion.  Airway is unremarkable.  Incidental imaging of the upper abdomen shows a sub centimeter low attenuation lesion in the right hepatic lobe, unchanged.  There may be small stones layering in the gallbladder, incompletely imaged. 7 mm low attenuation lesion in the spleen is unchanged.  No worrisome lytic or sclerotic lesions.  Degenerative changes are seen in the spine.  IMPRESSION:  1.  Postoperative changes of thymoma resection without evidence of recurrent or metastatic disease.  Slight irregularity along the lateral left pleural margin is unchanged.  Continued attention on follow-up exams is warranted. 2.  Trace right pleural fluid. 3.  Small soft tissue lesion in the inferior right breast, unchanged.  Correlation with physical exam and mammography is suggested, as clinically indicated. 4. Calcification and thinning along the left ventricular apex with possible aneurysm, as before. 5.  Possible dependent stone debris in the gallbladder, incompletely imaged.   Original Report Authenticated By: Leanna Battles, M.D.    Impression:    The patient is doing well, clinically NED. The patient's CT scan looked good with no evidence of recurrent or metastatic disease.  Plan:  The patient will undergo a repeat CT scan of the chest in 6 months, then followup appointment.  I spent 10 minutes with the patient today, the majority of which was spent counseling the patient on the diagnosis of cancer and coordinating care.   Radene Gunning, M.D., Ph.D.

## 2013-01-09 NOTE — Progress Notes (Signed)
[name] here for follow up after treatment for a thyoma.  She denies pain, cough, shortness of breath and fatigue.  She states her appetite is "too good" and she doesn't have any problems swallowing.

## 2013-01-13 ENCOUNTER — Telehealth: Payer: Self-pay | Admitting: *Deleted

## 2013-01-13 NOTE — Telephone Encounter (Signed)
CALLED PATIENT TO INFORM OF TEST AND FU VISIT, SPOKE WITH PATIENT AND SHE IS AWARE OF THESE APPTS. 

## 2013-02-04 ENCOUNTER — Encounter: Payer: Self-pay | Admitting: Cardiovascular Disease

## 2013-07-09 ENCOUNTER — Other Ambulatory Visit: Payer: Self-pay | Admitting: *Deleted

## 2013-07-09 NOTE — Telephone Encounter (Signed)
error 

## 2013-07-14 ENCOUNTER — Ambulatory Visit
Admission: RE | Admit: 2013-07-14 | Discharge: 2013-07-14 | Disposition: A | Discharge status: Home / Self Care | Payer: Medicare Other | Source: Ambulatory Visit | Attending: Radiation Oncology | Admitting: Radiation Oncology

## 2013-07-14 ENCOUNTER — Other Ambulatory Visit (HOSPITAL_COMMUNITY): Payer: Medicare Other

## 2013-07-14 ENCOUNTER — Ambulatory Visit (HOSPITAL_COMMUNITY)
Admission: RE | Admit: 2013-07-14 | Discharge: 2013-07-14 | Disposition: A | Discharge status: Home / Self Care | Payer: Medicare Other | Source: Ambulatory Visit | Attending: Radiation Oncology | Admitting: Radiation Oncology

## 2013-07-14 ENCOUNTER — Encounter (HOSPITAL_COMMUNITY): Payer: Self-pay

## 2013-07-14 DIAGNOSIS — C381 Malignant neoplasm of anterior mediastinum: Secondary | ICD-10-CM | POA: Diagnosis present

## 2013-07-14 DIAGNOSIS — R911 Solitary pulmonary nodule: Secondary | ICD-10-CM | POA: Insufficient documentation

## 2013-07-14 DIAGNOSIS — Z923 Personal history of irradiation: Secondary | ICD-10-CM | POA: Insufficient documentation

## 2013-07-14 DIAGNOSIS — Z8529 Personal history of malignant neoplasm of other respiratory and intrathoracic organs: Secondary | ICD-10-CM | POA: Insufficient documentation

## 2013-07-14 DIAGNOSIS — Z9889 Other specified postprocedural states: Secondary | ICD-10-CM | POA: Insufficient documentation

## 2013-07-14 LAB — BUN AND CREATININE (CC13)
BUN: 33 mg/dL — ABNORMAL HIGH (ref 7.0–26.0)
Creatinine: 1.3 mg/dL — ABNORMAL HIGH (ref 0.6–1.1)

## 2013-07-14 MED ORDER — IOHEXOL 300 MG/ML  SOLN
60.0000 mL | Freq: Once | INTRAMUSCULAR | Status: AC | PRN
  Administered 2013-07-14: 60 mL via INTRAVENOUS

## 2013-07-17 ENCOUNTER — Ambulatory Visit
Admission: RE | Admit: 2013-07-17 | Discharge: 2013-07-17 | Disposition: A | Discharge status: Home / Self Care | Payer: Medicare Other | Source: Ambulatory Visit | Attending: Radiation Oncology | Admitting: Radiation Oncology

## 2013-07-17 ENCOUNTER — Encounter: Payer: Self-pay | Admitting: Radiation Oncology

## 2013-07-17 VITALS — BP 124/75 | HR 95 | Temp 97.3°F | Resp 20 | Ht 61.0 in | Wt 155.0 lb

## 2013-07-17 DIAGNOSIS — C381 Malignant neoplasm of anterior mediastinum: Secondary | ICD-10-CM

## 2013-07-17 NOTE — Progress Notes (Signed)
Radiation Oncology         [phone] ________________________________  Name: [name] MRN: [id number]  Date: 07/17/2013  DOB: 11/07/1924  Follow-Up Visit Note  CC: [name], MD  Grace Isaac, MD  Diagnosis:   Thymoma  Interval Since Last Radiation:   The patient received 54 gray to the mediastinum completed on 10/13/2011   Narrative:  The patient returns today for routine follow-up.  The patient states that she has been doing very well. She denies any worsening shortness of breath. No chest pain. No other distant pain. The patient had a recent CT scan of the chest and she presents today for discussion of these results.                              ALLERGIES:  is allergic to amoxapine and related.  Meds: Current Outpatient Prescriptions  Medication Sig Dispense Refill  . aspirin EC 81 MG tablet Take 81 mg by mouth daily.      . calcium carbonate (TUMS - DOSED IN MG ELEMENTAL CALCIUM) 500 MG chewable tablet Chew 1 tablet by mouth.      . carvedilol (COREG) 6.25 MG tablet Take 6.25 mg by mouth 2 (two) times daily with a meal.      . diphenhydramine-acetaminophen (TYLENOL PM) 25-500 MG TABS Take 1 tablet by mouth at bedtime as needed (for sleep.).      Marland Kitchen levothyroxine (SYNTHROID, LEVOTHROID) 25 MCG tablet Take 25 mcg by mouth daily before breakfast.      . loratadine (CLARITIN) 10 MG tablet Take 10 mg by mouth daily.      Marland Kitchen olmesartan (BENICAR) 40 MG tablet Take 20 mg by mouth 2 (two) times daily.      . simvastatin (ZOCOR) 40 MG tablet Take 40 mg by mouth every evening.      . carvedilol (COREG) 6.25 MG tablet Take 1 tablet (6.25 mg total) by mouth 2 (two) times daily with a meal.  180 tablet  3  . fluticasone (FLONASE) 50 MCG/ACT nasal spray Place 1-2 sprays into the nose daily.      Marland Kitchen levothyroxine (LEVOTHROID) 25 MCG tablet Take 1 tablet (25 mcg total) by mouth daily.  30 tablet  11  . [DISCONTINUED] pantoprazole (PROTONIX) 40 MG tablet Take 1 tablet (40 mg total)  by mouth daily at 12 noon.      . [DISCONTINUED] ramipril (ALTACE) 5 MG capsule Take 1 capsule (5 mg total) by mouth daily.  30 capsule  1   No current facility-administered medications for this encounter.    Physical Findings: The patient is in no acute distress. Patient is alert and oriented.  height is 5\' 1"  (1.549 m) and weight is 155 lb (70.308 kg). Her oral temperature is 97.3 F (36.3 C). Her blood pressure is 124/75 and her pulse is 95. Her respiration is 20 and oxygen saturation is 97%. .   General: Well-developed, in no acute distress HEENT: Normocephalic, atraumatic Cardiovascular: Regular rate and rhythm Respiratory: Clear to auscultation bilaterally GI: Soft, nontender, normal bowel sounds Extremities: No edema present   Lab Findings: Lab Results  Component Value Date   WBC 8.6 10/22/2012   HGB 12.5 10/22/2012   HCT 38.8 10/22/2012   MCV 79.7 10/22/2012   PLT 241 10/22/2012     Radiographic Findings: Ct Chest W Contrast  01/08/2013   *RADIOLOGY REPORT*  Clinical Data: History of thymoma.  Radiation therapy  complete. Shortness of breath.  CT CHEST WITH CONTRAST  Technique:  Multidetector CT imaging of the chest was performed following the standard protocol during bolus administration of intravenous contrast.  Contrast: 33mL OMNIPAQUE IOHEXOL 300 MG/ML  SOLN  Comparison: 07/08/2012.  Findings: Postoperative changes of thymoma resection with residual mild soft tissue thickening in the prevascular space, as before. No pathologically enlarged mediastinal, hilar or axillary lymph nodes.  Heart size normal. Calcification and thinning are seen along the left ventricular apex.  Possible small aneurysm as well, as before.  No pericardial effusion.  Tiny hiatal hernia.  Pre pericardiac lymph nodes are sub centimeter in size. An 8 x 11 mm soft tissue nodule in the inferior right breast is unchanged.  Minimal radiation fibrosis in the juxta mediastinal regions of both lungs, right greater  than left, stable.  Scattered subpleural irregularity in the left hemithorax (example images 15 through 17), as before. Trace right pleural effusion.  Airway is unremarkable.  Incidental imaging of the upper abdomen shows a sub centimeter low attenuation lesion in the right hepatic lobe, unchanged.  There may be small stones layering in the gallbladder, incompletely imaged. 7 mm low attenuation lesion in the spleen is unchanged.  No worrisome lytic or sclerotic lesions.  Degenerative changes are seen in the spine.  IMPRESSION:  1.  Postoperative changes of thymoma resection without evidence of recurrent or metastatic disease.  Slight irregularity along the lateral left pleural margin is unchanged.  Continued attention on follow-up exams is warranted. 2.  Trace right pleural fluid. 3.  Small soft tissue lesion in the inferior right breast, unchanged.  Correlation with physical exam and mammography is suggested, as clinically indicated. 4. Calcification and thinning along the left ventricular apex with possible aneurysm, as before. 5.  Possible dependent stone debris in the gallbladder, incompletely imaged.   Original Report Authenticated By: Lorin Picket, M.D.    Impression:    The patient is doing well, clinically NED. The patient's CT scan looked good with no evidence of recurrent or metastatic disease.  Plan:  The patient will undergo a repeat CT scan of the chest in one year, then followup appointment.  NCCN guidelines recommend annual CT scan for a total of 10 years for thymoma. We will decide at each visit if continued surveillance for guidelines continues to make sense for the patient.  I spent 10 minutes with the patient today, the majority of which was spent counseling the patient on the diagnosis of cancer and coordinating care.   Jodelle Gross, M.D., Ph.D.

## 2013-07-17 NOTE — Progress Notes (Addendum)
follow up CT chest done 07/14/13 s/p rad Thyoma , some discomfort reflux stated, takes tums and OTC liquid brand for indigestion stated, appetite fair, energy level fair, no c/o pain, nausea 3:49 PM

## 2014-01-21 ENCOUNTER — Other Ambulatory Visit: Payer: Self-pay | Admitting: Dermatology

## 2014-03-05 ENCOUNTER — Ambulatory Visit (INDEPENDENT_AMBULATORY_CARE_PROVIDER_SITE_OTHER): Payer: Medicare Other | Admitting: Cardiovascular Disease

## 2014-03-05 ENCOUNTER — Encounter: Payer: Self-pay | Admitting: Cardiovascular Disease

## 2014-03-05 VITALS — BP 150/74 | HR 83 | Ht 61.0 in | Wt 154.0 lb

## 2014-03-05 DIAGNOSIS — I25810 Atherosclerosis of coronary artery bypass graft(s) without angina pectoris: Secondary | ICD-10-CM

## 2014-03-05 DIAGNOSIS — Q210 Ventricular septal defect: Secondary | ICD-10-CM

## 2014-03-05 DIAGNOSIS — I255 Ischemic cardiomyopathy: Secondary | ICD-10-CM

## 2014-03-05 DIAGNOSIS — R0600 Dyspnea, unspecified: Secondary | ICD-10-CM

## 2014-03-05 MED ORDER — FUROSEMIDE 20 MG PO TABS: 10.0000 mg | ORAL_TABLET | Freq: Every day | ORAL | Status: DC

## 2014-03-05 NOTE — Patient Instructions (Signed)
Your physician recommends that you schedule a follow-up appointment in: NEXT AVAILABLE WITH  DR [facility] Your physician has recommended you make the following change in your medication:  START  FUROSEMIDE  [facility] [place] physician has requested that you have an echocardiogram. Echocardiography is a painless test that uses sound waves to create images of your heart. It provides your doctor with information about the size and shape of your heart and how well your heart's chambers and valves are working. This procedure takes approximately one hour. There are no restrictions for this procedure.   A chest x-ray takes a picture of the organs and structures inside the chest, including the heart, lungs, and blood vessels. This test can show several things, including, whether the heart is enlarges; whether fluid is building up in the lungs; and whether pacemaker / defibrillator leads are still in place.  Your physician recommends that you return for lab work in:  BMET  BNP

## 2014-03-05 NOTE — Assessment & Plan Note (Addendum)
Add diuretic given elevated BP and history of ischemic DCM

## 2014-03-05 NOTE — Assessment & Plan Note (Signed)
Interestingly VSD murmur is gone  She had mild break down of the patch edge in past  Echo to assess RV/LV EF  Shunt/VSD and estimated PA pressure given dyspnea

## 2014-03-05 NOTE — Assessment & Plan Note (Signed)
Etiology not clear She indicates weight gain but no obvious edema.  Known EF 30-35%  VSD murmur gone on exam CXR BNP and BMET today trial of low dose lasix 10 mg daily F/U Echo and see me next available

## 2014-03-05 NOTE — Progress Notes (Signed)
Patient ID: [name], female   DOB: 06/05/1925, 80 y.o.   MRN: [id number] [name] 80 yo who had delayed presentation of a large anterior MI on 07/07/11. Complicated by shock and VSD. Had CABG with SVG to Diagonal and LAD with VSD patch. Echo 4/16 reviewed and has EF 30-35% with small residual VSD. No thrombus. 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 Caused erosion of her gi mucosa and is now on sucralafate. She denies dyspnea, palpitations, chest pain. She has mild LE edema. She still cares for her husband with parkinsons at home. Daughter is very supportive.  Has F/U CT scheduled for thymoma.  Echo 4/13 reviewed  Study Conclusions  - Left ventricle: The cavity size was normal. Wall thickness was normal. Systolic function was moderately to severely reduced. The estimated ejection fraction was in the range of 30% to 35%. There is akinesis of the mid-distalanteroseptal and apical myocardium. - Mitral valve: Mild regurgitation. - Right ventricle: Systolic function was mildly reduced. - Pericardium, extracardiac: A trivial pericardial effusion was identified. Impressions:  - There appears to be a small VSD in the distal muscular septum.  More dyspnea last 6 months.  Referred by Dr Maudie Mercury  She does not really note more LE edema  No recent CXR.  Compliant with meds.  No cough sputum Her husband with parkinsons finally passed in December.  Her son who is a highly functioning schizophrenic lives with her and her daughter here today is also very attentive.      ROS: Denies fever, malais, weight loss, blurry vision, decreased visual acuity, cough, sputum, SOB, hemoptysis, pleuritic pain, palpitaitons, heartburn, abdominal pain, melena, lower extremity edema, claudication, or rash.  All other systems reviewed and negative  General: Affect appropriate Healthy:  appears stated age 80: normal Neck supple with no adenopathy JVP normal no bruits  no thyromegaly Lungs clear with no wheezing and good diaphragmatic motion Heart:  S1/S2 no murmur, no rub, gallop or click PMI normal Abdomen: benighn, BS positve, no tenderness, no AAA no bruit.  No HSM or HJR Distal pulses intact with no bruits No edema Neuro non-focal Skin warm and dry No muscular weakness   Current Outpatient Prescriptions  Medication Sig Dispense Refill  . aspirin EC 81 MG tablet Take 81 mg by mouth daily.      . calcium carbonate (TUMS - DOSED IN MG ELEMENTAL CALCIUM) 500 MG chewable tablet Chew 1 tablet by mouth.      . levothyroxine (SYNTHROID, LEVOTHROID) 25 MCG tablet Take 25 mcg by mouth daily before breakfast.      . metFORMIN (GLUCOPHAGE) 500 MG tablet Take 500 mg by mouth daily with breakfast.       . Naproxen Sod-Diphenhydramine (ALEVE PM PO) Take by mouth.      . olmesartan (BENICAR) 40 MG tablet Take 20 mg by mouth daily.       . simvastatin (ZOCOR) 40 MG tablet Take 40 mg by mouth every evening.      . carvedilol (COREG) 6.25 MG tablet Take 1 tablet (6.25 mg total) by mouth 2 (two) times daily with a meal.  180 tablet  3  . levothyroxine (LEVOTHROID) 25 MCG tablet Take 1 tablet (25 mcg total) by mouth daily.  30 tablet  11  . [DISCONTINUED] pantoprazole (PROTONIX) 40 MG tablet Take 1 tablet (40 mg total) by mouth daily at 12 noon.      . [DISCONTINUED] ramipril (ALTACE) 5 MG  capsule Take 1 capsule (5 mg total) by mouth daily.  30 capsule  1   No current facility-administered medications for this visit.    Allergies  Amoxapine and related  Electrocardiogram: 08/2012 SR low voltage ? Old anterior MI  Today SR rate 68 LAE old anterior MI  No change   Assessment and Plan

## 2014-03-05 NOTE — Assessment & Plan Note (Signed)
Stable with no angina and good activity level.  Continue medical Rx  

## 2014-03-06 ENCOUNTER — Ambulatory Visit (INDEPENDENT_AMBULATORY_CARE_PROVIDER_SITE_OTHER)
Admission: RE | Admit: 2014-03-06 | Discharge: 2014-03-06 | Disposition: A | Discharge status: Home / Self Care | Payer: Medicare Other | Source: Ambulatory Visit | Attending: Cardiovascular Disease | Admitting: Cardiovascular Disease

## 2014-03-06 DIAGNOSIS — I255 Ischemic cardiomyopathy: Secondary | ICD-10-CM

## 2014-03-06 DIAGNOSIS — R0600 Dyspnea, unspecified: Secondary | ICD-10-CM

## 2014-03-06 LAB — BASIC METABOLIC PANEL
BUN: 24 mg/dL — AB (ref 6–23)
CHLORIDE: 109 meq/L (ref 96–112)
CO2: 24 meq/L (ref 19–32)
CREATININE: 1.5 mg/dL — AB (ref 0.4–1.2)
Calcium: 9.7 mg/dL (ref 8.4–10.5)
GFR: 36.29 mL/min — ABNORMAL LOW (ref >60.00)
GLUCOSE: 105 mg/dL — AB (ref 70–99)
POTASSIUM: 4.4 meq/L (ref 3.5–5.1)
Sodium: 139 mEq/L (ref 135–145)

## 2014-03-06 LAB — BRAIN NATRIURETIC PEPTIDE: Pro B Natriuretic peptide (BNP): 182 pg/mL — ABNORMAL HIGH (ref 0.0–100.0)

## 2014-03-19 ENCOUNTER — Ambulatory Visit (HOSPITAL_COMMUNITY): Payer: Medicare Other | Attending: Cardiovascular Disease | Admitting: Radiology

## 2014-03-19 DIAGNOSIS — I255 Ischemic cardiomyopathy: Secondary | ICD-10-CM | POA: Insufficient documentation

## 2014-03-19 DIAGNOSIS — R0600 Dyspnea, unspecified: Secondary | ICD-10-CM | POA: Diagnosis present

## 2014-03-19 DIAGNOSIS — Q210 Ventricular septal defect: Secondary | ICD-10-CM

## 2014-03-19 NOTE — Progress Notes (Signed)
Echocardiogram performed.  

## 2014-04-28 ENCOUNTER — Encounter: Payer: Self-pay | Admitting: Cardiovascular Disease

## 2014-04-28 ENCOUNTER — Ambulatory Visit (INDEPENDENT_AMBULATORY_CARE_PROVIDER_SITE_OTHER): Payer: Medicare Other | Admitting: Cardiovascular Disease

## 2014-04-28 VITALS — BP 105/65 | HR 54 | Ht 62.0 in | Wt 156.4 lb

## 2014-04-28 DIAGNOSIS — I2583 Coronary atherosclerosis due to lipid rich plaque: Secondary | ICD-10-CM

## 2014-04-28 DIAGNOSIS — I5022 Chronic systolic (congestive) heart failure: Secondary | ICD-10-CM

## 2014-04-28 DIAGNOSIS — I2510 Atherosclerotic heart disease of native coronary artery without angina pectoris: Secondary | ICD-10-CM

## 2014-04-28 DIAGNOSIS — Q210 Ventricular septal defect: Secondary | ICD-10-CM

## 2014-04-28 DIAGNOSIS — I10 Essential (primary) hypertension: Secondary | ICD-10-CM

## 2014-04-28 DIAGNOSIS — I255 Ischemic cardiomyopathy: Secondary | ICD-10-CM

## 2014-04-28 NOTE — Assessment & Plan Note (Signed)
Stable with no angina and good activity level.  Continue medical Rx  

## 2014-04-28 NOTE — Assessment & Plan Note (Signed)
Euvolemic continue current dose ARB and diuretic  BNP low

## 2014-04-28 NOTE — Assessment & Plan Note (Signed)
Well controlled.  Continue current medications and low sodium Dash type diet.    

## 2014-04-28 NOTE — Patient Instructions (Signed)
Your physician wants you to follow-up in:  6 MONTHS WITH DR NISHAN  You will receive a reminder letter in the mail two months in advance. If you don't receive a letter, please call our office to schedule the follow-up appointment. Your physician recommends that you continue on your current medications as directed. Please refer to the Current Medication list given to you today. 

## 2014-04-28 NOTE — Progress Notes (Signed)
Patient ID: [name], female   DOB: [birth year], 80 y.o.   MRN: [id number] [name] 80 yo who had delayed presentation of a large anterior MI on 07/07/11. Complicated by shock and VSD. Had CABG with SVG to Diagonal and LAD with VSD patch. Echo 4/16 reviewed and has EF 30-35% with small residual VSD. No thrombus. 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 Caused erosion of her gi mucosa and is now on sucralafate. She denies dyspnea, palpitations, chest pain. She has mild LE edema. She still cares for her husband with parkinsons at home. Daughter is very supportive.  Has F/U CT scheduled for thymoma.  Echo 10/15 reviewed  Study Conclusions  - Left ventricle: The cavity size was normal. There was mild focal basal hypertrophy of the septum. Systolic function was moderately to severely reduced. The estimated ejection fraction was in the range of 30% to 35%. There is akinesis of the mid-apicalanteroseptal and apical myocardium. Doppler parameters are consistent with abnormal left ventricular relaxation (grade 1 diastolic dysfunction). - Aortic valve: There was mild regurgitation. - Mitral valve: There was mild regurgitation.  Impressions:  - Extremely limited due to poor sound wave transmission; LV function difficult to quantitate as endocardium not well visualized; there appears to be anteroseptal and apical akinesis with significantly reduced LV function (?EF 30-35); grade 1 diastolic dysfunction; mild AI and MR. Compared to 09/19/11, distal muscular VSD is not well interrogated and not visualized; suggest TEE or cardiac MRI to further assess if clinically indicated.  Dyspnea improved   She does not really note more LE edema No recent CXR. Compliant with meds. No cough sputum Her husband with parkinsons finally passed in December. Her son who is a highly functioning schizophrenic lives with her and her daughter  here today is also very attentive.   03/05/14  BNP only 182  and Cr 1.4      ROS: Denies fever, malais, weight loss, blurry vision, decreased visual acuity, cough, sputum, SOB, hemoptysis, pleuritic pain, palpitaitons, heartburn, abdominal pain, melena, lower extremity edema, claudication, or rash.  All other systems reviewed and negative  General: Affect appropriate Healthy:  appears stated age 42: normal Neck supple with no adenopathy JVP normal no bruits no thyromegaly Lungs clear with no wheezing and good diaphragmatic motion Heart:  S1/S2 SEM no VSD  murmur, no rub, gallop or click PMI normal Abdomen: benighn, BS positve, no tenderness, no AAA no bruit.  No HSM or HJR Distal pulses intact with no bruits No edema Neuro non-focal Skin warm and dry No muscular weakness   Current Outpatient Prescriptions  Medication Sig Dispense Refill  . aspirin EC 81 MG tablet Take 81 mg by mouth daily.    . calcium carbonate (TUMS - DOSED IN MG ELEMENTAL CALCIUM) 500 MG chewable tablet Chew 1 tablet by mouth.    . carvedilol (COREG) 6.25 MG tablet Take 1 tablet (6.25 mg total) by mouth 2 (two) times daily with a meal. 180 tablet 3  . furosemide (LASIX) 20 MG tablet Take 0.5 tablets (10 mg total) by mouth daily. 45 tablet 3  . levothyroxine (LEVOTHROID) 25 MCG tablet Take 1 tablet (25 mcg total) by mouth daily. 30 tablet 11  . levothyroxine (SYNTHROID, LEVOTHROID) 25 MCG tablet Take 25 mcg by mouth daily before breakfast.    . metFORMIN (GLUCOPHAGE) 500 MG tablet Take 500 mg by mouth daily with breakfast.     . Naproxen Sod-Diphenhydramine (ALEVE PM PO)  Take by mouth.    . olmesartan (BENICAR) 40 MG tablet Take 20 mg by mouth daily.     . simvastatin (ZOCOR) 40 MG tablet Take 40 mg by mouth every evening.    . [DISCONTINUED] pantoprazole (PROTONIX) 40 MG tablet Take 1 tablet (40 mg total) by mouth daily at 12 noon.    . [DISCONTINUED] ramipril (ALTACE) 5 MG capsule Take 1 capsule (5 mg  total) by mouth daily. 30 capsule 1   No current facility-administered medications for this visit.    Allergies  Amoxapine and related  Electrocardiogram:  03/05/14  SR rate 83  PR 240  Old anterolateral MI   Assessment and Plan

## 2014-04-28 NOTE — Assessment & Plan Note (Signed)
Post surgical repair No murmur and no obvious patch breakdown on echo

## 2014-04-28 NOTE — Assessment & Plan Note (Signed)
Stable no active chest pain or CHF  Not a candidate for AICD given age

## 2014-05-14 ENCOUNTER — Encounter (HOSPITAL_COMMUNITY): Payer: Self-pay | Admitting: Cardiovascular Disease

## 2014-09-02 ENCOUNTER — Other Ambulatory Visit: Payer: Self-pay | Admitting: Dermatology

## 2014-10-27 NOTE — Progress Notes (Signed)
Patient ID: [name], female   DOB: 1924/12/21, 81 y.o.   MRN: [id number] [name] 81 y.o.  who had delayed presentation of a large anterior MI on 07/07/11. Complicated by shock and VSD. Had CABG with SVG to Diagonal and LAD with VSD patch. Echo 4/16 reviewed and has EF 30-35% with small residual VSD. No thrombus. 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 Caused erosion of her gi mucosa and is now on sucralafate. She denies dyspnea, palpitations, chest pain. She has mild LE edema.   Husband died last year.   Daughter is very supportive. She still has her license and drives   Echo 44/62 reviewed  Study Conclusions  - Left ventricle: The cavity size was normal. There was mild focal basal hypertrophy of the septum. Systolic function was moderately to severely reduced. The estimated ejection fraction was in the range of 30% to 35%. There is akinesis of the mid-apicalanteroseptal and apical myocardium. Doppler parameters are consistent with abnormal left ventricular relaxation (grade 1 diastolic dysfunction). - Aortic valve: There was mild regurgitation. - Mitral valve: There was mild regurgitation.  Impressions:  - Extremely limited due to poor sound wave transmission; LV function difficult to quantitate as endocardium not well visualized; there appears to be anteroseptal and apical akinesis with significantly reduced LV function (?EF 30-35); grade 1 diastolic dysfunction; mild AI and MR. Compared to 09/19/11, distal muscular VSD is not well interrogated and not visualized; suggest TEE or cardiac MRI to further assess if clinically indicated.  Dyspnea improved   She does not really note more LE edema No recent CXR. Compliant with meds. No cough sputum Her husband with parkinsons finally passed in December. Her son who is a highly functioning schizophrenic lives with her and her daughter here today is also  very attentive.   03/05/14  BNP only 182  and Cr 1.4    ROS: Denies fever, malais, weight loss, blurry vision, decreased visual acuity, cough, sputum, SOB, hemoptysis, pleuritic pain, palpitaitons, heartburn, abdominal pain, melena, lower extremity edema, claudication, or rash.  All other systems reviewed and negative  General: Affect appropriate Healthy:  appears stated age 23: normal Neck supple with no adenopathy JVP normal no bruits no thyromegaly Lungs clear with no wheezing and good diaphragmatic motion Heart:  S1/S2 SEM no VSD  murmur, no rub, gallop or click PMI normal Abdomen: benighn, BS positve, no tenderness, no AAA no bruit.  No HSM or HJR Distal pulses intact with no bruits No edema Neuro non-focal Skin warm and dry No muscular weakness   Current Outpatient Prescriptions  Medication Sig Dispense Refill  . aspirin EC 81 MG tablet Take 81 mg by mouth daily.    . calcium carbonate (TUMS - DOSED IN MG ELEMENTAL CALCIUM) 500 MG chewable tablet Chew 1 tablet by mouth.    . carvedilol (COREG) 6.25 MG tablet Take 1 tablet (6.25 mg total) by mouth 2 (two) times daily with a meal. 180 tablet 3  . furosemide (LASIX) 20 MG tablet Take 0.5 tablets (10 mg total) by mouth daily. 45 tablet 3  . levothyroxine (SYNTHROID, LEVOTHROID) 25 MCG tablet Take 25 mcg by mouth daily before breakfast.    . losartan (COZAAR) 100 MG tablet Take 100 mg by mouth daily.  2  . metFORMIN (GLUCOPHAGE) 500 MG tablet Take 500 mg by mouth daily with breakfast.     . Naproxen Sod-Diphenhydramine (ALEVE PM PO) Take by mouth.    Marland Kitchen  simvastatin (ZOCOR) 40 MG tablet Take 40 mg by mouth every evening.    . [DISCONTINUED] pantoprazole (PROTONIX) 40 MG tablet Take 1 tablet (40 mg total) by mouth daily at 12 noon.    . [DISCONTINUED] ramipril (ALTACE) 5 MG capsule Take 1 capsule (5 mg total) by mouth daily. 30 capsule 1   No current facility-administered medications for this visit.    Allergies  Amoxapine  and related  Electrocardiogram:  03/05/14  SR rate 83  PR 240  Old anterolateral MI   Assessment and Plan CAD:  Stable with no angina and good activity level.  Continue medical Rx VSD:  Patch closure at time of CABG  07/2011  Echo with intact patch Thymoma:  In remission post XRT no palpable neck masses Chol:   Cholesterol is at goal.  Continue current dose of statin and diet Rx.  No myalgias or side effects.  F/U  LFT's in 6 months. Lab Results  Component Value Date   LDLCALC 15 07/08/2011   Edema:  Dependant venous continue low dose lasix CHF:  Euvolemic EF 35%  No AICD given age continue beta blocker, Ace and diuretic    Jenkins Rouge

## 2014-10-28 ENCOUNTER — Ambulatory Visit (INDEPENDENT_AMBULATORY_CARE_PROVIDER_SITE_OTHER): Payer: Medicare Other | Admitting: Cardiovascular Disease

## 2014-10-28 ENCOUNTER — Encounter: Payer: Self-pay | Admitting: Cardiovascular Disease

## 2014-10-28 VITALS — BP 100/60 | HR 79 | Ht 62.0 in | Wt 160.6 lb

## 2014-10-28 DIAGNOSIS — I2510 Atherosclerotic heart disease of native coronary artery without angina pectoris: Secondary | ICD-10-CM | POA: Diagnosis not present

## 2014-10-28 DIAGNOSIS — I2583 Coronary atherosclerosis due to lipid rich plaque: Principal | ICD-10-CM

## 2014-10-28 NOTE — Patient Instructions (Signed)
Medication Instructions:  NO CHANGES  Labwork: NONE  Testing/Procedures: NONE  Follow-Up: Your physician wants you to follow-up in: 6 MO F/U  WITH DR Elmer will receive a reminder letter in the mail two months in advance. If you don't receive a letter, please call our office to schedule the follow-up appointment.  Any Other Special Instructions Will Be Listed Below (If Applicable).

## 2015-01-18 ENCOUNTER — Other Ambulatory Visit: Payer: Self-pay | Admitting: Cardiovascular Disease

## 2015-03-22 ENCOUNTER — Other Ambulatory Visit: Payer: Self-pay | Admitting: Neurological Surgery

## 2015-03-22 DIAGNOSIS — M5416 Radiculopathy, lumbar region: Secondary | ICD-10-CM

## 2015-03-25 ENCOUNTER — Ambulatory Visit
Admission: RE | Admit: 2015-03-25 | Discharge: 2015-03-25 | Disposition: A | Discharge status: Home / Self Care | Payer: Medicare Other | Source: Ambulatory Visit | Attending: Neurological Surgery | Admitting: Neurological Surgery

## 2015-03-25 DIAGNOSIS — M5416 Radiculopathy, lumbar region: Secondary | ICD-10-CM

## 2015-04-24 NOTE — Progress Notes (Signed)
Patient ID: [name], female   DOB: October 02, 1924, 81 y.o.   MRN: [id number] [name] 81 y.o.  who had delayed presentation of a large anterior MI on 07/07/11. Complicated by shock and VSD. Had CABG with SVG to Diagonal and LAD with VSD patch. Echo 4/16 reviewed and has EF 30-35% with small residual VSD. No thrombus. 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 Caused erosion of her gi mucosa and is now on sucralafate. She denies dyspnea, palpitations, chest pain. She has mild LE edema.   Husband died 2 years ago.   Daughter is very supportive. She still has her license and drives   Echo [id number] reviewed  Study Conclusions  - Left ventricle: The cavity size was normal. There was mild focal basal hypertrophy of the septum. Systolic function was moderately to severely reduced. The estimated ejection fraction was in the range of 30% to 35%. There is akinesis of the mid-apicalanteroseptal and apical myocardium. Doppler parameters are consistent with abnormal left ventricular relaxation (grade 1 diastolic dysfunction). - Aortic valve: There was mild regurgitation. - Mitral valve: There was mild regurgitation.  Impressions:  - Extremely limited due to poor sound wave transmission; LV function difficult to quantitate as endocardium not well visualized; there appears to be anteroseptal and apical akinesis with significantly reduced LV function (?EF 30-35); grade 1 diastolic dysfunction; mild AI and MR. Compared to 09/19/11, distal muscular VSD is not well interrogated and not visualized; suggest TEE or cardiac MRI to further assess if clinically indicated.  Dyspnea improved   She does not really note more LE edema No recent CXR. Compliant with meds. No cough sputum Her husband with parkinsons finally passed in December. Her son who is a highly functioning schizophrenic lives with her and her daughter here today is also  very attentive.   03/05/14  BNP only 182  and Cr 1.4    Has a disc problem in back getting shots from [name]: Denies fever, malais, weight loss, blurry vision, decreased visual acuity, cough, sputum, SOB, hemoptysis, pleuritic pain, palpitaitons, heartburn, abdominal pain, melena, lower extremity edema, claudication, or rash.  All other systems reviewed and negative  General: Affect appropriate Healthy:  appears stated age 81: normal Neck supple with no adenopathy JVP normal no bruits no thyromegaly Lungs clear with no wheezing and good diaphragmatic motion Heart:  S1/S2 SEM no VSD  murmur, no rub, gallop or click PMI normal Abdomen: benighn, BS positve, no tenderness, no AAA no bruit.  No HSM or HJR Distal pulses intact with no bruits No edema Neuro non-focal Skin warm and dry No muscular weakness   Current Outpatient Prescriptions  Medication Sig Dispense Refill  . aspirin EC 81 MG tablet Take 81 mg by mouth daily.    . calcium carbonate (TUMS - DOSED IN MG ELEMENTAL CALCIUM) 500 MG chewable tablet Chew 1 tablet by mouth daily as needed for indigestion or heartburn.     . furosemide (LASIX) 20 MG tablet TAKE 1/2 TABLET (10 MG TOTAL) BY MOUTH DAILY. 45 tablet 0  . levothyroxine (SYNTHROID, LEVOTHROID) 25 MCG tablet Take 25 mcg by mouth daily before breakfast.    . losartan (COZAAR) 100 MG tablet Take 100 mg by mouth daily.  2  . metFORMIN (GLUCOPHAGE) 500 MG tablet Take 500 mg by mouth daily with breakfast.     . Naproxen Sod-Diphenhydramine (ALEVE PM PO) Take 1 tablet by mouth daily as needed (pain &  sleep).     . simvastatin (ZOCOR) 40 MG tablet Take 40 mg by mouth every evening.    . carvedilol (COREG) 6.25 MG tablet Take 1 tablet (6.25 mg total) by mouth 2 (two) times daily with a meal. 180 tablet 3  . [DISCONTINUED] pantoprazole (PROTONIX) 40 MG tablet Take 1 tablet (40 mg total) by mouth daily at 12 noon.    . [DISCONTINUED] ramipril (ALTACE) 5 MG capsule Take 1  capsule (5 mg total) by mouth daily. 30 capsule 1   No current facility-administered medications for this visit.    Allergies  Amoxapine and related  Electrocardiogram:  03/05/14  SR rate 83  PR 240  Old anterolateral MI   04/26/15  SR rate 82  Old anterolateral MI   Assessment and Plan CAD:  Stable with no angina and good activity level.  Continue medical Rx VSD:  Patch closure at time of CABG  07/2011  Echo with intact patch Thymoma:  In remission post XRT no palpable neck masses Chol:   Cholesterol is at goal.  Continue current dose of statin and diet Rx.  No myalgias or side effects.  F/U  LFT's in 6 months. Lab Results  Component Value Date   LDLCALC 15 07/08/2011   Edema:  Dependant venous continue low dose lasix CHF:  Euvolemic EF 35%  No AICD given age continue beta blocker, Ace and diuretic Lumbago:  F/u [name] for steroid injection avoid surgery if possible   [name]

## 2015-04-26 ENCOUNTER — Encounter: Payer: Self-pay | Admitting: Cardiovascular Disease

## 2015-04-26 ENCOUNTER — Ambulatory Visit (INDEPENDENT_AMBULATORY_CARE_PROVIDER_SITE_OTHER): Payer: Medicare Other | Admitting: Cardiovascular Disease

## 2015-04-26 VITALS — BP 126/64 | HR 80 | Ht 61.0 in | Wt 151.8 lb

## 2015-04-26 DIAGNOSIS — I10 Essential (primary) hypertension: Secondary | ICD-10-CM | POA: Diagnosis not present

## 2015-04-26 DIAGNOSIS — I5022 Chronic systolic (congestive) heart failure: Secondary | ICD-10-CM | POA: Diagnosis not present

## 2015-04-26 MED ORDER — FUROSEMIDE 20 MG PO TABS: ORAL_TABLET | ORAL | Status: DC

## 2015-04-26 NOTE — Patient Instructions (Signed)

## 2015-08-11 ENCOUNTER — Telehealth: Payer: Self-pay | Admitting: Cardiovascular Disease

## 2015-08-11 NOTE — Telephone Encounter (Signed)
Surgical clearance faxed again to [facility]. Confirmed that fax was received by Lorriane Shire over at [facility] ([facility]). Called patient to let her know [facility] have a signed surgical clearance, and Lorriane Shire will be contacting her today. Patient verbalized understanding.

## 2015-08-11 NOTE — Telephone Encounter (Signed)
New message      Calling to check on the status for clearance for disk surgery from [facility].  Did we receive it?  Have we faxed it back?

## 2015-08-12 ENCOUNTER — Other Ambulatory Visit: Payer: Self-pay | Admitting: Neurological Surgery

## 2015-08-17 ENCOUNTER — Ambulatory Visit (HOSPITAL_COMMUNITY)
Admission: RE | Admit: 2015-08-17 | Discharge: 2015-08-17 | Disposition: A | Discharge status: Home / Self Care | Payer: Medicare Other | Source: Ambulatory Visit | Attending: Neurological Surgery | Admitting: Neurological Surgery

## 2015-08-17 ENCOUNTER — Encounter (HOSPITAL_COMMUNITY)
Admission: RE | Admit: 2015-08-17 | Discharge: 2015-08-17 | Disposition: A | Discharge status: Home / Self Care | Payer: Medicare Other | Source: Ambulatory Visit | Attending: Neurological Surgery | Admitting: Neurological Surgery

## 2015-08-17 DIAGNOSIS — Z01812 Encounter for preprocedural laboratory examination: Secondary | ICD-10-CM | POA: Diagnosis not present

## 2015-08-17 DIAGNOSIS — Z01818 Encounter for other preprocedural examination: Secondary | ICD-10-CM | POA: Diagnosis not present

## 2015-08-17 DIAGNOSIS — J90 Pleural effusion, not elsewhere classified: Secondary | ICD-10-CM | POA: Diagnosis not present

## 2015-08-17 DIAGNOSIS — IMO0002 Reserved for concepts with insufficient information to code with codable children: Secondary | ICD-10-CM

## 2015-08-17 DIAGNOSIS — M519 Unspecified thoracic, thoracolumbar and lumbosacral intervertebral disc disorder: Secondary | ICD-10-CM | POA: Diagnosis not present

## 2015-08-17 LAB — CBC WITH DIFFERENTIAL/PLATELET
Basophils Absolute: 0.1 10*3/uL (ref 0.0–0.1)
Basophils Relative: 1 %
EOS PCT: 7 %
Eosinophils Absolute: 0.7 10*3/uL (ref 0.0–0.7)
HCT: 40.4 % (ref 36.0–46.0)
Hemoglobin: 13.1 g/dL (ref 12.0–15.0)
LYMPHS ABS: 0.6 10*3/uL — AB (ref 0.7–4.0)
LYMPHS PCT: 6 %
MCH: 28.5 pg (ref 26.0–34.0)
MCHC: 32.4 g/dL (ref 30.0–36.0)
MCV: 87.8 fL (ref 78.0–100.0)
Monocytes Absolute: 0.8 10*3/uL (ref 0.1–1.0)
Monocytes Relative: 8 %
NEUTROS ABS: 7.7 10*3/uL (ref 1.7–7.7)
NEUTROS PCT: 78 %
PLATELETS: 286 10*3/uL (ref 150–400)
RBC: 4.6 MIL/uL (ref 3.87–5.11)
RDW: 14.4 % (ref 11.5–15.5)
WBC: 9.9 10*3/uL (ref 4.0–10.5)

## 2015-08-17 LAB — BASIC METABOLIC PANEL
ANION GAP: 10 (ref 5–15)
BUN: 15 mg/dL (ref 6–20)
CHLORIDE: 108 mmol/L (ref 101–111)
CO2: 22 mmol/L (ref 22–32)
Calcium: 10 mg/dL (ref 8.9–10.3)
Creatinine, Ser: 1.05 mg/dL — ABNORMAL HIGH (ref 0.44–1.00)
GFR calc non Af Amer: 46 mL/min — ABNORMAL LOW (ref >60)
GFR, EST AFRICAN AMERICAN: 53 mL/min — AB (ref >60)
Glucose, Bld: 139 mg/dL — ABNORMAL HIGH (ref 65–99)
POTASSIUM: 4.5 mmol/L (ref 3.5–5.1)
SODIUM: 140 mmol/L (ref 135–145)

## 2015-08-17 LAB — SURGICAL PCR SCREEN
MRSA, PCR: NEGATIVE
STAPHYLOCOCCUS AUREUS: NEGATIVE

## 2015-08-17 LAB — PROTIME-INR
INR: 1.13 (ref 0.00–1.49)
Prothrombin Time: 14.7 seconds (ref 11.6–15.2)

## 2015-08-17 NOTE — Pre-Procedure Instructions (Signed)
[name]  08/17/2015      [place], [address] [place] [address] Phone: [phone] Fax: [phone]  [facility] [place], [address] [place] [address] Phone: [phone] Fax: [phone]    Your procedure is scheduled on August 19, 2015.  Report to [facility] Admitting at 9:30 A.M.  Call this number if you have problems the morning of surgery:  [phone]   Remember:  Do not eat food or drink liquids after midnight.  Take these medicines the morning of surgery with A SIP OF WATER : carvedilol (COREG), levothyroxine (SYNTHROID, LEVOTHROID)   STOP ASPIRIN, HERBAL MEDICATION, NSAIDS (ALEVE, NAPROXEN) ONE WEEK PRIOR TO SURGERY   Do not wear jewelry, make-up or nail polish.  Do not wear lotions, powders, or perfumes.  You may wear deodorant.  Do not shave 48 hours prior to surgery.  Men may shave face and neck.  Do not bring valuables to the hospital.  [facility] is not responsible for any belongings or valuables.  Contacts, dentures or bridgework may not be worn into surgery.  Leave your suitcase in the car.  After surgery it may be brought to your room.  For patients admitted to the hospital, discharge time will be determined by your treatment team.  Patients discharged the day of surgery will not be allowed to drive home.   Name and phone number of your driver:    Special instructions:  "PREPARING FOR SURGERY"  Please read over the following fact sheets that you were given. Pain Booklet, Coughing and Deep Breathing and Surgical Site Infection Prevention         How to Manage Your Diabetes Before Surgery   Why is it important to control my blood sugar before and after surgery?   Improving blood sugar levels before and after surgery helps healing and can limit problems.  A way of improving blood sugar  control is eating a healthy diet by:  - Eating less sugar and carbohydrates  - Increasing activity/exercise  - Talk with your doctor about reaching your blood sugar goals  High blood sugars (greater than 180 mg/dL) can raise your risk of infections and slow down your recovery so you will need to focus on controlling your diabetes during the weeks before surgery.  Make sure that the doctor who takes care of your diabetes knows about your planned surgery including the date and location.  How do I manage my blood sugars before surgery?   Check your blood sugar at least 4 times a day, 2 days before surgery to make sure that they are not too high or low.   Check your blood sugar the morning of your surgery when you wake up and every 2               hours until you get to the Short-Stay unit.  If your blood sugar is less than 70 mg/dL, you will need to treat for low blood sugar by:  Treat a low blood sugar (less than 70 mg/dL) with 1/2 cup of clear juice (cranberry or apple), 4 glucose tablets, OR glucose gel.  Recheck blood sugar in 15 minutes after treatment (to make sure it is greater than 70 mg/dL).  If blood sugar is not greater than 70 mg/dL on re-check, call [phone] for further instructions.   Report your blood  sugar to the Short-Stay nurse when you get to Short-Stay.  References:  [facility], 2007 "How to Manage your Diabetes Before and After Surgery".  What do I do about my diabetes medications?   Do not take oral diabetes medicines (pills) the morning of surgery.

## 2015-08-18 LAB — HEMOGLOBIN A1C
Hgb A1c MFr Bld: 6.6 % — ABNORMAL HIGH (ref 4.8–5.6)
MEAN PLASMA GLUCOSE: 143 mg/dL

## 2015-08-18 MED ORDER — CEFAZOLIN SODIUM-DEXTROSE 2-3 GM-% IV SOLR
2.0000 g | INTRAVENOUS | Status: AC
  Administered 2015-08-19: 2 g via INTRAVENOUS
  Filled 2015-08-18: qty 50

## 2015-08-18 MED ORDER — DEXAMETHASONE SODIUM PHOSPHATE 10 MG/ML IJ SOLN
10.0000 mg | INTRAMUSCULAR | Status: DC
  Filled 2015-08-18: qty 1

## 2015-08-18 NOTE — Progress Notes (Signed)
Anesthesia chart review: Patient is an 82 year old female scheduled for left L4-5 microdiskectomy on 08/19/15 by Dr. Ronnald Ramp.  History includes out of hospital acute anterior MI with post infarct acute VSD s/p emergency CABG X 2 (sequential SVG to diagonal and LAD, and bovine pericardial patch repair of VSD) 07/07/11, an anterior mediastinal mass was noted at that time of her CABG and was resected with pathology showing thymoma and is now s/p radiation (completed 12/2011; Dr. Lisbeth Renshaw), ischemic cardiomyopathy, HTN, hypothyroidism, non-smoker. PCP is Dr. [name].   Cardiologist is Dr. Johnsie Cancel., last visit 04/26/15. At that time her CV status was felt stable with no angina and good activity level. Her dyspnea had improved. In previous notes (10/12/11), he wrote, "Despite her ischemic DCM, he did not think an AICD should be considered at that time due to her advanced age and thymoma treatment." On 08/06/15, he did sign a note of cardiac clearance for this procedure.   Meds include ASA 81mg , Coreg, Lasix, levothyroxine, losartan, metformin, simvastatin.  04/26/15 EKG ([facility]): SR,  non-specific IVCD, possible RVH, inferior infarct (age undetermined), anterolateral infarct (old). Rather diffuse ST abnormality (borderline ST elevation). Negative septal and high lateral T waves. ST abnormality appears more pronounced when compared to 20-Mar-2014 tracing.   03/19/14 Echo: Study Conclusions - Left ventricle: The cavity size was normal. There was mild focal basal hypertrophy of the septum. Systolic function was moderately to severely reduced. The estimated ejection fraction was in the range of 30% to 35%. There is akinesis of the mid-apicalanteroseptal and apical myocardium. Doppler parameters are consistent with abnormal left ventricular relaxation (grade 1 diastolic dysfunction). - Aortic valve: There was mild regurgitation. - Mitral valve: There was mild regurgitation. Impressions: - Extremely  limited due to poor sound wave transmission; LV function difficult to quantitate as endocardium not well visualized; there appears to be anteroseptal and apical akinesis with significantly reduced LV function (?EF 30-35); grade 1 diastolic dysfunction; mild AI and MR. Compared to 09/19/11, distal muscular VSD is not well interrogated and not visualized; suggest TEE or cardiac MRI to further assess if clinically indicated. (Dr. Johnsie Cancel reviewed and wrote, "EF about same as MI and VSD appears closed Patch intact." Comparison 09/29/11 echo showed LVEF 30-35%, akinesis of the mid-distalanteroseptal and apical myocardium, question of small VSD in the distal muscular septum.)  07/07/11 Cardiac cath (prior to CABG) : Coronary dominance: right. Left mainstem: Normal. Left anterior descending (LAD): 100% occluded in mid vessel. D1: 80% proximal. Left circumflex (LCx): normal. Right coronary artery (RCA): 40% distal and 60% PDA. Left ventriculography: Anterior and apical dyskinesis. Basal hyperdynamic function. Overall EF 35%. Large VSD with dye entering the RV.  08/17/15 CXR: FINDINGS: Prior CABG. Small right pleural effusion. Lungs are clear. Heart is normal size. No acute bony abnormality. IMPRESSION: Small right pleural effusion.  Preoperative labs noted. Cr 1.05. Glucose 139. A1c 6.6. H/H WNL.  Patient with significant cardiac history but with recent cardiology evaluation. Dr. Johnsie Cancel has also cleared her for upcoming surgery. EF 30-35% by echo in 03/2014. If no acute CV/CHF symptoms then I would anticipate that she could proceed as planned. Further evaluation by her anesthesiologist and surgeon on the day of surgery.  [facility] Short Stay Center/Anesthesiology Phone [phone] 08/18/2015 10:44 AM

## 2015-08-19 ENCOUNTER — Ambulatory Visit (HOSPITAL_COMMUNITY)
Admission: RE | Admit: 2015-08-19 | Discharge: 2015-08-20 | Disposition: A | Discharge status: Home / Self Care | Payer: Medicare Other | Source: Ambulatory Visit | Attending: Neurological Surgery | Admitting: Neurological Surgery

## 2015-08-19 ENCOUNTER — Inpatient Hospital Stay (HOSPITAL_COMMUNITY): Payer: Medicare Other | Admitting: Vascular Surgery

## 2015-08-19 ENCOUNTER — Encounter (HOSPITAL_COMMUNITY)
Admission: RE | Disposition: A | Discharge status: Home / Self Care | Payer: Self-pay | Source: Ambulatory Visit | Attending: Neurological Surgery

## 2015-08-19 ENCOUNTER — Encounter (HOSPITAL_COMMUNITY): Payer: Self-pay | Admitting: Surgery

## 2015-08-19 ENCOUNTER — Inpatient Hospital Stay (HOSPITAL_COMMUNITY): Payer: Medicare Other

## 2015-08-19 ENCOUNTER — Inpatient Hospital Stay (HOSPITAL_COMMUNITY): Payer: Medicare Other | Admitting: Certified Registered Nurse Anesthetist

## 2015-08-19 DIAGNOSIS — M4806 Spinal stenosis, lumbar region: Secondary | ICD-10-CM | POA: Insufficient documentation

## 2015-08-19 DIAGNOSIS — Z7984 Long term (current) use of oral hypoglycemic drugs: Secondary | ICD-10-CM | POA: Diagnosis not present

## 2015-08-19 DIAGNOSIS — M5126 Other intervertebral disc displacement, lumbar region: Secondary | ICD-10-CM | POA: Diagnosis not present

## 2015-08-19 DIAGNOSIS — M5416 Radiculopathy, lumbar region: Secondary | ICD-10-CM | POA: Insufficient documentation

## 2015-08-19 DIAGNOSIS — I509 Heart failure, unspecified: Secondary | ICD-10-CM | POA: Insufficient documentation

## 2015-08-19 DIAGNOSIS — Z923 Personal history of irradiation: Secondary | ICD-10-CM | POA: Diagnosis not present

## 2015-08-19 DIAGNOSIS — E039 Hypothyroidism, unspecified: Secondary | ICD-10-CM | POA: Insufficient documentation

## 2015-08-19 DIAGNOSIS — M170 Bilateral primary osteoarthritis of knee: Secondary | ICD-10-CM | POA: Insufficient documentation

## 2015-08-19 DIAGNOSIS — I255 Ischemic cardiomyopathy: Secondary | ICD-10-CM | POA: Insufficient documentation

## 2015-08-19 DIAGNOSIS — Z951 Presence of aortocoronary bypass graft: Secondary | ICD-10-CM | POA: Diagnosis not present

## 2015-08-19 DIAGNOSIS — I2510 Atherosclerotic heart disease of native coronary artery without angina pectoris: Secondary | ICD-10-CM | POA: Insufficient documentation

## 2015-08-19 DIAGNOSIS — I110 Hypertensive heart disease with heart failure: Secondary | ICD-10-CM | POA: Diagnosis not present

## 2015-08-19 DIAGNOSIS — Z888 Allergy status to other drugs, medicaments and biological substances status: Secondary | ICD-10-CM | POA: Insufficient documentation

## 2015-08-19 DIAGNOSIS — Z8529 Personal history of malignant neoplasm of other respiratory and intrathoracic organs: Secondary | ICD-10-CM | POA: Diagnosis not present

## 2015-08-19 DIAGNOSIS — M4316 Spondylolisthesis, lumbar region: Secondary | ICD-10-CM | POA: Diagnosis not present

## 2015-08-19 DIAGNOSIS — Z7982 Long term (current) use of aspirin: Secondary | ICD-10-CM | POA: Insufficient documentation

## 2015-08-19 DIAGNOSIS — Z9889 Other specified postprocedural states: Secondary | ICD-10-CM

## 2015-08-19 DIAGNOSIS — Z90710 Acquired absence of both cervix and uterus: Secondary | ICD-10-CM | POA: Diagnosis not present

## 2015-08-19 DIAGNOSIS — Q210 Ventricular septal defect: Secondary | ICD-10-CM | POA: Insufficient documentation

## 2015-08-19 DIAGNOSIS — I252 Old myocardial infarction: Secondary | ICD-10-CM | POA: Diagnosis not present

## 2015-08-19 HISTORY — PX: LUMBAR LAMINECTOMY/DECOMPRESSION MICRODISCECTOMY: SHX5026

## 2015-08-19 LAB — GLUCOSE, CAPILLARY: Glucose-Capillary: 135 mg/dL — ABNORMAL HIGH (ref 65–99)

## 2015-08-19 SURGERY — LUMBAR LAMINECTOMY/DECOMPRESSION MICRODISCECTOMY 1 LEVEL
Anesthesia: General | Site: Spine Lumbar | Laterality: Left

## 2015-08-19 MED ORDER — MENTHOL 3 MG MT LOZG: 1.0000 | LOZENGE | OROMUCOSAL | Status: DC | PRN

## 2015-08-19 MED ORDER — PHENYLEPHRINE HCL 10 MG/ML IJ SOLN
10.0000 mg | INTRAVENOUS | Status: DC | PRN
  Administered 2015-08-19: 10 ug/min via INTRAVENOUS

## 2015-08-19 MED ORDER — ROCURONIUM BROMIDE 50 MG/5ML IV SOLN
INTRAVENOUS | Status: AC
  Filled 2015-08-19: qty 1

## 2015-08-19 MED ORDER — NEOSTIGMINE METHYLSULFATE 10 MG/10ML IV SOLN
INTRAVENOUS | Status: AC
  Filled 2015-08-19: qty 1

## 2015-08-19 MED ORDER — OXYCODONE-ACETAMINOPHEN 5-325 MG PO TABS
1.0000 | ORAL_TABLET | ORAL | Status: DC | PRN
  Administered 2015-08-19 – 2015-08-20 (×2): 1 via ORAL
  Filled 2015-08-19 (×2): qty 1

## 2015-08-19 MED ORDER — ONDANSETRON HCL 4 MG/2ML IJ SOLN
INTRAMUSCULAR | Status: DC | PRN
  Administered 2015-08-19: 4 mg via INTRAVENOUS

## 2015-08-19 MED ORDER — SODIUM CHLORIDE 0.9 % IR SOLN
Status: DC | PRN
  Administered 2015-08-19: 500 mL

## 2015-08-19 MED ORDER — PHENOL 1.4 % MT LIQD: 1.0000 | OROMUCOSAL | Status: DC | PRN

## 2015-08-19 MED ORDER — GLYCOPYRROLATE 0.2 MG/ML IJ SOLN
INTRAMUSCULAR | Status: AC
  Filled 2015-08-19: qty 2

## 2015-08-19 MED ORDER — PROPOFOL 10 MG/ML IV BOLUS
INTRAVENOUS | Status: DC | PRN
  Administered 2015-08-19: 60 mg via INTRAVENOUS

## 2015-08-19 MED ORDER — LACTATED RINGERS IV SOLN
INTRAVENOUS | Status: DC
  Administered 2015-08-19: 10:00:00 via INTRAVENOUS

## 2015-08-19 MED ORDER — SUGAMMADEX SODIUM 200 MG/2ML IV SOLN
INTRAVENOUS | Status: DC | PRN
  Administered 2015-08-19: 150 mg via INTRAVENOUS

## 2015-08-19 MED ORDER — PHENYLEPHRINE 40 MCG/ML (10ML) SYRINGE FOR IV PUSH (FOR BLOOD PRESSURE SUPPORT)
PREFILLED_SYRINGE | INTRAVENOUS | Status: AC
  Filled 2015-08-19: qty 10

## 2015-08-19 MED ORDER — 0.9 % SODIUM CHLORIDE (POUR BTL) OPTIME
TOPICAL | Status: DC | PRN
  Administered 2015-08-19: 1000 mL

## 2015-08-19 MED ORDER — THROMBIN 5000 UNITS EX SOLR
CUTANEOUS | Status: DC | PRN
  Administered 2015-08-19 (×2): 5000 [IU] via TOPICAL

## 2015-08-19 MED ORDER — DIPHENHYDRAMINE HCL 25 MG PO CAPS
25.0000 mg | ORAL_CAPSULE | Freq: Four times a day (QID) | ORAL | Status: DC | PRN
  Administered 2015-08-19 – 2015-08-20 (×2): 25 mg via ORAL
  Filled 2015-08-19 (×2): qty 1

## 2015-08-19 MED ORDER — ROCURONIUM BROMIDE 100 MG/10ML IV SOLN
INTRAVENOUS | Status: DC | PRN
  Administered 2015-08-19: 50 mg via INTRAVENOUS

## 2015-08-19 MED ORDER — ONDANSETRON HCL 4 MG/2ML IJ SOLN
INTRAMUSCULAR | Status: AC
  Filled 2015-08-19: qty 2

## 2015-08-19 MED ORDER — LOSARTAN POTASSIUM 50 MG PO TABS: 100.0000 mg | ORAL_TABLET | Freq: Every day | ORAL | Status: DC

## 2015-08-19 MED ORDER — FENTANYL CITRATE (PF) 100 MCG/2ML IJ SOLN
25.0000 ug | INTRAMUSCULAR | Status: DC | PRN
  Administered 2015-08-19: 25 ug via INTRAVENOUS

## 2015-08-19 MED ORDER — ACETAMINOPHEN 650 MG RE SUPP: 650.0000 mg | RECTAL | Status: DC | PRN

## 2015-08-19 MED ORDER — MORPHINE SULFATE (PF) 2 MG/ML IV SOLN: 1.0000 mg | INTRAVENOUS | Status: DC | PRN

## 2015-08-19 MED ORDER — LIDOCAINE HCL (CARDIAC) 20 MG/ML IV SOLN
INTRAVENOUS | Status: AC
  Filled 2015-08-19: qty 5

## 2015-08-19 MED ORDER — ESMOLOL HCL 100 MG/10ML IV SOLN
INTRAVENOUS | Status: DC | PRN
  Administered 2015-08-19: 20 mg via INTRAVENOUS
  Administered 2015-08-19: 10 mg via INTRAVENOUS
  Administered 2015-08-19: 20 mg via INTRAVENOUS

## 2015-08-19 MED ORDER — PHENYLEPHRINE HCL 10 MG/ML IJ SOLN
INTRAMUSCULAR | Status: DC | PRN
  Administered 2015-08-19 (×2): 40 ug via INTRAVENOUS

## 2015-08-19 MED ORDER — PROPOFOL 10 MG/ML IV BOLUS
INTRAVENOUS | Status: AC
  Filled 2015-08-19: qty 20

## 2015-08-19 MED ORDER — SODIUM CHLORIDE 0.9% FLUSH
3.0000 mL | INTRAVENOUS | Status: DC | PRN
Start: 2015-08-19 — End: 2015-08-20

## 2015-08-19 MED ORDER — FUROSEMIDE 20 MG PO TABS: 20.0000 mg | ORAL_TABLET | Freq: Every day | ORAL | Status: DC

## 2015-08-19 MED ORDER — FENTANYL CITRATE (PF) 100 MCG/2ML IJ SOLN
INTRAMUSCULAR | Status: DC | PRN
  Administered 2015-08-19 (×2): 100 ug via INTRAVENOUS

## 2015-08-19 MED ORDER — METFORMIN HCL 500 MG PO TABS: 500.0000 mg | ORAL_TABLET | Freq: Every day | ORAL | Status: DC

## 2015-08-19 MED ORDER — POTASSIUM CHLORIDE IN NACL 20-0.9 MEQ/L-% IV SOLN
INTRAVENOUS | Status: DC
  Filled 2015-08-19 (×3): qty 1000

## 2015-08-19 MED ORDER — ASPIRIN EC 81 MG PO TBEC: 81.0000 mg | DELAYED_RELEASE_TABLET | Freq: Every day | ORAL | Status: DC

## 2015-08-19 MED ORDER — SODIUM CHLORIDE 0.9% FLUSH
3.0000 mL | Freq: Two times a day (BID) | INTRAVENOUS | Status: DC
  Administered 2015-08-19: 3 mL via INTRAVENOUS

## 2015-08-19 MED ORDER — CARVEDILOL 6.25 MG PO TABS: 6.2500 mg | ORAL_TABLET | Freq: Two times a day (BID) | ORAL | Status: DC

## 2015-08-19 MED ORDER — LEVOTHYROXINE SODIUM 100 MCG PO TABS
50.0000 ug | ORAL_TABLET | Freq: Every day | ORAL | Status: DC
  Administered 2015-08-20: 50 ug via ORAL
  Filled 2015-08-19: qty 1

## 2015-08-19 MED ORDER — FENTANYL CITRATE (PF) 100 MCG/2ML IJ SOLN
INTRAMUSCULAR | Status: AC
  Filled 2015-08-19: qty 2

## 2015-08-19 MED ORDER — THROMBIN 5000 UNITS EX SOLR
CUTANEOUS | Status: DC | PRN
  Administered 2015-08-19: 5 mL via TOPICAL

## 2015-08-19 MED ORDER — LIDOCAINE HCL (CARDIAC) 20 MG/ML IV SOLN
INTRAVENOUS | Status: DC | PRN
  Administered 2015-08-19: 40 mg via INTRAVENOUS

## 2015-08-19 MED ORDER — HEMOSTATIC AGENTS (NO CHARGE) OPTIME
TOPICAL | Status: DC | PRN
  Administered 2015-08-19: 1 via TOPICAL

## 2015-08-19 MED ORDER — SUGAMMADEX SODIUM 200 MG/2ML IV SOLN
INTRAVENOUS | Status: AC
  Filled 2015-08-19: qty 2

## 2015-08-19 MED ORDER — ONDANSETRON HCL 4 MG/2ML IJ SOLN: 4.0000 mg | INTRAMUSCULAR | Status: DC | PRN

## 2015-08-19 MED ORDER — CEFAZOLIN SODIUM 1-5 GM-% IV SOLN
1.0000 g | Freq: Three times a day (TID) | INTRAVENOUS | Status: AC
  Administered 2015-08-19 – 2015-08-20 (×2): 1 g via INTRAVENOUS
  Filled 2015-08-19 (×2): qty 50

## 2015-08-19 MED ORDER — FENTANYL CITRATE (PF) 250 MCG/5ML IJ SOLN
INTRAMUSCULAR | Status: AC
  Filled 2015-08-19: qty 5

## 2015-08-19 MED ORDER — ACETAMINOPHEN 325 MG PO TABS: 650.0000 mg | ORAL_TABLET | ORAL | Status: DC | PRN

## 2015-08-19 MED ORDER — BUPIVACAINE HCL (PF) 0.25 % IJ SOLN
INTRAMUSCULAR | Status: DC | PRN
  Administered 2015-08-19: 4 mL

## 2015-08-19 SURGICAL SUPPLY — 43 items
APL SKNCLS STERI-STRIP NONHPOA (GAUZE/BANDAGES/DRESSINGS) ×1
BAG DECANTER FOR FLEXI CONT (MISCELLANEOUS) ×2 IMPLANT
BENZOIN TINCTURE PRP APPL 2/3 (GAUZE/BANDAGES/DRESSINGS) ×2 IMPLANT
BUR MATCHSTICK NEURO 3.0 LAGG (BURR) ×2 IMPLANT
CANISTER SUCT 3000ML PPV (MISCELLANEOUS) ×2 IMPLANT
DRAPE LAPAROTOMY 100X72X124 (DRAPES) ×2 IMPLANT
DRAPE MICROSCOPE LEICA (MISCELLANEOUS) ×2 IMPLANT
DRAPE POUCH INSTRU U-SHP 10X18 (DRAPES) ×2 IMPLANT
DRAPE SURG 17X23 STRL (DRAPES) ×2 IMPLANT
DRSG OPSITE POSTOP 3X4 (GAUZE/BANDAGES/DRESSINGS) ×1 IMPLANT
DURAPREP 26ML APPLICATOR (WOUND CARE) ×2 IMPLANT
ELECT REM PT RETURN 9FT ADLT (ELECTROSURGICAL) ×2
ELECTRODE REM PT RTRN 9FT ADLT (ELECTROSURGICAL) ×1 IMPLANT
GAUZE SPONGE 4X4 16PLY XRAY LF (GAUZE/BANDAGES/DRESSINGS) IMPLANT
GLOVE BIO SURGEON STRL SZ8 (GLOVE) ×2 IMPLANT
GLOVE BIOGEL PI IND STRL 7.0 (GLOVE) IMPLANT
GLOVE BIOGEL PI INDICATOR 7.0 (GLOVE) ×2
GLOVE SURG SS PI 6.5 STRL IVOR (GLOVE) ×2 IMPLANT
GOWN STRL REUS W/ TWL LRG LVL3 (GOWN DISPOSABLE) IMPLANT
GOWN STRL REUS W/ TWL XL LVL3 (GOWN DISPOSABLE) ×1 IMPLANT
GOWN STRL REUS W/TWL 2XL LVL3 (GOWN DISPOSABLE) IMPLANT
GOWN STRL REUS W/TWL LRG LVL3 (GOWN DISPOSABLE)
GOWN STRL REUS W/TWL XL LVL3 (GOWN DISPOSABLE) ×4
HEMOSTAT POWDER KIT SURGIFOAM (HEMOSTASIS) ×1 IMPLANT
KIT BASIN OR (CUSTOM PROCEDURE TRAY) ×2 IMPLANT
KIT ROOM TURNOVER OR (KITS) ×2 IMPLANT
NDL HYPO 25X1 1.5 SAFETY (NEEDLE) ×1 IMPLANT
NDL SPNL 20GX3.5 QUINCKE YW (NEEDLE) IMPLANT
NEEDLE HYPO 25X1 1.5 SAFETY (NEEDLE) ×2 IMPLANT
NEEDLE SPNL 20GX3.5 QUINCKE YW (NEEDLE) ×2 IMPLANT
NS IRRIG 1000ML POUR BTL (IV SOLUTION) ×2 IMPLANT
PACK LAMINECTOMY NEURO (CUSTOM PROCEDURE TRAY) ×2 IMPLANT
PAD ARMBOARD 7.5X6 YLW CONV (MISCELLANEOUS) ×8 IMPLANT
RUBBERBAND STERILE (MISCELLANEOUS) ×4 IMPLANT
SPONGE SURGIFOAM ABS GEL SZ50 (HEMOSTASIS) ×2 IMPLANT
STRIP CLOSURE SKIN 1/2X4 (GAUZE/BANDAGES/DRESSINGS) ×2 IMPLANT
SUT VIC AB 0 CT1 18XCR BRD8 (SUTURE) ×1 IMPLANT
SUT VIC AB 0 CT1 8-18 (SUTURE) ×2
SUT VIC AB 2-0 CP2 18 (SUTURE) ×2 IMPLANT
SUT VIC AB 3-0 SH 8-18 (SUTURE) ×2 IMPLANT
TOWEL OR 17X24 6PK STRL BLUE (TOWEL DISPOSABLE) ×2 IMPLANT
TOWEL OR 17X26 10 PK STRL BLUE (TOWEL DISPOSABLE) ×2 IMPLANT
WATER STERILE IRR 1000ML POUR (IV SOLUTION) ×2 IMPLANT

## 2015-08-19 NOTE — Progress Notes (Signed)
Report given to laure reynolds rn as caregiver 

## 2015-08-19 NOTE — Op Note (Signed)
08/19/2015  12:48 PM  PATIENT:  [name]  82 y.o. female  PRE-OPERATIVE DIAGNOSIS:  L4-5 spondylolisthesis with disc herniation with inferior free fragment, left leg pain  POST-OPERATIVE DIAGNOSIS:  Same  PROCEDURE:  Left L4-5 hemilaminectomy, medial facetectomy, and foraminotomy followed by microdiscectomy  SURGEON:  Sherley Bounds, MD  ASSISTANTS: Dr. Joya Salm  ANESTHESIA:   General  EBL: 50 ml  Total I/O In: -  Out: 50 [Blood:50]  BLOOD ADMINISTERED:none  DRAINS: None   SPECIMEN:  No Specimen  INDICATION FOR PROCEDURE: This patient presented with severe left leg pain. She had an MRI which showed spondylolisthesis at L4-5 with an inferior free fragment compressing the left L5 nerve root. Should her medical management including injections without relief. I recommended a microdiscectomy. She also had moderate stenosis L2-3 [place] her symptoms are most likely coming from the herniated disc L4-5 on the left.  Patient understood the risks, benefits, and alternatives and potential outcomes and wished to proceed.  PROCEDURE DETAILS: The patient was taken to the operating room and after induction of adequate generalized endotracheal anesthesia, the patient was rolled into the prone position on the Wilson frame and all pressure points were padded. The lumbar region was cleaned and then prepped with DuraPrep and draped in the usual sterile fashion. 5 cc of local anesthesia was injected and then a dorsal midline incision was made and carried down to the lumbo sacral fascia. The fascia was opened and the paraspinous musculature was taken down in a subperiosteal fashion to expose L4-5 on the left. Intraoperative x-ray confirmed my level, and then I used a combination of the high-speed drill and the Kerrison punches to perform a hemilaminectomy, medial facetectomy, and foraminotomy at L4-L5 on the left. The underlying yellow ligament was opened and removed in a piecemeal fashion to expose the  underlying dura and exiting nerve root. I undercut the lateral recess and dissected down until I was medial to and distal to the pedicle. The nerve root was well decompressed. We then gently retracted the nerve root medially with a retractor, coagulated the epidural venous vasculature, and found an inferior free fragment this seemed to have a capsule around it. this was incised and then removed with a coronary dilator and a micropituitary rongeur. I then palpated with a coronary dilator along the nerve root and into the foramen to assure adequate decompression. I felt no more compression of the nerve root. I irrigated with saline solution containing bacitracin. Achieved hemostasis with bipolar cautery, lined the dura with Gelfoam, and then closed the fascia with 0 Vicryl. I closed the subcutaneous tissues with 2-0 Vicryl and the subcuticular tissues with 3-0 Vicryl. The skin was then closed with benzoin and Steri-Strips. The drapes were removed, a sterile dressing was applied. The patient was awakened from general anesthesia and transferred to the recovery room in stable condition. At the end of the procedure all sponge, needle and instrument counts were correct.   PLAN OF CARE: Admit for overnight observation  PATIENT DISPOSITION:  PACU - hemodynamically stable.   Delay start of Pharmacological VTE agent (>24hrs) due to surgical blood loss or risk of bleeding:  yes

## 2015-08-19 NOTE — Anesthesia Preprocedure Evaluation (Addendum)
Anesthesia Evaluation  Patient identified by MRN, date of birth, ID band Patient awake    Reviewed: Allergy & Precautions, H&P , NPO status , Patient's Chart, lab work & pertinent test results, reviewed documented beta blocker date and time   Airway Mallampati: II  TM Distance: >3 FB Neck ROM: Full    Dental no notable dental hx. (+) Teeth Intact, Dental Advisory Given   Pulmonary neg pulmonary ROS,    Pulmonary exam normal breath sounds clear to auscultation       Cardiovascular hypertension, Pt. on medications and Pt. on home beta blockers + CAD, + Past MI, + CABG and +CHF   Rhythm:Regular Rate:Normal     Neuro/Psych negative neurological ROS  negative psych ROS   GI/Hepatic negative GI ROS, Neg liver ROS,   Endo/Other  Hypothyroidism   Renal/GU negative Renal ROS  negative genitourinary   Musculoskeletal  (+) Arthritis , Osteoarthritis,    Abdominal   Peds  Hematology negative hematology ROS (+)   Anesthesia Other Findings   Reproductive/Obstetrics negative OB ROS                            Anesthesia Physical Anesthesia Plan  ASA: IV  Anesthesia Plan: General   Post-op Pain Management:    Induction: Intravenous  Airway Management Planned: Oral ETT  Additional Equipment: Arterial line  Intra-op Plan:   Post-operative Plan: Extubation in OR  Informed Consent: I have reviewed the patients History and Physical, chart, labs and discussed the procedure including the risks, benefits and alternatives for the proposed anesthesia with the patient or authorized representative who has indicated his/her understanding and acceptance.   Dental advisory given  Plan Discussed with: CRNA  Anesthesia Plan Comments:         Anesthesia Quick Evaluation

## 2015-08-19 NOTE — Anesthesia Postprocedure Evaluation (Signed)
Anesthesia Post Note  Patient: [name]  Procedure(s) Performed: Procedure(s) (LRB): Left Lumbar four-five Microdiskectomy (Left)  Patient location during evaluation: PACU Anesthesia Type: General Level of consciousness: awake and alert Pain management: pain level controlled Vital Signs Assessment: post-procedure vital signs reviewed and stable Respiratory status: spontaneous breathing, nonlabored ventilation, respiratory function stable and patient connected to nasal cannula oxygen Cardiovascular status: blood pressure returned to baseline and stable Postop Assessment: no signs of nausea or vomiting Anesthetic complications: no    Last Vitals:  Filed Vitals:   08/19/15 1330 08/19/15 1345  BP: 149/77   Pulse: 82 79  Temp:    Resp: 9 9    Last Pain:  Filed Vitals:   08/19/15 1346  PainSc: 2                  Denim Start,W. EDMOND

## 2015-08-19 NOTE — Anesthesia Procedure Notes (Signed)
Procedure Name: Intubation Date/Time: 08/19/2015 12:11 PM Performed by: Merdis Delay Pre-anesthesia Checklist: Patient identified, Timeout performed, Emergency Drugs available, Suction available and Patient being monitored Patient Re-evaluated:Patient Re-evaluated prior to inductionOxygen Delivery Method: Circle system utilized Preoxygenation: Pre-oxygenation with 100% oxygen Intubation Type: IV induction Ventilation: Mask ventilation without difficulty Laryngoscope Size: Glidescope and 4 Tube type: Oral Tube size: 7.0 mm Number of attempts: 1 Airway Equipment and Method: Video-laryngoscopy Placement Confirmation: ETT inserted through vocal cords under direct vision,  breath sounds checked- equal and bilateral,  positive ETCO2 and CO2 detector Secured at: 22 cm Tube secured with: Tape Dental Injury: Teeth and Oropharynx as per pre-operative assessment  Difficulty Due To: Difficulty was unanticipated Comments: DL x1 with MAC 3- epiglottis view only-->Limited neck movement s/p induction. Tried to pass bougie but unable to feel tracheal rings. Converted to glidescope 4--> grade 1 view.

## 2015-08-19 NOTE — H&P (Signed)
Subjective: Patient is a 82 y.o. female admitted for L4-5 micro. Onset of symptoms was several months ago, gradually worsening since that time.  The pain is rated severe, intense, unremitting, and is located at the across the lower back and radiates to LLE. The pain is described as aching and occurs all day. The symptoms have been progressive. Symptoms are exacerbated by exercise. MRI or CT showed HNP   Past Medical History  Diagnosis Date  . Acute MI, anterior wall ([place])     a. 07/07/2011  . Osteoarthritis of knee     Bilateral.  a. s/p LTKA ~2007;  b. s/p RTKA 10/2010  . CAD (coronary artery disease)     a. 07/07/2011 anterior MI- Occluded LAD w/ emergent CABG x 2 (SVG->LAD->D1).  . VSD (ventricular septal defect)     a.  07/07/2011 - Noted @ time of MI - s/p repair @ time of CABG;  b. 09/19/2011 Echo: small residula VSD (present on 2/11 echo also)  . HTN (hypertension)   . 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 Radiation initiated 09/2011  . Ischemic cardiomyopathy     a. 07/17/11 Echo: EF 35%;  b. 09/19/2011 Echo: EF 30-35%, Mid-Dist AntSept & Apical AK, mild MR.  Small residual VSD.  Marland Kitchen Hypothyroidism   . History of radiation therapy 09/05/11-10/12/11    thyoma  . Shortness of breath     recently SOB  . Seasonal allergies   . Hemorrhoid     Past Surgical History  Procedure Laterality Date  . Joint replacement    . Total knee arthroplasty      a.  Left ~ 2007, Right 10/2010  . Cataract extraction, bilateral    . Coronary artery bypass graft  07/07/2011    Procedure: CORONARY ARTERY BYPASS GRAFTING (CABG);  Surgeon: Tharon Aquas Adelene Idler, MD;  Location: [place];  Service: Open Heart Surgery;  Laterality: N/A;  times two using greater saphenous vein harvested via endovascular vein harvest  . Vsd repair  07/07/2011    Procedure: VENTRICULAR SEPTAL DEFECT (VSD) REPAIR;  Surgeon: Len Childs, MD;  Location: [place];  Service: Open Heart Surgery;  Laterality:  N/A;  with bovine pericardium 4cm x 4cm  . Mass excision  07/07/2011    Procedure: EXCISION MASS;  Surgeon: Tharon Aquas Adelene Idler, MD;  Location: [place];  Service: Open Heart Surgery;  Laterality: N/A;  Excision of mediastinal mass  . Abdominal hysterectomy    . Hemorrhoid surgery    . Eye surgery Bilateral     cataract  . Tonsillectomy    . Myringotomy with tube placement Right 10/24/2012    Procedure: MYRINGOTOMY WITH TUBE PLACEMENT;  Surgeon: Melissa Montane, MD;  Location: [place];  Service: ENT;  Laterality: Right;  . Nasal endoscopy with epistaxis control N/A 10/24/2012    Procedure: NASAL ENDOSCOPY WITH EPISTAXIS CONTROL;  Surgeon: Melissa Montane, MD;  Location: [place];  Service: ENT;  Laterality: N/A;  nasal endoscopy without epistaxis control... only need 30 minutes   . Left heart catheterization with coronary angiogram N/A 07/07/2011    Procedure: LEFT HEART CATHETERIZATION WITH CORONARY ANGIOGRAM;  Surgeon: Josue Hector, MD;  Location: [facility] CATH LAB;  Service: Cardiovascular;  Laterality: N/A;  . Right heart catheterization  07/07/2011    Procedure: RIGHT HEART CATH;  Surgeon: Josue Hector, MD;  Location: [facility] CATH LAB;  Service: Cardiovascular;;    Prior to Admission medications  Medication Sig Start Date End Date Taking? Authorizing Provider  aspirin EC 81 MG tablet Take 81 mg by mouth daily.   Yes Historical Provider, MD  calcium carbonate (TUMS - DOSED IN MG ELEMENTAL CALCIUM) 500 MG chewable tablet Chew 1 tablet by mouth daily as needed for indigestion or heartburn.    Yes Historical Provider, MD  carvedilol (COREG) 6.25 MG tablet Take 1 tablet (6.25 mg total) by mouth 2 (two) times daily with a meal. 09/22/11 08/16/15 Yes Rogelia Mire, NP  furosemide (LASIX) 20 MG tablet TAKE 1/2 TABLET (10 MG TOTAL) BY MOUTH DAILY. 04/26/15  Yes Josue Hector, MD  levothyroxine (SYNTHROID, LEVOTHROID) 50 MCG tablet Take 50 mcg by mouth daily before breakfast.   Yes Historical Provider, MD  losartan (COZAAR)  100 MG tablet Take 100 mg by mouth daily. 10/26/14  Yes Historical Provider, MD  metFORMIN (GLUCOPHAGE) 500 MG tablet Take 500 mg by mouth daily with breakfast.  02/07/14  Yes Historical Provider, MD  Naproxen Sod-Diphenhydramine (ALEVE PM PO) Take 1 tablet by mouth at bedtime.    Yes Historical Provider, MD  simvastatin (ZOCOR) 40 MG tablet Take 40 mg by mouth every evening.   Yes Historical Provider, MD   Allergies  Allergen Reactions  . Amoxapine And Related Hives    Social History  Substance Use Topics  . Smoking status: Never Smoker   . Smokeless tobacco: Never Used  . Alcohol Use: No    Family History  Problem Relation Age of Onset  . Heart disease Mother      Review of Systems  Positive ROS: neg  All other systems have been reviewed and were otherwise negative with the exception of those mentioned in the HPI and as above.  Objective: Vital signs in last 24 hours: Temp:  [97.8 F (36.6 C)] 97.8 F (36.6 C) (03/16 0952) Pulse Rate:  [72] 72 (03/16 0952) Resp:  [18] 18 (03/16 0952) BP: (136)/(43) 136/43 mmHg (03/16 0952) SpO2:  [100 %] 100 % (03/16 0952) Weight:  [67.189 kg (148 lb 2 oz)] 67.189 kg (148 lb 2 oz) (03/16 0952)  General Appearance: Alert, cooperative, no distress, appears stated age Head: Normocephalic, without obvious abnormality, atraumatic Eyes: PERRL, conjunctiva/corneas clear, EOM's intact    Neck: Supple, symmetrical, trachea midline Back: Symmetric, no curvature, ROM normal, no CVA tenderness Lungs:  respirations unlabored Heart: Regular rate and rhythm Abdomen: Soft, non-tender Extremities: Extremities normal, atraumatic, no cyanosis or edema Pulses: 2+ and symmetric all extremities Skin: Skin color, texture, turgor normal, no rashes or lesions  NEUROLOGIC:   Mental status: Alert and oriented x4,  no aphasia, good attention span, fund of knowledge, and memory Motor Exam - grossly normal Sensory Exam - grossly normal Reflexes:  1= Coordination - grossly normal Gait - grossly normal Balance - grossly normal Cranial Nerves: I: smell Not tested  II: visual acuity  OS: nl    OD: nl  II: visual fields Full to confrontation  II: pupils Equal, round, reactive to light  III,VII: ptosis None  III,IV,VI: extraocular muscles  Full ROM  V: mastication Normal  V: facial light touch sensation  Normal  V,VII: corneal reflex  Present  VII: facial muscle function - upper  Normal  VII: facial muscle function - lower Normal  VIII: hearing Not tested  IX: soft palate elevation  Normal  IX,X: gag reflex Present  XI: trapezius strength  5/5  XI: sternocleidomastoid strength 5/5  XI: neck flexion strength  5/5  XII:  tongue strength  Normal    Data Review Lab Results  Component Value Date   WBC 9.9 08/17/2015   HGB 13.1 08/17/2015   HCT 40.4 08/17/2015   MCV 87.8 08/17/2015   PLT 286 08/17/2015   Lab Results  Component Value Date   NA 140 08/17/2015   K 4.5 08/17/2015   CL 108 08/17/2015   CO2 22 08/17/2015   BUN 15 08/17/2015   CREATININE 1.05* 08/17/2015   GLUCOSE 139* 08/17/2015   Lab Results  Component Value Date   INR 1.13 08/17/2015    Assessment/Plan: Patient admitted for L L4-5 microdiskectomy. Patient has failed a reasonable attempt at conservative therapy.  I explained the condition and procedure to the patient and answered any questions.  Patient wishes to proceed with procedure as planned. Understands risks/ benefits and typical outcomes of procedure.   JONES,DAVID S 08/19/2015 10:56 AM

## 2015-08-19 NOTE — Transfer of Care (Signed)
Immediate Anesthesia Transfer of Care Note  Patient: [name]  Procedure(s) Performed: Procedure(s) with comments: Left Lumbar four-five Microdiskectomy (Left) - left  Patient Location: PACU  Anesthesia Type:General  Level of Consciousness: awake, alert  and oriented  Airway & Oxygen Therapy: Patient Spontanous Breathing and Patient connected to face mask oxygen  Post-op Assessment: Report given to RN and Post -op Vital signs reviewed and stable  Post vital signs: Reviewed and stable  Last Vitals:  Filed Vitals:   08/19/15 0952  BP: 136/43  Pulse: 72  Temp: 36.6 C  Resp: 18    Complications: No apparent anesthesia complications

## 2015-08-20 ENCOUNTER — Encounter (HOSPITAL_COMMUNITY): Payer: Self-pay | Admitting: Neurological Surgery

## 2015-08-20 DIAGNOSIS — M5126 Other intervertebral disc displacement, lumbar region: Secondary | ICD-10-CM | POA: Diagnosis not present

## 2015-08-20 NOTE — Progress Notes (Signed)
Patient alert and oriented, mae's well, voiding adequate amount of urine, swallowing without difficulty, no c/o pain. Patient discharged home with family. Script and discharged instructions given to patient. Patient and family stated understanding of d/c instructions given and has an appointment with MD. 

## 2015-08-20 NOTE — Discharge Summary (Signed)
Physician Discharge Summary  Patient ID: [name] MRN: [id number] DOB/AGE: January 01, 1925 82 y.o.  Admit date: 08/19/2015 Discharge date: 08/20/2015  Admission Diagnoses:L4-5 spondylolisthesis, spinal stenosis, herniated disc, lumbago, lumbar radiculopathy  Discharge Diagnoses: The same Active Problems:   S/P lumbar laminectomy   Discharged Condition: good  Hospital Course: Dr. Ronnald Ramp performed a left L4-5 discectomy on the patient on 08/19/2015.  The patient's postoperative course was unremarkable. On postoperative day #1 the patient requested discharge to home. The patient, and her daughter, were given written and oral discharge instructions. All questions were answered.  Consults: None Significant Diagnostic Studies: None Treatments: Left L4-5 discectomy using microdissection Discharge Exam: Blood pressure 121/40, pulse 89, temperature 98.3 F (36.8 C), temperature source Oral, resp. rate 20, height 5\' 1"  (1.549 m), weight 67.189 kg (148 lb 2 oz), SpO2 100 %. The patient is alert and pleasant. Her dressing is clean and dry. She is moving her lower extremities well.  Disposition: Home  Discharge Instructions    Call MD for:  difficulty breathing, headache or visual disturbances    Complete by:  As directed      Call MD for:  extreme fatigue    Complete by:  As directed      Call MD for:  hives    Complete by:  As directed      Call MD for:  persistant dizziness or light-headedness    Complete by:  As directed      Call MD for:  persistant nausea and vomiting    Complete by:  As directed      Call MD for:  redness, tenderness, or signs of infection (pain, swelling, redness, odor or green/yellow discharge around incision site)    Complete by:  As directed      Call MD for:  severe uncontrolled pain    Complete by:  As directed      Call MD for:  temperature >100.4    Complete by:  As directed      Diet - low sodium heart healthy    Complete by:  As directed      Discharge instructions    Complete by:  As directed   Call [phone] for a followup appointment. Take a stool softener while you are using pain medications.     Driving Restrictions    Complete by:  As directed   Do not drive for 2 weeks.     Increase activity slowly    Complete by:  As directed      Lifting restrictions    Complete by:  As directed   Do not lift more than 5 pounds. No excessive bending or twisting.     May shower / Bathe    Complete by:  As directed   He may shower after the pain she is removed 3 days after surgery. Leave the incision alone.     Remove dressing in 48 hours    Complete by:  As directed   Your stitches are under the scan and will dissolve by themselves. The Steri-Strips will fall off after you take a few showers. Do not rub back or pick at the wound, Leave the wound alone.            Medication List    TAKE these medications        ALEVE PM PO  Take 1 tablet by mouth at bedtime.     aspirin EC 81 MG tablet  Take 81 mg by mouth  daily.     calcium carbonate 500 MG chewable tablet  Commonly known as:  TUMS - dosed in mg elemental calcium  Chew 1 tablet by mouth daily as needed for indigestion or heartburn.     carvedilol 6.25 MG tablet  Commonly known as:  COREG  Take 1 tablet (6.25 mg total) by mouth 2 (two) times daily with a meal.     furosemide 20 MG tablet  Commonly known as:  LASIX  TAKE 1/2 TABLET (10 MG TOTAL) BY MOUTH DAILY.     levothyroxine 50 MCG tablet  Commonly known as:  SYNTHROID, LEVOTHROID  Take 50 mcg by mouth daily before breakfast.     losartan 100 MG tablet  Commonly known as:  COZAAR  Take 100 mg by mouth daily.     metFORMIN 500 MG tablet  Commonly known as:  GLUCOPHAGE  Take 500 mg by mouth daily with breakfast.     simvastatin 40 MG tablet  Commonly known as:  ZOCOR  Take 40 mg by mouth every evening.         SignedNewman Pies D 08/20/2015, 10:04 AM

## 2015-11-05 ENCOUNTER — Ambulatory Visit (INDEPENDENT_AMBULATORY_CARE_PROVIDER_SITE_OTHER): Payer: Medicare Other | Admitting: Cardiovascular Disease

## 2015-11-05 ENCOUNTER — Encounter: Payer: Self-pay | Admitting: Cardiovascular Disease

## 2015-11-05 VITALS — BP 138/60 | HR 73 | Resp 18 | Ht 61.0 in | Wt 148.0 lb

## 2015-11-05 DIAGNOSIS — I255 Ischemic cardiomyopathy: Secondary | ICD-10-CM | POA: Diagnosis not present

## 2015-11-05 DIAGNOSIS — I10 Essential (primary) hypertension: Secondary | ICD-10-CM | POA: Diagnosis not present

## 2015-11-05 NOTE — Patient Instructions (Signed)

## 2015-11-05 NOTE — Progress Notes (Signed)
Patient ID: [name], female   DOB: 1925/05/28, 82 y.o.   MRN: [id number]   [name] 82 y.o.  who had delayed presentation of a large anterior MI on 07/07/11. Complicated by shock and VSD. Had CABG with SVG to Diagonal and LAD with VSD patch. Echo 4/16 reviewed and has EF 30-35% with small residual VSD. No thrombus. 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 Caused erosion of her gi mucosa and is now on sucralafate. She denies dyspnea, palpitations, chest pain. She has mild LE edema.   Husband died 3 years ago.   Daughter is very supportive. She still has her license and drives   Echo [id number] reviewed  Study Conclusions  - Left ventricle: The cavity size was normal. There was mild focal basal hypertrophy of the septum. Systolic function was moderately to severely reduced. The estimated ejection fraction was in the range of 30% to 35%. There is akinesis of the mid-apicalanteroseptal and apical myocardium. Doppler parameters are consistent with abnormal left ventricular relaxation (grade 1 diastolic dysfunction). - Aortic valve: There was mild regurgitation. - Mitral valve: There was mild regurgitation.  Impressions:  - Extremely limited due to poor sound wave transmission; LV function difficult to quantitate as endocardium not well visualized; there appears to be anteroseptal and apical akinesis with significantly reduced LV function (?EF 30-35); grade 1 diastolic dysfunction; mild AI and MR. Compared to 09/19/11, distal muscular VSD is not well interrogated and not visualized; suggest TEE or cardiac MRI to further assess if clinically indicated.  Dyspnea improved   She does not really note more LE edema No recent CXR. Compliant with meds. No cough sputum Her husband with parkinsons finally passed in December. Her son who is a highly functioning schizophrenic lives with her and her daughter here today is  also very attentive.   03/05/14  BNP only 182  and Cr 1.4    Recent back surgery with Dr Ronnald Ramp uneventful  08/20/15 Left L4-5 hemilaminectomy, medial facetectomy, and foraminotomy followed by microdiscectomy  ROS: Denies fever, malais, weight loss, blurry vision, decreased visual acuity, cough, sputum, SOB, hemoptysis, pleuritic pain, palpitaitons, heartburn, abdominal pain, melena, lower extremity edema, claudication, or rash.  All other systems reviewed and negative  General: Affect appropriate Healthy:  appears stated age 53: normal Neck supple with no adenopathy JVP normal no bruits no thyromegaly Lungs clear with no wheezing and good diaphragmatic motion Heart:  S1/S2 SEM no VSD  murmur, no rub, gallop or click PMI normal Abdomen: benighn, BS positve, no tenderness, no AAA no bruit.  No HSM or HJR Distal pulses intact with no bruits No edema Neuro non-focal Skin warm and dry No muscular weakness   Current Outpatient Prescriptions  Medication Sig Dispense Refill  . aspirin EC 81 MG tablet Take 81 mg by mouth daily.    . calcium carbonate (TUMS - DOSED IN MG ELEMENTAL CALCIUM) 500 MG chewable tablet Chew 1 tablet by mouth daily as needed for indigestion or heartburn.     Marland Kitchen ibuprofen (ADVIL,MOTRIN) 200 MG tablet Take 200 mg by mouth every 6 (six) hours as needed for moderate pain.    Marland Kitchen levothyroxine (SYNTHROID, LEVOTHROID) 50 MCG tablet Take 25 mcg by mouth daily before breakfast.     . losartan (COZAAR) 100 MG tablet Take 100 mg by mouth daily.  2  . metFORMIN (GLUCOPHAGE) 500 MG tablet Take 250 mg by mouth 2 (two) times daily with a meal.     .  simvastatin (ZOCOR) 40 MG tablet Take 40 mg by mouth every evening.    . solifenacin (VESICARE) 10 MG tablet Take 10 mg by mouth daily.    . carvedilol (COREG) 6.25 MG tablet Take 1 tablet (6.25 mg total) by mouth 2 (two) times daily with a meal. 180 tablet 3  . [DISCONTINUED] pantoprazole (PROTONIX) 40 MG tablet Take 1 tablet (40  mg total) by mouth daily at 12 noon.    . [DISCONTINUED] ramipril (ALTACE) 5 MG capsule Take 1 capsule (5 mg total) by mouth daily. 30 capsule 1   No current facility-administered medications for this visit.    Allergies  Amoxapine and related  Electrocardiogram:  03/05/14  SR rate 83  PR 240  Old anterolateral MI   04/26/15  SR rate 82  Old anterolateral MI   Assessment and Plan CAD:  Stable with no angina and good activity level.  Continue medical Rx VSD:  Patch closure at time of CABG  07/2011  Echo with intact patch Thymoma:  In remission post XRT no palpable neck masses Chol:   Cholesterol is at goal.  Continue current dose of statin and diet Rx.  No myalgias or side effects.  F/U  LFT's in 6 months. Lab Results  Component Value Date   LDLCALC 15 07/08/2011   Edema:  Dependant venous continue low dose lasix CHF:  Euvolemic EF 35%  No AICD given age continue beta blocker, Ace and diuretic Lumbago:  Post laminectomy with good pain relief f/u Dr Vickie Epley

## 2015-12-27 DIAGNOSIS — H6123 Impacted cerumen, bilateral: Secondary | ICD-10-CM | POA: Insufficient documentation

## 2015-12-27 DIAGNOSIS — H722X1 Other marginal perforations of tympanic membrane, right ear: Secondary | ICD-10-CM | POA: Insufficient documentation

## 2015-12-27 DIAGNOSIS — H903 Sensorineural hearing loss, bilateral: Secondary | ICD-10-CM | POA: Insufficient documentation

## 2016-06-13 DIAGNOSIS — R69 Illness, unspecified: Secondary | ICD-10-CM | POA: Diagnosis not present

## 2016-07-09 NOTE — Progress Notes (Signed)
Patient ID: [name], female   DOB: Aug 26, 1924, 83 y.o.   MRN: [id number]   [name]  who had delayed presentation of a large anterior MI on 07/07/11. Complicated by shock and VSD. Had CABG with SVG to Diagonal and LAD with VSD patch. Echo 4/16 reviewed and has EF 30-35% with small residual VSD. No thrombus. 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 Caused erosion of her gi mucosa and is now on sucralafate. She denies dyspnea, palpitations, chest pain. She has mild LE edema.   Husband died 4 years ago.   Daughter is very supportive. She still has her license and drives   Echo [id number] reviewed  Study Conclusions  - Left ventricle: The cavity size was normal. There was mild focal basal hypertrophy of the septum. Systolic function was moderately to severely reduced. The estimated ejection fraction was in the range of 30% to 35%. There is akinesis of the mid-apicalanteroseptal and apical myocardium. Doppler parameters are consistent with abnormal left ventricular relaxation (grade 1 diastolic dysfunction). - Aortic valve: There was mild regurgitation. - Mitral valve: There was mild regurgitation.  Impressions:  - Extremely limited due to poor sound wave transmission; LV function difficult to quantitate as endocardium not well visualized; there appears to be anteroseptal and apical akinesis with significantly reduced LV function (?EF 30-35); grade 1 diastolic dysfunction; mild AI and MR. Compared to 09/19/11, distal muscular VSD is not well interrogated and not visualized; suggest TEE or cardiac MRI to further assess if clinically indicated.  Dyspnea improved   She does not really note more LE edema No recent CXR. Compliant with meds. No cough sputum Her husband with parkinsons finally passed in December. Her son who is a highly functioning schizophrenic lives with her and her daughter here today is  also very attentive.   03/05/14  BNP only 182  and Cr 1.4    Back surgery with Dr Ronnald Ramp uneventful  08/20/15 Left L4-5 hemilaminectomy, medial facetectomy, and foraminotomy followed by microdiscectomy  ROS: Denies fever, malais, weight loss, blurry vision, decreased visual acuity, cough, sputum, SOB, hemoptysis, pleuritic pain, palpitaitons, heartburn, abdominal pain, melena, lower extremity edema, claudication, or rash.  All other systems reviewed and negative  General: Affect appropriate Healthy:  appears stated age 12: normal Neck supple with no adenopathy JVP normal no bruits no thyromegaly Lungs clear with no wheezing and good diaphragmatic motion Heart:  S1/S2 SEM no VSD  murmur, no rub, gallop or click PMI normal Abdomen: benighn, BS positve, no tenderness, no AAA no bruit.  No HSM or HJR Distal pulses intact with no bruits No edema Neuro non-focal Skin warm and dry No muscular weakness   Current Outpatient Prescriptions  Medication Sig Dispense Refill  . aspirin EC 81 MG tablet Take 81 mg by mouth daily.    . calcium carbonate (TUMS - DOSED IN MG ELEMENTAL CALCIUM) 500 MG chewable tablet Chew 1 tablet by mouth daily as needed for indigestion or heartburn.     . carvedilol (COREG) 6.25 MG tablet Take 1 tablet (6.25 mg total) by mouth 2 (two) times daily with a meal. 180 tablet 3  . ibuprofen (ADVIL,MOTRIN) 200 MG tablet Take 200 mg by mouth every 6 (six) hours as needed for moderate pain.    Marland Kitchen levothyroxine (SYNTHROID, LEVOTHROID) 50 MCG tablet Take 25 mcg by mouth daily before breakfast.     . losartan (COZAAR) 100 MG tablet Take 100 mg by  mouth daily.  2  . metFORMIN (GLUCOPHAGE) 500 MG tablet Take 250 mg by mouth 2 (two) times daily with a meal.     . oxybutynin (DITROPAN) 5 MG tablet Take 1 tablet by mouth 2 (two) times daily.    . simvastatin (ZOCOR) 40 MG tablet Take 40 mg by mouth every evening.     No current facility-administered medications for this visit.      Allergies  Amoxapine and related and Clozapine  Electrocardiogram:  03/05/14  SR rate 83  PR 240  Old anterolateral MI   04/26/15  SR rate 82  Old anterolateral MI  07/14/15 SR rate 9 old anterior infarct LAE no changes   Assessment and Plan CAD:  Stable with no angina and good activity level.  Continue medical Rx VSD:  Patch closure at time of CABG  07/2011  Echo with intact patch Thymoma:  In remission post XRT no palpable neck masses Chol:   Cholesterol is at goal.  Continue current dose of statin and diet Rx.  No myalgias or side effects.  F/U  LFT's in 6 months. Lab Results  Component Value Date   LDLCALC 15 07/08/2011   Edema:  Dependant venous continue low dose lasix CHF:  Euvolemic EF 35%  No AICD given age continue beta blocker, Ace and diuretic Lumbago:  Post laminectomy with good pain relief f/u Dr Vickie Epley

## 2016-07-13 ENCOUNTER — Ambulatory Visit (INDEPENDENT_AMBULATORY_CARE_PROVIDER_SITE_OTHER): Payer: Medicare HMO | Admitting: Cardiovascular Disease

## 2016-07-13 ENCOUNTER — Encounter: Payer: Self-pay | Admitting: Cardiovascular Disease

## 2016-07-13 ENCOUNTER — Encounter (INDEPENDENT_AMBULATORY_CARE_PROVIDER_SITE_OTHER): Payer: Self-pay

## 2016-07-13 VITALS — BP 132/60 | HR 69 | Ht 61.0 in | Wt 152.4 lb

## 2016-07-13 DIAGNOSIS — I10 Essential (primary) hypertension: Secondary | ICD-10-CM

## 2016-07-13 NOTE — Patient Instructions (Signed)

## 2016-09-18 ENCOUNTER — Other Ambulatory Visit (HOSPITAL_COMMUNITY): Payer: Self-pay | Admitting: Internal Medicine

## 2016-09-18 DIAGNOSIS — M5416 Radiculopathy, lumbar region: Secondary | ICD-10-CM | POA: Diagnosis not present

## 2016-09-18 DIAGNOSIS — R600 Localized edema: Secondary | ICD-10-CM | POA: Diagnosis not present

## 2016-09-18 DIAGNOSIS — M7989 Other specified soft tissue disorders: Principal | ICD-10-CM

## 2016-09-18 DIAGNOSIS — M79606 Pain in leg, unspecified: Secondary | ICD-10-CM

## 2016-09-18 DIAGNOSIS — Z6830 Body mass index (BMI) 30.0-30.9, adult: Secondary | ICD-10-CM | POA: Diagnosis not present

## 2016-09-18 DIAGNOSIS — M25473 Effusion, unspecified ankle: Secondary | ICD-10-CM | POA: Diagnosis not present

## 2016-09-19 ENCOUNTER — Ambulatory Visit (HOSPITAL_COMMUNITY)
Admission: RE | Admit: 2016-09-19 | Discharge: 2016-09-19 | Disposition: A | Discharge status: Home / Self Care | Payer: Medicare HMO | Source: Ambulatory Visit | Attending: Internal Medicine | Admitting: Internal Medicine

## 2016-09-19 DIAGNOSIS — R600 Localized edema: Secondary | ICD-10-CM | POA: Diagnosis not present

## 2016-09-19 DIAGNOSIS — M7989 Other specified soft tissue disorders: Secondary | ICD-10-CM | POA: Diagnosis not present

## 2016-09-19 DIAGNOSIS — M79606 Pain in leg, unspecified: Secondary | ICD-10-CM | POA: Diagnosis not present

## 2016-09-19 NOTE — Progress Notes (Signed)
VASCULAR LAB PRELIMINARY  PRELIMINARY  PRELIMINARY  PRELIMINARY  Bilateral lower extremity venous duplex completed.    Preliminary report:  Bilateral:  No evidence of DVT, superficial thrombosis, or Baker's Cyst.   [name], RVS 09/19/2016, 11:11 AM

## 2016-09-21 DIAGNOSIS — K219 Gastro-esophageal reflux disease without esophagitis: Secondary | ICD-10-CM | POA: Diagnosis not present

## 2016-09-21 DIAGNOSIS — R600 Localized edema: Secondary | ICD-10-CM | POA: Diagnosis not present

## 2016-10-25 ENCOUNTER — Encounter: Payer: Self-pay | Admitting: Podiatry

## 2016-10-25 ENCOUNTER — Ambulatory Visit (INDEPENDENT_AMBULATORY_CARE_PROVIDER_SITE_OTHER): Payer: Medicare HMO | Admitting: Podiatry

## 2016-10-25 VITALS — BP 148/70 | HR 78 | Resp 18

## 2016-10-25 DIAGNOSIS — E1142 Type 2 diabetes mellitus with diabetic polyneuropathy: Secondary | ICD-10-CM | POA: Diagnosis not present

## 2016-10-25 DIAGNOSIS — B351 Tinea unguium: Secondary | ICD-10-CM

## 2016-10-25 NOTE — Patient Instructions (Signed)

## 2016-10-25 NOTE — Progress Notes (Signed)
   Subjective:    Patient ID: [name], female    DOB: [birth year], 82 y.o.   MRN: [id number]  HPI    This diabetic patient presents today requesting evaluation trimming of her toenails. Patient describes thickened toenails over long period of time. The last several months she has noticed this several nails have a darkened appearance within them as well as a history of sloughing. Patient states that she has visited a podiatrist previously, however, has not seen a podiatrist in 2018. Previous treatment per patient was treated 4 times a year. The patient's daughters present in the treatment room today Patient is a diabetic for approximately 2 years Denies history of foot ulceration, claudication or amputation Denies history of smoking  Review of Systems  HENT: Positive for hearing loss.   Respiratory: Positive for shortness of breath.   Hematological: Bruises/bleeds easily.  All other systems reviewed and are negative.      Objective:   Physical Exam  Orientated 3  Vascular: DP pulses 2/4 right 0/4 left PT pulses 2/4 bilaterally Capillary reflex immediate bilaterally  Neurological: Sensation to 10 g monofilament wire intact 5/5 bilaterally Vibratory sensation nonreactive bilaterally Ankle reflex reactive bilaterally  Dermatological No skin lesions bilaterally Atrophic skin without hair growth bilaterally Absent hallux nails bilaterally Second right toenail has dried blood within the nail plate without any surrounding erythema, edema or drainage The remaining nail plates have occasional texture and color changes  Musculoskeletal: Patient is able to ambulate slowly Manual motor testing dorsi flexion, plantar flexion, inversion, eversion 5/5 bilaterally       Assessment & Plan:   Assessment: Diabetic peripheral neuropathy Absent DP pulse left Mycotic toenails 8  Plan: I reviewed the results exam with patient today and recommend periodic debridement and  patient verbally consents. Toenails 8 were debrided mechanically and electrically without any bleeding  Reappoint 4 months

## 2016-10-31 DIAGNOSIS — H6123 Impacted cerumen, bilateral: Secondary | ICD-10-CM | POA: Diagnosis not present

## 2016-10-31 DIAGNOSIS — H60501 Unspecified acute noninfective otitis externa, right ear: Secondary | ICD-10-CM | POA: Diagnosis not present

## 2016-10-31 DIAGNOSIS — M1711 Unilateral primary osteoarthritis, right knee: Secondary | ICD-10-CM | POA: Diagnosis not present

## 2016-10-31 DIAGNOSIS — M1712 Unilateral primary osteoarthritis, left knee: Secondary | ICD-10-CM | POA: Diagnosis not present

## 2016-10-31 DIAGNOSIS — H903 Sensorineural hearing loss, bilateral: Secondary | ICD-10-CM | POA: Diagnosis not present

## 2016-11-09 DIAGNOSIS — H722X1 Other marginal perforations of tympanic membrane, right ear: Secondary | ICD-10-CM | POA: Diagnosis not present

## 2016-11-09 DIAGNOSIS — H6123 Impacted cerumen, bilateral: Secondary | ICD-10-CM | POA: Diagnosis not present

## 2016-11-14 DIAGNOSIS — E119 Type 2 diabetes mellitus without complications: Secondary | ICD-10-CM | POA: Diagnosis not present

## 2016-11-14 DIAGNOSIS — Z961 Presence of intraocular lens: Secondary | ICD-10-CM | POA: Diagnosis not present

## 2016-11-14 DIAGNOSIS — H43813 Vitreous degeneration, bilateral: Secondary | ICD-10-CM | POA: Diagnosis not present

## 2016-11-14 DIAGNOSIS — H40013 Open angle with borderline findings, low risk, bilateral: Secondary | ICD-10-CM | POA: Diagnosis not present

## 2016-11-14 DIAGNOSIS — H26493 Other secondary cataract, bilateral: Secondary | ICD-10-CM | POA: Diagnosis not present

## 2016-11-15 DIAGNOSIS — M8580 Other specified disorders of bone density and structure, unspecified site: Secondary | ICD-10-CM | POA: Diagnosis not present

## 2016-11-15 DIAGNOSIS — E039 Hypothyroidism, unspecified: Secondary | ICD-10-CM | POA: Diagnosis not present

## 2016-11-15 DIAGNOSIS — E119 Type 2 diabetes mellitus without complications: Secondary | ICD-10-CM | POA: Diagnosis not present

## 2016-11-15 DIAGNOSIS — I10 Essential (primary) hypertension: Secondary | ICD-10-CM | POA: Diagnosis not present

## 2016-11-15 DIAGNOSIS — M859 Disorder of bone density and structure, unspecified: Secondary | ICD-10-CM | POA: Diagnosis not present

## 2016-11-21 DIAGNOSIS — Z0000 Encounter for general adult medical examination without abnormal findings: Secondary | ICD-10-CM | POA: Diagnosis not present

## 2016-11-21 DIAGNOSIS — E039 Hypothyroidism, unspecified: Secondary | ICD-10-CM | POA: Diagnosis not present

## 2016-11-21 DIAGNOSIS — I2510 Atherosclerotic heart disease of native coronary artery without angina pectoris: Secondary | ICD-10-CM | POA: Diagnosis not present

## 2016-11-21 DIAGNOSIS — I10 Essential (primary) hypertension: Secondary | ICD-10-CM | POA: Diagnosis not present

## 2016-11-30 DIAGNOSIS — N3000 Acute cystitis without hematuria: Secondary | ICD-10-CM | POA: Diagnosis not present

## 2016-11-30 DIAGNOSIS — N3020 Other chronic cystitis without hematuria: Secondary | ICD-10-CM | POA: Diagnosis not present

## 2016-11-30 DIAGNOSIS — N390 Urinary tract infection, site not specified: Secondary | ICD-10-CM | POA: Diagnosis not present

## 2016-11-30 DIAGNOSIS — N3941 Urge incontinence: Secondary | ICD-10-CM | POA: Diagnosis not present

## 2016-12-14 DIAGNOSIS — R69 Illness, unspecified: Secondary | ICD-10-CM | POA: Diagnosis not present

## 2017-01-08 ENCOUNTER — Encounter: Payer: Self-pay | Admitting: *Deleted

## 2017-01-22 NOTE — Progress Notes (Signed)
Patient ID: [name], female   DOB: 1924/12/11, 83 y.o.   MRN: [id number]   [name]  who had delayed presentation of a large anterior MI on 07/07/11. Complicated by shock and VSD. Had CABG with SVG to Diagonal and LAD with VSD patch. Echo 4/16 reviewed and has EF 30-35% with small residual VSD. No thrombus. 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 Caused erosion of her gi mucosa and is now on sucralafate. She denies dyspnea, palpitations, chest pain. She has mild LE edema.   Husband died 5 years ago.   Daughter is very supportive. She still has her license and drives   Echo 59/16 reviewed  Study Conclusions  - Left ventricle: The cavity size was normal. There was mild focal basal hypertrophy of the septum. Systolic function was moderately to severely reduced. The estimated ejection fraction was in the range of 30% to 35%. There is akinesis of the mid-apicalanteroseptal and apical myocardium. Doppler parameters are consistent with abnormal left ventricular relaxation (grade 1 diastolic dysfunction). - Aortic valve: There was mild regurgitation. - Mitral valve: There was mild regurgitation.  Impressions:  - Extremely limited due to poor sound wave transmission; LV function difficult to quantitate as endocardium not well visualized; there appears to be anteroseptal and apical akinesis with significantly reduced LV function (?EF 30-35); grade 1 diastolic dysfunction; mild AI and MR. Compared to 09/19/11, distal muscular VSD is not well interrogated and not visualized; suggest TEE or cardiac MRI to further assess if clinically indicated.  Dyspnea improved   She does not really note more LE edema No recent CXR. Compliant with meds. No cough sputum Her husband with parkinsons finally passed in December. Her son who is a highly functioning schizophrenic lives with her and her daughter here today is  also very attentive.   03/05/14  BNP only 182  and Cr 1.4    Back surgery with Dr Ronnald Ramp uneventful  08/20/15 Left L4-5 hemilaminectomy, medial facetectomy, and foraminotomy followed by microdiscectomy  ROS: Denies fever, malais, weight loss, blurry vision, decreased visual acuity, cough, sputum, SOB, hemoptysis, pleuritic pain, palpitaitons, heartburn, abdominal pain, melena, lower extremity edema, claudication, or rash.  All other systems reviewed and negative  General: Affect appropriate Healthy:  appears stated age 29: normal Neck supple with no adenopathy JVP normal no bruits no thyromegaly Lungs clear with no wheezing and good diaphragmatic motion Heart:  S1/S2 SEM no VSD  murmur, no rub, gallop or click PMI normal Abdomen: benighn, BS positve, no tenderness, no AAA no bruit.  No HSM or HJR Distal pulses intact with no bruits No edema Neuro non-focal Skin warm and dry No muscular weakness   Current Outpatient Prescriptions  Medication Sig Dispense Refill  . aspirin EC 81 MG tablet Take 81 mg by mouth daily.    . carvedilol (COREG) 6.25 MG tablet     . furosemide (LASIX) 20 MG tablet     . Ibuprofen (ADVIL PO) Take 1 tablet by mouth daily.    Marland Kitchen levothyroxine (SYNTHROID, LEVOTHROID) 50 MCG tablet Take 25 mcg by mouth daily before breakfast.     . losartan (COZAAR) 100 MG tablet Take 100 mg by mouth daily.  2  . metFORMIN (GLUCOPHAGE) 500 MG tablet Take 250 mg by mouth 2 (two) times daily with a meal.     . omeprazole (PRILOSEC) 20 MG capsule     . oxybutynin (DITROPAN) 5 MG tablet Take 1  tablet by mouth 2 (two) times daily.    . simvastatin (ZOCOR) 40 MG tablet Take 40 mg by mouth every evening.    . carvedilol (COREG) 6.25 MG tablet Take 1 tablet (6.25 mg total) by mouth 2 (two) times daily with a meal. 180 tablet 3   No current facility-administered medications for this visit.     Allergies  Amoxapine and related and Clozapine  Electrocardiogram:  03/05/14  SR  rate 83  PR 240  Old anterolateral MI   04/26/15  SR rate 82  Old anterolateral MI  07/14/15 SR rate 24 old anterior infarct LAE no changes   Assessment and Plan CAD:  Stable with no angina and good activity level.  Continue medical Rx VSD:  Patch closure at time of CABG  07/2011  Echo with intact patch Thymoma:  In remission post XRT no palpable neck masses Chol:   Cholesterol is at goal.  Continue current dose of statin and diet Rx.  No myalgias or side effects.  F/U  LFT's in 6 months. Lab Results  Component Value Date   LDLCALC 15 07/08/2011   Edema:  Dependant venous continue low dose lasix CHF:  Euvolemic EF 35%  No AICD given age continue beta blocker, Ace and diuretic Lumbago:  Post laminectomy with good pain relief f/u Dr Vickie Epley

## 2017-01-25 ENCOUNTER — Encounter (INDEPENDENT_AMBULATORY_CARE_PROVIDER_SITE_OTHER): Payer: Self-pay

## 2017-01-25 ENCOUNTER — Ambulatory Visit (INDEPENDENT_AMBULATORY_CARE_PROVIDER_SITE_OTHER): Payer: Medicare HMO | Admitting: Cardiovascular Disease

## 2017-01-25 ENCOUNTER — Encounter: Payer: Self-pay | Admitting: Cardiovascular Disease

## 2017-01-25 VITALS — BP 110/58 | HR 78 | Ht 61.0 in | Wt 157.8 lb

## 2017-01-25 DIAGNOSIS — Z951 Presence of aortocoronary bypass graft: Secondary | ICD-10-CM | POA: Diagnosis not present

## 2017-01-25 NOTE — Patient Instructions (Signed)

## 2017-01-30 DIAGNOSIS — Z0100 Encounter for examination of eyes and vision without abnormal findings: Secondary | ICD-10-CM | POA: Diagnosis not present

## 2017-02-14 DIAGNOSIS — E118 Type 2 diabetes mellitus with unspecified complications: Secondary | ICD-10-CM | POA: Diagnosis not present

## 2017-02-14 DIAGNOSIS — I10 Essential (primary) hypertension: Secondary | ICD-10-CM | POA: Diagnosis not present

## 2017-02-20 ENCOUNTER — Ambulatory Visit: Payer: Medicare HMO | Admitting: Podiatry

## 2017-02-21 ENCOUNTER — Ambulatory Visit: Payer: Medicare HMO | Admitting: Podiatry

## 2017-02-22 DIAGNOSIS — E119 Type 2 diabetes mellitus without complications: Secondary | ICD-10-CM | POA: Diagnosis not present

## 2017-02-22 DIAGNOSIS — I10 Essential (primary) hypertension: Secondary | ICD-10-CM | POA: Diagnosis not present

## 2017-02-22 DIAGNOSIS — G8929 Other chronic pain: Secondary | ICD-10-CM | POA: Diagnosis not present

## 2017-02-22 DIAGNOSIS — E7800 Pure hypercholesterolemia, unspecified: Secondary | ICD-10-CM | POA: Diagnosis not present

## 2017-02-22 DIAGNOSIS — I2510 Atherosclerotic heart disease of native coronary artery without angina pectoris: Secondary | ICD-10-CM | POA: Diagnosis not present

## 2017-02-22 DIAGNOSIS — G4700 Insomnia, unspecified: Secondary | ICD-10-CM | POA: Diagnosis not present

## 2017-02-22 DIAGNOSIS — M25561 Pain in right knee: Secondary | ICD-10-CM | POA: Diagnosis not present

## 2017-02-22 DIAGNOSIS — M25562 Pain in left knee: Secondary | ICD-10-CM | POA: Diagnosis not present

## 2017-03-01 ENCOUNTER — Emergency Department (HOSPITAL_COMMUNITY): Payer: Medicare HMO

## 2017-03-01 ENCOUNTER — Encounter (HOSPITAL_COMMUNITY): Payer: Self-pay | Admitting: Emergency Medicine

## 2017-03-01 ENCOUNTER — Emergency Department (HOSPITAL_COMMUNITY)
Admission: EM | Admit: 2017-03-01 | Discharge: 2017-03-01 | Disposition: A | Discharge status: Home / Self Care | Payer: Medicare HMO | Attending: Emergency Medicine | Admitting: Emergency Medicine

## 2017-03-01 DIAGNOSIS — Z85238 Personal history of other malignant neoplasm of thymus: Secondary | ICD-10-CM | POA: Diagnosis not present

## 2017-03-01 DIAGNOSIS — I2510 Atherosclerotic heart disease of native coronary artery without angina pectoris: Secondary | ICD-10-CM | POA: Diagnosis not present

## 2017-03-01 DIAGNOSIS — Z951 Presence of aortocoronary bypass graft: Secondary | ICD-10-CM | POA: Diagnosis not present

## 2017-03-01 DIAGNOSIS — I10 Essential (primary) hypertension: Secondary | ICD-10-CM | POA: Insufficient documentation

## 2017-03-01 DIAGNOSIS — Z7982 Long term (current) use of aspirin: Secondary | ICD-10-CM | POA: Diagnosis not present

## 2017-03-01 DIAGNOSIS — E039 Hypothyroidism, unspecified: Secondary | ICD-10-CM | POA: Insufficient documentation

## 2017-03-01 DIAGNOSIS — Z96652 Presence of left artificial knee joint: Secondary | ICD-10-CM | POA: Insufficient documentation

## 2017-03-01 DIAGNOSIS — R55 Syncope and collapse: Secondary | ICD-10-CM | POA: Insufficient documentation

## 2017-03-01 DIAGNOSIS — R569 Unspecified convulsions: Secondary | ICD-10-CM | POA: Diagnosis not present

## 2017-03-01 DIAGNOSIS — Z79899 Other long term (current) drug therapy: Secondary | ICD-10-CM | POA: Insufficient documentation

## 2017-03-01 LAB — CBC
HCT: 39.3 % (ref 36.0–46.0)
Hemoglobin: 13 g/dL (ref 12.0–15.0)
MCH: 28.1 pg (ref 26.0–34.0)
MCHC: 33.1 g/dL (ref 30.0–36.0)
MCV: 85.1 fL (ref 78.0–100.0)
PLATELETS: 178 10*3/uL (ref 150–400)
RBC: 4.62 MIL/uL (ref 3.87–5.11)
RDW: 14 % (ref 11.5–15.5)
WBC: 10.9 10*3/uL — AB (ref 4.0–10.5)

## 2017-03-01 LAB — BASIC METABOLIC PANEL
Anion gap: 10 (ref 5–15)
BUN: 41 mg/dL — ABNORMAL HIGH (ref 6–20)
CHLORIDE: 109 mmol/L (ref 101–111)
CO2: 20 mmol/L — ABNORMAL LOW (ref 22–32)
CREATININE: 1.54 mg/dL — AB (ref 0.44–1.00)
Calcium: 9.5 mg/dL (ref 8.9–10.3)
GFR, EST AFRICAN AMERICAN: 33 mL/min — AB (ref >60)
GFR, EST NON AFRICAN AMERICAN: 29 mL/min — AB (ref >60)
Glucose, Bld: 131 mg/dL — ABNORMAL HIGH (ref 65–99)
Potassium: 4 mmol/L (ref 3.5–5.1)
SODIUM: 139 mmol/L (ref 135–145)

## 2017-03-01 LAB — CBG MONITORING, ED: GLUCOSE-CAPILLARY: 105 mg/dL — AB (ref 65–99)

## 2017-03-01 MED ORDER — SODIUM CHLORIDE 0.9 % IV BOLUS (SEPSIS)
500.0000 mL | Freq: Once | INTRAVENOUS | Status: AC
  Administered 2017-03-01: 500 mL via INTRAVENOUS

## 2017-03-01 MED ORDER — ONDANSETRON HCL 4 MG/2ML IJ SOLN
4.0000 mg | Freq: Once | INTRAMUSCULAR | Status: AC | PRN
  Administered 2017-03-01: 4 mg via INTRAVENOUS
  Filled 2017-03-01: qty 2

## 2017-03-01 NOTE — ED Triage Notes (Signed)
Patient from eye doctor with [facility] after syncopal episode.  Patient was seated in chair when she "slumped over" but did not fall out of the chair.  EMS states staff reports patient was unresponsive for 1-2 minutes, then woke up suddenly and "threw her arms in the air and vomited".  Patient is alert and oriented at this time and in no apparent distress.  States that she had one episode of diarrhea this morning.  Denies any pain or nausea at this time.

## 2017-03-01 NOTE — ED Provider Notes (Signed)
[facility] DEPT Provider Note   CSN: [id number] Arrival date & time: 03/01/17  1335     History   Chief Complaint Chief Complaint  Patient presents with  . Loss of Consciousness    HPI [name] is a 83 y.o. female.  HPI  Patient presents after an episode of syncope. Patient is here with her daughter. Patient had a brief episode of syncope just prior to calling EMS. Patient had a quick return to baseline interactivity, and no chest pain before or after the event. However, she seemed had several episodes of nausea with vomiting. She and her daughter note that the patient missed her breakfast this morning, otherwise has had no recent changes in behavior, diet, medication.    Past Medical History:  Diagnosis Date  . Acute MI, anterior wall ([place])    a. 07/07/2011  . CAD (coronary artery disease)    a. 07/07/2011 anterior MI- Occluded LAD w/ emergent CABG x 2 (SVG->LAD->D1).  . 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 Radiation initiated 09/2011  . Hemorrhoid   . History of radiation therapy 09/05/11-10/12/11   thyoma  . HTN (hypertension)   . Hypothyroidism   . Ischemic cardiomyopathy    a. 07/17/11 Echo: EF 35%;  b. 09/19/2011 Echo: EF 30-35%, Mid-Dist AntSept & Apical AK, mild MR.  Small residual VSD.  Marland Kitchen Osteoarthritis of knee    Bilateral.  a. s/p LTKA ~2007;  b. s/p RTKA 10/2010  . Seasonal allergies   . Shortness of breath    recently SOB  . VSD (ventricular septal defect)    a.  07/07/2011 - Noted @ time of MI - s/p repair @ time of CABG;  b. 09/19/2011 Echo: small residula VSD (present on 2/11 echo also)    Patient Active Problem List   Diagnosis Date Noted  . S/P lumbar laminectomy 08/19/2015  . Ischemic cardiomyopathy   . CHF (congestive heart failure) ([place]) 08/18/2011  . Pleural effusion 08/18/2011  . Cellulitis 08/18/2011  . Dyspnea 08/17/2011  . 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 Physical  deconditioning 07/19/2011  . Cancer ([place]) 07/08/2011  . Acute anterior wall MI ([place]) 07/07/2011  . Myocardial infarction ([place]) 07/07/2011  . HTN (hypertension)   . Osteoarthritis of knee   . CAD (coronary artery disease)   . VSD (ventricular septal defect)     Past Surgical History:  Procedure Laterality Date  . ABDOMINAL HYSTERECTOMY    . CATARACT EXTRACTION, BILATERAL    . CORONARY ARTERY BYPASS GRAFT  07/07/2011   Procedure: CORONARY ARTERY BYPASS GRAFTING (CABG);  Surgeon: Tharon Aquas Adelene Idler, MD;  Location: [place];  Service: Open Heart Surgery;  Laterality: N/A;  times two using greater saphenous vein harvested via endovascular vein harvest  . EYE SURGERY Bilateral    cataract  . HEMORRHOID SURGERY    . JOINT REPLACEMENT    . LEFT HEART CATHETERIZATION WITH CORONARY ANGIOGRAM N/A 07/07/2011   Procedure: LEFT HEART CATHETERIZATION WITH CORONARY ANGIOGRAM;  Surgeon: Josue Hector, MD;  Location[facility] C[facility] LAB;  Service: Cardiovascular;  Laterality: N/A;  . LUMBAR LAMINECTOMY/DECOMPRESSION MICRODISCECTOMY Left 08/19/2015   Procedure: Left Lumbar four-five Microdiskectomy;  Surgeon: Eustace Moore, MD;  Location: [place] NEURO ORS;  Service: Neurosurgery;  Laterality: Left;  left  . MASS EXCISION  07/07/2011   Procedure: EXCISION MASS;  Surgeon: Tharon Aquas Adelene Idler, MD;  Location: [place];  Service: Open Heart Surgery;  Laterality:  N/A;  Excision of mediastinal mass  . MYRINGOTOMY WITH TUBE PLACEMENT Right 10/24/2012   Procedure: MYRINGOTOMY WITH TUBE PLACEMENT;  Surgeon: Melissa Montane, MD;  Location: [place];  Service: ENT;  Laterality: Right;  . NASAL ENDOSCOPY WITH EPISTAXIS CONTROL N/A 10/24/2012   Procedure: NASAL ENDOSCOPY WITH EPISTAXIS CONTROL;  Surgeon: Melissa Montane, MD;  Location: [place];  Service: ENT;  Laterality: N/A;  nasal endoscopy without epistaxis control... only need 30 minutes   . RIGHT HEART CATHETERIZATION  07/07/2011   Procedure: RIGHT HEART CATH;  Surgeon: Josue Hector, MD;  Location: [facility]  CATH LAB;  Service: Cardiovascular;;  . TONSILLECTOMY    . TOTAL KNEE ARTHROPLASTY     a.  Left ~ 2007, Right 10/2010  . VSD REPAIR  07/07/2011   Procedure: VENTRICULAR SEPTAL DEFECT (VSD) REPAIR;  Surgeon: Len Childs, MD;  Location: [place];  Service: Open Heart Surgery;  Laterality: N/A;  with bovine pericardium 4cm x 4cm    OB History    No data available       Home Medications    Prior to Admission medications   Medication Sig Start Date End Date Taking? Authorizing Provider  aspirin EC 81 MG tablet Take 81 mg by mouth daily.    [provider]  carvedilol (COREG) 6.25 MG tablet Take 1 tablet (6.25 mg total) by mouth 2 (two) times daily with a meal. 09/22/11 07/13/16  Rogelia Mire, NP  carvedilol (COREG) 6.25 MG tablet  10/03/16   [provider]  furosemide (LASIX) 20 MG tablet  09/18/16   [provider]  Ibuprofen (ADVIL PO) Take 1 tablet by mouth daily.    [provider]  levothyroxine (SYNTHROID, LEVOTHROID) 50 MCG tablet Take 25 mcg by mouth daily before breakfast.     [provider]  losartan (COZAAR) 100 MG tablet Take 100 mg by mouth daily. 10/26/14   [provider]  metFORMIN (GLUCOPHAGE) 500 MG tablet Take 250 mg by mouth 2 (two) times daily with a meal.  02/07/14   [provider]  omeprazole (PRILOSEC) 20 MG capsule  10/19/16   [provider]  oxybutynin (DITROPAN) 5 MG tablet Take 1 tablet by mouth 2 (two) times daily. 06/30/16   [provider]  simvastatin (ZOCOR) 40 MG tablet Take 40 mg by mouth every evening.    [provider]    Family History Family History  Problem Relation Age of Onset  . Heart disease Mother     Social History Social History  Substance Use Topics  . Smoking status: Never Smoker  . Smokeless tobacco: Never Used  . Alcohol use No     Allergies   Amoxapine and related and Clozapine   Review of Systems Review of Systems    Constitutional:       Per HPI, otherwise negative  HENT:       Per HPI, otherwise negative  Respiratory:       Per HPI, otherwise negative  Cardiovascular:       Per HPI, otherwise negative  Gastrointestinal: Positive for nausea and vomiting.  Endocrine:       Negative aside from HPI  Genitourinary:       Neg aside from HPI   Musculoskeletal:       Per HPI, otherwise negative  Skin: Negative.   Neurological: Positive for syncope.  Hematological: Negative.      Physical Exam Updated Vital Signs BP (!) 114/50   Pulse  86   Temp 97.9 F (36.6 C) (Oral)   Resp 16   SpO2 98%   Physical Exam  Constitutional: She is oriented to person, place, and time. She appears well-developed and well-nourished. No distress.  HENT:  Head: Normocephalic and atraumatic.  Eyes: Conjunctivae and EOM are normal.  Cardiovascular: Normal rate and regular rhythm.   Pulmonary/Chest: Effort normal and breath sounds normal. No stridor. No respiratory distress.  Abdominal: She exhibits no distension. There is no tenderness.  Musculoskeletal: She exhibits no edema.  Neurological: She is alert and oriented to person, place, and time. No cranial nerve deficit.  Skin: Skin is warm and dry.  Psychiatric: She has a normal mood and affect.  Nursing note and vitals reviewed.    ED Treatments / Results  Labs (all labs ordered are listed, but only abnormal results are displayed) Labs Reviewed  BASIC METABOLIC PANEL - Abnormal; Notable for the following:       Result Value   CO2 20 (*)    Glucose, Bld 131 (*)    BUN 41 (*)    Creatinine, Ser 1.54 (*)    GFR calc non Af Amer 29 (*)    GFR calc Af Amer 33 (*)    All other components within normal limits  CBC - Abnormal; Notable for the following:    WBC 10.9 (*)    All other components within normal limits  CBG MONITORING, ED - Abnormal; Notable for the following:    Glucose-Capillary 105 (*)    All other components within normal limits   URINALYSIS, ROUTINE W REFLEX MICROSCOPIC    EKG  EKG Interpretation  Date/Time:  Thursday March 01 2017 13:43:33 EDT Ventricular Rate:  80 PR Interval:    QRS Duration: 81 QT Interval:  397 QTC Calculation: 458 R Axis:   11 Text Interpretation:  Sinus rhythm Prolonged PR interval Probable left atrial enlargement Probable anterolateral infarct, age indeterm ST-t wave abnormality Abnormal ekg Confirmed by Carmin Muskrat [phone]) on 03/01/2017 2:16:43 PM       Radiology Dg Chest Port 1 View  Result Date: 03/01/2017 CLINICAL DATA:  Syncope EXAM: PORTABLE CHEST 1 VIEW COMPARISON:  Chest x-ray of 08/17/2015 FINDINGS: Minimal scarring remains at the right lung base. No active infiltrate or definite effusion is seen, with minimal blunting of the right costophrenic angle again noted. A small right effusion cannot be excluded although this blunting may be due to pleural thickening. Mediastinal and hilar contours are unremarkable. The heart is within upper limits of normal. Median sternotomy sutures remain. IMPRESSION: Probable chronic change at the right lung base. No definite active process. Electronically Signed   By: Ivar Drape M.D.   On: 03/01/2017 15:01    Procedures Procedures (including critical care time)  Medications Ordered in ED Medications  sodium chloride 0.9 % bolus 500 mL (not administered)  ondansetron (ZOFRAN) injection 4 mg (4 mg Intravenous Given 03/01/17 1403)   4:40 PM Patient awake and alert, smiling. No additional vomiting. We discussed all findings including some evidence for dehydration, otherwise reassuring labs, with no evidence for ongoing coronary ischemia, no pneumonia, no infection. Given the reassuring evaluation, and absence of ongoing complaints, patient will follow up with her cardiologist tomorrow, primary care as well.   Initial Impression / Assessment and Plan / ED Course  I have reviewed the triage vital signs and the nursing  notes.  Pertinent labs & imaging results that were available during my care of the patient were reviewed by  me and considered in my medical decision making (see chart for details).    Final Clinical Impressions(s) / ED Diagnoses   Final diagnoses:  Syncope and collapse     Carmin Muskrat, MD 03/01/17 1640

## 2017-03-01 NOTE — Discharge Instructions (Signed)
As discussed, your evaluation today has been largely reassuring.  But, it is important that you monitor your condition carefully, and do not hesitate to return to the ED if you develop new, or concerning changes in your condition. ? ?Otherwise, please follow-up with your physician for appropriate ongoing care. ? ?

## 2017-03-06 ENCOUNTER — Encounter: Payer: Self-pay | Admitting: Podiatry

## 2017-03-06 ENCOUNTER — Ambulatory Visit (INDEPENDENT_AMBULATORY_CARE_PROVIDER_SITE_OTHER): Payer: Medicare HMO | Admitting: Podiatry

## 2017-03-06 DIAGNOSIS — B351 Tinea unguium: Secondary | ICD-10-CM | POA: Diagnosis not present

## 2017-03-06 DIAGNOSIS — E1142 Type 2 diabetes mellitus with diabetic polyneuropathy: Secondary | ICD-10-CM

## 2017-03-06 NOTE — Progress Notes (Signed)
Patient ID: [name], female   DOB: [birth year], 83 y.o.   MRN: [id number]   Subjective:  This patient presents today requesting evaluation trimming of her toenails. Patient states that the toenails are uncomfortable the, elongated thick and rub against his shoes when walking and standing Patient is a diabetic for approximately 2 years Denies history of foot ulceration, claudication or amputation Denies history of smoking Patient's daughter is present treatment room today  Objective:  Orientated 3  Vascular: DP pulses 2/4 right 0/4 left PT pulses 2/4 bilaterally Capillary reflex immediate bilaterally  Neurological: Sensation to 10 g monofilament wire intact 5/5 bilaterally Vibratory sensation nonreactive bilaterally Ankle reflex reactive bilaterally  Dermatological No skin lesions bilaterally Atrophic skin without hair growth bilaterally Absent hallux nails bilaterally Second right toenail has dried blood within the nail plate without any surrounding erythema, edema or drainage The remaining nail plates have occasional texture and color changes  Musculoskeletal: Patient is able to ambulate slowly Manual motor testing dorsi flexion, plantar flexion, inversion, eversion 5/5 bilaterally  Assessment: Diabetic peripheral neuropathy Absent DP pulse left Mycotic toenails 8  Plan: Debridement toenails 8 mechanically and electrically with slight bleeding distal fifth left toe treated with topical antibiotic ointment and Band-Aid. Patient instructed removed Band-Aid 1-3 days and continue applying topical antibiotic ointment and Band-Aid daily until a scab forms  Reappoint 4 months

## 2017-03-06 NOTE — Patient Instructions (Signed)
Removed Band-Aid on fifth left toe in 1-3 days and continue to apply topical antibiotic ointment and Band-Aid daily until a scab forms  Diabetes and Foot Care Diabetes may cause you to have problems because of poor blood supply (circulation) to your feet and legs. This may cause the skin on your feet to become thinner, break easier, and heal more slowly. Your skin may become dry, and the skin may peel and crack. You may also have nerve damage in your legs and feet causing decreased feeling in them. You may not notice minor injuries to your feet that could lead to infections or more serious problems. Taking care of your feet is one of the most important things you can do for yourself. Follow these instructions at home:  Wear shoes at all times, even in the house. Do not go barefoot. Bare feet are easily injured.  Check your feet daily for blisters, cuts, and redness. If you cannot see the bottom of your feet, use a mirror or ask someone for help.  Wash your feet with warm water (do not use hot water) and mild soap. Then pat your feet and the areas between your toes until they are completely dry. Do not soak your feet as this can dry your skin.  Apply a moisturizing lotion or petroleum jelly (that does not contain alcohol and is unscented) to the skin on your feet and to dry, brittle toenails. Do not apply lotion between your toes.  Trim your toenails straight across. Do not dig under them or around the cuticle. File the edges of your nails with an emery board or nail file.  Do not cut corns or calluses or try to remove them with medicine.  Wear clean socks or stockings every day. Make sure they are not too tight. Do not wear knee-high stockings since they may decrease blood flow to your legs.  Wear shoes that fit properly and have enough cushioning. To break in new shoes, wear them for just a few hours a day. This prevents you from injuring your feet. Always look in your shoes before you put them  on to be sure there are no objects inside.  Do not cross your legs. This may decrease the blood flow to your feet.  If you find a minor scrape, cut, or break in the skin on your feet, keep it and the skin around it clean and dry. These areas may be cleansed with mild soap and water. Do not cleanse the area with peroxide, alcohol, or iodine.  When you remove an adhesive bandage, be sure not to damage the skin around it.  If you have a wound, look at it several times a day to make sure it is healing.  Do not use heating pads or hot water bottles. They may burn your skin. If you have lost feeling in your feet or legs, you may not know it is happening until it is too late.  Make sure your health care provider performs a complete foot exam at least annually or more often if you have foot problems. Report any cuts, sores, or bruises to your health care provider immediately. Contact a health care provider if:  You have an injury that is not healing.  You have cuts or breaks in the skin.  You have an ingrown nail.  You notice redness on your legs or feet.  You feel burning or tingling in your legs or feet.  You have pain or cramps in your legs  and feet.  Your legs or feet are numb.  Your feet always feel cold. Get help right away if:  There is increasing redness, swelling, or pain in or around a wound.  There is a red line that goes up your leg.  Pus is coming from a wound.  You develop a fever or as directed by your health care provider.  You notice a bad smell coming from an ulcer or wound. This information is not intended to replace advice given to you by your health care provider. Make sure you discuss any questions you have with your health care provider. Document Released: 05/19/2000 Document Revised: 10/28/2015 Document Reviewed: 10/29/2012 Elsevier Interactive Patient Education  2017 [employer].

## 2017-03-11 DIAGNOSIS — R69 Illness, unspecified: Secondary | ICD-10-CM | POA: Diagnosis not present

## 2017-03-13 DIAGNOSIS — E8770 Fluid overload, unspecified: Secondary | ICD-10-CM | POA: Diagnosis not present

## 2017-03-13 DIAGNOSIS — K219 Gastro-esophageal reflux disease without esophagitis: Secondary | ICD-10-CM | POA: Diagnosis not present

## 2017-03-13 DIAGNOSIS — E119 Type 2 diabetes mellitus without complications: Secondary | ICD-10-CM | POA: Diagnosis not present

## 2017-03-13 DIAGNOSIS — Z0000 Encounter for general adult medical examination without abnormal findings: Secondary | ICD-10-CM | POA: Diagnosis not present

## 2017-03-13 DIAGNOSIS — E7800 Pure hypercholesterolemia, unspecified: Secondary | ICD-10-CM | POA: Diagnosis not present

## 2017-03-13 DIAGNOSIS — E0500 Thyrotoxicosis with diffuse goiter without thyrotoxic crisis or storm: Secondary | ICD-10-CM | POA: Diagnosis not present

## 2017-03-13 DIAGNOSIS — I10 Essential (primary) hypertension: Secondary | ICD-10-CM | POA: Diagnosis not present

## 2017-03-13 DIAGNOSIS — N3281 Overactive bladder: Secondary | ICD-10-CM | POA: Diagnosis not present

## 2017-03-13 DIAGNOSIS — Z7982 Long term (current) use of aspirin: Secondary | ICD-10-CM | POA: Diagnosis not present

## 2017-03-13 DIAGNOSIS — Z6829 Body mass index (BMI) 29.0-29.9, adult: Secondary | ICD-10-CM | POA: Diagnosis not present

## 2017-04-07 ENCOUNTER — Emergency Department (HOSPITAL_COMMUNITY): Payer: Medicare HMO

## 2017-04-07 ENCOUNTER — Emergency Department (HOSPITAL_COMMUNITY)
Admission: EM | Admit: 2017-04-07 | Discharge: 2017-04-07 | Disposition: A | Discharge status: Home / Self Care | Payer: Medicare HMO | Attending: Emergency Medicine | Admitting: Emergency Medicine

## 2017-04-07 ENCOUNTER — Encounter (HOSPITAL_COMMUNITY): Payer: Self-pay

## 2017-04-07 DIAGNOSIS — Z7982 Long term (current) use of aspirin: Secondary | ICD-10-CM | POA: Diagnosis not present

## 2017-04-07 DIAGNOSIS — Y990 Civilian activity done for income or pay: Secondary | ICD-10-CM | POA: Insufficient documentation

## 2017-04-07 DIAGNOSIS — W01198A Fall on same level from slipping, tripping and stumbling with subsequent striking against other object, initial encounter: Secondary | ICD-10-CM | POA: Insufficient documentation

## 2017-04-07 DIAGNOSIS — Z96653 Presence of artificial knee joint, bilateral: Secondary | ICD-10-CM | POA: Insufficient documentation

## 2017-04-07 DIAGNOSIS — S61031A Puncture wound without foreign body of right thumb without damage to nail, initial encounter: Secondary | ICD-10-CM | POA: Diagnosis not present

## 2017-04-07 DIAGNOSIS — Y929 Unspecified place or not applicable: Secondary | ICD-10-CM | POA: Diagnosis not present

## 2017-04-07 DIAGNOSIS — T148XXA Other injury of unspecified body region, initial encounter: Secondary | ICD-10-CM

## 2017-04-07 DIAGNOSIS — Y939 Activity, unspecified: Secondary | ICD-10-CM | POA: Insufficient documentation

## 2017-04-07 DIAGNOSIS — I509 Heart failure, unspecified: Secondary | ICD-10-CM | POA: Insufficient documentation

## 2017-04-07 DIAGNOSIS — E039 Hypothyroidism, unspecified: Secondary | ICD-10-CM | POA: Diagnosis not present

## 2017-04-07 DIAGNOSIS — I110 Hypertensive heart disease with heart failure: Secondary | ICD-10-CM | POA: Diagnosis not present

## 2017-04-07 DIAGNOSIS — S63114A Dislocation of metacarpophalangeal joint of right thumb, initial encounter: Secondary | ICD-10-CM | POA: Insufficient documentation

## 2017-04-07 DIAGNOSIS — Z79899 Other long term (current) drug therapy: Secondary | ICD-10-CM | POA: Diagnosis not present

## 2017-04-07 DIAGNOSIS — R42 Dizziness and giddiness: Secondary | ICD-10-CM | POA: Diagnosis not present

## 2017-04-07 DIAGNOSIS — I2510 Atherosclerotic heart disease of native coronary artery without angina pectoris: Secondary | ICD-10-CM | POA: Diagnosis not present

## 2017-04-07 DIAGNOSIS — R9431 Abnormal electrocardiogram [ECG] [EKG]: Secondary | ICD-10-CM | POA: Diagnosis not present

## 2017-04-07 DIAGNOSIS — R404 Transient alteration of awareness: Secondary | ICD-10-CM | POA: Diagnosis not present

## 2017-04-07 DIAGNOSIS — S63259A Unspecified dislocation of unspecified finger, initial encounter: Secondary | ICD-10-CM

## 2017-04-07 LAB — CBC WITH DIFFERENTIAL/PLATELET
Basophils Absolute: 0 10*3/uL (ref 0.0–0.1)
Basophils Relative: 0 %
EOS PCT: 10 %
Eosinophils Absolute: 1.3 10*3/uL — ABNORMAL HIGH (ref 0.0–0.7)
HEMATOCRIT: 36.4 % (ref 36.0–46.0)
Hemoglobin: 12.2 g/dL (ref 12.0–15.0)
LYMPHS ABS: 0.6 10*3/uL — AB (ref 0.7–4.0)
LYMPHS PCT: 5 %
MCH: 28.8 pg (ref 26.0–34.0)
MCHC: 33.5 g/dL (ref 30.0–36.0)
MCV: 85.8 fL (ref 78.0–100.0)
MONO ABS: 0.9 10*3/uL (ref 0.1–1.0)
MONOS PCT: 6 %
NEUTROS ABS: 10.9 10*3/uL — AB (ref 1.7–7.7)
Neutrophils Relative %: 79 %
PLATELETS: 242 10*3/uL (ref 150–400)
RBC: 4.24 MIL/uL (ref 3.87–5.11)
RDW: 14.9 % (ref 11.5–15.5)
WBC: 13.7 10*3/uL — ABNORMAL HIGH (ref 4.0–10.5)

## 2017-04-07 LAB — BASIC METABOLIC PANEL
Anion gap: 9 (ref 5–15)
BUN: 39 mg/dL — AB (ref 6–20)
CHLORIDE: 110 mmol/L (ref 101–111)
CO2: 21 mmol/L — ABNORMAL LOW (ref 22–32)
CREATININE: 1.45 mg/dL — AB (ref 0.44–1.00)
Calcium: 9.8 mg/dL (ref 8.9–10.3)
GFR calc Af Amer: 36 mL/min — ABNORMAL LOW (ref >60)
GFR calc non Af Amer: 31 mL/min — ABNORMAL LOW (ref >60)
Glucose, Bld: 149 mg/dL — ABNORMAL HIGH (ref 65–99)
Potassium: 4.5 mmol/L (ref 3.5–5.1)
SODIUM: 140 mmol/L (ref 135–145)

## 2017-04-07 MED ORDER — SULFAMETHOXAZOLE-TRIMETHOPRIM 800-160 MG PO TABS: 1.0000 | ORAL_TABLET | Freq: Two times a day (BID) | ORAL | 0 refills | Status: AC

## 2017-04-07 MED ORDER — TETANUS-DIPHTH-ACELL PERTUSSIS 5-2.5-18.5 LF-MCG/0.5 IM SUSP
0.5000 mL | Freq: Once | INTRAMUSCULAR | Status: AC
Start: 2017-04-07 — End: 2017-04-07
  Administered 2017-04-07: 0.5 mL via INTRAMUSCULAR
  Filled 2017-04-07: qty 0.5

## 2017-04-07 MED ORDER — ONDANSETRON HCL 4 MG/2ML IJ SOLN
4.0000 mg | Freq: Once | INTRAMUSCULAR | Status: AC
  Administered 2017-04-07: 4 mg via INTRAVENOUS
  Filled 2017-04-07: qty 2

## 2017-04-07 MED ORDER — HYDROCODONE-ACETAMINOPHEN 5-325 MG PO TABS: ORAL_TABLET | ORAL | 0 refills | Status: DC

## 2017-04-07 MED ORDER — MORPHINE SULFATE (PF) 4 MG/ML IV SOLN
4.0000 mg | Freq: Once | INTRAVENOUS | Status: AC
  Administered 2017-04-07: 4 mg via INTRAVENOUS
  Filled 2017-04-07: qty 1

## 2017-04-07 MED ORDER — CEFAZOLIN SODIUM-DEXTROSE 2-4 GM/100ML-% IV SOLN
2.0000 g | Freq: Once | INTRAVENOUS | Status: AC
  Administered 2017-04-07: 2 g via INTRAVENOUS
  Filled 2017-04-07: qty 100

## 2017-04-07 NOTE — ED Triage Notes (Signed)
Bending down to get in box and got dizzy and fell and landed on right hand and now right thumb pain and deformity noted no other complaints of pain voiced.

## 2017-04-07 NOTE — ED Notes (Signed)
Bed: HY07 Expected date:  Expected time:  Means of arrival:  Comments: 71f fall

## 2017-04-07 NOTE — Discharge Instructions (Signed)
Please make an appointment for follow-up with a hand surgeon, Dr. Samuella Bruin in 7 days.  Please call and make an appointment with the hand surgeon, Dr. Amedeo Plenty in approximately 1 week.  Take vicodin for breakthrough pain, do not drink alcohol, drive, care for children or do other critical tasks while taking vicodin.  Please be very careful not to fall! The pain medication  puts you at risk for falls. Please rest as much as possible and try to not stay alone.   Wash the affected area with soap and water and apply a thin layer of topical antibiotic ointment. Do this every 12 hours.   Do not use rubbing alcohol or hydrogen peroxide.                        Look for signs of infection: if you see redness, if the area becomes warm, if pain increases sharply, there is discharge (pus), if red streaks appear or you develop fever or vomiting, RETURN immediately to the Emergency Department  for a recheck.   If you see signs of infection (warmth, redness, tenderness, pus, sharp increase in pain, fever, red streaking) immediately return to the emergency department.  Please follow with your primary care doctor in the next 2 days for a check-up. They must obtain records for further management.   Do not hesitate to return to the Emergency Department for any new, worsening or concerning symptoms.

## 2017-04-07 NOTE — ED Provider Notes (Signed)
[facility] DEPT Provider Note   CSN: [id number] Arrival date & time: 04/07/17  1930     History   Chief Complaint Chief Complaint  Patient presents with  . Fall     HPI   [name] is a 83 y.o. female complaining of right thumb pain after falling just prior to arrival.  Patient states she was bending over to pick up an object she felt very dizzy she was low to the ground, the hand impacted the ground.  There was no head trauma, she is not anticoagulated.  Patient is right-hand dominant, no neck pain, chest pain, abdominal pain.  Last tetanus shot is unknown.  Past Medical History:  Diagnosis Date  . Acute MI, anterior wall ([place])    a. 07/07/2011  . CAD (coronary artery disease)    a. 07/07/2011 anterior MI- Occluded LAD w/ emergent CABG x 2 (SVG->LAD->D1).  . 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 Radiation initiated 09/2011  . Hemorrhoid   . History of radiation therapy 09/05/11-10/12/11   thyoma  . HTN (hypertension)   . Hypothyroidism   . Ischemic cardiomyopathy    a. 07/17/11 Echo: EF 35%;  b. 09/19/2011 Echo: EF 30-35%, Mid-Dist AntSept & Apical AK, mild MR.  Small residual VSD.  Marland Kitchen Osteoarthritis of knee    Bilateral.  a. s/p LTKA ~2007;  b. s/p RTKA 10/2010  . Seasonal allergies   . Shortness of breath    recently SOB  . VSD (ventricular septal defect)    a.  07/07/2011 - Noted @ time of MI - s/p repair @ time of CABG;  b. 09/19/2011 Echo: small residula VSD (present on 2/11 echo also)    Patient Active Problem List   Diagnosis Date Noted  . S/P lumbar laminectomy 08/19/2015  . Ischemic cardiomyopathy   . CHF (congestive heart failure) ([place]) 08/18/2011  . Pleural effusion 08/18/2011  . Cellulitis 08/18/2011  . Dyspnea 08/17/2011  . 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 Physical deconditioning 07/19/2011  . Cancer ([place]) 07/08/2011  . Acute anterior wall MI ([place])  07/07/2011  . Myocardial infarction ([place]) 07/07/2011  . HTN (hypertension)   . Osteoarthritis of knee   . CAD (coronary artery disease)   . VSD (ventricular septal defect)     Past Surgical History:  Procedure Laterality Date  . ABDOMINAL HYSTERECTOMY    . CATARACT EXTRACTION, BILATERAL    . CORONARY ARTERY BYPASS GRAFT  07/07/2011   Procedure: CORONARY ARTERY BYPASS GRAFTING (CABG);  Surgeon: Tharon Aquas Adelene Idler, MD;  Location: [facility];  Service: Open Heart Surgery;  Laterality: N/A;  times two using greater saphenous vein harvested via endovascular vein harvest  . EYE SURGERY Bilateral    cataract  . HEMORRHOID SURGERY    . JOINT REPLACEMENT    . LEFT HEART CATHETERIZATION WITH CORONARY ANGIOGRAM N/A 07/07/2011   Procedure: LEFT HEART CATHETERIZATION WITH CORONARY ANGIOGRAM;  Surgeon: Josue Hector, MD;  Location: [facility] C[facility] LAB;  Service: Cardiovascular;  Laterality: N/A;  . LUMBAR LAMINECTOMY/DECOMPRESSION MICRODISCECTOMY Left 08/19/2015   Procedure: Left Lumbar four-five Microdiskectomy;  Surgeon: Eustace Moore, MD;  Location: [facility] NEURO ORS;  Service: Neurosurgery;  Laterality: Left;  left  . MASS EXCISION  07/07/2011   Procedure: EXCISION MASS;  Surgeon: Tharon Aquas Adelene Idler, MD;  Location: [place];  Service: Open Heart Surgery;  Laterality: N/A;  Excision of mediastinal mass  . MYRINGOTOMY WITH  TUBE PLACEMENT Right 10/24/2012   Procedure: MYRINGOTOMY WITH TUBE PLACEMENT;  Surgeon: Melissa Montane, MD;  Location: [place];  Service: ENT;  Laterality: Right;  . NASAL ENDOSCOPY WITH EPISTAXIS CONTROL N/A 10/24/2012   Procedure: NASAL ENDOSCOPY WITH EPISTAXIS CONTROL;  Surgeon: Melissa Montane, MD;  Location: [place];  Service: ENT;  Laterality: N/A;  nasal endoscopy without epistaxis control... only need 30 minutes   . RIGHT HEART CATHETERIZATION  07/07/2011   Procedure: RIGHT HEART CATH;  Surgeon: Josue Hector, MD;  Location: [facility] C[facility] LAB;  Service: Cardiovascular;;  . TONSILLECTOMY    . TOTAL KNEE ARTHROPLASTY      a.  Left ~ 2007, Right 10/2010  . VSD REPAIR  07/07/2011   Procedure: VENTRICULAR SEPTAL DEFECT (VSD) REPAIR;  Surgeon: Len Childs, MD;  Location: [place];  Service: Open Heart Surgery;  Laterality: N/A;  with bovine pericardium 4cm x 4cm    OB History    No data available       Home Medications    Prior to Admission medications   Medication Sig Start Date End Date Taking? Authorizing Provider  aspirin EC 81 MG tablet Take 81 mg by mouth daily.   Yes [provider]  carvedilol (COREG) 6.25 MG tablet Take 1 tablet (6.25 mg total) by mouth 2 (two) times daily with a meal. 09/22/11 04/07/17 Yes Rogelia Mire, NP  cholecalciferol (VITAMIN D) 1000 units tablet Take 1,000 Units by mouth daily.   Yes [provider]  furosemide (LASIX) 20 MG tablet Take 20 mg by mouth daily.  09/18/16  Yes [provider]  Ibuprofen-Diphenhydramine HCl (ADVIL PM) 200-25 MG CAPS Take 1 tablet by mouth at bedtime. sleep   Yes [provider]  levothyroxine (SYNTHROID, LEVOTHROID) 50 MCG tablet Take 25 mcg by mouth daily before breakfast.    Yes [provider]  linagliptin (TRADJENTA) 5 MG TABS tablet Take 2.5 mg by mouth daily.    Yes [provider]  losartan (COZAAR) 100 MG tablet Take 100 mg by mouth daily. 10/26/14  Yes [provider]  Menthol-Methyl Salicylate (MUSCLE RUB) 10-15 % CREA Apply 1 application topically at bedtime.   Yes [provider]  metFORMIN (GLUCOPHAGE) 500 MG tablet Take 500 mg by mouth 2 (two) times daily with a meal.  02/07/14  Yes [provider]  naproxen sodium (ALEVE) 220 MG tablet Take 220 mg by mouth 2 (two) times daily as needed. Pain   Yes [provider]  omeprazole (PRILOSEC) 20 MG capsule Take 20 mg by mouth daily.  10/19/16  Yes [provider]  oxybutynin (DITROPAN) 5 MG tablet Take 5 mg by mouth daily.  06/30/16  Yes [provider]  simvastatin (ZOCOR) 40 MG  tablet Take 40 mg by mouth every evening.   Yes [provider]  HYDROcodone-acetaminophen (NORCO/VICODIN) 5-325 MG tablet Take 1-2 tablets by mouth every 6 hours as needed for pain. 04/07/17   Issac Moure, Elmyra Ricks, PA-C  sulfamethoxazole-trimethoprim (BACTRIM DS) 800-160 MG tablet Take 1 tablet by mouth 2 (two) times daily. 04/07/17 04/17/17  Nyair Depaulo, Elmyra Ricks, PA-C    Family History Family History  Problem Relation Age of Onset  . Heart disease Mother     Social History Social History  Substance Use Topics  . Smoking status: Never Smoker  . Smokeless tobacco: Never Used  . Alcohol use No     Allergies   Amoxapine and related and Clozapine   Review of Systems Review  of Systems  A complete review of systems was obtained and all systems are negative except as noted in the HPI and PMH.   Physical Exam Updated Vital Signs BP 135/72 (BP Location: Left Arm)   Pulse 75   Temp 98.5 F (36.9 C) (Oral)   Resp 18   Ht 5\' 1"  (1.549 m)   Wt 71.2 kg (157 lb)   SpO2 97%   BMI 29.66 kg/m   Physical Exam  Constitutional: She is oriented to person, place, and time. She appears well-developed and well-nourished. No distress.  HENT:  Head: Normocephalic and atraumatic.  Mouth/Throat: Oropharynx is clear and moist.  Eyes: Pupils are equal, round, and reactive to light. Conjunctivae and EOM are normal.  Neck: Normal range of motion.  No midline C-spine  tenderness to palpation or step-offs appreciated. Patient has full range of motion without pain.  Grip strength, biceps, triceps 5/5 bilaterally;  can differentiate between pinprick and light touch bilaterally.   Cardiovascular: Normal rate, regular rhythm and intact distal pulses.   Pulmonary/Chest: Effort normal and breath sounds normal.  Abdominal: Soft. There is no tenderness.  Musculoskeletal: She exhibits deformity.  Hyperextension deformity of the right thumb.  She has a minute puncture wound with bleeding controlled on  the volar aspect of the right first MCP.  Distally neurovascularly intact, no focal bony tenderness along the wrist or snuffbox.  Neurological: She is alert and oriented to person, place, and time.  Skin: She is not diaphoretic.  Psychiatric: She has a normal mood and affect.  Nursing note and vitals reviewed.    ED Treatments / Results  Labs (all labs ordered are listed, but only abnormal results are displayed) Labs Reviewed  CBC WITH DIFFERENTIAL/PLATELET - Abnormal; Notable for the following:       Result Value   WBC 13.7 (*)    Neutro Abs 10.9 (*)    Lymphs Abs 0.6 (*)    Eosinophils Absolute 1.3 (*)    All other components within normal limits  BASIC METABOLIC PANEL - Abnormal; Notable for the following:    CO2 21 (*)    Glucose, Bld 149 (*)    BUN 39 (*)    Creatinine, Ser 1.45 (*)    GFR calc non Af Amer 31 (*)    GFR calc Af Amer 36 (*)    All other components within normal limits    EKG  EKG Interpretation  Date/Time:  Saturday April 07 2017 20:44:12 EDT Ventricular Rate:  83 PR Interval:    QRS Duration: 79 QT Interval:  380 QTC Calculation: 447 R Axis:   54 Text Interpretation:  Sinus rhythm Prolonged PR interval Probable left atrial enlargement Probable anterior infarct, old Abnrm T, consider ischemia, anterolateral lds Minimal ST elevation, inferior leads ST elevation similar to Sept 2018 Confirmed by Sherwood Gambler [phone]) on 04/07/2017 8:58:32 PM       Radiology Dg Wrist Complete Right  Result Date: 04/07/2017 CLINICAL DATA:  83 year old female status post reduction of right thumb dislocation. EXAM: RIGHT THUMB 2+V; RIGHT WRIST - COMPLETE 3+ VIEW COMPARISON:  Earlier radiograph dated 04/07/2017 FINDINGS: There has been interval reduction of the previously seen dislocated first MCP joint, now appears in anatomic alignment. Slight irregularity of the base of the proximal phalanx of the thumb seen on the oblique view of the wrist. Underlying fracture is  not excluded. No other fracture identified. No dislocation. There is severe arthritic changes of the first interphalangeal joint. There is also osteoarthritic  changes of the base of the thumb. The soft tissues appear unremarkable. IMPRESSION: 1. Successful interval reduction of the previously seen dislocated first MCP joint. 2. Slight irregularity of the base of the proximal phalanx of the thumb. Underlying fracture is not excluded. No other acute fracture identified. 3. Osteopenia and arthritic changes of the base of the thumb and first interphalangeal joint. Electronically Signed   By: Anner Crete M.D.   On: 04/07/2017 22:13   Dg Finger Thumb Right  Result Date: 04/07/2017 CLINICAL DATA:  83 year old female status post reduction of right thumb dislocation. EXAM: RIGHT THUMB 2+V; RIGHT WRIST - COMPLETE 3+ VIEW COMPARISON:  Earlier radiograph dated 04/07/2017 FINDINGS: There has been interval reduction of the previously seen dislocated first MCP joint, now appears in anatomic alignment. Slight irregularity of the base of the proximal phalanx of the thumb seen on the oblique view of the wrist. Underlying fracture is not excluded. No other fracture identified. No dislocation. There is severe arthritic changes of the first interphalangeal joint. There is also osteoarthritic changes of the base of the thumb. The soft tissues appear unremarkable. IMPRESSION: 1. Successful interval reduction of the previously seen dislocated first MCP joint. 2. Slight irregularity of the base of the proximal phalanx of the thumb. Underlying fracture is not excluded. No other acute fracture identified. 3. Osteopenia and arthritic changes of the base of the thumb and first interphalangeal joint. Electronically Signed   By: Anner Crete M.D.   On: 04/07/2017 22:13   Dg Finger Thumb Right  Result Date: 04/07/2017 CLINICAL DATA:  58-year-old female with fall and deformity of the right thumb. EXAM: RIGHT THUMB 2+V COMPARISON:   None. FINDINGS: There is dislocation of the first metacarpophalangeal joint. A small bone fragment adjacent to the base of the proximal phalanx of the thumb represents a small fracture. The soft tissues are grossly unremarkable. IMPRESSION: Dislocation of the MCP joint of the thumb with a small fracture of the base of the proximal phalanx of the thumb. Electronically Signed   By: Anner Crete M.D.   On: 04/07/2017 21:02    Procedures Reduction of dislocation Date/Time: 04/07/2017 10:22 PM Performed by: Monico Blitz Authorized by: Monico Blitz  Consent: Verbal consent obtained. Risks and benefits: risks, benefits and alternatives were discussed Consent given by: patient Patient identity confirmed: verbally with patient Time out: Immediately prior to procedure a "time out" was called to verify the correct patient, procedure, equipment, support staff and site/side marked as required. Preparation: Patient was prepped and draped in the usual sterile fashion. Local anesthesia used: no  Anesthesia: Local anesthesia used: no  Sedation: Patient sedated: no Patient tolerance: Patient tolerated the procedure well with no immediate complications    (including critical care time)  Medications Ordered in ED Medications  morphine 4 MG/ML injection 4 mg (4 mg Intravenous Given 04/07/17 2047)  ondansetron (ZOFRAN) injection 4 mg (4 mg Intravenous Given 04/07/17 2047)  Tdap (BOOSTRIX) injection 0.5 mL (0.5 mLs Intramuscular Given 04/07/17 2052)  ceFAZolin (ANCEF) IVPB 2g/100 mL premix (0 g Intravenous Stopped 04/07/17 2235)     Initial Impression / Assessment and Plan / ED Course  I have reviewed the triage vital signs and the nursing notes.  Pertinent labs & imaging results that were available during my care of the patient were reviewed by me and considered in my medical decision making (see chart for details).     Vitals:   04/07/17 1938 04/07/17 2038 04/07/17 2335  BP:  (!)  134/55  135/72  Pulse:  84 75  Resp:  20 18  Temp:  98.5 F (36.9 C)   TempSrc:  Oral   SpO2:  98% 97%  Weight: 71.2 kg (157 lb)    Height: 5\' 1"  (1.549 m)      Medications  morphine 4 MG/ML injection 4 mg (4 mg Intravenous Given 04/07/17 2047)  ondansetron (ZOFRAN) injection 4 mg (4 mg Intravenous Given 04/07/17 2047)  Tdap (BOOSTRIX) injection 0.5 mL (0.5 mLs Intramuscular Given 04/07/17 2052)  ceFAZolin (ANCEF) IVPB 2g/100 mL premix (0 g Intravenous Stopped 04/07/17 2235)    [name] is 83 y.o. female presenting with right hand pain status post fall, dislocation of the thumb at the MCP.  She has a small puncture wound on the volar aspect of that joint.  X-ray with fracture.  Dislocation reduced and she feels much better, remains neurovascular intact, patient's wounds are cleaned and dressed and she has splinted.  Tetanus is updated, 2 g of Ancef given.  Discussed with hand surgery Dr. Amedeo Plenty, recommends Bactrim twice daily 10 days will follow with her in the office in 7 days.  EKG unchanged from prior, blood work reassuring, patient with no urinary complaints.  His ambulated in the ED without complication.  This is a shared visit with the attending physician who personally evaluated the patient and agrees with the care plan.   Evaluation does not show pathology that would require ongoing emergent intervention or inpatient treatment. Pt is hemodynamically stable and mentating appropriately. Discussed findings and plan with patient/guardian, who agrees with care plan. All questions answered. Return precautions discussed and outpatient follow up given.    Final Clinical Impressions(s) / ED Diagnoses   Final diagnoses:  Finger dislocation, initial encounter  Puncture wound    New Prescriptions Discharge Medication List as of 04/07/2017 11:27 PM       Matthias Bogus, Elmyra Ricks, PA-C 04/08/17 0076    Sherwood Gambler, MD 04/08/17 [phone]

## 2017-04-11 DIAGNOSIS — M79644 Pain in right finger(s): Secondary | ICD-10-CM | POA: Diagnosis not present

## 2017-04-11 DIAGNOSIS — S63104A Unspecified dislocation of right thumb, initial encounter: Secondary | ICD-10-CM | POA: Diagnosis not present

## 2017-05-03 DIAGNOSIS — S63104A Unspecified dislocation of right thumb, initial encounter: Secondary | ICD-10-CM | POA: Diagnosis not present

## 2017-05-17 DIAGNOSIS — M25562 Pain in left knee: Secondary | ICD-10-CM | POA: Diagnosis not present

## 2017-05-17 DIAGNOSIS — Z471 Aftercare following joint replacement surgery: Secondary | ICD-10-CM | POA: Diagnosis not present

## 2017-05-17 DIAGNOSIS — Z96653 Presence of artificial knee joint, bilateral: Secondary | ICD-10-CM | POA: Diagnosis not present

## 2017-05-17 DIAGNOSIS — M25561 Pain in right knee: Secondary | ICD-10-CM | POA: Diagnosis not present

## 2017-05-22 DIAGNOSIS — I10 Essential (primary) hypertension: Secondary | ICD-10-CM | POA: Diagnosis not present

## 2017-05-22 DIAGNOSIS — E119 Type 2 diabetes mellitus without complications: Secondary | ICD-10-CM | POA: Diagnosis not present

## 2017-05-24 DIAGNOSIS — H6123 Impacted cerumen, bilateral: Secondary | ICD-10-CM | POA: Diagnosis not present

## 2017-05-31 DIAGNOSIS — M25562 Pain in left knee: Secondary | ICD-10-CM | POA: Diagnosis not present

## 2017-05-31 DIAGNOSIS — M25561 Pain in right knee: Secondary | ICD-10-CM | POA: Diagnosis not present

## 2017-06-06 DIAGNOSIS — I2510 Atherosclerotic heart disease of native coronary artery without angina pectoris: Secondary | ICD-10-CM | POA: Diagnosis not present

## 2017-06-06 DIAGNOSIS — E039 Hypothyroidism, unspecified: Secondary | ICD-10-CM | POA: Diagnosis not present

## 2017-06-06 DIAGNOSIS — R739 Hyperglycemia, unspecified: Secondary | ICD-10-CM | POA: Diagnosis not present

## 2017-06-06 DIAGNOSIS — R2681 Unsteadiness on feet: Secondary | ICD-10-CM | POA: Diagnosis not present

## 2017-06-06 DIAGNOSIS — N183 Chronic kidney disease, stage 3 (moderate): Secondary | ICD-10-CM | POA: Diagnosis not present

## 2017-06-06 DIAGNOSIS — I10 Essential (primary) hypertension: Secondary | ICD-10-CM | POA: Diagnosis not present

## 2017-06-07 DIAGNOSIS — M25562 Pain in left knee: Secondary | ICD-10-CM | POA: Insufficient documentation

## 2017-06-07 DIAGNOSIS — M25561 Pain in right knee: Secondary | ICD-10-CM | POA: Diagnosis not present

## 2017-06-12 DIAGNOSIS — M25561 Pain in right knee: Secondary | ICD-10-CM | POA: Diagnosis not present

## 2017-06-12 DIAGNOSIS — M25562 Pain in left knee: Secondary | ICD-10-CM | POA: Diagnosis not present

## 2017-06-14 DIAGNOSIS — M1991 Primary osteoarthritis, unspecified site: Secondary | ICD-10-CM | POA: Diagnosis not present

## 2017-06-14 DIAGNOSIS — I2510 Atherosclerotic heart disease of native coronary artery without angina pectoris: Secondary | ICD-10-CM | POA: Diagnosis not present

## 2017-06-14 DIAGNOSIS — I129 Hypertensive chronic kidney disease with stage 1 through stage 4 chronic kidney disease, or unspecified chronic kidney disease: Secondary | ICD-10-CM | POA: Diagnosis not present

## 2017-06-14 DIAGNOSIS — E1165 Type 2 diabetes mellitus with hyperglycemia: Secondary | ICD-10-CM | POA: Diagnosis not present

## 2017-06-14 DIAGNOSIS — R2681 Unsteadiness on feet: Secondary | ICD-10-CM | POA: Diagnosis not present

## 2017-06-14 DIAGNOSIS — Z7984 Long term (current) use of oral hypoglycemic drugs: Secondary | ICD-10-CM | POA: Diagnosis not present

## 2017-06-14 DIAGNOSIS — E1122 Type 2 diabetes mellitus with diabetic chronic kidney disease: Secondary | ICD-10-CM | POA: Diagnosis not present

## 2017-06-14 DIAGNOSIS — N183 Chronic kidney disease, stage 3 (moderate): Secondary | ICD-10-CM | POA: Diagnosis not present

## 2017-06-19 DIAGNOSIS — E1122 Type 2 diabetes mellitus with diabetic chronic kidney disease: Secondary | ICD-10-CM | POA: Diagnosis not present

## 2017-06-19 DIAGNOSIS — Z7984 Long term (current) use of oral hypoglycemic drugs: Secondary | ICD-10-CM | POA: Diagnosis not present

## 2017-06-19 DIAGNOSIS — I129 Hypertensive chronic kidney disease with stage 1 through stage 4 chronic kidney disease, or unspecified chronic kidney disease: Secondary | ICD-10-CM | POA: Diagnosis not present

## 2017-06-19 DIAGNOSIS — I2510 Atherosclerotic heart disease of native coronary artery without angina pectoris: Secondary | ICD-10-CM | POA: Diagnosis not present

## 2017-06-19 DIAGNOSIS — M1991 Primary osteoarthritis, unspecified site: Secondary | ICD-10-CM | POA: Diagnosis not present

## 2017-06-19 DIAGNOSIS — R2681 Unsteadiness on feet: Secondary | ICD-10-CM | POA: Diagnosis not present

## 2017-06-19 DIAGNOSIS — E1165 Type 2 diabetes mellitus with hyperglycemia: Secondary | ICD-10-CM | POA: Diagnosis not present

## 2017-06-19 DIAGNOSIS — N183 Chronic kidney disease, stage 3 (moderate): Secondary | ICD-10-CM | POA: Diagnosis not present

## 2017-06-21 DIAGNOSIS — I129 Hypertensive chronic kidney disease with stage 1 through stage 4 chronic kidney disease, or unspecified chronic kidney disease: Secondary | ICD-10-CM | POA: Diagnosis not present

## 2017-06-21 DIAGNOSIS — R2681 Unsteadiness on feet: Secondary | ICD-10-CM | POA: Diagnosis not present

## 2017-06-21 DIAGNOSIS — I2510 Atherosclerotic heart disease of native coronary artery without angina pectoris: Secondary | ICD-10-CM | POA: Diagnosis not present

## 2017-06-21 DIAGNOSIS — E1122 Type 2 diabetes mellitus with diabetic chronic kidney disease: Secondary | ICD-10-CM | POA: Diagnosis not present

## 2017-06-21 DIAGNOSIS — N183 Chronic kidney disease, stage 3 (moderate): Secondary | ICD-10-CM | POA: Diagnosis not present

## 2017-06-21 DIAGNOSIS — E1165 Type 2 diabetes mellitus with hyperglycemia: Secondary | ICD-10-CM | POA: Diagnosis not present

## 2017-06-21 DIAGNOSIS — Z7984 Long term (current) use of oral hypoglycemic drugs: Secondary | ICD-10-CM | POA: Diagnosis not present

## 2017-06-21 DIAGNOSIS — M1991 Primary osteoarthritis, unspecified site: Secondary | ICD-10-CM | POA: Diagnosis not present

## 2017-06-25 DIAGNOSIS — E1122 Type 2 diabetes mellitus with diabetic chronic kidney disease: Secondary | ICD-10-CM | POA: Diagnosis not present

## 2017-06-25 DIAGNOSIS — R2681 Unsteadiness on feet: Secondary | ICD-10-CM | POA: Diagnosis not present

## 2017-06-25 DIAGNOSIS — I129 Hypertensive chronic kidney disease with stage 1 through stage 4 chronic kidney disease, or unspecified chronic kidney disease: Secondary | ICD-10-CM | POA: Diagnosis not present

## 2017-06-25 DIAGNOSIS — N183 Chronic kidney disease, stage 3 (moderate): Secondary | ICD-10-CM | POA: Diagnosis not present

## 2017-06-25 DIAGNOSIS — M1991 Primary osteoarthritis, unspecified site: Secondary | ICD-10-CM | POA: Diagnosis not present

## 2017-06-25 DIAGNOSIS — I2510 Atherosclerotic heart disease of native coronary artery without angina pectoris: Secondary | ICD-10-CM | POA: Diagnosis not present

## 2017-06-25 DIAGNOSIS — E1165 Type 2 diabetes mellitus with hyperglycemia: Secondary | ICD-10-CM | POA: Diagnosis not present

## 2017-06-25 DIAGNOSIS — Z7984 Long term (current) use of oral hypoglycemic drugs: Secondary | ICD-10-CM | POA: Diagnosis not present

## 2017-06-27 DIAGNOSIS — R69 Illness, unspecified: Secondary | ICD-10-CM | POA: Diagnosis not present

## 2017-06-28 DIAGNOSIS — I129 Hypertensive chronic kidney disease with stage 1 through stage 4 chronic kidney disease, or unspecified chronic kidney disease: Secondary | ICD-10-CM | POA: Diagnosis not present

## 2017-06-28 DIAGNOSIS — R2681 Unsteadiness on feet: Secondary | ICD-10-CM | POA: Diagnosis not present

## 2017-06-28 DIAGNOSIS — Z7984 Long term (current) use of oral hypoglycemic drugs: Secondary | ICD-10-CM | POA: Diagnosis not present

## 2017-06-28 DIAGNOSIS — M1991 Primary osteoarthritis, unspecified site: Secondary | ICD-10-CM | POA: Diagnosis not present

## 2017-06-28 DIAGNOSIS — I2510 Atherosclerotic heart disease of native coronary artery without angina pectoris: Secondary | ICD-10-CM | POA: Diagnosis not present

## 2017-06-28 DIAGNOSIS — E1122 Type 2 diabetes mellitus with diabetic chronic kidney disease: Secondary | ICD-10-CM | POA: Diagnosis not present

## 2017-06-28 DIAGNOSIS — E1165 Type 2 diabetes mellitus with hyperglycemia: Secondary | ICD-10-CM | POA: Diagnosis not present

## 2017-06-28 DIAGNOSIS — N183 Chronic kidney disease, stage 3 (moderate): Secondary | ICD-10-CM | POA: Diagnosis not present

## 2017-07-02 DIAGNOSIS — I129 Hypertensive chronic kidney disease with stage 1 through stage 4 chronic kidney disease, or unspecified chronic kidney disease: Secondary | ICD-10-CM | POA: Diagnosis not present

## 2017-07-02 DIAGNOSIS — R2681 Unsteadiness on feet: Secondary | ICD-10-CM | POA: Diagnosis not present

## 2017-07-02 DIAGNOSIS — Z7984 Long term (current) use of oral hypoglycemic drugs: Secondary | ICD-10-CM | POA: Diagnosis not present

## 2017-07-02 DIAGNOSIS — N183 Chronic kidney disease, stage 3 (moderate): Secondary | ICD-10-CM | POA: Diagnosis not present

## 2017-07-02 DIAGNOSIS — M1991 Primary osteoarthritis, unspecified site: Secondary | ICD-10-CM | POA: Diagnosis not present

## 2017-07-02 DIAGNOSIS — I2510 Atherosclerotic heart disease of native coronary artery without angina pectoris: Secondary | ICD-10-CM | POA: Diagnosis not present

## 2017-07-02 DIAGNOSIS — E1122 Type 2 diabetes mellitus with diabetic chronic kidney disease: Secondary | ICD-10-CM | POA: Diagnosis not present

## 2017-07-02 DIAGNOSIS — E1165 Type 2 diabetes mellitus with hyperglycemia: Secondary | ICD-10-CM | POA: Diagnosis not present

## 2017-07-05 DIAGNOSIS — I2510 Atherosclerotic heart disease of native coronary artery without angina pectoris: Secondary | ICD-10-CM | POA: Diagnosis not present

## 2017-07-05 DIAGNOSIS — I129 Hypertensive chronic kidney disease with stage 1 through stage 4 chronic kidney disease, or unspecified chronic kidney disease: Secondary | ICD-10-CM | POA: Diagnosis not present

## 2017-07-05 DIAGNOSIS — E1122 Type 2 diabetes mellitus with diabetic chronic kidney disease: Secondary | ICD-10-CM | POA: Diagnosis not present

## 2017-07-05 DIAGNOSIS — M1991 Primary osteoarthritis, unspecified site: Secondary | ICD-10-CM | POA: Diagnosis not present

## 2017-07-05 DIAGNOSIS — N183 Chronic kidney disease, stage 3 (moderate): Secondary | ICD-10-CM | POA: Diagnosis not present

## 2017-07-05 DIAGNOSIS — R2681 Unsteadiness on feet: Secondary | ICD-10-CM | POA: Diagnosis not present

## 2017-07-05 DIAGNOSIS — Z7984 Long term (current) use of oral hypoglycemic drugs: Secondary | ICD-10-CM | POA: Diagnosis not present

## 2017-07-05 DIAGNOSIS — E1165 Type 2 diabetes mellitus with hyperglycemia: Secondary | ICD-10-CM | POA: Diagnosis not present

## 2017-07-10 ENCOUNTER — Ambulatory Visit: Payer: Medicare HMO | Admitting: Podiatry

## 2017-07-10 DIAGNOSIS — E1122 Type 2 diabetes mellitus with diabetic chronic kidney disease: Secondary | ICD-10-CM | POA: Diagnosis not present

## 2017-07-10 DIAGNOSIS — N183 Chronic kidney disease, stage 3 (moderate): Secondary | ICD-10-CM | POA: Diagnosis not present

## 2017-07-10 DIAGNOSIS — R2681 Unsteadiness on feet: Secondary | ICD-10-CM | POA: Diagnosis not present

## 2017-07-10 DIAGNOSIS — M1991 Primary osteoarthritis, unspecified site: Secondary | ICD-10-CM | POA: Diagnosis not present

## 2017-07-10 DIAGNOSIS — I129 Hypertensive chronic kidney disease with stage 1 through stage 4 chronic kidney disease, or unspecified chronic kidney disease: Secondary | ICD-10-CM | POA: Diagnosis not present

## 2017-07-10 DIAGNOSIS — Z7984 Long term (current) use of oral hypoglycemic drugs: Secondary | ICD-10-CM | POA: Diagnosis not present

## 2017-07-10 DIAGNOSIS — E1165 Type 2 diabetes mellitus with hyperglycemia: Secondary | ICD-10-CM | POA: Diagnosis not present

## 2017-07-10 DIAGNOSIS — I2510 Atherosclerotic heart disease of native coronary artery without angina pectoris: Secondary | ICD-10-CM | POA: Diagnosis not present

## 2017-07-12 ENCOUNTER — Ambulatory Visit (INDEPENDENT_AMBULATORY_CARE_PROVIDER_SITE_OTHER): Payer: Medicare HMO | Admitting: Podiatry

## 2017-07-12 ENCOUNTER — Encounter: Payer: Self-pay | Admitting: Podiatry

## 2017-07-12 DIAGNOSIS — B351 Tinea unguium: Secondary | ICD-10-CM

## 2017-07-12 DIAGNOSIS — M79674 Pain in right toe(s): Secondary | ICD-10-CM

## 2017-07-12 DIAGNOSIS — E1165 Type 2 diabetes mellitus with hyperglycemia: Secondary | ICD-10-CM | POA: Diagnosis not present

## 2017-07-12 DIAGNOSIS — I2510 Atherosclerotic heart disease of native coronary artery without angina pectoris: Secondary | ICD-10-CM | POA: Diagnosis not present

## 2017-07-12 DIAGNOSIS — Z7984 Long term (current) use of oral hypoglycemic drugs: Secondary | ICD-10-CM | POA: Diagnosis not present

## 2017-07-12 DIAGNOSIS — N183 Chronic kidney disease, stage 3 (moderate): Secondary | ICD-10-CM | POA: Diagnosis not present

## 2017-07-12 DIAGNOSIS — M79675 Pain in left toe(s): Secondary | ICD-10-CM | POA: Diagnosis not present

## 2017-07-12 DIAGNOSIS — E1122 Type 2 diabetes mellitus with diabetic chronic kidney disease: Secondary | ICD-10-CM | POA: Diagnosis not present

## 2017-07-12 DIAGNOSIS — I129 Hypertensive chronic kidney disease with stage 1 through stage 4 chronic kidney disease, or unspecified chronic kidney disease: Secondary | ICD-10-CM | POA: Diagnosis not present

## 2017-07-12 DIAGNOSIS — R2681 Unsteadiness on feet: Secondary | ICD-10-CM | POA: Diagnosis not present

## 2017-07-12 DIAGNOSIS — M1991 Primary osteoarthritis, unspecified site: Secondary | ICD-10-CM | POA: Diagnosis not present

## 2017-07-13 NOTE — Progress Notes (Signed)
Subjective:   Patient ID: [name], female   DOB: 84 y.o.   MRN: [id number]   HPI Patient presents with nail disease today 1-5 both feet that are elongated thick and hard for her to cut   ROS      Objective:  Physical Exam  Mycotic nail infection with pain 1-5 both feet     Assessment:  Debride painful nailbeds 1-5 both feet     Plan:  I did note the nails to be thickened yellow brittle and they were painful when pressed and no iatrogenic bleeding was noted upon debridement

## 2017-07-17 DIAGNOSIS — N183 Chronic kidney disease, stage 3 (moderate): Secondary | ICD-10-CM | POA: Diagnosis not present

## 2017-07-17 DIAGNOSIS — I129 Hypertensive chronic kidney disease with stage 1 through stage 4 chronic kidney disease, or unspecified chronic kidney disease: Secondary | ICD-10-CM | POA: Diagnosis not present

## 2017-07-17 DIAGNOSIS — M1991 Primary osteoarthritis, unspecified site: Secondary | ICD-10-CM | POA: Diagnosis not present

## 2017-07-17 DIAGNOSIS — R2681 Unsteadiness on feet: Secondary | ICD-10-CM | POA: Diagnosis not present

## 2017-07-17 DIAGNOSIS — I2510 Atherosclerotic heart disease of native coronary artery without angina pectoris: Secondary | ICD-10-CM | POA: Diagnosis not present

## 2017-07-17 DIAGNOSIS — Z7984 Long term (current) use of oral hypoglycemic drugs: Secondary | ICD-10-CM | POA: Diagnosis not present

## 2017-07-17 DIAGNOSIS — E1122 Type 2 diabetes mellitus with diabetic chronic kidney disease: Secondary | ICD-10-CM | POA: Diagnosis not present

## 2017-07-17 DIAGNOSIS — E1165 Type 2 diabetes mellitus with hyperglycemia: Secondary | ICD-10-CM | POA: Diagnosis not present

## 2017-07-19 DIAGNOSIS — N183 Chronic kidney disease, stage 3 (moderate): Secondary | ICD-10-CM | POA: Diagnosis not present

## 2017-07-19 DIAGNOSIS — I2510 Atherosclerotic heart disease of native coronary artery without angina pectoris: Secondary | ICD-10-CM | POA: Diagnosis not present

## 2017-07-19 DIAGNOSIS — N3941 Urge incontinence: Secondary | ICD-10-CM | POA: Diagnosis not present

## 2017-07-19 DIAGNOSIS — Z7984 Long term (current) use of oral hypoglycemic drugs: Secondary | ICD-10-CM | POA: Diagnosis not present

## 2017-07-19 DIAGNOSIS — E1165 Type 2 diabetes mellitus with hyperglycemia: Secondary | ICD-10-CM | POA: Diagnosis not present

## 2017-07-19 DIAGNOSIS — R2681 Unsteadiness on feet: Secondary | ICD-10-CM | POA: Diagnosis not present

## 2017-07-19 DIAGNOSIS — I129 Hypertensive chronic kidney disease with stage 1 through stage 4 chronic kidney disease, or unspecified chronic kidney disease: Secondary | ICD-10-CM | POA: Diagnosis not present

## 2017-07-19 DIAGNOSIS — E1122 Type 2 diabetes mellitus with diabetic chronic kidney disease: Secondary | ICD-10-CM | POA: Diagnosis not present

## 2017-07-19 DIAGNOSIS — M1991 Primary osteoarthritis, unspecified site: Secondary | ICD-10-CM | POA: Diagnosis not present

## 2017-07-19 DIAGNOSIS — R350 Frequency of micturition: Secondary | ICD-10-CM | POA: Diagnosis not present

## 2017-07-24 DIAGNOSIS — N183 Chronic kidney disease, stage 3 (moderate): Secondary | ICD-10-CM | POA: Diagnosis not present

## 2017-07-24 DIAGNOSIS — I2510 Atherosclerotic heart disease of native coronary artery without angina pectoris: Secondary | ICD-10-CM | POA: Diagnosis not present

## 2017-07-24 DIAGNOSIS — Z7984 Long term (current) use of oral hypoglycemic drugs: Secondary | ICD-10-CM | POA: Diagnosis not present

## 2017-07-24 DIAGNOSIS — R2681 Unsteadiness on feet: Secondary | ICD-10-CM | POA: Diagnosis not present

## 2017-07-24 DIAGNOSIS — I129 Hypertensive chronic kidney disease with stage 1 through stage 4 chronic kidney disease, or unspecified chronic kidney disease: Secondary | ICD-10-CM | POA: Diagnosis not present

## 2017-07-24 DIAGNOSIS — E1165 Type 2 diabetes mellitus with hyperglycemia: Secondary | ICD-10-CM | POA: Diagnosis not present

## 2017-07-24 DIAGNOSIS — E1122 Type 2 diabetes mellitus with diabetic chronic kidney disease: Secondary | ICD-10-CM | POA: Diagnosis not present

## 2017-07-24 DIAGNOSIS — M1991 Primary osteoarthritis, unspecified site: Secondary | ICD-10-CM | POA: Diagnosis not present

## 2017-07-26 DIAGNOSIS — E1122 Type 2 diabetes mellitus with diabetic chronic kidney disease: Secondary | ICD-10-CM | POA: Diagnosis not present

## 2017-07-26 DIAGNOSIS — I129 Hypertensive chronic kidney disease with stage 1 through stage 4 chronic kidney disease, or unspecified chronic kidney disease: Secondary | ICD-10-CM | POA: Diagnosis not present

## 2017-07-26 DIAGNOSIS — I2510 Atherosclerotic heart disease of native coronary artery without angina pectoris: Secondary | ICD-10-CM | POA: Diagnosis not present

## 2017-07-26 DIAGNOSIS — E1165 Type 2 diabetes mellitus with hyperglycemia: Secondary | ICD-10-CM | POA: Diagnosis not present

## 2017-07-26 DIAGNOSIS — M1991 Primary osteoarthritis, unspecified site: Secondary | ICD-10-CM | POA: Diagnosis not present

## 2017-07-26 DIAGNOSIS — Z7984 Long term (current) use of oral hypoglycemic drugs: Secondary | ICD-10-CM | POA: Diagnosis not present

## 2017-07-26 DIAGNOSIS — R2681 Unsteadiness on feet: Secondary | ICD-10-CM | POA: Diagnosis not present

## 2017-07-26 DIAGNOSIS — N183 Chronic kidney disease, stage 3 (moderate): Secondary | ICD-10-CM | POA: Diagnosis not present

## 2017-07-31 DIAGNOSIS — R2681 Unsteadiness on feet: Secondary | ICD-10-CM | POA: Diagnosis not present

## 2017-07-31 DIAGNOSIS — M1991 Primary osteoarthritis, unspecified site: Secondary | ICD-10-CM | POA: Diagnosis not present

## 2017-07-31 DIAGNOSIS — E1122 Type 2 diabetes mellitus with diabetic chronic kidney disease: Secondary | ICD-10-CM | POA: Diagnosis not present

## 2017-07-31 DIAGNOSIS — N183 Chronic kidney disease, stage 3 (moderate): Secondary | ICD-10-CM | POA: Diagnosis not present

## 2017-07-31 DIAGNOSIS — I2510 Atherosclerotic heart disease of native coronary artery without angina pectoris: Secondary | ICD-10-CM | POA: Diagnosis not present

## 2017-07-31 DIAGNOSIS — Z7984 Long term (current) use of oral hypoglycemic drugs: Secondary | ICD-10-CM | POA: Diagnosis not present

## 2017-07-31 DIAGNOSIS — I129 Hypertensive chronic kidney disease with stage 1 through stage 4 chronic kidney disease, or unspecified chronic kidney disease: Secondary | ICD-10-CM | POA: Diagnosis not present

## 2017-07-31 DIAGNOSIS — E1165 Type 2 diabetes mellitus with hyperglycemia: Secondary | ICD-10-CM | POA: Diagnosis not present

## 2017-08-02 DIAGNOSIS — N183 Chronic kidney disease, stage 3 (moderate): Secondary | ICD-10-CM | POA: Diagnosis not present

## 2017-08-02 DIAGNOSIS — R2681 Unsteadiness on feet: Secondary | ICD-10-CM | POA: Diagnosis not present

## 2017-08-02 DIAGNOSIS — I129 Hypertensive chronic kidney disease with stage 1 through stage 4 chronic kidney disease, or unspecified chronic kidney disease: Secondary | ICD-10-CM | POA: Diagnosis not present

## 2017-08-02 DIAGNOSIS — I2510 Atherosclerotic heart disease of native coronary artery without angina pectoris: Secondary | ICD-10-CM | POA: Diagnosis not present

## 2017-08-02 DIAGNOSIS — E1165 Type 2 diabetes mellitus with hyperglycemia: Secondary | ICD-10-CM | POA: Diagnosis not present

## 2017-08-02 DIAGNOSIS — M1991 Primary osteoarthritis, unspecified site: Secondary | ICD-10-CM | POA: Diagnosis not present

## 2017-08-02 DIAGNOSIS — E1122 Type 2 diabetes mellitus with diabetic chronic kidney disease: Secondary | ICD-10-CM | POA: Diagnosis not present

## 2017-08-02 DIAGNOSIS — Z7984 Long term (current) use of oral hypoglycemic drugs: Secondary | ICD-10-CM | POA: Diagnosis not present

## 2017-08-07 NOTE — Progress Notes (Signed)
Patient ID: [name], female   DOB: 1924/12/10, 84 y.o.   MRN: [id number]   84 y.o. anterior MI 07/07/11 with delayed presentation and VSD. CABG with SVG to D1 and LAD and VSD patch. EF 30-35% mild AR/MR echo 2015  Subsequent thymoma requiring surgery.   Back surgery with Dr Ronnald Ramp uneventful  08/20/15 Left L4-5 hemilaminectomy, medial facetectomy, and foraminotomy followed by microdiscectomy  No clinical CHF or chest pain had a nice picture of herself hugging one of my brothers mortgage signs.    ROS: Denies fever, malais, weight loss, blurry vision, decreased visual acuity, cough, sputum, SOB, hemoptysis, pleuritic pain, palpitaitons, heartburn, abdominal pain, melena, lower extremity edema, claudication, or rash.  All other systems reviewed and negative  General: BP 112/60   Pulse 84   Ht 5\' 1"  (1.549 m)   Wt 152 lb 12 oz (69.3 kg)   BMI 28.86 kg/m  Affect appropriate Healthy:  appears stated age 73: normal Neck supple with no adenopathy JVP normal no bruits no thyromegaly Lungs clear with no wheezing and good diaphragmatic motion Heart:  S1/S2 SEM  murmur, no rub, gallop or click PMI enlarged  Abdomen: benighn, BS positve, no tenderness, no AAA no bruit.  No HSM or HJR Distal pulses intact with no bruits No edema Neuro non-focal Skin warm and dry No muscular weakness    Current Outpatient Medications  Medication Sig Dispense Refill  . aspirin EC 81 MG tablet Take 81 mg by mouth daily.    . carvedilol (COREG) 6.25 MG tablet Take 1 tablet (6.25 mg total) by mouth 2 (two) times daily with a meal. 180 tablet 3  . cholecalciferol (VITAMIN D) 1000 units tablet Take 1,000 Units by mouth daily.    . furosemide (LASIX) 20 MG tablet Take 20 mg by mouth daily.     Marland Kitchen HYDROcodone-acetaminophen (NORCO/VICODIN) 5-325 MG tablet Take 1-2 tablets by mouth every 6 hours as needed for pain. 13 tablet 0  . Ibuprofen-Diphenhydramine HCl (ADVIL PM) 200-25 MG CAPS Take 1 tablet by mouth  at bedtime. sleep    . levothyroxine (SYNTHROID, LEVOTHROID) 50 MCG tablet Take 25 mcg by mouth daily before breakfast.     . linagliptin (TRADJENTA) 5 MG TABS tablet Take 2.5 mg by mouth daily.     Marland Kitchen losartan (COZAAR) 100 MG tablet Take 100 mg by mouth daily.  2  . Menthol-Methyl Salicylate (MUSCLE RUB) 10-15 % CREA Apply 1 application topically at bedtime.    . metFORMIN (GLUCOPHAGE) 500 MG tablet Take 500 mg by mouth 2 (two) times daily with a meal.     . naproxen sodium (ALEVE) 220 MG tablet Take 220 mg by mouth 2 (two) times daily as needed. Pain    . omeprazole (PRILOSEC) 20 MG capsule Take 20 mg by mouth daily.     Marland Kitchen oxybutynin (DITROPAN) 5 MG tablet Take 5 mg by mouth daily.     . simvastatin (ZOCOR) 40 MG tablet Take 40 mg by mouth every evening.     No current facility-administered medications for this visit.     Allergies  Amoxapine and related and Clozapine  Electrocardiogram:  03/05/14  SR rate 83  PR 240  Old anterolateral MI   04/26/15  SR rate 82  Old anterolateral MI  07/14/15 SR rate 110 old anterior infarct LAE no changes   Assessment and Plan CAD:  Stable with no angina and good activity level.  Continue medical Rx VSD:  Patch closure at time of  CABG  07/2011  Echo with intact patch Thymoma:  In remission post XRT no palpable neck masses Chol:  On statin labs with primary  Edema:  Dependant venous continue low dose lasix CHF:  Euvolemic EF 35% given age no AICD continue current medical Rx  Lumbago:  Post laminectomy with good pain relief f/u Dr Vickie Epley

## 2017-08-09 ENCOUNTER — Ambulatory Visit: Payer: Medicare HMO | Admitting: Cardiovascular Disease

## 2017-08-09 ENCOUNTER — Encounter: Payer: Self-pay | Admitting: Cardiovascular Disease

## 2017-08-09 VITALS — BP 112/60 | HR 84 | Ht 61.0 in | Wt 152.8 lb

## 2017-08-09 DIAGNOSIS — I5022 Chronic systolic (congestive) heart failure: Secondary | ICD-10-CM | POA: Diagnosis not present

## 2017-08-09 NOTE — Patient Instructions (Signed)

## 2017-09-04 DIAGNOSIS — E039 Hypothyroidism, unspecified: Secondary | ICD-10-CM | POA: Diagnosis not present

## 2017-09-04 DIAGNOSIS — I10 Essential (primary) hypertension: Secondary | ICD-10-CM | POA: Diagnosis not present

## 2017-09-04 DIAGNOSIS — R739 Hyperglycemia, unspecified: Secondary | ICD-10-CM | POA: Diagnosis not present

## 2017-09-05 DIAGNOSIS — N3941 Urge incontinence: Secondary | ICD-10-CM | POA: Diagnosis not present

## 2017-09-05 DIAGNOSIS — N3020 Other chronic cystitis without hematuria: Secondary | ICD-10-CM | POA: Diagnosis not present

## 2017-09-11 DIAGNOSIS — E039 Hypothyroidism, unspecified: Secondary | ICD-10-CM | POA: Diagnosis not present

## 2017-09-11 DIAGNOSIS — I10 Essential (primary) hypertension: Secondary | ICD-10-CM | POA: Diagnosis not present

## 2017-09-11 DIAGNOSIS — E7800 Pure hypercholesterolemia, unspecified: Secondary | ICD-10-CM | POA: Diagnosis not present

## 2017-09-11 DIAGNOSIS — E119 Type 2 diabetes mellitus without complications: Secondary | ICD-10-CM | POA: Diagnosis not present

## 2017-09-12 ENCOUNTER — Ambulatory Visit (INDEPENDENT_AMBULATORY_CARE_PROVIDER_SITE_OTHER): Payer: Medicare HMO | Admitting: Podiatry

## 2017-09-12 DIAGNOSIS — I83015 Varicose veins of right lower extremity with ulcer other part of foot: Secondary | ICD-10-CM

## 2017-09-12 DIAGNOSIS — L97519 Non-pressure chronic ulcer of other part of right foot with unspecified severity: Secondary | ICD-10-CM | POA: Diagnosis not present

## 2017-09-12 DIAGNOSIS — L97512 Non-pressure chronic ulcer of other part of right foot with fat layer exposed: Secondary | ICD-10-CM | POA: Diagnosis not present

## 2017-09-17 NOTE — Progress Notes (Signed)
   Subjective:  84 year old female presenting today with a chief complaint of an ulceration to the right fourth toe that has been present for a few weeks. She reports associated soreness of the area. Touching the area or applying pressure increases the pain. She has not done anything to treat the symptoms. Patient is here for further evaluation and treatment.   Past Medical History:  Diagnosis Date  . Acute MI, anterior wall ([place])    a. 07/07/2011  . CAD (coronary artery disease)    a. 07/07/2011 anterior MI- Occluded LAD w/ emergent CABG x 2 (SVG->LAD->D1).  . 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 Radiation initiated 09/2011  . Hemorrhoid   . History of radiation therapy 09/05/11-10/12/11   thyoma  . HTN (hypertension)   . Hypothyroidism   . Ischemic cardiomyopathy    a. 07/17/11 Echo: EF 35%;  b. 09/19/2011 Echo: EF 30-35%, Mid-Dist AntSept & Apical AK, mild MR.  Small residual VSD.  Marland Kitchen Osteoarthritis of knee    Bilateral.  a. s/p LTKA ~2007;  b. s/p RTKA 10/2010  . Seasonal allergies   . Shortness of breath    recently SOB  . VSD (ventricular septal defect)    a.  07/07/2011 - Noted @ time of MI - s/p repair @ time of CABG;  b. 09/19/2011 Echo: small residula VSD (present on 2/11 echo also)     Objective/Physical Exam General: The patient is alert and oriented x3 in no acute distress.  Dermatology:  Wound #1 noted to the right fourth digit measuring 0.1 x 0.1 x 0.1 cm (LxWxD).   To the noted ulceration(s), there is no eschar. There is a moderate amount of slough, fibrin, and necrotic tissue noted. Granulation tissue and wound base is red. There is a minimal amount of serosanguineous drainage noted. There is no exposed bone muscle-tendon ligament or joint. There is no malodor. Periwound integrity is intact. Skin is warm, dry and supple bilateral lower extremities.  Vascular: Palpable pedal pulses bilaterally. Mild edema noted. Capillary refill within  normal limits. Varicosities noted bilateral lower extremities.   Neurological: Epicritic and protective threshold diminished bilaterally.   Musculoskeletal Exam: Range of motion within normal limits to all pedal and ankle joints bilateral. Muscle strength 5/5 in all groups bilateral.   Assessment: #1 ulceration of the right fourth digit secondary to venous insufficiency #2 varicosities bilateral lower extremities  Plan of Care:  #1 Patient was evaluated. #2 medically necessary excisional debridement including subcutaneous tissue was performed using a tissue nipper and a chisel blade. Excisional debridement of all the necrotic nonviable tissue down to healthy bleeding viable tissue was performed with post-debridement measurements same as pre-. #3 the wound was cleansed with normal saline. #4 Recommended applying antibiotic ointment daily with a Band-Aid.  #5 Silicone toe cap dispensed.  #6 Return to clinic at next routine care appointment.   Edrick Kins, DPM [facility]  Dr. Edrick Kins, [address]                                        [place], [place] [address]                Office [phone]  Fax [phone]

## 2017-10-02 DIAGNOSIS — N3941 Urge incontinence: Secondary | ICD-10-CM | POA: Diagnosis not present

## 2017-10-02 DIAGNOSIS — N3020 Other chronic cystitis without hematuria: Secondary | ICD-10-CM | POA: Diagnosis not present

## 2017-10-02 DIAGNOSIS — R300 Dysuria: Secondary | ICD-10-CM | POA: Diagnosis not present

## 2017-10-23 DIAGNOSIS — N3000 Acute cystitis without hematuria: Secondary | ICD-10-CM | POA: Diagnosis not present

## 2017-10-23 DIAGNOSIS — R350 Frequency of micturition: Secondary | ICD-10-CM | POA: Diagnosis not present

## 2017-10-23 DIAGNOSIS — N3941 Urge incontinence: Secondary | ICD-10-CM | POA: Diagnosis not present

## 2017-11-08 ENCOUNTER — Encounter: Payer: Self-pay | Admitting: Podiatry

## 2017-11-08 ENCOUNTER — Ambulatory Visit (INDEPENDENT_AMBULATORY_CARE_PROVIDER_SITE_OTHER): Payer: Medicare HMO | Admitting: Podiatry

## 2017-11-08 DIAGNOSIS — E1142 Type 2 diabetes mellitus with diabetic polyneuropathy: Secondary | ICD-10-CM

## 2017-11-08 DIAGNOSIS — M79675 Pain in left toe(s): Secondary | ICD-10-CM

## 2017-11-08 DIAGNOSIS — M79674 Pain in right toe(s): Secondary | ICD-10-CM

## 2017-11-08 DIAGNOSIS — B351 Tinea unguium: Secondary | ICD-10-CM | POA: Diagnosis not present

## 2017-11-08 NOTE — Progress Notes (Signed)
She presents today with chief complaint of painful elongated toenails 1 through 5 bilaterally.  Objective: Toenails are long thick yellow dystrophic-like mycotic no open lesions or wounds.  Assessment: Pain in limb secondary to onychomycosis.  Plan: Debridement of toenails 1 through 5 bilateral.

## 2017-11-14 DIAGNOSIS — E119 Type 2 diabetes mellitus without complications: Secondary | ICD-10-CM | POA: Diagnosis not present

## 2017-11-14 DIAGNOSIS — Z961 Presence of intraocular lens: Secondary | ICD-10-CM | POA: Diagnosis not present

## 2017-11-14 DIAGNOSIS — H26492 Other secondary cataract, left eye: Secondary | ICD-10-CM | POA: Diagnosis not present

## 2017-11-21 DIAGNOSIS — H903 Sensorineural hearing loss, bilateral: Secondary | ICD-10-CM | POA: Diagnosis not present

## 2017-11-21 DIAGNOSIS — H6123 Impacted cerumen, bilateral: Secondary | ICD-10-CM | POA: Diagnosis not present

## 2017-12-12 DIAGNOSIS — H6123 Impacted cerumen, bilateral: Secondary | ICD-10-CM | POA: Diagnosis not present

## 2017-12-19 DIAGNOSIS — R05 Cough: Secondary | ICD-10-CM | POA: Diagnosis not present

## 2017-12-19 DIAGNOSIS — J309 Allergic rhinitis, unspecified: Secondary | ICD-10-CM | POA: Diagnosis not present

## 2018-01-02 DIAGNOSIS — R69 Illness, unspecified: Secondary | ICD-10-CM | POA: Diagnosis not present

## 2018-01-07 DIAGNOSIS — E119 Type 2 diabetes mellitus without complications: Secondary | ICD-10-CM | POA: Diagnosis not present

## 2018-01-07 DIAGNOSIS — I10 Essential (primary) hypertension: Secondary | ICD-10-CM | POA: Diagnosis not present

## 2018-01-07 DIAGNOSIS — E039 Hypothyroidism, unspecified: Secondary | ICD-10-CM | POA: Diagnosis not present

## 2018-01-24 DIAGNOSIS — I10 Essential (primary) hypertension: Secondary | ICD-10-CM | POA: Diagnosis not present

## 2018-01-24 DIAGNOSIS — E119 Type 2 diabetes mellitus without complications: Secondary | ICD-10-CM | POA: Diagnosis not present

## 2018-01-24 DIAGNOSIS — E039 Hypothyroidism, unspecified: Secondary | ICD-10-CM | POA: Diagnosis not present

## 2018-01-30 DIAGNOSIS — M1711 Unilateral primary osteoarthritis, right knee: Secondary | ICD-10-CM | POA: Diagnosis not present

## 2018-01-30 DIAGNOSIS — M25562 Pain in left knee: Secondary | ICD-10-CM | POA: Diagnosis not present

## 2018-01-30 DIAGNOSIS — E1122 Type 2 diabetes mellitus with diabetic chronic kidney disease: Secondary | ICD-10-CM | POA: Diagnosis not present

## 2018-01-30 DIAGNOSIS — I129 Hypertensive chronic kidney disease with stage 1 through stage 4 chronic kidney disease, or unspecified chronic kidney disease: Secondary | ICD-10-CM | POA: Diagnosis not present

## 2018-01-30 DIAGNOSIS — N183 Chronic kidney disease, stage 3 (moderate): Secondary | ICD-10-CM | POA: Diagnosis not present

## 2018-01-30 DIAGNOSIS — I2510 Atherosclerotic heart disease of native coronary artery without angina pectoris: Secondary | ICD-10-CM | POA: Diagnosis not present

## 2018-01-30 DIAGNOSIS — G8929 Other chronic pain: Secondary | ICD-10-CM | POA: Diagnosis not present

## 2018-01-30 DIAGNOSIS — E039 Hypothyroidism, unspecified: Secondary | ICD-10-CM | POA: Diagnosis not present

## 2018-01-30 NOTE — Progress Notes (Signed)
Patient ID: [name], female   DOB: 1924-08-14, 84 y.o.   MRN: [id number]   84 y.o. anterior MI 07/07/11 with delayed presentation and VSD. CABG with SVG to D1 and LAD and VSD patch. EF 30-35% mild AR/MR echo 2015  Subsequent thymoma requiring surgery.   Back surgery with Dr Ronnald Ramp uneventful  08/20/15 Left L4-5 hemilaminectomy, medial facetectomy, and foraminotomy followed by microdiscectomy  No clinical CHF or chest pain had a nice picture of herself hugging one of my brothers mortgage signs.  Still living at home independently Daughter looks in on her daily Not driving last 2 years   ROS: Denies fever, malais, weight loss, blurry vision, decreased visual acuity, cough, sputum, SOB, hemoptysis, pleuritic pain, palpitaitons, heartburn, abdominal pain, melena, lower extremity edema, claudication, or rash.  All other systems reviewed and negative  General: BP (!) 120/58   Pulse 92   Ht 5\' 1"  (1.549 m)   Wt 151 lb 8 oz (68.7 kg)   SpO2 98%   BMI 28.63 kg/m  Affect appropriate Healthy:  appears stated age 84: normal Neck supple with no adenopathy JVP normal no bruits no thyromegaly Lungs clear with no wheezing and good diaphragmatic motion Heart:  S1/S2 SEM  murmur, no rub, gallop or click PMI enlarged  Abdomen: benighn, BS positve, no tenderness, no AAA no bruit.  No HSM or HJR Distal pulses intact with no bruits No edema Neuro non-focal Skin warm and dry No muscular weakness Post laminectomy lumbar     Current Outpatient Medications  Medication Sig Dispense Refill  . aspirin EC 81 MG tablet Take 81 mg by mouth daily.    . carvedilol (COREG) 6.25 MG tablet Take 1 tablet (6.25 mg total) by mouth 2 (two) times daily with a meal. 180 tablet 3  . cholecalciferol (VITAMIN D) 1000 units tablet Take 1,000 Units by mouth daily.    . furosemide (LASIX) 20 MG tablet Take 20 mg by mouth daily.     Marland Kitchen HYDROcodone-acetaminophen (NORCO/VICODIN) 5-325 MG tablet Take 1-2 tablets by  mouth every 6 hours as needed for pain. 13 tablet 0  . Ibuprofen-Diphenhydramine HCl (ADVIL PM) 200-25 MG CAPS Take 1 tablet by mouth at bedtime. sleep    . levothyroxine (SYNTHROID, LEVOTHROID) 50 MCG tablet Take 25 mcg by mouth daily before breakfast.     . linagliptin (TRADJENTA) 5 MG TABS tablet Take 2.5 mg by mouth daily.     Marland Kitchen losartan (COZAAR) 100 MG tablet Take 100 mg by mouth daily.  2  . metFORMIN (GLUCOPHAGE) 500 MG tablet Take 500 mg by mouth 2 (two) times daily with a meal.     . naproxen sodium (ALEVE) 220 MG tablet Take 220 mg by mouth 2 (two) times daily as needed. Pain    . omeprazole (PRILOSEC) 20 MG capsule Take 20 mg by mouth daily.     Marland Kitchen oxybutynin (DITROPAN) 5 MG tablet Take 5 mg by mouth daily.     . simvastatin (ZOCOR) 40 MG tablet Take 40 mg by mouth every evening.     No current facility-administered medications for this visit.     Allergies  Amoxapine and related and Clozapine  Electrocardiogram:  03/05/14  SR rate 83  PR 240  Old anterolateral MI   04/26/15  SR rate 82  Old anterolateral MI  07/14/15 SR rate 32 old anterior infarct LAE no changes   Assessment and Plan CAD:  Stable with no angina and good activity level.  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

## 2018-02-06 ENCOUNTER — Encounter: Payer: Self-pay | Admitting: Cardiovascular Disease

## 2018-02-06 ENCOUNTER — Ambulatory Visit: Payer: Medicare HMO | Admitting: Cardiovascular Disease

## 2018-02-06 VITALS — BP 120/58 | HR 92 | Ht 61.0 in | Wt 151.5 lb

## 2018-02-06 DIAGNOSIS — I5022 Chronic systolic (congestive) heart failure: Secondary | ICD-10-CM | POA: Diagnosis not present

## 2018-02-06 DIAGNOSIS — Z951 Presence of aortocoronary bypass graft: Secondary | ICD-10-CM | POA: Diagnosis not present

## 2018-02-06 DIAGNOSIS — I10 Essential (primary) hypertension: Secondary | ICD-10-CM

## 2018-02-06 NOTE — Patient Instructions (Addendum)

## 2018-02-07 ENCOUNTER — Encounter: Payer: Self-pay | Admitting: Podiatry

## 2018-02-07 ENCOUNTER — Ambulatory Visit: Payer: Medicare HMO | Admitting: Podiatry

## 2018-02-07 DIAGNOSIS — E1142 Type 2 diabetes mellitus with diabetic polyneuropathy: Secondary | ICD-10-CM | POA: Diagnosis not present

## 2018-02-07 DIAGNOSIS — M79675 Pain in left toe(s): Secondary | ICD-10-CM | POA: Diagnosis not present

## 2018-02-07 DIAGNOSIS — B351 Tinea unguium: Secondary | ICD-10-CM | POA: Diagnosis not present

## 2018-02-07 DIAGNOSIS — M79674 Pain in right toe(s): Secondary | ICD-10-CM | POA: Diagnosis not present

## 2018-02-09 NOTE — Progress Notes (Signed)
She presents today chief complaint of painful elongated toenails and calluses.  Objective: Vital signs are stable she is alert oriented x3 nails are long thick yellow dystrophic-like mycotic multiple reactive hyper keratomas plantar aspect of bilateral foot.  Assessment: Pain in limb secondary onychomycosis porokeratosis.  Plan: Debrided all reactive hyperkeratotic tissue debrided toenails 1 through 5 bilateral.

## 2018-02-11 DIAGNOSIS — N183 Chronic kidney disease, stage 3 (moderate): Secondary | ICD-10-CM | POA: Diagnosis not present

## 2018-02-11 DIAGNOSIS — E1122 Type 2 diabetes mellitus with diabetic chronic kidney disease: Secondary | ICD-10-CM | POA: Diagnosis not present

## 2018-02-11 DIAGNOSIS — M1711 Unilateral primary osteoarthritis, right knee: Secondary | ICD-10-CM | POA: Diagnosis not present

## 2018-02-11 DIAGNOSIS — I129 Hypertensive chronic kidney disease with stage 1 through stage 4 chronic kidney disease, or unspecified chronic kidney disease: Secondary | ICD-10-CM | POA: Diagnosis not present

## 2018-02-11 DIAGNOSIS — M25562 Pain in left knee: Secondary | ICD-10-CM | POA: Diagnosis not present

## 2018-02-11 DIAGNOSIS — G8929 Other chronic pain: Secondary | ICD-10-CM | POA: Diagnosis not present

## 2018-02-11 DIAGNOSIS — E039 Hypothyroidism, unspecified: Secondary | ICD-10-CM | POA: Diagnosis not present

## 2018-02-11 DIAGNOSIS — I2510 Atherosclerotic heart disease of native coronary artery without angina pectoris: Secondary | ICD-10-CM | POA: Diagnosis not present

## 2018-02-13 DIAGNOSIS — E039 Hypothyroidism, unspecified: Secondary | ICD-10-CM | POA: Diagnosis not present

## 2018-02-13 DIAGNOSIS — I129 Hypertensive chronic kidney disease with stage 1 through stage 4 chronic kidney disease, or unspecified chronic kidney disease: Secondary | ICD-10-CM | POA: Diagnosis not present

## 2018-02-13 DIAGNOSIS — M1711 Unilateral primary osteoarthritis, right knee: Secondary | ICD-10-CM | POA: Diagnosis not present

## 2018-02-13 DIAGNOSIS — M25562 Pain in left knee: Secondary | ICD-10-CM | POA: Diagnosis not present

## 2018-02-13 DIAGNOSIS — N183 Chronic kidney disease, stage 3 (moderate): Secondary | ICD-10-CM | POA: Diagnosis not present

## 2018-02-13 DIAGNOSIS — E1122 Type 2 diabetes mellitus with diabetic chronic kidney disease: Secondary | ICD-10-CM | POA: Diagnosis not present

## 2018-02-13 DIAGNOSIS — G8929 Other chronic pain: Secondary | ICD-10-CM | POA: Diagnosis not present

## 2018-02-13 DIAGNOSIS — I2510 Atherosclerotic heart disease of native coronary artery without angina pectoris: Secondary | ICD-10-CM | POA: Diagnosis not present

## 2018-02-18 DIAGNOSIS — I129 Hypertensive chronic kidney disease with stage 1 through stage 4 chronic kidney disease, or unspecified chronic kidney disease: Secondary | ICD-10-CM | POA: Diagnosis not present

## 2018-02-18 DIAGNOSIS — I2510 Atherosclerotic heart disease of native coronary artery without angina pectoris: Secondary | ICD-10-CM | POA: Diagnosis not present

## 2018-02-18 DIAGNOSIS — E039 Hypothyroidism, unspecified: Secondary | ICD-10-CM | POA: Diagnosis not present

## 2018-02-18 DIAGNOSIS — M1711 Unilateral primary osteoarthritis, right knee: Secondary | ICD-10-CM | POA: Diagnosis not present

## 2018-02-18 DIAGNOSIS — N183 Chronic kidney disease, stage 3 (moderate): Secondary | ICD-10-CM | POA: Diagnosis not present

## 2018-02-18 DIAGNOSIS — E1122 Type 2 diabetes mellitus with diabetic chronic kidney disease: Secondary | ICD-10-CM | POA: Diagnosis not present

## 2018-02-18 DIAGNOSIS — G8929 Other chronic pain: Secondary | ICD-10-CM | POA: Diagnosis not present

## 2018-02-18 DIAGNOSIS — M25562 Pain in left knee: Secondary | ICD-10-CM | POA: Diagnosis not present

## 2018-02-20 DIAGNOSIS — I129 Hypertensive chronic kidney disease with stage 1 through stage 4 chronic kidney disease, or unspecified chronic kidney disease: Secondary | ICD-10-CM | POA: Diagnosis not present

## 2018-02-20 DIAGNOSIS — E039 Hypothyroidism, unspecified: Secondary | ICD-10-CM | POA: Diagnosis not present

## 2018-02-20 DIAGNOSIS — M1711 Unilateral primary osteoarthritis, right knee: Secondary | ICD-10-CM | POA: Diagnosis not present

## 2018-02-20 DIAGNOSIS — E1122 Type 2 diabetes mellitus with diabetic chronic kidney disease: Secondary | ICD-10-CM | POA: Diagnosis not present

## 2018-02-20 DIAGNOSIS — M25562 Pain in left knee: Secondary | ICD-10-CM | POA: Diagnosis not present

## 2018-02-20 DIAGNOSIS — I2510 Atherosclerotic heart disease of native coronary artery without angina pectoris: Secondary | ICD-10-CM | POA: Diagnosis not present

## 2018-02-20 DIAGNOSIS — G8929 Other chronic pain: Secondary | ICD-10-CM | POA: Diagnosis not present

## 2018-02-20 DIAGNOSIS — N183 Chronic kidney disease, stage 3 (moderate): Secondary | ICD-10-CM | POA: Diagnosis not present

## 2018-02-25 DIAGNOSIS — I129 Hypertensive chronic kidney disease with stage 1 through stage 4 chronic kidney disease, or unspecified chronic kidney disease: Secondary | ICD-10-CM | POA: Diagnosis not present

## 2018-02-25 DIAGNOSIS — G8929 Other chronic pain: Secondary | ICD-10-CM | POA: Diagnosis not present

## 2018-02-25 DIAGNOSIS — M25562 Pain in left knee: Secondary | ICD-10-CM | POA: Diagnosis not present

## 2018-02-25 DIAGNOSIS — N183 Chronic kidney disease, stage 3 (moderate): Secondary | ICD-10-CM | POA: Diagnosis not present

## 2018-02-25 DIAGNOSIS — E1122 Type 2 diabetes mellitus with diabetic chronic kidney disease: Secondary | ICD-10-CM | POA: Diagnosis not present

## 2018-02-25 DIAGNOSIS — I2510 Atherosclerotic heart disease of native coronary artery without angina pectoris: Secondary | ICD-10-CM | POA: Diagnosis not present

## 2018-02-25 DIAGNOSIS — E039 Hypothyroidism, unspecified: Secondary | ICD-10-CM | POA: Diagnosis not present

## 2018-02-25 DIAGNOSIS — M1711 Unilateral primary osteoarthritis, right knee: Secondary | ICD-10-CM | POA: Diagnosis not present

## 2018-02-26 DIAGNOSIS — H722X1 Other marginal perforations of tympanic membrane, right ear: Secondary | ICD-10-CM | POA: Diagnosis not present

## 2018-02-26 DIAGNOSIS — H6123 Impacted cerumen, bilateral: Secondary | ICD-10-CM | POA: Diagnosis not present

## 2018-02-27 DIAGNOSIS — E1122 Type 2 diabetes mellitus with diabetic chronic kidney disease: Secondary | ICD-10-CM | POA: Diagnosis not present

## 2018-02-27 DIAGNOSIS — N183 Chronic kidney disease, stage 3 (moderate): Secondary | ICD-10-CM | POA: Diagnosis not present

## 2018-02-27 DIAGNOSIS — G8929 Other chronic pain: Secondary | ICD-10-CM | POA: Diagnosis not present

## 2018-02-27 DIAGNOSIS — I129 Hypertensive chronic kidney disease with stage 1 through stage 4 chronic kidney disease, or unspecified chronic kidney disease: Secondary | ICD-10-CM | POA: Diagnosis not present

## 2018-02-27 DIAGNOSIS — M1711 Unilateral primary osteoarthritis, right knee: Secondary | ICD-10-CM | POA: Diagnosis not present

## 2018-02-27 DIAGNOSIS — E039 Hypothyroidism, unspecified: Secondary | ICD-10-CM | POA: Diagnosis not present

## 2018-02-27 DIAGNOSIS — M25562 Pain in left knee: Secondary | ICD-10-CM | POA: Diagnosis not present

## 2018-02-27 DIAGNOSIS — I2510 Atherosclerotic heart disease of native coronary artery without angina pectoris: Secondary | ICD-10-CM | POA: Diagnosis not present

## 2018-03-03 DIAGNOSIS — R69 Illness, unspecified: Secondary | ICD-10-CM | POA: Diagnosis not present

## 2018-03-04 DIAGNOSIS — I129 Hypertensive chronic kidney disease with stage 1 through stage 4 chronic kidney disease, or unspecified chronic kidney disease: Secondary | ICD-10-CM | POA: Diagnosis not present

## 2018-03-04 DIAGNOSIS — N183 Chronic kidney disease, stage 3 (moderate): Secondary | ICD-10-CM | POA: Diagnosis not present

## 2018-03-04 DIAGNOSIS — M25562 Pain in left knee: Secondary | ICD-10-CM | POA: Diagnosis not present

## 2018-03-04 DIAGNOSIS — E1122 Type 2 diabetes mellitus with diabetic chronic kidney disease: Secondary | ICD-10-CM | POA: Diagnosis not present

## 2018-03-04 DIAGNOSIS — M1711 Unilateral primary osteoarthritis, right knee: Secondary | ICD-10-CM | POA: Diagnosis not present

## 2018-03-04 DIAGNOSIS — E039 Hypothyroidism, unspecified: Secondary | ICD-10-CM | POA: Diagnosis not present

## 2018-03-04 DIAGNOSIS — I2510 Atherosclerotic heart disease of native coronary artery without angina pectoris: Secondary | ICD-10-CM | POA: Diagnosis not present

## 2018-03-04 DIAGNOSIS — G8929 Other chronic pain: Secondary | ICD-10-CM | POA: Diagnosis not present

## 2018-03-07 DIAGNOSIS — E039 Hypothyroidism, unspecified: Secondary | ICD-10-CM | POA: Diagnosis not present

## 2018-03-07 DIAGNOSIS — I2510 Atherosclerotic heart disease of native coronary artery without angina pectoris: Secondary | ICD-10-CM | POA: Diagnosis not present

## 2018-03-07 DIAGNOSIS — M25562 Pain in left knee: Secondary | ICD-10-CM | POA: Diagnosis not present

## 2018-03-07 DIAGNOSIS — G8929 Other chronic pain: Secondary | ICD-10-CM | POA: Diagnosis not present

## 2018-03-07 DIAGNOSIS — N183 Chronic kidney disease, stage 3 (moderate): Secondary | ICD-10-CM | POA: Diagnosis not present

## 2018-03-07 DIAGNOSIS — E1122 Type 2 diabetes mellitus with diabetic chronic kidney disease: Secondary | ICD-10-CM | POA: Diagnosis not present

## 2018-03-07 DIAGNOSIS — I129 Hypertensive chronic kidney disease with stage 1 through stage 4 chronic kidney disease, or unspecified chronic kidney disease: Secondary | ICD-10-CM | POA: Diagnosis not present

## 2018-03-07 DIAGNOSIS — M1711 Unilateral primary osteoarthritis, right knee: Secondary | ICD-10-CM | POA: Diagnosis not present

## 2018-03-11 DIAGNOSIS — G8929 Other chronic pain: Secondary | ICD-10-CM | POA: Diagnosis not present

## 2018-03-11 DIAGNOSIS — I129 Hypertensive chronic kidney disease with stage 1 through stage 4 chronic kidney disease, or unspecified chronic kidney disease: Secondary | ICD-10-CM | POA: Diagnosis not present

## 2018-03-11 DIAGNOSIS — M1711 Unilateral primary osteoarthritis, right knee: Secondary | ICD-10-CM | POA: Diagnosis not present

## 2018-03-11 DIAGNOSIS — E039 Hypothyroidism, unspecified: Secondary | ICD-10-CM | POA: Diagnosis not present

## 2018-03-11 DIAGNOSIS — N183 Chronic kidney disease, stage 3 (moderate): Secondary | ICD-10-CM | POA: Diagnosis not present

## 2018-03-11 DIAGNOSIS — I2510 Atherosclerotic heart disease of native coronary artery without angina pectoris: Secondary | ICD-10-CM | POA: Diagnosis not present

## 2018-03-11 DIAGNOSIS — M25562 Pain in left knee: Secondary | ICD-10-CM | POA: Diagnosis not present

## 2018-03-11 DIAGNOSIS — E1122 Type 2 diabetes mellitus with diabetic chronic kidney disease: Secondary | ICD-10-CM | POA: Diagnosis not present

## 2018-03-13 DIAGNOSIS — I2510 Atherosclerotic heart disease of native coronary artery without angina pectoris: Secondary | ICD-10-CM | POA: Diagnosis not present

## 2018-03-13 DIAGNOSIS — G8929 Other chronic pain: Secondary | ICD-10-CM | POA: Diagnosis not present

## 2018-03-13 DIAGNOSIS — E039 Hypothyroidism, unspecified: Secondary | ICD-10-CM | POA: Diagnosis not present

## 2018-03-13 DIAGNOSIS — I129 Hypertensive chronic kidney disease with stage 1 through stage 4 chronic kidney disease, or unspecified chronic kidney disease: Secondary | ICD-10-CM | POA: Diagnosis not present

## 2018-03-13 DIAGNOSIS — N183 Chronic kidney disease, stage 3 (moderate): Secondary | ICD-10-CM | POA: Diagnosis not present

## 2018-03-13 DIAGNOSIS — M1711 Unilateral primary osteoarthritis, right knee: Secondary | ICD-10-CM | POA: Diagnosis not present

## 2018-03-13 DIAGNOSIS — M25562 Pain in left knee: Secondary | ICD-10-CM | POA: Diagnosis not present

## 2018-03-13 DIAGNOSIS — E1122 Type 2 diabetes mellitus with diabetic chronic kidney disease: Secondary | ICD-10-CM | POA: Diagnosis not present

## 2018-03-19 DIAGNOSIS — I129 Hypertensive chronic kidney disease with stage 1 through stage 4 chronic kidney disease, or unspecified chronic kidney disease: Secondary | ICD-10-CM | POA: Diagnosis not present

## 2018-03-19 DIAGNOSIS — E039 Hypothyroidism, unspecified: Secondary | ICD-10-CM | POA: Diagnosis not present

## 2018-03-19 DIAGNOSIS — G8929 Other chronic pain: Secondary | ICD-10-CM | POA: Diagnosis not present

## 2018-03-19 DIAGNOSIS — M25562 Pain in left knee: Secondary | ICD-10-CM | POA: Diagnosis not present

## 2018-03-19 DIAGNOSIS — I2510 Atherosclerotic heart disease of native coronary artery without angina pectoris: Secondary | ICD-10-CM | POA: Diagnosis not present

## 2018-03-19 DIAGNOSIS — N183 Chronic kidney disease, stage 3 (moderate): Secondary | ICD-10-CM | POA: Diagnosis not present

## 2018-03-19 DIAGNOSIS — M1711 Unilateral primary osteoarthritis, right knee: Secondary | ICD-10-CM | POA: Diagnosis not present

## 2018-03-19 DIAGNOSIS — E1122 Type 2 diabetes mellitus with diabetic chronic kidney disease: Secondary | ICD-10-CM | POA: Diagnosis not present

## 2018-03-21 DIAGNOSIS — N183 Chronic kidney disease, stage 3 (moderate): Secondary | ICD-10-CM | POA: Diagnosis not present

## 2018-03-21 DIAGNOSIS — M25562 Pain in left knee: Secondary | ICD-10-CM | POA: Diagnosis not present

## 2018-03-21 DIAGNOSIS — I2510 Atherosclerotic heart disease of native coronary artery without angina pectoris: Secondary | ICD-10-CM | POA: Diagnosis not present

## 2018-03-21 DIAGNOSIS — E1122 Type 2 diabetes mellitus with diabetic chronic kidney disease: Secondary | ICD-10-CM | POA: Diagnosis not present

## 2018-03-21 DIAGNOSIS — E039 Hypothyroidism, unspecified: Secondary | ICD-10-CM | POA: Diagnosis not present

## 2018-03-21 DIAGNOSIS — G8929 Other chronic pain: Secondary | ICD-10-CM | POA: Diagnosis not present

## 2018-03-21 DIAGNOSIS — I129 Hypertensive chronic kidney disease with stage 1 through stage 4 chronic kidney disease, or unspecified chronic kidney disease: Secondary | ICD-10-CM | POA: Diagnosis not present

## 2018-03-21 DIAGNOSIS — M1711 Unilateral primary osteoarthritis, right knee: Secondary | ICD-10-CM | POA: Diagnosis not present

## 2018-03-25 DIAGNOSIS — E039 Hypothyroidism, unspecified: Secondary | ICD-10-CM | POA: Diagnosis not present

## 2018-03-25 DIAGNOSIS — G8929 Other chronic pain: Secondary | ICD-10-CM | POA: Diagnosis not present

## 2018-03-25 DIAGNOSIS — I129 Hypertensive chronic kidney disease with stage 1 through stage 4 chronic kidney disease, or unspecified chronic kidney disease: Secondary | ICD-10-CM | POA: Diagnosis not present

## 2018-03-25 DIAGNOSIS — M25562 Pain in left knee: Secondary | ICD-10-CM | POA: Diagnosis not present

## 2018-03-25 DIAGNOSIS — M1711 Unilateral primary osteoarthritis, right knee: Secondary | ICD-10-CM | POA: Diagnosis not present

## 2018-03-25 DIAGNOSIS — N183 Chronic kidney disease, stage 3 (moderate): Secondary | ICD-10-CM | POA: Diagnosis not present

## 2018-03-25 DIAGNOSIS — E1122 Type 2 diabetes mellitus with diabetic chronic kidney disease: Secondary | ICD-10-CM | POA: Diagnosis not present

## 2018-03-25 DIAGNOSIS — I2510 Atherosclerotic heart disease of native coronary artery without angina pectoris: Secondary | ICD-10-CM | POA: Diagnosis not present

## 2018-03-27 DIAGNOSIS — I2510 Atherosclerotic heart disease of native coronary artery without angina pectoris: Secondary | ICD-10-CM | POA: Diagnosis not present

## 2018-03-27 DIAGNOSIS — N183 Chronic kidney disease, stage 3 (moderate): Secondary | ICD-10-CM | POA: Diagnosis not present

## 2018-03-27 DIAGNOSIS — I129 Hypertensive chronic kidney disease with stage 1 through stage 4 chronic kidney disease, or unspecified chronic kidney disease: Secondary | ICD-10-CM | POA: Diagnosis not present

## 2018-03-27 DIAGNOSIS — M25562 Pain in left knee: Secondary | ICD-10-CM | POA: Diagnosis not present

## 2018-03-27 DIAGNOSIS — G8929 Other chronic pain: Secondary | ICD-10-CM | POA: Diagnosis not present

## 2018-03-27 DIAGNOSIS — M1711 Unilateral primary osteoarthritis, right knee: Secondary | ICD-10-CM | POA: Diagnosis not present

## 2018-03-27 DIAGNOSIS — E039 Hypothyroidism, unspecified: Secondary | ICD-10-CM | POA: Diagnosis not present

## 2018-03-27 DIAGNOSIS — E1122 Type 2 diabetes mellitus with diabetic chronic kidney disease: Secondary | ICD-10-CM | POA: Diagnosis not present

## 2018-04-11 ENCOUNTER — Encounter: Payer: Self-pay | Admitting: Podiatry

## 2018-04-11 ENCOUNTER — Ambulatory Visit: Payer: Medicare HMO | Admitting: Podiatry

## 2018-04-11 DIAGNOSIS — M79675 Pain in left toe(s): Secondary | ICD-10-CM

## 2018-04-11 DIAGNOSIS — M79674 Pain in right toe(s): Secondary | ICD-10-CM

## 2018-04-11 DIAGNOSIS — E1142 Type 2 diabetes mellitus with diabetic polyneuropathy: Secondary | ICD-10-CM | POA: Diagnosis not present

## 2018-04-11 DIAGNOSIS — B351 Tinea unguium: Secondary | ICD-10-CM

## 2018-04-11 NOTE — Progress Notes (Signed)
She presents today with her daughter with a chief complaint of painful elongated toenails bilaterally.  Objective: Pulses remain palpable.  Toenails are long thick yellow dystrophic-like mycotic painful palpation as well as debridement.  Assessment: Pain in limb secondary to onychomycosis.  Plan: Debridement of toenails 1 through 5 bilateral.

## 2018-05-15 DIAGNOSIS — E7800 Pure hypercholesterolemia, unspecified: Secondary | ICD-10-CM | POA: Diagnosis not present

## 2018-05-15 DIAGNOSIS — E118 Type 2 diabetes mellitus with unspecified complications: Secondary | ICD-10-CM | POA: Diagnosis not present

## 2018-05-15 DIAGNOSIS — E039 Hypothyroidism, unspecified: Secondary | ICD-10-CM | POA: Diagnosis not present

## 2018-05-15 DIAGNOSIS — I10 Essential (primary) hypertension: Secondary | ICD-10-CM | POA: Diagnosis not present

## 2018-05-22 DIAGNOSIS — R2681 Unsteadiness on feet: Secondary | ICD-10-CM | POA: Diagnosis not present

## 2018-05-22 DIAGNOSIS — L8941 Pressure ulcer of contiguous site of back, buttock and hip, stage 1: Secondary | ICD-10-CM | POA: Diagnosis not present

## 2018-05-22 DIAGNOSIS — E7800 Pure hypercholesterolemia, unspecified: Secondary | ICD-10-CM | POA: Diagnosis not present

## 2018-05-22 DIAGNOSIS — E119 Type 2 diabetes mellitus without complications: Secondary | ICD-10-CM | POA: Diagnosis not present

## 2018-05-22 DIAGNOSIS — I10 Essential (primary) hypertension: Secondary | ICD-10-CM | POA: Diagnosis not present

## 2018-05-22 DIAGNOSIS — M1990 Unspecified osteoarthritis, unspecified site: Secondary | ICD-10-CM | POA: Diagnosis not present

## 2018-05-22 DIAGNOSIS — R531 Weakness: Secondary | ICD-10-CM | POA: Diagnosis not present

## 2018-06-27 DIAGNOSIS — Z23 Encounter for immunization: Secondary | ICD-10-CM | POA: Diagnosis not present

## 2018-06-27 DIAGNOSIS — D485 Neoplasm of uncertain behavior of skin: Secondary | ICD-10-CM | POA: Diagnosis not present

## 2018-06-27 DIAGNOSIS — L89309 Pressure ulcer of unspecified buttock, unspecified stage: Secondary | ICD-10-CM | POA: Diagnosis not present

## 2018-06-27 DIAGNOSIS — L0889 Other specified local infections of the skin and subcutaneous tissue: Secondary | ICD-10-CM | POA: Diagnosis not present

## 2018-06-27 DIAGNOSIS — B079 Viral wart, unspecified: Secondary | ICD-10-CM | POA: Diagnosis not present

## 2018-07-08 DIAGNOSIS — R69 Illness, unspecified: Secondary | ICD-10-CM | POA: Diagnosis not present

## 2018-07-16 ENCOUNTER — Encounter: Payer: Self-pay | Admitting: Podiatry

## 2018-07-16 ENCOUNTER — Ambulatory Visit: Payer: Medicare HMO | Admitting: Podiatry

## 2018-07-16 DIAGNOSIS — M79674 Pain in right toe(s): Secondary | ICD-10-CM

## 2018-07-16 DIAGNOSIS — M79675 Pain in left toe(s): Secondary | ICD-10-CM | POA: Diagnosis not present

## 2018-07-16 DIAGNOSIS — B351 Tinea unguium: Secondary | ICD-10-CM | POA: Diagnosis not present

## 2018-07-16 NOTE — Patient Instructions (Signed)
Diabetic Neuropathy Diabetic neuropathy refers to nerve damage that is caused by diabetes (diabetes mellitus). Over time, people with diabetes can develop nerve damage throughout the body. There are several types of diabetic neuropathy:  Peripheral neuropathy. This is the most common type of diabetic neuropathy. It causes damage to nerves that carry signals between the spinal cord and other parts of the body (peripheral nerves). This usually affects nerves in the feet and legs first, and may eventually affect the hands and arms. The damage affects the ability to sense touch or temperature.  Autonomic neuropathy. This type causes damage to nerves that control involuntary functions (autonomic nerves). These nerves carry signals that control: ? Heartbeat. ? Body temperature. ? Blood pressure. ? Urination. ? Digestion. ? Sweating. ? Sexual function. ? Response to changing blood sugar (glucose) levels.  Focal neuropathy. This type of nerve damage affects one area of the body, such as an arm, a leg, or the face. The injury may involve one nerve or a small group of nerves. Focal neuropathy can be painful and unpredictable, and occurs most often in older adults with diabetes. This often develops suddenly, but usually improves over time and does not cause long-term problems.  Proximal neuropathy. This type of nerve damage affects the nerves of the thighs, hips, buttocks, or legs. It causes severe pain, weakness, and muscle death (atrophy), usually in the thigh muscles. It is more common among older men and people who have type 2 diabetes. The length of recovery time may vary. What are the causes? Peripheral, autonomic, and focal neuropathies are caused by diabetes that is not well controlled with treatment. The cause of proximal neuropathy is not known, but it may be caused by inflammation related to uncontrolled blood glucose levels. What are the signs or symptoms? Peripheral neuropathy Peripheral  neuropathy develops slowly over time. When the nerves of the feet and legs no longer work, you may experience:  Burning, stabbing, or aching pain in the legs or feet.  Pain or cramping in the legs or feet.  Loss of feeling (numbness) and inability to feel pressure or pain in the feet. This can lead to: ? Thick calluses or sores on areas of constant pressure. ? Ulcers. ? Reduced ability to feel temperature changes.  Foot deformities.  Muscle weakness.  Loss of balance or coordination. Autonomic neuropathy The symptoms of autonomic neuropathy vary depending on which nerves are affected. Symptoms may include:  Problems with digestion, such as: ? Nausea or vomiting. ? Poor appetite. ? Bloating. ? Diarrhea or constipation. ? Trouble swallowing. ? Losing weight without trying to.  Problems with the heart, blood and lungs, such as: ? Dizziness, especially when standing up. ? Fainting. ? Shortness of breath. ? Irregular heartbeat.  Bladder problems, such as: ? Trouble starting or stopping urination. ? Leaking urine. ? Trouble emptying the bladder. ? Urinary tract infections (UTIs).  Problems with other body functions, such as: ? Sweat. You may sweat too much or too little. ? Temperature. You might get hot easily. Or, you might feel cold more than usual. ? Sexual function. Men may not be able to get or maintain an erection. Women may have vaginal dryness and difficulty with arousal. Focal neuropathy Symptoms affect only one area of the body. Common symptoms include:  Numbness.  Tingling.  Burning pain.  Prickling feeling.  Very sensitive skin.  Weakness.  Inability to move (paralysis).  Muscle twitching.  Muscles getting smaller (wasting).  Poor coordination.  Double or blurred vision. Proximal   neuropathy  Sudden, severe pain in the hip, thigh, or buttocks. Pain may spread from the back into the legs (sciatica).  Pain and numbness in the arms and  legs.  Tingling.  Loss of bladder control or bowel control.  Weakness and wasting of thigh muscles.  Difficulty getting up from a seated position.  Abdominal swelling.  Unexplained weight loss. How is this diagnosed? Diagnosis usually involves reviewing your medical history and any symptoms you have. Diagnosis varies depending on the type of neuropathy your health care provider suspects. Peripheral neuropathy Your health care provider will check areas that are affected by your nervous system (neurologic exam), such as your reflexes, how you move, and what you can feel. You may have other tests, such as:  Blood tests.  Removal and examination of fluid that surrounds the spinal cord (lumbar puncture).  CT scan.  MRI.  A test to check the nerves that control muscles (electromyogram, EMG).  Tests of how quickly messages pass through your nerves (nerve conduction velocity tests).  Removal of a small piece of nerve to be examined under a microscope (biopsy). Autonomic neuropathy You may have tests, such as:  Tests to measure your blood pressure and heart rate. This may include monitoring you while you are safely secured to an exam table that moves you from a lying position to an upright position (table tilt test).  Breathing tests to check your lungs.  Tests to check how food moves through the digestive system (gastric emptying tests).  Blood, sweat, or urine tests.  Ultrasound of your bladder.  Spinal fluid tests. Focal neuropathy This condition may be diagnosed with:  A neurologic exam.  CT scan.  MRI.  EMG.  Nerve conduction velocity tests. Proximal neuropathy There is no test to diagnose this type of neuropathy. You may have tests to rule out other possible causes of this type of neuropathy. Tests may include:  X-rays of your spine and lumbar region.  Lumbar puncture.  MRI. How is this treated? The goal of treatment is to keep nerve damage from getting  worse. The most important part of treatment is keeping your blood glucose level and your A1C level within your target range by following your diabetes management plan. Over time, maintaining lower blood glucose levels helps lessen symptoms. In some cases, you may need prescription pain medicine. Follow these instructions at home:  Lifestyle   Do not use any products that contain nicotine or tobacco, such as cigarettes and e-cigarettes. If you need help quitting, ask your health care provider.  Be physically active every day. Include strength training and balance exercises.  Follow a healthy meal plan.  Work with your health care provider to manage your blood pressure. General instructions  Follow your diabetes management plan as directed. ? Check your blood glucose levels as directed by your health care provider. ? Keep your blood glucose in your target range as directed by your health care provider. ? Have your A1C level checked at least two times a year, or as often as told by your health care provider.  Take over the counter and prescription medicines only as told by your health care provider. This includes insulin and diabetes medicine.  Do not drive or use heavy machinery while taking prescription pain medicines.  Check your skin and feet every day for cuts, bruises, redness, blisters, or sores.  Keep all follow up visits as told by your health care provider. This is important. Contact a health care provider if:  You have burning, stabbing, or aching pain in your legs or feet.  You are unable to feel pressure or pain in your feet.  You develop problems with digestion, such as: ? Nausea. ? Vomiting. ? Bloating. ? Constipation. ? Diarrhea. ? Abdominal pain.  You have difficulty with urination, such as inability: ? To control when you urinate (incontinence). ? To completely empty the bladder (retention).  You have palpitations.  You feel dizzy, weak, or faint when you  stand up. Get help right away if:  You cannot urinate.  You have sudden weakness or loss of coordination.  You have trouble speaking.  You have pain or pressure in your chest.  You have an irregular heart beat.  You have sudden inability to move a part of your body. Summary  Diabetic neuropathy refers to nerve damage that is caused by diabetes. It can affect nerves throughout the entire body, causing numbness and pain in the arms, legs, digestive tract, heart, and other body systems.  Keep your blood glucose level and your blood pressure in your target range, as directed by your health care provider. This can help prevent neuropathy from getting worse.  Check your skin and feet every day for cuts, bruises, redness, blisters, or sores.  Do not use any products that contain nicotine or tobacco, such as cigarettes and e-cigarettes. If you need help quitting, ask your health care provider. This information is not intended to replace advice given to you by your health care provider. Make sure you discuss any questions you have with your health care provider. Document Released: 07/31/2001 Document Revised: 07/04/2017 Document Reviewed: 06/26/2016 Elsevier Interactive Patient Education  2019 [employer]  Onychomycosis/Fungal Toenails  WHAT IS IT? An infection that lies within the keratin of your nail plate that is caused by a fungus.  WHY ME? Fungal infections affect all ages, sexes, races, and creeds.  There may be many factors that predispose you to a fungal infection such as age, coexisting medical conditions such as diabetes, or an autoimmune disease; stress, medications, fatigue, genetics, etc.  Bottom line: fungus thrives in a warm, moist environment and your shoes offer such a location.  IS IT CONTAGIOUS? Theoretically, yes.  You do not want to share shoes, nail clippers or files with someone who has fungal toenails.  Walking around barefoot in the same room or sleeping in the  same bed is unlikely to transfer the organism.  It is important to realize, however, that fungus can spread easily from one nail to the next on the same foot.  HOW DO WE TREAT THIS?  There are several ways to treat this condition.  Treatment may depend on many factors such as age, medications, pregnancy, liver and kidney conditions, etc.  It is best to ask your doctor which options are available to you.  1. No treatment.   Unlike many other medical concerns, you can live with this condition.  However for many people this can be a painful condition and may lead to ingrown toenails or a bacterial infection.  It is recommended that you keep the nails cut short to help reduce the amount of fungal nail. 2. Topical treatment.  These range from herbal remedies to prescription strength nail lacquers.  About 40-50% effective, topicals require twice daily application for approximately 9 to 12 months or until an entirely new nail has grown out.  The most effective topicals are medical grade medications available through physicians offices. 3. Oral antifungal medications.  With an 80-90%   cure rate, the most common oral medication requires 3 to 4 months of therapy and stays in your system for a year as the new nail grows out.  Oral antifungal medications do require blood work to make sure it is a safe drug for you.  A liver function panel will be performed prior to starting the medication and after the first month of treatment.  It is important to have the blood work performed to avoid any harmful side effects.  In general, this medication safe but blood work is required. 4. Laser Therapy.  This treatment is performed by applying a specialized laser to the affected nail plate.  This therapy is noninvasive, fast, and non-painful.  It is not covered by insurance and is therefore, out of pocket.  The results have been very good with a 80-95% cure rate.  [facility] is the only practice in the area to offer this  therapy. 5. Permanent Nail Avulsion.  Removing the entire nail so that a new nail will not grow back. 

## 2018-07-17 NOTE — Progress Notes (Signed)
Subjective: [name] presents today with painful, thick toenails 1-5 b/l that she cannot cut and which interfere with daily activities.  Pain is aggravated when wearing enclosed shoe gear.  She is accompanied by her daughter today.  She nor her daughter voiced any new pedal concerns on today's visit.  States her blood sugar was 135 mg/dL this morning.  Last hemoglobin A1c 6.6.  Misty Gravel, MD is her PCP.   Current Outpatient Medications:  .  aspirin EC 81 MG tablet, Take 81 mg by mouth daily., Disp: , Rfl:  .  Calcium 500-100 MG-UNIT CHEW, Chew by mouth., Disp: , Rfl:  .  carvedilol (COREG) 6.25 MG tablet, Take 1 tablet (6.25 mg total) by mouth 2 (two) times daily with a meal., Disp: 180 tablet, Rfl: 3 .  cholecalciferol (VITAMIN D) 1000 units tablet, Take 1,000 Units by mouth daily., Disp: , Rfl:  .  furosemide (LASIX) 20 MG tablet, Take 20 mg by mouth daily. , Disp: , Rfl:  .  HYDROcodone-acetaminophen (NORCO/VICODIN) 5-325 MG tablet, Take 1-2 tablets by mouth every 6 hours as needed for pain., Disp: 13 tablet, Rfl: 0 .  Ibuprofen-Diphenhydramine HCl (ADVIL PM) 200-25 MG CAPS, Take 1 tablet by mouth at bedtime. sleep, Disp: , Rfl:  .  levothyroxine (SYNTHROID, LEVOTHROID) 50 MCG tablet, Take 25 mcg by mouth daily before breakfast. , Disp: , Rfl:  .  linagliptin (TRADJENTA) 5 MG TABS tablet, Take 2.5 mg by mouth daily. , Disp: , Rfl:  .  losartan (COZAAR) 100 MG tablet, Take 100 mg by mouth daily., Disp: , Rfl: 2 .  metFORMIN (GLUCOPHAGE) 500 MG tablet, Take 500 mg by mouth 2 (two) times daily with a meal. , Disp: , Rfl:  .  mupirocin ointment (BACTROBAN) 2 %, APPLY OINTMENT TOPICALLY TO AFFECTED AREA THREE TIMES DAILY FOR 7 DAYS, Disp: , Rfl:  .  naproxen sodium (ALEVE) 220 MG tablet, Take 220 mg by mouth 2 (two) times daily as needed. Pain, Disp: , Rfl:  .  omeprazole (PRILOSEC) 20 MG capsule, Take 20 mg by mouth daily. , Disp: , Rfl:  .  oxybutynin (DITROPAN) 5 MG tablet, Take 5  mg by mouth daily. , Disp: , Rfl:  .  simvastatin (ZOCOR) 40 MG tablet, Take 40 mg by mouth every evening., Disp: , Rfl:   Allergies  Allergen Reactions  . Amoxapine And Related Hives  . Clozapine Hives    Objective:  Vascular Examination: Capillary refill time immediate x 10 digits  Dorsalis pedis and Posterior tibial pulses palpable b/l  Digital hair present x 10 digits  Skin temperature gradient WNL b/l  Dermatological Examination: Pedal skin is slightly thin and atrophic bilaterally.  Toenails 1-5 b/l discolored, thick, dystrophic with subungual debris and pain with palpation to nailbeds due to thickness of nails.  There is a small brownish macule noted on the plantar aspect of the left foot sulcus under the third digit.  Patient states she has had this mole there for years, but has not had is assessed by her dermatologist.  Musculoskeletal: Muscle strength 5/5 to all LE muscle groups  No gross bony deformities b/l.  No pain, crepitus or joint limitation noted with ROM.   Neurological: Sensation intact with 10 gram monofilament. Vibratory sensation intact.  Assessment: Painful onychomycosis toenails 1-5 b/l  Macule plantar aspect left foot  Plan: 1. Toenails 1-5 b/l were debrided in length and girth without iatrogenic bleeding. 2. [name] has an appointment with her dermatologist  on next week.  I have written on a prescription pad for the dermatologist to assess the macule on the bottom of her left foot.  Of the prescription was given to her daughter. 3. Patient to continue soft, supportive shoe gear 4. Patient to report any pedal injuries to medical professional immediately. 5. Follow up 4 months per request of patient. 6. Patient/POA to call should there be a concern in the interim.

## 2018-07-23 DIAGNOSIS — D2272 Melanocytic nevi of left lower limb, including hip: Secondary | ICD-10-CM | POA: Diagnosis not present

## 2018-07-23 DIAGNOSIS — L89309 Pressure ulcer of unspecified buttock, unspecified stage: Secondary | ICD-10-CM | POA: Diagnosis not present

## 2018-10-15 ENCOUNTER — Ambulatory Visit: Payer: Medicare HMO | Admitting: Podiatry

## 2018-10-18 DIAGNOSIS — N3020 Other chronic cystitis without hematuria: Secondary | ICD-10-CM | POA: Diagnosis not present

## 2018-10-18 DIAGNOSIS — N3941 Urge incontinence: Secondary | ICD-10-CM | POA: Diagnosis not present

## 2018-11-05 DIAGNOSIS — N3020 Other chronic cystitis without hematuria: Secondary | ICD-10-CM | POA: Diagnosis not present

## 2018-11-05 DIAGNOSIS — N3941 Urge incontinence: Secondary | ICD-10-CM | POA: Diagnosis not present

## 2018-11-06 DIAGNOSIS — I10 Essential (primary) hypertension: Secondary | ICD-10-CM | POA: Diagnosis not present

## 2018-11-06 DIAGNOSIS — E039 Hypothyroidism, unspecified: Secondary | ICD-10-CM | POA: Diagnosis not present

## 2018-11-06 DIAGNOSIS — E7800 Pure hypercholesterolemia, unspecified: Secondary | ICD-10-CM | POA: Diagnosis not present

## 2018-11-06 DIAGNOSIS — E119 Type 2 diabetes mellitus without complications: Secondary | ICD-10-CM | POA: Diagnosis not present

## 2018-11-13 DIAGNOSIS — E7800 Pure hypercholesterolemia, unspecified: Secondary | ICD-10-CM | POA: Diagnosis not present

## 2018-11-13 DIAGNOSIS — E039 Hypothyroidism, unspecified: Secondary | ICD-10-CM | POA: Diagnosis not present

## 2018-11-13 DIAGNOSIS — E119 Type 2 diabetes mellitus without complications: Secondary | ICD-10-CM | POA: Diagnosis not present

## 2018-11-13 DIAGNOSIS — N183 Chronic kidney disease, stage 3 (moderate): Secondary | ICD-10-CM | POA: Diagnosis not present

## 2018-11-13 DIAGNOSIS — I10 Essential (primary) hypertension: Secondary | ICD-10-CM | POA: Diagnosis not present

## 2018-11-13 DIAGNOSIS — I2510 Atherosclerotic heart disease of native coronary artery without angina pectoris: Secondary | ICD-10-CM | POA: Diagnosis not present

## 2018-11-20 DIAGNOSIS — R69 Illness, unspecified: Secondary | ICD-10-CM | POA: Diagnosis not present

## 2018-12-11 ENCOUNTER — Ambulatory Visit: Payer: Medicare HMO | Admitting: Podiatry

## 2018-12-11 ENCOUNTER — Encounter: Payer: Self-pay | Admitting: Podiatry

## 2018-12-11 ENCOUNTER — Other Ambulatory Visit: Payer: Self-pay

## 2018-12-11 VITALS — Temp 98.0°F

## 2018-12-11 DIAGNOSIS — B351 Tinea unguium: Secondary | ICD-10-CM

## 2018-12-11 DIAGNOSIS — B353 Tinea pedis: Secondary | ICD-10-CM | POA: Diagnosis not present

## 2018-12-11 DIAGNOSIS — M79675 Pain in left toe(s): Secondary | ICD-10-CM | POA: Diagnosis not present

## 2018-12-11 DIAGNOSIS — M79674 Pain in right toe(s): Secondary | ICD-10-CM

## 2018-12-11 MED ORDER — CICLOPIROX OLAMINE 0.77 % EX CREA: TOPICAL_CREAM | Freq: Two times a day (BID) | CUTANEOUS | 1 refills | Status: AC

## 2018-12-11 NOTE — Progress Notes (Signed)
Subjective:  [name] presents to clinic today with cc of  painful, thick, discolored, elongated toenails 1-5 b/l that become tender and cannot cut because of thickness.  Pain is aggravated when wearing enclosed shoe gear.  Daughter relates patient saw Dermatology in February and they were not alarmed about lesion on left foot.  [name], MD is her PCP.     Allergies  Allergen Reactions  . Amoxapine And Related Hives  . Clozapine Hives     Objective: Vitals:   12/11/18 0931  Temp: 98 F (36.7 C)    Physical Examination:  Vascular Examination: Capillary refill time immediate x 10 digits.  Palpable DP/PT pulses b/l.  Digital hair present b/l.  No edema noted b/l.  Skin temperature gradient WNL b/l.  Dermatological Examination: Pedal skin is thin and atrophic bilaterally.  No open wounds b/l.  No interdigital macerations noted b/l.  Elongated, thick, discolored brittle toenails with subungual debris and pain on dorsal palpation of nailbeds 1-5 b/l.  Macule noted plantar sulcus under 3rd digit. Measures 0.4 x 0.5 cm. Annular. Borders normal. Brownish in color.  No depth nor elevation.   Diffuse scaling noted peripherally and plantarly b/l feet with mild foot odor.  No interdigital macerations.  No blisters, no weeping. No signs of secondary bacterial infection noted.  Musculoskeletal Examination: Muscle strength 5/5 to all muscle groups b/l  No pain, crepitus or joint discomfort with active/passive ROM.  Neurological Examination: Sensation intact 5/5 b/l with 10 gram monofilament.  Vibratory sensation intact b/l.  Assessment: Mycotic nail infection with pain 1-5 b/l Tinea pedis b/l  Plan: 1. Toenails 1-5 b/l were debrided in length and girth without iatrogenic laceration. Prescription written for ciclopirox cream 0.77%.  Patient is to apply to both feet and between toes twice daily for 4 weeks. 2.  Continue soft, supportive shoe gear daily. 3.   Report any pedal injuries to medical professional. 4.  Follow up 3 months. 5.  Patient/POA to call should there be a question/concern in there interim.

## 2018-12-16 DIAGNOSIS — N3020 Other chronic cystitis without hematuria: Secondary | ICD-10-CM | POA: Diagnosis not present

## 2018-12-16 DIAGNOSIS — N3941 Urge incontinence: Secondary | ICD-10-CM | POA: Diagnosis not present

## 2019-03-07 DIAGNOSIS — R600 Localized edema: Secondary | ICD-10-CM | POA: Diagnosis not present

## 2019-03-07 DIAGNOSIS — Z23 Encounter for immunization: Secondary | ICD-10-CM | POA: Diagnosis not present

## 2019-03-12 ENCOUNTER — Ambulatory Visit (INDEPENDENT_AMBULATORY_CARE_PROVIDER_SITE_OTHER): Payer: Medicare HMO | Admitting: Podiatry

## 2019-03-12 ENCOUNTER — Other Ambulatory Visit: Payer: Self-pay

## 2019-03-12 ENCOUNTER — Encounter: Payer: Self-pay | Admitting: Podiatry

## 2019-03-12 DIAGNOSIS — B351 Tinea unguium: Secondary | ICD-10-CM | POA: Diagnosis not present

## 2019-03-12 DIAGNOSIS — E119 Type 2 diabetes mellitus without complications: Secondary | ICD-10-CM | POA: Diagnosis not present

## 2019-03-12 DIAGNOSIS — M79675 Pain in left toe(s): Secondary | ICD-10-CM

## 2019-03-12 DIAGNOSIS — M79674 Pain in right toe(s): Secondary | ICD-10-CM | POA: Diagnosis not present

## 2019-03-12 NOTE — Patient Instructions (Signed)

## 2019-03-16 NOTE — Progress Notes (Signed)
Subjective:  [name] presents to clinic today with cc of  painful, thick, discolored, elongated toenails 1-5 b/l that become tender and cannot cut because of thickness. Pain is aggravated when wearing enclosed shoe gear.  She voices no new pedal problems on today's visit.   Current Outpatient Medications on File Prior to Visit  Medication Sig Dispense Refill  . amoxicillin-clavulanate (AUGMENTIN) 500-125 MG tablet Take 1 tablet by mouth 2 (two) times daily.    Marland Kitchen aspirin EC 81 MG tablet Take 81 mg by mouth daily.    . Calcium 500-100 MG-UNIT CHEW Chew by mouth.    . carvedilol (COREG) 6.25 MG tablet Take 1 tablet (6.25 mg total) by mouth 2 (two) times daily with a meal. 180 tablet 3  . cephALEXin (KEFLEX) 500 MG capsule Take 500 mg by mouth 2 (two) times daily.    . cholecalciferol (VITAMIN D) 1000 units tablet Take 1,000 Units by mouth daily.    . furosemide (LASIX) 20 MG tablet Take 20 mg by mouth daily.     Marland Kitchen HYDROcodone-acetaminophen (NORCO/VICODIN) 5-325 MG tablet Take 1-2 tablets by mouth every 6 hours as needed for pain. 13 tablet 0  . Ibuprofen-Diphenhydramine HCl (ADVIL PM) 200-25 MG CAPS Take 1 tablet by mouth at bedtime. sleep    . levothyroxine (SYNTHROID, LEVOTHROID) 50 MCG tablet Take 25 mcg by mouth daily before breakfast.     . linagliptin (TRADJENTA) 5 MG TABS tablet Take 2.5 mg by mouth daily.     Marland Kitchen losartan (COZAAR) 100 MG tablet Take 100 mg by mouth daily.  2  . metFORMIN (GLUCOPHAGE) 500 MG tablet Take 500 mg by mouth 2 (two) times daily with a meal.     . mupirocin ointment (BACTROBAN) 2 % APPLY OINTMENT TOPICALLY TO AFFECTED AREA THREE TIMES DAILY FOR 7 DAYS    . naproxen sodium (ALEVE) 220 MG tablet Take 220 mg by mouth 2 (two) times daily as needed. Pain    . omeprazole (PRILOSEC) 20 MG capsule Take 20 mg by mouth daily.     Marland Kitchen oxybutynin (DITROPAN) 5 MG tablet Take 5 mg by mouth daily.     . pneumococcal 23 valent vaccine (PNEUMOVAX 23) 25 MCG/0.5ML injection  Pneumovax-23 25 mcg/0.5 mL injection syringe  INJECT 0.5ML INTRAMUSCULARLY ONCE    . simvastatin (ZOCOR) 40 MG tablet Take 40 mg by mouth every evening.    . solifenacin (VESICARE) 5 MG tablet Take 5 mg by mouth daily.    Marland Kitchen tolterodine (DETROL LA) 4 MG 24 hr capsule Take 4 mg by mouth daily.    . [DISCONTINUED] pantoprazole (PROTONIX) 40 MG tablet Take 1 tablet (40 mg total) by mouth daily at 12 noon.    . [DISCONTINUED] ramipril (ALTACE) 5 MG capsule Take 1 capsule (5 mg total) by mouth daily. 30 capsule 1   No current facility-administered medications on file prior to visit.      Allergies  Allergen Reactions  . Amoxapine And Related Hives  . Clozapine Hives   Objective:  Physical Examination:  Vascular Examination: Capillary refill time immediate x 10 digits.  Palpable DP/PT pulses b/l.  Digital hair present b/l.  No edema noted b/l.  Skin temperature gradient WNL b/l.  Dermatological Examination: Pedal skin thin, shiny and atrophic b/l.   No open wounds b/l.  No interdigital macerations noted b/l.  Elongated, thick, discolored brittle toenails with subungual debris and pain on dorsal palpation of nailbeds 1-5 b/l.  Macule plantar sulcus 3rd digit, unchanged  in size/characteristics. 0.4 x 0.5 cm.  Annular with normal borders. Brownish in color. No depth/elevation. No bleeding. Has been assessed by Dermatology and did not require biopsy/further work up.  Musculoskeletal Examination: Muscle strength 5/5 to all muscle groups b/l.  No pain, crepitus or joint discomfort with active/passive ROM.  Neurological Examination: Sensation intact 5/5 b/l with 10 gram monofilament.  Vibratory sensation intact b/l.  Proprioceptive sensation intact b/l.  Assessment: Mycotic nail infection with pain 1-5 b/l NIDDM   Plan: 1. Toenails 1-5 b/l were debrided in length and girth without iatrogenic laceration. 2.  Continue soft, supportive shoe gear daily. 3.  Report any pedal  injuries to medical professional. 4.  Follow up 3 months. 5.  Patient/POA to call should there be a question/concern in there interim.

## 2019-03-20 DIAGNOSIS — H6123 Impacted cerumen, bilateral: Secondary | ICD-10-CM | POA: Diagnosis not present

## 2019-03-24 DIAGNOSIS — R69 Illness, unspecified: Secondary | ICD-10-CM | POA: Diagnosis not present

## 2019-03-27 DIAGNOSIS — E7800 Pure hypercholesterolemia, unspecified: Secondary | ICD-10-CM | POA: Diagnosis not present

## 2019-03-27 DIAGNOSIS — E039 Hypothyroidism, unspecified: Secondary | ICD-10-CM | POA: Diagnosis not present

## 2019-03-27 DIAGNOSIS — I10 Essential (primary) hypertension: Secondary | ICD-10-CM | POA: Diagnosis not present

## 2019-04-08 DIAGNOSIS — E119 Type 2 diabetes mellitus without complications: Secondary | ICD-10-CM | POA: Diagnosis not present

## 2019-04-08 DIAGNOSIS — I10 Essential (primary) hypertension: Secondary | ICD-10-CM | POA: Diagnosis not present

## 2019-04-08 DIAGNOSIS — G4700 Insomnia, unspecified: Secondary | ICD-10-CM | POA: Diagnosis not present

## 2019-04-08 DIAGNOSIS — I2510 Atherosclerotic heart disease of native coronary artery without angina pectoris: Secondary | ICD-10-CM | POA: Diagnosis not present

## 2019-04-08 DIAGNOSIS — Z0000 Encounter for general adult medical examination without abnormal findings: Secondary | ICD-10-CM | POA: Diagnosis not present

## 2019-04-08 DIAGNOSIS — N183 Chronic kidney disease, stage 3 unspecified: Secondary | ICD-10-CM | POA: Diagnosis not present

## 2019-04-08 DIAGNOSIS — R739 Hyperglycemia, unspecified: Secondary | ICD-10-CM | POA: Diagnosis not present

## 2019-04-08 DIAGNOSIS — E7800 Pure hypercholesterolemia, unspecified: Secondary | ICD-10-CM | POA: Diagnosis not present

## 2019-04-08 DIAGNOSIS — R2681 Unsteadiness on feet: Secondary | ICD-10-CM | POA: Diagnosis not present

## 2019-04-08 DIAGNOSIS — M1990 Unspecified osteoarthritis, unspecified site: Secondary | ICD-10-CM | POA: Diagnosis not present

## 2019-04-09 NOTE — Progress Notes (Signed)
Patient ID: [name], female   DOB: September 24, 1924, 85 y.o.   MRN: [id number]      85 y.o. anterior MI 07/07/11 with delayed presentation and VSD. CABG with SVG to D1 and LAD and VSD patch. EF 30-35% mild AR/MR echo 2015  Subsequent thymoma requiring surgery.   Back surgery with Dr Ronnald Ramp uneventful  08/20/15 Left L4-5 hemilaminectomy, medial facetectomy, and foraminotomy followed by microdiscectomy  No clinical CHF or chest pain had a nice picture of herself hugging one of my brothers mortgage signs.  Still living at home independently Daughter looks in on her daily No longer driving   No cardiac complaints Daughter looks in on her every day and only lives 15 minutes away   ROS: Denies fever, malais, weight loss, blurry vision, decreased visual acuity, cough, sputum, SOB, hemoptysis, pleuritic pain, palpitaitons, heartburn, abdominal pain, melena, lower extremity edema, claudication, or rash.  All other systems reviewed and negative  General: BP (!) 110/50   Pulse 87   Ht 5\' 1"  (1.549 m)   Wt 154 lb (69.9 kg)   BMI 29.10 kg/m  Affect appropriate Healthy:  appears stated age 60: normal Neck supple with no adenopathy JVP normal no bruits no thyromegaly Lungs clear with no wheezing and good diaphragmatic motion Heart:  S1/S2 SEM  murmur, no rub, gallop or click PMI enlarged  Abdomen: benighn, BS positve, no tenderness, no AAA no bruit.  No HSM or HJR Distal pulses intact with no bruits No edema Neuro non-focal Skin warm and dry No muscular weakness Post laminectomy lumbar     Current Outpatient Medications  Medication Sig Dispense Refill  . amoxicillin-clavulanate (AUGMENTIN) 500-125 MG tablet Take 1 tablet by mouth 2 (two) times daily.    Marland Kitchen aspirin EC 81 MG tablet Take 81 mg by mouth daily.    . Calcium 500-100 MG-UNIT CHEW Chew by mouth.    . carvedilol (COREG) 6.25 MG tablet Take 1 tablet (6.25 mg total) by mouth 2 (two) times daily with a meal. 180 tablet 3  .  cephALEXin (KEFLEX) 500 MG capsule Take 500 mg by mouth 2 (two) times daily.    . cholecalciferol (VITAMIN D) 1000 units tablet Take 1,000 Units by mouth daily.    . furosemide (LASIX) 20 MG tablet Take 20 mg by mouth daily.     Marland Kitchen HYDROcodone-acetaminophen (NORCO/VICODIN) 5-325 MG tablet Take 1-2 tablets by mouth every 6 hours as needed for pain. 13 tablet 0  . Ibuprofen-Diphenhydramine HCl (ADVIL PM) 200-25 MG CAPS Take 1 tablet by mouth at bedtime. sleep    . levothyroxine (SYNTHROID, LEVOTHROID) 50 MCG tablet Take 25 mcg by mouth daily before breakfast.     . linagliptin (TRADJENTA) 5 MG TABS tablet Take 2.5 mg by mouth daily.     Marland Kitchen losartan (COZAAR) 100 MG tablet Take 100 mg by mouth daily.  2  . metFORMIN (GLUCOPHAGE) 500 MG tablet Take 500 mg by mouth 2 (two) times daily with a meal.     . mupirocin ointment (BACTROBAN) 2 % APPLY OINTMENT TOPICALLY TO AFFECTED AREA THREE TIMES DAILY FOR 7 DAYS    . naproxen sodium (ALEVE) 220 MG tablet Take 220 mg by mouth 2 (two) times daily as needed. Pain    . omeprazole (PRILOSEC) 20 MG capsule Take 20 mg by mouth daily.     Marland Kitchen oxybutynin (DITROPAN) 5 MG tablet Take 5 mg by mouth daily.     . pneumococcal 23 valent vaccine (PNEUMOVAX 23) 25 MCG/0.5ML  injection Pneumovax-23 25 mcg/0.5 mL injection syringe  INJECT 0.5ML INTRAMUSCULARLY ONCE    . simvastatin (ZOCOR) 40 MG tablet Take 40 mg by mouth every evening.    . solifenacin (VESICARE) 5 MG tablet Take 5 mg by mouth daily.    Marland Kitchen tolterodine (DETROL LA) 4 MG 24 hr capsule Take 4 mg by mouth daily.     No current facility-administered medications for this visit.     Allergies  Amoxapine and related and Clozapine  Electrocardiogram:  04/11/19 SR rate 87 LAD low voltage  Assessment and Plan CAD:  Stable with no angina and good activity level.  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

## 2019-04-14 ENCOUNTER — Encounter (INDEPENDENT_AMBULATORY_CARE_PROVIDER_SITE_OTHER): Payer: Self-pay

## 2019-04-14 ENCOUNTER — Ambulatory Visit (INDEPENDENT_AMBULATORY_CARE_PROVIDER_SITE_OTHER): Payer: Medicare HMO | Admitting: Cardiovascular Disease

## 2019-04-14 ENCOUNTER — Encounter: Payer: Self-pay | Admitting: Cardiovascular Disease

## 2019-04-14 ENCOUNTER — Other Ambulatory Visit: Payer: Self-pay

## 2019-04-14 VITALS — BP 110/50 | HR 87 | Ht 61.0 in | Wt 154.0 lb

## 2019-04-14 DIAGNOSIS — Z8774 Personal history of (corrected) congenital malformations of heart and circulatory system: Secondary | ICD-10-CM

## 2019-04-14 NOTE — Patient Instructions (Signed)
Medication Instructions:   *If you need a refill on your cardiac medications before your next appointment, please call your pharmacy*  Lab Work:  If you have labs (blood work) drawn today and your tests are completely normal, you will receive your results only by: Marland Kitchen MyChart Message (if you have MyChart) OR . A paper copy in the mail If you have any lab test that is abnormal or we need to change your treatment, we will call you to review the results.  Testing/Procedures: None ordered today.  Follow-Up: At [facility], you and your health needs are our priority.  As part of our continuing mission to provide you with exceptional heart care, we have created designated Provider Care Teams.  These Care Teams include your primary Cardiologist (physician) and Advanced Practice Providers (APPs -  Physician Assistants and Nurse Practitioners) who all work together to provide you with the care you need, when you need it.  Your next appointment:   6 months  The format for your next appointment:   In Person  Provider:   You may see Dr. Johnsie Cancel or one of the following Advanced Practice Providers on your designated Care Team:    Truitt Merle, NP  Cecilie Kicks, NP  Kathyrn Drown, NP

## 2019-04-22 ENCOUNTER — Emergency Department (HOSPITAL_COMMUNITY): Payer: Medicare HMO

## 2019-04-22 ENCOUNTER — Observation Stay (HOSPITAL_COMMUNITY)
Admission: EM | Admit: 2019-04-22 | Discharge: 2019-04-24 | Disposition: A | Discharge status: Home / Self Care | Payer: Medicare HMO | Attending: Internal Medicine | Admitting: Internal Medicine

## 2019-04-22 ENCOUNTER — Encounter (HOSPITAL_COMMUNITY): Payer: Self-pay

## 2019-04-22 ENCOUNTER — Other Ambulatory Visit: Payer: Self-pay

## 2019-04-22 DIAGNOSIS — E872 Acidosis: Secondary | ICD-10-CM | POA: Diagnosis not present

## 2019-04-22 DIAGNOSIS — M5442 Lumbago with sciatica, left side: Secondary | ICD-10-CM

## 2019-04-22 DIAGNOSIS — M47816 Spondylosis without myelopathy or radiculopathy, lumbar region: Secondary | ICD-10-CM | POA: Diagnosis not present

## 2019-04-22 DIAGNOSIS — Z7984 Long term (current) use of oral hypoglycemic drugs: Secondary | ICD-10-CM | POA: Insufficient documentation

## 2019-04-22 DIAGNOSIS — S76812A Strain of other specified muscles, fascia and tendons at thigh level, left thigh, initial encounter: Principal | ICD-10-CM | POA: Insufficient documentation

## 2019-04-22 DIAGNOSIS — Z923 Personal history of irradiation: Secondary | ICD-10-CM | POA: Insufficient documentation

## 2019-04-22 DIAGNOSIS — M25559 Pain in unspecified hip: Secondary | ICD-10-CM | POA: Diagnosis not present

## 2019-04-22 DIAGNOSIS — D631 Anemia in chronic kidney disease: Secondary | ICD-10-CM | POA: Diagnosis not present

## 2019-04-22 DIAGNOSIS — E1122 Type 2 diabetes mellitus with diabetic chronic kidney disease: Secondary | ICD-10-CM | POA: Insufficient documentation

## 2019-04-22 DIAGNOSIS — E785 Hyperlipidemia, unspecified: Secondary | ICD-10-CM | POA: Insufficient documentation

## 2019-04-22 DIAGNOSIS — I2510 Atherosclerotic heart disease of native coronary artery without angina pectoris: Secondary | ICD-10-CM | POA: Diagnosis not present

## 2019-04-22 DIAGNOSIS — I130 Hypertensive heart and chronic kidney disease with heart failure and stage 1 through stage 4 chronic kidney disease, or unspecified chronic kidney disease: Secondary | ICD-10-CM | POA: Insufficient documentation

## 2019-04-22 DIAGNOSIS — K5900 Constipation, unspecified: Secondary | ICD-10-CM | POA: Insufficient documentation

## 2019-04-22 DIAGNOSIS — Z7982 Long term (current) use of aspirin: Secondary | ICD-10-CM | POA: Insufficient documentation

## 2019-04-22 DIAGNOSIS — Z951 Presence of aortocoronary bypass graft: Secondary | ICD-10-CM | POA: Diagnosis not present

## 2019-04-22 DIAGNOSIS — I252 Old myocardial infarction: Secondary | ICD-10-CM | POA: Diagnosis not present

## 2019-04-22 DIAGNOSIS — S76312A Strain of muscle, fascia and tendon of the posterior muscle group at thigh level, left thigh, initial encounter: Secondary | ICD-10-CM | POA: Diagnosis not present

## 2019-04-22 DIAGNOSIS — M48061 Spinal stenosis, lumbar region without neurogenic claudication: Secondary | ICD-10-CM | POA: Insufficient documentation

## 2019-04-22 DIAGNOSIS — Z96652 Presence of left artificial knee joint: Secondary | ICD-10-CM | POA: Insufficient documentation

## 2019-04-22 DIAGNOSIS — Z8774 Personal history of (corrected) congenital malformations of heart and circulatory system: Secondary | ICD-10-CM | POA: Diagnosis not present

## 2019-04-22 DIAGNOSIS — Z66 Do not resuscitate: Secondary | ICD-10-CM | POA: Insufficient documentation

## 2019-04-22 DIAGNOSIS — R000 Tachycardia, unspecified: Secondary | ICD-10-CM | POA: Diagnosis not present

## 2019-04-22 DIAGNOSIS — Z03818 Encounter for observation for suspected exposure to other biological agents ruled out: Secondary | ICD-10-CM | POA: Diagnosis not present

## 2019-04-22 DIAGNOSIS — W07XXXA Fall from chair, initial encounter: Secondary | ICD-10-CM | POA: Insufficient documentation

## 2019-04-22 DIAGNOSIS — S3992XA Unspecified injury of lower back, initial encounter: Secondary | ICD-10-CM | POA: Diagnosis not present

## 2019-04-22 DIAGNOSIS — I5022 Chronic systolic (congestive) heart failure: Secondary | ICD-10-CM | POA: Insufficient documentation

## 2019-04-22 DIAGNOSIS — Z79899 Other long term (current) drug therapy: Secondary | ICD-10-CM | POA: Diagnosis not present

## 2019-04-22 DIAGNOSIS — Z79890 Hormone replacement therapy (postmenopausal): Secondary | ICD-10-CM | POA: Insufficient documentation

## 2019-04-22 DIAGNOSIS — N183 Chronic kidney disease, stage 3 unspecified: Secondary | ICD-10-CM | POA: Diagnosis not present

## 2019-04-22 DIAGNOSIS — E871 Hypo-osmolality and hyponatremia: Secondary | ICD-10-CM | POA: Insufficient documentation

## 2019-04-22 DIAGNOSIS — E039 Hypothyroidism, unspecified: Secondary | ICD-10-CM | POA: Insufficient documentation

## 2019-04-22 DIAGNOSIS — Z20828 Contact with and (suspected) exposure to other viral communicable diseases: Secondary | ICD-10-CM | POA: Insufficient documentation

## 2019-04-22 DIAGNOSIS — M79605 Pain in left leg: Secondary | ICD-10-CM | POA: Diagnosis not present

## 2019-04-22 DIAGNOSIS — R52 Pain, unspecified: Secondary | ICD-10-CM | POA: Diagnosis not present

## 2019-04-22 DIAGNOSIS — D649 Anemia, unspecified: Secondary | ICD-10-CM | POA: Diagnosis not present

## 2019-04-22 DIAGNOSIS — M25552 Pain in left hip: Secondary | ICD-10-CM | POA: Diagnosis not present

## 2019-04-22 DIAGNOSIS — S79912A Unspecified injury of left hip, initial encounter: Secondary | ICD-10-CM | POA: Diagnosis not present

## 2019-04-22 DIAGNOSIS — R0902 Hypoxemia: Secondary | ICD-10-CM | POA: Diagnosis not present

## 2019-04-22 DIAGNOSIS — I10 Essential (primary) hypertension: Secondary | ICD-10-CM | POA: Diagnosis not present

## 2019-04-22 LAB — BASIC METABOLIC PANEL
Anion gap: 8 (ref 5–15)
BUN: 34 mg/dL — ABNORMAL HIGH (ref 8–23)
CO2: 22 mmol/L (ref 22–32)
Calcium: 8.9 mg/dL (ref 8.9–10.3)
Chloride: 109 mmol/L (ref 98–111)
Creatinine, Ser: 1.41 mg/dL — ABNORMAL HIGH (ref 0.44–1.00)
GFR calc Af Amer: 37 mL/min — ABNORMAL LOW (ref >60)
GFR calc non Af Amer: 32 mL/min — ABNORMAL LOW (ref >60)
Glucose, Bld: 162 mg/dL — ABNORMAL HIGH (ref 70–99)
Potassium: 4.5 mmol/L (ref 3.5–5.1)
Sodium: 139 mmol/L (ref 135–145)

## 2019-04-22 LAB — CBC
HCT: 35.4 % — ABNORMAL LOW (ref 36.0–46.0)
Hemoglobin: 11 g/dL — ABNORMAL LOW (ref 12.0–15.0)
MCH: 27.8 pg (ref 26.0–34.0)
MCHC: 31.1 g/dL (ref 30.0–36.0)
MCV: 89.6 fL (ref 80.0–100.0)
Platelets: 259 10*3/uL (ref 150–400)
RBC: 3.95 MIL/uL (ref 3.87–5.11)
RDW: 14.4 % (ref 11.5–15.5)
WBC: 11.1 10*3/uL — ABNORMAL HIGH (ref 4.0–10.5)
nRBC: 0 % (ref 0.0–0.2)

## 2019-04-22 LAB — GLUCOSE, CAPILLARY: Glucose-Capillary: 90 mg/dL (ref 70–99)

## 2019-04-22 LAB — HEMOGLOBIN A1C
Hgb A1c MFr Bld: 6.4 % — ABNORMAL HIGH (ref 4.8–5.6)
Mean Plasma Glucose: 136.98 mg/dL

## 2019-04-22 MED ORDER — ONDANSETRON HCL 4 MG/2ML IJ SOLN: 4.0000 mg | Freq: Four times a day (QID) | INTRAMUSCULAR | Status: DC | PRN

## 2019-04-22 MED ORDER — CALCIUM CITRATE-VITAMIN D 500-500 MG-UNIT PO CHEW: 1.0000 | CHEWABLE_TABLET | Freq: Every day | ORAL | Status: DC

## 2019-04-22 MED ORDER — OXYCODONE HCL 5 MG PO TABS
5.0000 mg | ORAL_TABLET | ORAL | Status: DC | PRN
  Administered 2019-04-24: 5 mg via ORAL
  Filled 2019-04-22: qty 1

## 2019-04-22 MED ORDER — MUPIROCIN 2 % EX OINT
TOPICAL_OINTMENT | Freq: Three times a day (TID) | CUTANEOUS | Status: DC
  Administered 2019-04-22 – 2019-04-23 (×2): 1 via TOPICAL
  Administered 2019-04-23 – 2019-04-24 (×3): via TOPICAL
  Filled 2019-04-22 (×2): qty 22

## 2019-04-22 MED ORDER — FUROSEMIDE 20 MG PO TABS
20.0000 mg | ORAL_TABLET | Freq: Every day | ORAL | Status: DC
  Administered 2019-04-23 – 2019-04-24 (×2): 20 mg via ORAL
  Filled 2019-04-22 (×2): qty 1

## 2019-04-22 MED ORDER — LORAZEPAM 2 MG/ML IJ SOLN
0.5000 mg | Freq: Once | INTRAMUSCULAR | Status: AC | PRN
  Administered 2019-04-22: 0.5 mg via INTRAVENOUS
  Filled 2019-04-22: qty 1

## 2019-04-22 MED ORDER — LOSARTAN POTASSIUM 50 MG PO TABS
100.0000 mg | ORAL_TABLET | Freq: Every day | ORAL | Status: DC
  Administered 2019-04-23 – 2019-04-24 (×2): 100 mg via ORAL
  Filled 2019-04-22 (×2): qty 2

## 2019-04-22 MED ORDER — ENOXAPARIN SODIUM 30 MG/0.3ML ~~LOC~~ SOLN
30.0000 mg | Freq: Every day | SUBCUTANEOUS | Status: DC
  Administered 2019-04-22 – 2019-04-23 (×2): 30 mg via SUBCUTANEOUS
  Filled 2019-04-22 (×2): qty 0.3

## 2019-04-22 MED ORDER — INSULIN ASPART 100 UNIT/ML ~~LOC~~ SOLN
0.0000 [IU] | Freq: Three times a day (TID) | SUBCUTANEOUS | Status: DC
  Administered 2019-04-23: 2 [IU] via SUBCUTANEOUS

## 2019-04-22 MED ORDER — LEVOTHYROXINE SODIUM 25 MCG PO TABS
25.0000 ug | ORAL_TABLET | Freq: Every day | ORAL | Status: DC
  Administered 2019-04-23 – 2019-04-24 (×2): 25 ug via ORAL
  Filled 2019-04-22 (×2): qty 1

## 2019-04-22 MED ORDER — ONDANSETRON HCL 4 MG PO TABS: 4.0000 mg | ORAL_TABLET | Freq: Four times a day (QID) | ORAL | Status: DC | PRN

## 2019-04-22 MED ORDER — DOCUSATE SODIUM 100 MG PO CAPS
100.0000 mg | ORAL_CAPSULE | Freq: Two times a day (BID) | ORAL | Status: DC
  Administered 2019-04-22 – 2019-04-23 (×2): 100 mg via ORAL
  Filled 2019-04-22 (×2): qty 1

## 2019-04-22 MED ORDER — CALCIUM CARBONATE-VITAMIN D 500-200 MG-UNIT PO TABS
1.0000 | ORAL_TABLET | Freq: Every day | ORAL | Status: DC
  Administered 2019-04-23 – 2019-04-24 (×2): 1 via ORAL
  Filled 2019-04-22 (×2): qty 1

## 2019-04-22 MED ORDER — VITAMIN D 25 MCG (1000 UNIT) PO TABS
1000.0000 [IU] | ORAL_TABLET | Freq: Every day | ORAL | Status: DC
  Administered 2019-04-22 – 2019-04-24 (×3): 1000 [IU] via ORAL
  Filled 2019-04-22 (×3): qty 1

## 2019-04-22 MED ORDER — ACETAMINOPHEN 325 MG PO TABS
650.0000 mg | ORAL_TABLET | Freq: Four times a day (QID) | ORAL | Status: DC | PRN
  Administered 2019-04-23 – 2019-04-24 (×2): 650 mg via ORAL
  Filled 2019-04-22 (×2): qty 2

## 2019-04-22 MED ORDER — ACETAMINOPHEN 650 MG RE SUPP: 650.0000 mg | Freq: Four times a day (QID) | RECTAL | Status: DC | PRN

## 2019-04-22 MED ORDER — CARVEDILOL 6.25 MG PO TABS
6.2500 mg | ORAL_TABLET | Freq: Two times a day (BID) | ORAL | Status: DC
  Administered 2019-04-23 – 2019-04-24 (×3): 6.25 mg via ORAL
  Filled 2019-04-22 (×3): qty 1

## 2019-04-22 MED ORDER — PANTOPRAZOLE SODIUM 40 MG PO TBEC
40.0000 mg | DELAYED_RELEASE_TABLET | Freq: Every day | ORAL | Status: DC
  Administered 2019-04-22 – 2019-04-24 (×3): 40 mg via ORAL
  Filled 2019-04-22 (×3): qty 1

## 2019-04-22 MED ORDER — DARIFENACIN HYDROBROMIDE ER 7.5 MG PO TB24
7.5000 mg | ORAL_TABLET | Freq: Every day | ORAL | Status: DC
  Administered 2019-04-23 – 2019-04-24 (×2): 7.5 mg via ORAL
  Filled 2019-04-22 (×2): qty 1

## 2019-04-22 MED ORDER — MORPHINE SULFATE (PF) 4 MG/ML IV SOLN
4.0000 mg | Freq: Once | INTRAVENOUS | Status: AC
  Administered 2019-04-22: 4 mg via INTRAVENOUS
  Filled 2019-04-22: qty 1

## 2019-04-22 MED ORDER — MORPHINE SULFATE (PF) 4 MG/ML IV SOLN
4.0000 mg | Freq: Once | INTRAVENOUS | Status: AC
  Administered 2019-04-22: 15:00:00 4 mg via INTRAVENOUS
  Filled 2019-04-22: qty 1

## 2019-04-22 MED ORDER — ASPIRIN EC 81 MG PO TBEC
81.0000 mg | DELAYED_RELEASE_TABLET | Freq: Every day | ORAL | Status: DC
  Administered 2019-04-22 – 2019-04-24 (×3): 81 mg via ORAL
  Filled 2019-04-22 (×3): qty 1

## 2019-04-22 MED ORDER — SIMVASTATIN 40 MG PO TABS
40.0000 mg | ORAL_TABLET | Freq: Every evening | ORAL | Status: DC
  Administered 2019-04-22 – 2019-04-23 (×2): 40 mg via ORAL
  Filled 2019-04-22 (×2): qty 1

## 2019-04-22 MED ORDER — LINAGLIPTIN 5 MG PO TABS
2.5000 mg | ORAL_TABLET | Freq: Every day | ORAL | Status: DC
  Administered 2019-04-23 – 2019-04-24 (×2): 2.5 mg via ORAL
  Filled 2019-04-22 (×2): qty 1

## 2019-04-22 MED ORDER — HYDROCODONE-ACETAMINOPHEN 5-325 MG PO TABS
1.0000 | ORAL_TABLET | Freq: Four times a day (QID) | ORAL | Status: DC | PRN
  Administered 2019-04-23 – 2019-04-24 (×2): 1 via ORAL
  Filled 2019-04-22 (×3): qty 1

## 2019-04-22 MED ORDER — NAPROXEN 250 MG PO TABS
250.0000 mg | ORAL_TABLET | Freq: Two times a day (BID) | ORAL | Status: DC | PRN
  Filled 2019-04-22: qty 1

## 2019-04-22 NOTE — H&P (Signed)
History and Physical    [name] [id number] DOB: 1925-05-11 DOA: 04/22/2019  PCP: Scheryl Marten, PA   Patient coming from: Home  I have personally briefly reviewed patient's old medical records in [place]  Chief Complaint: Left hip pain  HPI: SIYAH Nichols is a 85 y.o. female with medical history significant of hypertension, hypothyroidism, diabetes, hyperlipidemia, coronary disease/status post CABG, VSD status post patch repair, lumbar radiculopathy s/p surgery 08/2015, and overactive bladder who presents with left hip pain.  Patient accompanied by her daughter who assists with history.  Patient was in usual state of health through this past Friday.  She lives independently and has routine visits from her daughter.  Friday night patient fell asleep in her recliner not in her usual chair.  She states around 1 AM, early Saturday morning she slowly slid down on her recliner and fell to the floor.  She states it was not very traumatic but she landed on her bottom and she was unable to get up.  She did not hit her head.  Recalls the entire event.  She denies any loss of consciousness.  She called EMS and they were able to lift her back up and she was able to stand and walk around with her walker.  She states after this she eventually did fall asleep in her regular chair.  Her daughter visited her the next day and they went shopping on both Saturday and Sunday.  The patient was able to ambulate without notable discomfort.  The daughter reports that she did not know about the event on Friday night until this morning.  So if there were new changes she was not closely pay attention.  She did state she noticed that she had to assist her mom getting in and out of the car and had to lift her left leg up in and out.  The patient does states she did not really notice much of a change over the weekend nor on Monday.  However Monday night after going to bed in her chair she reports that  she was having severe left hip pain that would make her cry out every 15 minutes.  She reports the pain was so severe she did not sleep throughout the night.  Her daughter reports she had gone to her dentist appointment and went to check in on her mom soon after.  And while she was there she notes that her mom was in much more pain and cannot ambulate except for once to the bathroom and back.  She is followed closely behind her and noticed that she was dragging her left foot.  She states they were supposed to see her PCP this afternoon but when she was seated in her chair she almost slid out again so they decided to come in by EMS.  The patient states the pain is mostly located in the left hip.  She states it hurts when she presses on it.  She does not really describe a shooting pain down her leg.  She states though if she were to put pressure on her foot she feels pain in her hip.  Besides sliding out of the chair she denies any awkward falls or trauma.  Otherwise she has been in her usual state of health.  She has been taking her medications daily without issue.  She denies any fevers, chills, shortness of breath, cough.  She denies any nausea, vomiting.  She has a good appetite.  She  does report that she does have poor sleep mostly because she has to wake up in the night to urinate.  She takes an afternoon nap.  She is a non-smoker, no alcohol use, no drug use.  Her daughter states she lives in a house filled with smoke however so she does have an occasional cough but nothing unusual.  Review of Systems: As per HPI otherwise 10 point review of systems negative.    Past Medical History:  Diagnosis Date  . Acute MI, anterior wall ([place])    a. 07/07/2011  . CAD (coronary artery disease)    a. 07/07/2011 anterior MI- Occluded LAD w/ emergent CABG x 2 (SVG->LAD->D1).  . 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 Radiation initiated 09/2011  . Hemorrhoid   . History of  radiation therapy 09/05/11-10/12/11   thyoma  . HTN (hypertension)   . Hypothyroidism   . Ischemic cardiomyopathy    a. 07/17/11 Echo: EF 35%;  b. 09/19/2011 Echo: EF 30-35%, Mid-Dist AntSept & Apical AK, mild MR.  Small residual VSD.  Marland Kitchen Osteoarthritis of knee    Bilateral.  a. s/p LTKA ~2007;  b. s/p RTKA 10/2010  . Seasonal allergies   . Shortness of breath    recently SOB  . VSD (ventricular septal defect)    a.  07/07/2011 - Noted @ time of MI - s/p repair @ time of CABG;  b. 09/19/2011 Echo: small residula VSD (present on 2/11 echo also)    Past Surgical History:  Procedure Laterality Date  . ABDOMINAL HYSTERECTOMY    . CATARACT EXTRACTION, BILATERAL    . CORONARY ARTERY BYPASS GRAFT  07/07/2011   Procedure: CORONARY ARTERY BYPASS GRAFTING (CABG);  Surgeon: Tharon Aquas Adelene Idler, MD;  Location: [facility];  Service: Open Heart Surgery;  Laterality: N/A;  times two using greater saphenous vein harvested via endovascular vein harvest  . EYE SURGERY Bilateral    cataract  . HEMORRHOID SURGERY    . JOINT REPLACEMENT    . LEFT HEART CATHETERIZATION WITH CORONARY ANGIOGRAM N/A 07/07/2011   Procedure: LEFT HEART CATHETERIZATION WITH CORONARY ANGIOGRAM;  Surgeon: Josue Hector, MD;  Location: [facility] CATH LAB;  Service: Cardiovascular;  Laterality: N/A;  . LUMBAR LAMINECTOMY/DECOMPRESSION MICRODISCECTOMY Left 08/19/2015   Procedure: Left Lumbar four-five Microdiskectomy;  Surgeon: Eustace Moore, MD;  Location: [facility] NEURO ORS;  Service: Neurosurgery;  Laterality: Left;  left  . MASS EXCISION  07/07/2011   Procedure: EXCISION MASS;  Surgeon: Tharon Aquas Adelene Idler, MD;  Location: [facility];  Service: Open Heart Surgery;  Laterality: N/A;  Excision of mediastinal mass  . MYRINGOTOMY WITH TUBE PLACEMENT Right 10/24/2012   Procedure: MYRINGOTOMY WITH TUBE PLACEMENT;  Surgeon: Melissa Montane, MD;  Location: [facility];  Service: ENT;  Laterality: Right;  . NASAL ENDOSCOPY WITH EPISTAXIS CONTROL N/A 10/24/2012   Procedure: NASAL ENDOSCOPY  WITH EPISTAXIS CONTROL;  Surgeon: Melissa Montane, MD;  Location: [facility];  Service: ENT;  Laterality: N/A;  nasal endoscopy without epistaxis control... only need 30 minutes   . RIGHT HEART CATHETERIZATION  07/07/2011   Procedure: RIGHT HEART CATH;  Surgeon: Josue Hector, MD;  Location: [facility] CATH LAB;  Service: Cardiovascular;;  . TONSILLECTOMY    . TOTAL KNEE ARTHROPLASTY     a.  Left ~ 2007, Right 10/2010  . VSD REPAIR  07/07/2011   Procedure: VENTRICULAR SEPTAL DEFECT (VSD) REPAIR;  Surgeon: Tharon Aquas Trigt III, MD;  Location: [facility];  Service: Open Heart Surgery;  Laterality: N/A;  with bovine pericardium 4cm x 4cm     reports that she has never smoked. She has never used smokeless tobacco. She reports that she does not drink alcohol or use drugs.  Allergies  Allergen Reactions  . Amoxapine And Related Hives  . Clozapine Hives    Family History  Problem Relation Age of Onset  . Heart disease Mother      Prior to Admission medications   Medication Sig Start Date End Date Taking? Authorizing Provider  amoxicillin-clavulanate (AUGMENTIN) 500-125 MG tablet Take 1 tablet by mouth 2 (two) times daily. 10/18/18   [provider]  aspirin EC 81 MG tablet Take 81 mg by mouth daily.    [provider]  Calcium 500-100 MG-UNIT CHEW Chew by mouth.    [provider]  carvedilol (COREG) 6.25 MG tablet Take 1 tablet (6.25 mg total) by mouth 2 (two) times daily with a meal. 09/22/11   Theora Gianotti, NP  cephALEXin (KEFLEX) 500 MG capsule Take 500 mg by mouth 2 (two) times daily. 08/31/18   [provider]  cholecalciferol (VITAMIN D) 1000 units tablet Take 1,000 Units by mouth daily.    [provider]  furosemide (LASIX) 20 MG tablet Take 20 mg by mouth daily.  09/18/16   [provider]  HYDROcodone-acetaminophen (NORCO/VICODIN) 5-325 MG tablet Take 1-2 tablets by mouth every 6 hours as needed for pain. 04/07/17   Pisciotta, Elmyra Ricks, PA-C   Ibuprofen-Diphenhydramine HCl (ADVIL PM) 200-25 MG CAPS Take 1 tablet by mouth at bedtime. sleep    [provider]  levothyroxine (SYNTHROID, LEVOTHROID) 50 MCG tablet Take 25 mcg by mouth daily before breakfast.     [provider]  linagliptin (TRADJENTA) 5 MG TABS tablet Take 2.5 mg by mouth daily.     [provider]  losartan (COZAAR) 100 MG tablet Take 100 mg by mouth daily. 10/26/14   [provider]  metFORMIN (GLUCOPHAGE) 500 MG tablet Take 500 mg by mouth 2 (two) times daily with a meal.  02/07/14   [provider]  mupirocin ointment (BACTROBAN) 2 % APPLY OINTMENT TOPICALLY TO AFFECTED AREA THREE TIMES DAILY FOR 7 DAYS 07/11/18   [provider]  naproxen sodium (ALEVE) 220 MG tablet Take 220 mg by mouth 2 (two) times daily as needed. Pain    [provider]  omeprazole (PRILOSEC) 20 MG capsule Take 20 mg by mouth daily.  10/19/16   [provider]  oxybutynin (DITROPAN) 5 MG tablet Take 5 mg by mouth daily.  06/30/16   [provider]  pneumococcal 23 valent vaccine (PNEUMOVAX 23) 25 MCG/0.5ML injection Pneumovax-23 25 mcg/0.5 mL injection syringe  INJECT 0.5ML INTRAMUSCULARLY ONCE    [provider]  simvastatin (ZOCOR) 40 MG tablet Take 40 mg by mouth every evening.    [provider]  solifenacin (VESICARE) 5 MG tablet Take 5 mg by mouth daily. 11/10/18   [provider]  tolterodine (DETROL LA) 4 MG 24 hr capsule Take 4 mg by mouth daily. 09/28/18   [provider]  pantoprazole (PROTONIX) 40 MG tablet Take 1 tablet (40 mg total) by mouth daily at 12 noon. 07/18/11 08/17/11  Suzzanne Cloud L, PA-C  ramipril (ALTACE) 5 MG capsule Take 1 capsule (5 mg total) by mouth daily. 07/31/11 08/17/11  Cathlyn Parsons, PA-C    Physical Exam: Vitals:   04/22/19 1337 04/22/19 1400 04/22/19 1404 04/22/19 1430  BP:  (!) 177/165  111/68  Pulse:  81    Resp:  17  17  Temp:   98.7 F (37.1  C)   TempSrc:   Oral   SpO2:  93%    Weight: 68 kg     Height: 5\' 1"  (1.549 m)       Constitutional: NAD, calm, comfortable Vitals:   04/22/19 1337 04/22/19 1400 04/22/19 1404 04/22/19 1430  BP:  (!) 177/165  111/68  Pulse:  81    Resp:  17  17  Temp:   98.7 F (37.1 C)   TempSrc:   Oral   SpO2:  93%    Weight: 68 kg     Height: 5\' 1"  (1.549 m)      General: Very pleasant, hard of hearing, resting comfortably in no apparent distress Eyes: PERRL, EOMI, anicteric sclera ENMT: Mucous membranes are moist. Posterior pharynx clear of any exudate or lesions.Normal dentition.  Neck: normal, supple, no masses, no thyromegaly Respiratory: clear to auscultation bilaterally, no wheezing, no crackles. Normal respiratory effort. No accessory muscle use.  Cardiovascular: Regular rate and rhythm, no murmurs / rubs / gallops. No extremity edema. 2+ pedal pulses. No carotid bruits.  Abdomen: no tenderness, no masses palpated. No hepatosplenomegaly. Bowel sounds positive.  Musculoskeletal: no clubbing / cyanosis. No joint deformity upper and lower extremities. Good ROM, no contractures to bilateral upper extremities and right lower extremity.  She does have tenderness to the lateral aspect of hip upon palpation, however dorsiflexion of the left foot elicits sharp pain in her hip.  Hip is not rotated Skin: no rashes, lesions, ulcers. No induration Neurologic: CN 2-12 grossly intact.  Apart from left lower extremity strength and sensation is grossly intact.  Strength in left lower extremity limited by pain Psychiatric: Pleasant affect and mood    Labs on Admission: I have personally reviewed following labs and imaging studies  CBC: Recent Labs  Lab 04/22/19 1442  WBC 11.1*  HGB 11.0*  HCT 35.4*  MCV 89.6  PLT [id number]   Basic Metabolic Panel: Recent Labs  Lab 04/22/19 1442  NA 139  K 4.5  CL 109  CO2 22  GLUCOSE 162*  BUN 34*  CREATININE 1.41*  CALCIUM 8.9   GFR: Estimated  Creatinine Clearance: 22.5 mL/min (A) (by C-G formula based on SCr of 1.41 mg/dL (H)). Liver Function Tests: No results for input(s): AST, ALT, ALKPHOS, BILITOT, PROT, ALBUMIN in the last 168 hours. No results for input(s): LIPASE, AMYLASE in the last 168 hours. No results for input(s): AMMONIA in the last 168 hours. Coagulation Profile: No results for input(s): INR, PROTIME in the last 168 hours. Cardiac Enzymes: No results for input(s): CKTOTAL, CKMB, CKMBINDEX, TROPONINI in the last 168 hours. BNP (last 3 results) No results for input(s): PROBNP in the last 8760 hours. HbA1C: No results for input(s): HGBA1C in the last 72 hours. CBG: No results for input(s): GLUCAP in the last 168 hours. Lipid Profile: No results for input(s): CHOL, HDL, LDLCALC, TRIG, CHOLHDL, LDLDIRECT in the last 72 hours. Thyroid Function Tests: No results for input(s): TSH, T4TOTAL, FREET4, T3FREE, THYROIDAB in the last 72 hours. Anemia Panel: No results for input(s): VITAMINB12, FOLATE, FERRITIN, TIBC, IRON, RETICCTPCT in the last 72 hours. Urine analysis:    Component Value Date/Time   COLORURINE YELLOW 07/19/2011 [place] 07/19/2011 1755   LABSPEC 1.015 07/19/2011 1755   PHURINE 7.0 07/19/2011 [place] 07/19/2011 1755  HGBUR NEGATIVE 07/19/2011 [place] 07/19/2011 [place] 07/19/2011 1755   PROTEINUR NEGATIVE 07/19/2011 1755   UROBILINOGEN 1.0 07/19/2011 1755   NITRITE NEGATIVE 07/19/2011 1755   LEUKOCYTESUR SMALL (A) 07/19/2011 1755    Radiological Exams on Admission: Dg Lumbar Spine Complete  Result Date: 04/22/2019 CLINICAL DATA:  Recent fall.  Left hip pain.  Weakness. EXAM: LUMBAR SPINE - COMPLETE 4+ VIEW COMPARISON:  Lateral lumbar spine in the OR 08/19/2015. CT lumbar spine 03/25/2015 FINDINGS: Negative for acute fracture. Mild depression superior endplate of 075-GRM appear chronic. Multilevel disc and facet degeneration.  Progressive grade 1 anterolisthesis L4-5 compatible with disc and facet degeneration. Atherosclerotic aorta IMPRESSION: Lumbar spine degenerative change. Progressive degenerative anterolisthesis at L4-5 Negative for acute fracture. Electronically Signed   By: Franchot Gallo M.D.   On: 04/22/2019 14:39   Dg Hip Unilat W Or Wo Pelvis 2-3 Views Left  Result Date: 04/22/2019 CLINICAL DATA:  Recent fall.  Left hip pain EXAM: DG HIP (WITH OR WITHOUT PELVIS) 2-3V LEFT COMPARISON:  None. FINDINGS: Negative for acute fracture or dislocation. Degenerative change in spurring of the acetabulum on the left. Mild to moderate degenerative changes in the right hip. No pelvic fracture. IMPRESSION: Negative for acute fracture. Electronically Signed   By: Franchot Gallo M.D.   On: 04/22/2019 14:40   Assessment/Plan NAQUITA HOLLAND is a 85 y.o. female with medical history significant of hypertension, hypothyroidism, diabetes, hyperlipidemia, coronary disease/status post CABG, VSD status post patch repair, lumbar radiculopathy s/p surgery 08/2015, and overactive bladder who presents with left hip pain.  # L. Hip pain - unsure of etiology, mild trauma reported three days prior to severe onset of pain, notably brought upon by dorsiflexion of L. Foot and palpation over joint.  Will obtain MRI to assist in diagnosis, plain films unrevealing of acute pathology - continue pain control - continue vitamin D - PT/OT tomorrow  #ICMP/CAD # VSD s/p Repair # Chronic systolic heart failure - no symptoms of angina, no orthopnea (thought patient sleeps chronically in chair for > 25 years) and no recent Echo, prior EF 30-35% in 2015 - patient follows with Cardiology, Dr. Johnsie Cancel (most recently 04/14/2019) - continue lasix, losartan (renal function at baseline) and carvedilol - continue simvastatin  # HTN - continue carvedilol, losartan  # T2DM - held metformin, continued linagliptin - ISS and accuchecks  # HLD - continue  simvastatin  # Hypothyroidism - continue levothyroxine   DVT prophylaxis: Lovenox Code Status: DNR/DNI Family Communication: Daughter at bedside Disposition Plan: pending Admission status: observation   Truddie Hidden MD [facility] Pager [phone]   If 7PM-7AM, please contact night-coverage [email or web address] Password [email or web address]  04/22/2019, 4:07 PM

## 2019-04-22 NOTE — Progress Notes (Signed)
Pt in MRI department waiting for meds due to claustrophobia. MRI will be delayed.

## 2019-04-22 NOTE — Progress Notes (Signed)
Called ED at 1605 to speak with RN about PT no answer.

## 2019-04-22 NOTE — Plan of Care (Signed)
Plan of care discussed.   

## 2019-04-22 NOTE — ED Provider Notes (Signed)
[facility] DEPT Provider Note   CSN: [id number] Arrival date & time: 04/22/19  1303     History   Chief Complaint Chief Complaint  Patient presents with  . Fall    Hip pain    HPI [name] is a 85 y.o. female.     HPI Pt slid and fell out of her chair on Friday.  The recliner was not up and she ended up sliding off.  Pt had been doing fine but since then she has been pain having pain when she moves.  When she is still she feels fine.  The pain is in the left hip area.  When she tries to stand she has put in her hip.  It hurts for her to move her leg.  She has not taken anything for pain.  SHe tried to call her back and primary care doctor but did not receive a call. Past Medical History:  Diagnosis Date  . Acute MI, anterior wall ([place])    a. 07/07/2011  . CAD (coronary artery disease)    a. 07/07/2011 anterior MI- Occluded LAD w/ emergent CABG x 2 (SVG->LAD->D1).  . 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 Radiation initiated 09/2011  . Hemorrhoid   . History of radiation therapy 09/05/11-10/12/11   thyoma  . HTN (hypertension)   . Hypothyroidism   . Ischemic cardiomyopathy    a. 07/17/11 Echo: EF 35%;  b. 09/19/2011 Echo: EF 30-35%, Mid-Dist AntSept & Apical AK, mild MR.  Small residual VSD.  Marland Kitchen Osteoarthritis of knee    Bilateral.  a. s/p LTKA ~2007;  b. s/p RTKA 10/2010  . Seasonal allergies   . Shortness of breath    recently SOB  . VSD (ventricular septal defect)    a.  07/07/2011 - Noted @ time of MI - s/p repair @ time of CABG;  b. 09/19/2011 Echo: small residula VSD (present on 2/11 echo also)    Patient Active Problem List   Diagnosis Date Noted  . Pain in left knee 06/07/2017  . Acute non-infective otitis externa of right ear 10/31/2016  . Impacted cerumen of both ears 12/27/2015  . Marginal perforation of tympanic membrane of right ear 12/27/2015  . Sensorineural hearing loss (SNHL), bilateral  12/27/2015  . S/P lumbar laminectomy 08/19/2015  . Ischemic cardiomyopathy   . CHF (congestive heart failure) ([place]) 08/18/2011  . Pleural effusion 08/18/2011  . Cellulitis 08/18/2011  . Dyspnea 08/17/2011  . 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 Physical deconditioning 07/19/2011  . Cancer ([place]) 07/08/2011  . Acute anterior wall MI ([place]) 07/07/2011  . Myocardial infarction ([place]) 07/07/2011  . HTN (hypertension)   . Osteoarthritis of knee   . CAD (coronary artery disease)   . VSD (ventricular septal defect)     Past Surgical History:  Procedure Laterality Date  . ABDOMINAL HYSTERECTOMY    . CATARACT EXTRACTION, BILATERAL    . CORONARY ARTERY BYPASS GRAFT  07/07/2011   Procedure: CORONARY ARTERY BYPASS GRAFTING (CABG);  Surgeon: Tharon Aquas Adelene Idler, MD;  Location: [facility];  Service: Open Heart Surgery;  Laterality: N/A;  times two using greater saphenous vein harvested via endovascular vein harvest  . EYE SURGERY Bilateral    cataract  . HEMORRHOID SURGERY    . JOINT REPLACEMENT    . LEFT HEART CATHETERIZATION WITH CORONARY ANGIOGRAM N/A 07/07/2011   Procedure: LEFT HEART CATHETERIZATION WITH CORONARY ANGIOGRAM;  Surgeon:  Josue Hector, MD;  Location: [facility] CATH LAB;  Service: Cardiovascular;  Laterality: N/A;  . LUMBAR LAMINECTOMY/DECOMPRESSION MICRODISCECTOMY Left 08/19/2015   Procedure: Left Lumbar four-five Microdiskectomy;  Surgeon: Eustace Moore, MD;  Location: [facility] NEURO ORS;  Service: Neurosurgery;  Laterality: Left;  left  . MASS EXCISION  07/07/2011   Procedure: EXCISION MASS;  Surgeon: Tharon Aquas Adelene Idler, MD;  Location: [facility];  Service: Open Heart Surgery;  Laterality: N/A;  Excision of mediastinal mass  . MYRINGOTOMY WITH TUBE PLACEMENT Right 10/24/2012   Procedure: MYRINGOTOMY WITH TUBE PLACEMENT;  Surgeon: Melissa Montane, MD;  Location: [facility];  Service: ENT;  Laterality: Right;  . NASAL ENDOSCOPY WITH EPISTAXIS CONTROL N/A 10/24/2012   Procedure: NASAL  ENDOSCOPY WITH EPISTAXIS CONTROL;  Surgeon: Melissa Montane, MD;  Location: [facility];  Service: ENT;  Laterality: N/A;  nasal endoscopy without epistaxis control... only need 30 minutes   . RIGHT HEART CATHETERIZATION  07/07/2011   Procedure: RIGHT HEART CATH;  Surgeon: Josue Hector, MD;  Location[facility];  Service: Cardiovascular;;  . TONSILLECTOMY    . TOTAL KNEE ARTHROPLASTY     a.  Left ~ 2007, Right 10/2010  . VSD REPAIR  07/07/2011   Procedure: VENTRICULAR SEPTAL DEFECT (VSD) REPAIR;  Surgeon: Len Childs, MD;  Location: [facility];  Service: Open Heart Surgery;  Laterality: N/A;  with bovine pericardium 4cm x 4cm     OB History   No obstetric history on file.      Home Medications    Prior to Admission medications   Medication Sig Start Date End Date Taking? Authorizing Provider  amoxicillin-clavulanate (AUGMENTIN) 500-125 MG tablet Take 1 tablet by mouth 2 (two) times daily. 10/18/18   [provider]  aspirin EC 81 MG tablet Take 81 mg by mouth daily.    [provider]  Calcium 500-100 MG-UNIT CHEW Chew by mouth.    [provider]  carvedilol (COREG) 6.25 MG tablet Take 1 tablet (6.25 mg total) by mouth 2 (two) times daily with a meal. 09/22/11   Theora Gianotti, NP  cephALEXin (KEFLEX) 500 MG capsule Take 500 mg by mouth 2 (two) times daily. 08/31/18   [provider]  cholecalciferol (VITAMIN D) 1000 units tablet Take 1,000 Units by mouth daily.    [provider]  furosemide (LASIX) 20 MG tablet Take 20 mg by mouth daily.  09/18/16   [provider]  HYDROcodone-acetaminophen (NORCO/VICODIN) 5-325 MG tablet Take 1-2 tablets by mouth every 6 hours as needed for pain. 04/07/17   Pisciotta, Elmyra Ricks, PA-C  Ibuprofen-Diphenhydramine HCl (ADVIL PM) 200-25 MG CAPS Take 1 tablet by mouth at bedtime. sleep    [provider]  levothyroxine (SYNTHROID, LEVOTHROID) 50 MCG tablet Take 25 mcg by mouth daily before  breakfast.     [provider]  linagliptin (TRADJENTA) 5 MG TABS tablet Take 2.5 mg by mouth daily.     [provider]  losartan (COZAAR) 100 MG tablet Take 100 mg by mouth daily. 10/26/14   [provider]  metFORMIN (GLUCOPHAGE) 500 MG tablet Take 500 mg by mouth 2 (two) times daily with a meal.  02/07/14   [provider]  mupirocin ointment (BACTROBAN) 2 % APPLY OINTMENT TOPICALLY TO AFFECTED AREA THREE TIMES DAILY FOR 7 DAYS 07/11/18   [provider]  naproxen sodium (ALEVE) 220 MG tablet Take 220 mg by mouth 2 (two) times daily as needed. Pain  [provider]  omeprazole (PRILOSEC) 20 MG capsule Take 20 mg by mouth daily.  10/19/16   [provider]  oxybutynin (DITROPAN) 5 MG tablet Take 5 mg by mouth daily.  06/30/16   [provider]  pneumococcal 23 valent vaccine (PNEUMOVAX 23) 25 MCG/0.5ML injection Pneumovax-23 25 mcg/0.5 mL injection syringe  INJECT 0.5ML INTRAMUSCULARLY ONCE    [provider]  simvastatin (ZOCOR) 40 MG tablet Take 40 mg by mouth every evening.    [provider]  solifenacin (VESICARE) 5 MG tablet Take 5 mg by mouth daily. 11/10/18   [provider]  tolterodine (DETROL LA) 4 MG 24 hr capsule Take 4 mg by mouth daily. 09/28/18   [provider]  pantoprazole (PROTONIX) 40 MG tablet Take 1 tablet (40 mg total) by mouth daily at 12 noon. 07/18/11 08/17/11  Suzzanne Cloud L, PA-C  ramipril (ALTACE) 5 MG capsule Take 1 capsule (5 mg total) by mouth daily. 07/31/11 08/17/11  Angiulli, Lavon Paganini, PA-C    Family History Family History  Problem Relation Age of Onset  . Heart disease Mother     Social History Social History   Tobacco Use  . Smoking status: Never Smoker  . Smokeless tobacco: Never Used  Substance Use Topics  . Alcohol use: No  . Drug use: No     Allergies   Amoxapine and related and Clozapine   Review of Systems Review of Systems  All other  systems reviewed and are negative.    Physical Exam Updated Vital Signs BP 111/68   Pulse 81   Temp 98.7 F (37.1 C) (Oral)   Resp 17   Ht 1.549 m (5\' 1" )   Wt 68 kg   SpO2 93%   BMI 28.34 kg/m   Physical Exam Vitals signs and nursing note reviewed.  Constitutional:      General: She is not in acute distress.    Appearance: She is well-developed.     Comments: Elderly   HENT:     Head: Normocephalic and atraumatic.     Right Ear: External ear normal.     Left Ear: External ear normal.  Eyes:     General: No scleral icterus.       Right eye: No discharge.        Left eye: No discharge.     Conjunctiva/sclera: Conjunctivae normal.  Neck:     Musculoskeletal: Neck supple.     Trachea: No tracheal deviation.  Cardiovascular:     Rate and Rhythm: Normal rate and regular rhythm.  Pulmonary:     Effort: Pulmonary effort is normal. No respiratory distress.     Breath sounds: Normal breath sounds. No stridor. No wheezing or rales.  Abdominal:     General: Bowel sounds are normal. There is no distension.     Palpations: Abdomen is soft.     Tenderness: There is no abdominal tenderness. There is no guarding or rebound.  Musculoskeletal:        General: Tenderness present.     Right shoulder: She exhibits tenderness and bony tenderness.     Left hip: She exhibits tenderness and bony tenderness.     Lumbar back: Normal.     Comments: Pain with raising left leg   Skin:    General: Skin is warm and dry.     Findings: No rash.  Neurological:     Mental Status: She is alert.     Cranial Nerves: No cranial nerve  deficit (no facial droop, extraocular movements intact, no slurred speech).     Sensory: No sensory deficit.     Motor: No abnormal muscle tone or seizure activity.     Coordination: Coordination normal.     Comments: 5/5 plantar and dorsi bilaterally, normal sensation bilateral lower extrem       ED Treatments / Results  Labs (all labs ordered are listed,  but only abnormal results are displayed) Labs Reviewed  CBC - Abnormal; Notable for the following components:      Result Value   WBC 11.1 (*)    Hemoglobin 11.0 (*)    HCT 35.4 (*)    All other components within normal limits  BASIC METABOLIC PANEL - Abnormal; Notable for the following components:   Glucose, Bld 162 (*)    BUN 34 (*)    Creatinine, Ser 1.41 (*)    GFR calc non Af Amer 32 (*)    GFR calc Af Amer 37 (*)    All other components within normal limits  SARS CORONAVIRUS 2 (TAT 6-24 HRS)    EKG None  Radiology Dg Lumbar Spine Complete  Result Date: 04/22/2019 CLINICAL DATA:  Recent fall.  Left hip pain.  Weakness. EXAM: LUMBAR SPINE - COMPLETE 4+ VIEW COMPARISON:  Lateral lumbar spine in the OR 08/19/2015. CT lumbar spine 03/25/2015 FINDINGS: Negative for acute fracture. Mild depression superior endplate of 075-GRM appear chronic. Multilevel disc and facet degeneration. Progressive grade 1 anterolisthesis L4-5 compatible with disc and facet degeneration. Atherosclerotic aorta IMPRESSION: Lumbar spine degenerative change. Progressive degenerative anterolisthesis at L4-5 Negative for acute fracture. Electronically Signed   By: Franchot Gallo M.D.   On: 04/22/2019 14:39   Dg Hip Unilat W Or Wo Pelvis 2-3 Views Left  Result Date: 04/22/2019 CLINICAL DATA:  Recent fall.  Left hip pain EXAM: DG HIP (WITH OR WITHOUT PELVIS) 2-3V LEFT COMPARISON:  None. FINDINGS: Negative for acute fracture or dislocation. Degenerative change in spurring of the acetabulum on the left. Mild to moderate degenerative changes in the right hip. No pelvic fracture. IMPRESSION: Negative for acute fracture. Electronically Signed   By: Franchot Gallo M.D.   On: 04/22/2019 14:40    Procedures Procedures (including critical care time)  Medications Ordered in ED Medications  morphine 4 MG/ML injection 4 mg (has no administration in time range)  LORazepam (ATIVAN) injection 0.5 mg (has no administration in  time range)  morphine 4 MG/ML injection 4 mg (4 mg Intravenous Given 04/22/19 1440)     Initial Impression / Assessment and Plan / ED Course  I have reviewed the triage vital signs and the nursing notes.  Pertinent labs & imaging results that were available during my care of the patient were reviewed by me and considered in my medical decision making (see chart for details).  Clinical Course as of Apr 21 1602  Tue Apr 22, 2019  1511 X-rays without signs of fracture.  Degenerative changes noted.   [id number] Patient is still having severe pain.  Radiating down the left leg.  Initial x-rays do not show signs of fracture.   [id number] Discussed initial findings with patient and family.  Additional pain meds have been ordered.  We will proceed with MRI lumbar spine and hip.  Patient will require admission for pain management.   [JK]    Clinical Course User Index [JK] Dorie Rank, MD     Patient presented with worsening leg pain.  Slid out of her  chair a few days ago but no direct fall on her hip.  Patient does have history of lumbar radiculopathy requiring surgery.  She feels that the symptoms may be similar.  Patient has not responded to pain management in the ED.  X-rays do not show acute fractures.  I think the patient will benefit from MRI of both the lumbar spine and hip to assess for lumbar radiculopathy as well as occult hip fracture.  Plan admission to the hospital for pain management and further treatment.  Final Clinical Impressions(s) / ED Diagnoses   Final diagnoses:  Acute left-sided low back pain with left-sided sciatica      Dorie Rank, MD 04/22/19 [phone]

## 2019-04-22 NOTE — Progress Notes (Signed)
Lovenox per Pharmacy for DVT Prophylaxis    Pharmacy has been consulted from dosing enoxaparin (lovenox) in this patient for DVT prophylaxis.  The pharmacist has reviewed pertinent labs (Hgb _11__; PLT_259__), patient weight (__68_kg) and renal function (CrCl_22__mL/min) and decided that enoxaparin 30__mg SQ Q24Hrs is appropriate for this patient.  The pharmacy department will sign off at this time.  Please reconsult pharmacy if status changes or for further issues.  Thank you  Cyndia Diver PharmD, BCPS  04/22/2019, 9:43 PM

## 2019-04-22 NOTE — ED Triage Notes (Signed)
Patient arrived via EMS. Pt c/o falling last Friday and having severe L hip pain that started last night. Pt states that pain radiates down L leg. Pt told RN that she had surgery for a herniated disk previously and she thinks this pain may be related.

## 2019-04-23 DIAGNOSIS — N183 Chronic kidney disease, stage 3 unspecified: Secondary | ICD-10-CM | POA: Diagnosis not present

## 2019-04-23 DIAGNOSIS — E1122 Type 2 diabetes mellitus with diabetic chronic kidney disease: Secondary | ICD-10-CM

## 2019-04-23 DIAGNOSIS — D649 Anemia, unspecified: Secondary | ICD-10-CM

## 2019-04-23 DIAGNOSIS — M25559 Pain in unspecified hip: Secondary | ICD-10-CM

## 2019-04-23 DIAGNOSIS — I5022 Chronic systolic (congestive) heart failure: Secondary | ICD-10-CM | POA: Diagnosis not present

## 2019-04-23 LAB — BASIC METABOLIC PANEL
Anion gap: 8 (ref 5–15)
BUN: 30 mg/dL — ABNORMAL HIGH (ref 8–23)
CO2: 20 mmol/L — ABNORMAL LOW (ref 22–32)
Calcium: 9 mg/dL (ref 8.9–10.3)
Chloride: 110 mmol/L (ref 98–111)
Creatinine, Ser: 1.38 mg/dL — ABNORMAL HIGH (ref 0.44–1.00)
GFR calc Af Amer: 38 mL/min — ABNORMAL LOW (ref >60)
GFR calc non Af Amer: 33 mL/min — ABNORMAL LOW (ref >60)
Glucose, Bld: 115 mg/dL — ABNORMAL HIGH (ref 70–99)
Potassium: 4.5 mmol/L (ref 3.5–5.1)
Sodium: 138 mmol/L (ref 135–145)

## 2019-04-23 LAB — CBC
HCT: 34.6 % — ABNORMAL LOW (ref 36.0–46.0)
Hemoglobin: 10.9 g/dL — ABNORMAL LOW (ref 12.0–15.0)
MCH: 28.4 pg (ref 26.0–34.0)
MCHC: 31.5 g/dL (ref 30.0–36.0)
MCV: 90.1 fL (ref 80.0–100.0)
Platelets: 242 10*3/uL (ref 150–400)
RBC: 3.84 MIL/uL — ABNORMAL LOW (ref 3.87–5.11)
RDW: 14.5 % (ref 11.5–15.5)
WBC: 9.4 10*3/uL (ref 4.0–10.5)
nRBC: 0 % (ref 0.0–0.2)

## 2019-04-23 LAB — GLUCOSE, CAPILLARY
Glucose-Capillary: 121 mg/dL — ABNORMAL HIGH (ref 70–99)
Glucose-Capillary: 221 mg/dL — ABNORMAL HIGH (ref 70–99)
Glucose-Capillary: 128 mg/dL — ABNORMAL HIGH (ref 70–99)
Glucose-Capillary: 226 mg/dL — ABNORMAL HIGH (ref 70–99)

## 2019-04-23 LAB — SARS CORONAVIRUS 2 (TAT 6-24 HRS): SARS Coronavirus 2: NEGATIVE

## 2019-04-23 MED ORDER — SENNOSIDES-DOCUSATE SODIUM 8.6-50 MG PO TABS
1.0000 | ORAL_TABLET | Freq: Two times a day (BID) | ORAL | Status: DC
  Administered 2019-04-23 – 2019-04-24 (×3): 1 via ORAL
  Filled 2019-04-23 (×3): qty 1

## 2019-04-23 MED ORDER — POLYETHYLENE GLYCOL 3350 17 G PO PACK
17.0000 g | PACK | Freq: Two times a day (BID) | ORAL | Status: DC
  Administered 2019-04-23: 17 g via ORAL
  Filled 2019-04-23 (×3): qty 1

## 2019-04-23 MED ORDER — BISACODYL 10 MG RE SUPP: 10.0000 mg | Freq: Every day | RECTAL | Status: DC | PRN

## 2019-04-23 NOTE — Progress Notes (Signed)
    Durable Medical Equipment  (From admission, onward)         Start     Ordered   04/23/19 1306  For home use only DME Hospital bed  Once    Question Answer Comment  Length of Need 6 Months   The above medical condition requires: Patient requires the ability to reposition frequently   Bed type Semi-electric   Support Surface: Gel Overlay      04/23/19 1305

## 2019-04-23 NOTE — Evaluation (Signed)
Physical Therapy Evaluation Patient Details Name: [name] MRN: [id number] DOB: June 13, 1924 Today's Date: 04/23/2019   History of Present Illness  85 y.o. female with medical history significant of hypertension, hypothyroidism, diabetes, hyperlipidemia, coronary disease/status post CABG, VSD status post patch repair, lumbar radiculopathy s/p surgery 08/2015, and overactive bladder who presents with left hip pain.  Clinical Impression  Pt admitted with above diagnosis. Pt ambulated 30' with RW, no loss of balance, 1/10 pain. Pt is functioning well enough to DC home with 24* assistance, pt's daughter stated she can provide this. They have needed DME, including a RW and WC, but request PTAR transport home due to having 7 steps to enter home. Social worker aware of this request. Pt currently with functional limitations due to the deficits listed below (see PT Problem List). Pt will benefit from skilled PT to increase their independence and safety with mobility to allow discharge to the venue listed below.          Follow Up Recommendations Home health PT;Supervision/Assistance - 24 hour(pt's daughter can provide 60* assist, she and pt prefer home with HHPT rather than SNF)    Equipment Recommendations  None recommended by PT    Recommendations for Other Services       Precautions / Restrictions Precautions Precautions: Fall Precaution Comments: pt has had several falls this year Restrictions Weight Bearing Restrictions: No Other Position/Activity Restrictions: WBAT      Mobility  Bed Mobility Overal bed mobility: Needs Assistance Bed Mobility: Supine to Sit     Supine to sit: Max assist;+2 for physical assistance     General bed mobility comments: assist to raise trunk and advance BLEs to edge of bed  Transfers Overall transfer level: Needs assistance Equipment used: Rolling walker (2 wheeled) Transfers: Sit to/from Stand Sit to Stand: From elevated surface;Mod  assist         General transfer comment: VCs hand placement, assist to rise  Ambulation/Gait Ambulation/Gait assistance: Min guard Gait Distance (Feet): 30 Feet Assistive device: Rolling walker (2 wheeled) Gait Pattern/deviations: Decreased stride length;Step-through pattern;Decreased step length - right;Decreased step length - left Gait velocity: decr   General Gait Details: VCs sequencing, and to lift head  Stairs            Wheelchair Mobility    Modified Rankin (Stroke Patients Only)       Balance Overall balance assessment: Needs assistance Sitting-balance support: Feet supported Sitting balance-Leahy Scale: Fair     Standing balance support: Bilateral upper extremity supported Standing balance-Leahy Scale: Poor Standing balance comment: BUE support required                             Pertinent Vitals/Pain Pain Assessment: Faces Pain Score: 1  Faces Pain Scale: Hurts a little bit Pain Location: L groin with walking Pain Descriptors / Indicators: Sore Pain Intervention(s): RN gave pain meds during session;Limited activity within patient's tolerance;Monitored during session    Home Living Family/patient expects to be discharged to:: Private residence Living Arrangements: Alone Available Help at Discharge: Family;Available PRN/intermittently   Home Access: Ramped entrance       Home Equipment: [place] - 2 wheels;Walker - 4 wheels;Shower seat;Wheelchair - manual      Prior Function Level of Independence: Needs assistance   Gait / Transfers Assistance Needed: walks with RW or rollator  ADL's / Homemaking Assistance Needed: supervision  Comments: daughter provides supervision for bathing/dressing     Hand  Dominance        Extremity/Trunk Assessment   Upper Extremity Assessment Upper Extremity Assessment: Overall WFL for tasks assessed    Lower Extremity Assessment Lower Extremity Assessment: LLE deficits/detail LLE Deficits  / Details: hip flexion AAROM ~35* supine, limited by pain, hip strength ~ -3/5, L knee ext 3/5 LLE: Unable to fully assess due to pain LLE Sensation: WNL LLE Coordination: WNL    Cervical / Trunk Assessment Cervical / Trunk Assessment: Normal  Communication   Communication: HOH  Cognition Arousal/Alertness: Awake/alert Behavior During Therapy: WFL for tasks assessed/performed Overall Cognitive Status: Within Functional Limits for tasks assessed                                        General Comments      Exercises     Assessment/Plan    PT Assessment Patient needs continued PT services  PT Problem List Decreased strength;Decreased range of motion;Decreased activity tolerance;Decreased balance;Pain;Decreased mobility       PT Treatment Interventions Gait training;Functional mobility training;Therapeutic activities;Therapeutic exercise;Patient/family education    PT Goals (Current goals can be found in the Care Plan section)  Acute Rehab PT Goals Patient Stated Goal: return to living independently PT Goal Formulation: With patient/family Time For Goal Achievement: 05/07/19 Potential to Achieve Goals: Fair    Frequency Min 3X/week   Barriers to discharge        Co-evaluation               AM-PAC PT "6 Clicks" Mobility  Outcome Measure Help needed turning from your back to your side while in a flat bed without using bedrails?: A Lot Help needed moving from lying on your back to sitting on the side of a flat bed without using bedrails?: A Lot Help needed moving to and from a bed to a chair (including a wheelchair)?: A Little Help needed standing up from a chair using your arms (e.g., wheelchair or bedside chair)?: A Lot Help needed to walk in hospital room?: A Little Help needed climbing 3-5 steps with a railing? : A Lot 6 Click Score: 14    End of Session Equipment Utilized During Treatment: Gait belt Activity Tolerance: Patient limited by  fatigue;Patient tolerated treatment well Patient left: in chair;with call bell/phone within reach;with family/visitor present Nurse Communication: Mobility status PT Visit Diagnosis: Difficulty in walking, not elsewhere classified (R26.2);Pain;History of falling (Z91.81) Pain - Right/Left: Left Pain - part of body: Hip    Time: 1023-1105 PT Time Calculation (min) (ACUTE ONLY): 42 min   Charges:   PT Evaluation $PT Eval Moderate Complexity: 1 Mod PT Treatments $Gait Training: 8-22 mins $Therapeutic Activity: 8-22 mins       Blondell Reveal Kistler PT 04/23/2019  Acute Rehabilitation Services Pager [phone] Office [phone]

## 2019-04-23 NOTE — Progress Notes (Signed)
PROGRESS NOTE    [name]  [id number] DOB: 1924-10-12 DOA: 04/22/2019 PCP: Scheryl Marten, PA  Brief Narrative:  HPI per Dr. Truddie Hidden on 04/22/2019 [name] is a 85 y.o. female with medical history significant of hypertension, hypothyroidism, diabetes, hyperlipidemia, coronary disease/status post CABG, VSD status post patch repair, lumbar radiculopathy s/p surgery 08/2015, and overactive bladder who presents with left hip pain.  Patient accompanied by her daughter who assists with history.  Patient was in usual state of health through this past Friday.  She lives independently and has routine visits from her daughter.  Friday night patient fell asleep in her recliner not in her usual chair.  She states around 1 AM, early Saturday morning she slowly slid down on her recliner and fell to the floor.  She states it was not very traumatic but she landed on her bottom and she was unable to get up.  She did not hit her head.  Recalls the entire event.  She denies any loss of consciousness.  She called EMS and they were able to lift her back up and she was able to stand and walk around with her walker.  She states after this she eventually did fall asleep in her regular chair.  Her daughter visited her the next day and they went shopping on both Saturday and Sunday.  The patient was able to ambulate without notable discomfort.  The daughter reports that she did not know about the event on Friday night until this morning.  So if there were new changes she was not closely pay attention.  She did state she noticed that she had to assist her mom getting in and out of the car and had to lift her left leg up in and out.  The patient does states she did not really notice much of a change over the weekend nor on Monday.  However Monday night after going to bed in her chair she reports that she was having severe left hip pain that would make her cry out every 15 minutes.  She reports the  pain was so severe she did not sleep throughout the night.  Her daughter reports she had gone to her dentist appointment and went to check in on her mom soon after.  And while she was there she notes that her mom was in much more pain and cannot ambulate except for once to the bathroom and back.  She is followed closely behind her and noticed that she was dragging her left foot.  She states they were supposed to see her PCP this afternoon but when she was seated in her chair she almost slid out again so they decided to come in by EMS.  The patient states the pain is mostly located in the left hip.  She states it hurts when she presses on it.  She does not really describe a shooting pain down her leg.  She states though if she were to put pressure on her foot she feels pain in her hip.  Besides sliding out of the chair she denies any awkward falls or trauma.  Otherwise she has been in her usual state of health.  She has been taking her medications daily without issue.  She denies any fevers, chills, shortness of breath, cough.  She denies any nausea, vomiting.  She has a good appetite.  She does report that she does have poor sleep mostly because she has to wake up in the night  to urinate.  She takes an afternoon nap.  She is a non-smoker, no alcohol use, no drug use.  Her daughter states she lives in a house filled with smoke however so she does have an occasional cough but nothing unusual.  **Interim History  Orthopedic surgery and neurosurgery were called discussed the case and they recommend nonoperative measurements.  PT OT recommending home health and unfortunately unable to obtain a hospital bed today so likely will be discharged home in the a.m. as her pain is controlled this morning.  Assessment & Plan:   Active Problems:   Hip pain  L. Hip pain in the setting of Torn Distal Iliopsoas Tendon and Chronic partial tearing of the Left Gluteus Medicas and Minuimus Tendons Distally and the  Greater Trocanter  -Likely in the setting mild trauma reported three days prior to severe onset of pain, notably brought upon by dorsiflexion of L. Foot and palpation over joint.   -Obtained MRI to assist in diagnosis, plain films unrevealing of acute pathology -MRI showed "Torn but not ruptured distal iliopsoas tendon, with edema in the iliopsoas musculature and in the adjacent hip adductor musculature. Suspected chronic partial tearing of the left gluteus medius and minimus tendons distally and the greater trochanter. Mild trochanteric bursitis. Mild to moderate degenerative chondral thinning in both hips. Considerable lumbar spondylosis and degenerative disc disease. Sigmoid colon diverticulosis.  2.6 by 1.0 cm lesion in the lower femoral neck, probably Enchondroma." -Continue pain control with Acteaminophen 650 mg po q6hprn Mild Pain, Hydrocodone-Acetaminophen 1-2 tab po q6hprn Moderate Pain/Severe Pain, and will discontinue Naproxen 250 mg po BIDprn -Given IV Morphine 4 mg x1 -Continue Ca-Vitamin D 500-200 mg 1 po Daily along with Cholecalciferol 1,000 units po Daily   -I spoke with Dr. Alvan Dame of Ortho who recommends weightbearing as tolerated along with physical therapy and or occupational therapy and recommends no surgical intervention necessary at this point -PT/OT recommending Home Health PT and OT and patient's family requesting a hospital bed and unfortunately cannot be delivered today likely will be delivered tomorrow for patient to be discharged after it is delivered -Continue bowel regimen given the patient's constipation and have started senna docusate 1 tab p.o. twice daily, MiraLAX 17 g p.o. twice daily, and have a bisacodyl suppository 10 mg daily as needed for moderate constipation  Back Pain -MRI done and showed "Progressive multilevel lumbar spondylosis most pronounced at the L2-3 level where there is now severe canal stenosis and severe left foraminal stenosis. Mild canal stenosis  at the L1-2 level has also progressed from Prior. Previously seen inferior extension of a left subarticular disc extrusion at L4-5 has largely resolved. Right worse than left foraminal and subarticular recess stenosis at L4-5. Stable grade 1 anterolisthesis L5 on S1 secondary to chronic bilateral pars defects" -I spoke with patient's primary neurosurgeon Dr. Sherley Bounds who recommends conservative therapy at this point and recommends PT and OT -Dr. Ronnald Ramp recommends an outpatient steroid injection if possible given that he wants to be as conservative as possible and follow-up with him within 1 to 2 weeks  Constipation -Continue bowel regimen given the patient's constipation and have started senna docusate 1 tab p.o. twice daily, MiraLAX 17 g p.o. twice daily, and have a bisacodyl suppository 10 mg daily as needed for moderate constipation -If necessary patient will need an enema  ICMP/CAD VSD s/p Repair Chronic Systolic Heart Failure and history of ischemic cardiomyopathy with an anterior wall MI in the past -No symptoms of angina, no  orthopnea (thought patient sleeps chronically in chair for > 25 years) and no recent Echo, prior EF 30-35% in 2015 -Appears euvolemic on examination -Patient follows with Cardiology, Dr. Johnsie Cancel (most recently 04/14/2019) -Continue Carvedilol 6.25 mg po BID, Losartan 100 mg po daily and Furosemide 20 mg p.o. daily -Continue Simvastatin 40 mg po qHS -Strict I's and O's and daily weights -Continue to monitor for signs and symptoms of volume overload  HTN -Continue Carvedilol 6.25 mg po BID, Losartan 100 mg po daily and Furosemide 20 mg p.o. daily  Type 2 Diabetes Mellitus Type 2 -Continue to hold  metformin, continued Linagliptin 2.5 mg po Daily  -HbA1c was 6.4 -CBG's ranging from 90-221; blood sugar on BMP this morning was 150 -Continue with ISS and accuchecks  HLD -Continue Simvastatin 40 mg po qHS  Hypothyroidism -Check TSH -Continue Levothyroxine 25  mcg po Daily   CKD stage III Metabolic acidosis -Appears at baseline -Patient BUN/creatinine went from 34/1.41 and is now 30/1.8 -Patient's CO2 is now 20, chloride is 110, and anion gap is 8 -Continue to monitor and trend and avoid nephrotoxic medications, contrast dyes, hypotension and renally adjust medications  -Repeat CMP in a.m.  Normocytic Anemia -Anemia of chronic kidney disease -Patient's hemoglobin/hematocrit went from 11.0/35.4 and is now 10.9/34.6 -Check anemia panel in the a.m. -Continue to monitor for signs and symptoms of bleeding; currently no overt bleeding noted -Repeat CBC in a.m.  DVT prophylaxis: Enoxaparin 30 mg sq q24h Code Status: DO NOT RESUSCITATE  Family Communication: Discussed with Daughter at bedside  Disposition Plan: Anticipate D/C in the next 24-48 hours with Home Health when Hospital Bed can be delivered   Consultants:   Case was discussed with Neurosurgery Dr. Ronnald Ramp  Case was discussed with Orthopedic Surgery Dr. Alvan Dame    Procedures:  None   Antimicrobials:  Anti-infectives (From admission, onward)   None     Subjective: Seen and examined at bedside and had a little bit trouble with dorsiflexion but had decent strength on testing.  No nausea or vomiting.  Denies any complaints right now denies any back pain but had some earlier.  No other concerns or complaints at this time  Objective: Vitals:   04/22/19 2041 04/23/19 0453 04/23/19 0500 04/23/19 1415  BP: (!) 117/51 (!) 150/65  (!) 126/45  Pulse: 83 88  83  Resp: 14 16  19   Temp: 98.3 F (36.8 C) 98 F (36.7 C)  98.6 F (37 C)  TempSrc: Oral Oral  Oral  SpO2: 96% 100%  100%  Weight:   67.9 kg   Height:        Intake/Output Summary (Last 24 hours) at 04/23/2019 1416 Last data filed at 04/23/2019 1153 Gross per 24 hour  Intake 870 ml  Output 1050 ml  Net -180 ml   Filed Weights   04/22/19 1337 04/22/19 2000 04/23/19 0500  Weight: 68 kg 69.1 kg 67.9 kg    Examination: Physical Exam:  Constitutional: WN/WD elderly overweight Caucasian female in NAD and appears calm  Eyes:Lids and conjunctivae normal, sclerae anicteric  ENMT: External Ears, Nose appear normal. Grossly normal hearing. Mucous membranes are moist Neck: Appears normal, supple, no cervical masses, normal ROM, no appreciable thyromegaly; no JVD Respiratory: Diminished to auscultation bilaterally, no wheezing, rales, rhonchi or crackles. Normal respiratory effort and patient is not tachypenic. No accessory muscle use.  Cardiovascular: RRR, Has a 2/6 Murmur. Trace extremity edema. Abdomen: Soft, non-tender, Distended due to body habitus. Bowel sounds positive x4.  GU: Deferred. Musculoskeletal: No clubbing / cyanosis of digits/nails. No joint deformity upper and lower extremities. Skin: No rashes, lesions, ulcers on a limited skin evaluation. No induration; Warm and dry.  Neurologic: CN 2-12 grossly intact with no focal deficits. Romberg sign and cerebellar reflexes not assessed.  Psychiatric: Normal judgment and insight. Alert and oriented x 3. Pleasant mood and appropriate affect.   Data Reviewed: I have personally reviewed following labs and imaging studies  CBC: Recent Labs  Lab 04/22/19 1442 04/23/19 0212  WBC 11.1* 9.4  HGB 11.0* 10.9*  HCT 35.4* 34.6*  MCV 89.6 90.1  PLT 259 [id number]   Basic Metabolic Panel: Recent Labs  Lab 04/22/19 1442 04/23/19 0212  NA 139 138  K 4.5 4.5  CL 109 110  CO2 22 20*  GLUCOSE 162* 115*  BUN 34* 30*  CREATININE 1.41* 1.38*  CALCIUM 8.9 9.0   GFR: Estimated Creatinine Clearance: 22.9 mL/min (A) (by C-G formula based on SCr of 1.38 mg/dL (H)). Liver Function Tests: No results for input(s): AST, ALT, ALKPHOS, BILITOT, PROT, ALBUMIN in the last 168 hours. No results for input(s): LIPASE, AMYLASE in the last 168 hours. No results for input(s): AMMONIA in the last 168 hours. Coagulation Profile: No results for input(s): INR,  PROTIME in the last 168 hours. Cardiac Enzymes: No results for input(s): CKTOTAL, CKMB, CKMBINDEX, TROPONINI in the last 168 hours. BNP (last 3 results) No results for input(s): PROBNP in the last 8760 hours. HbA1C: Recent Labs    04/22/19 1458  HGBA1C 6.4*   CBG: Recent Labs  Lab 04/22/19 2252 04/23/19 0808 04/23/19 1139  GLUCAP 90 121* 221*   Lipid Profile: No results for input(s): CHOL, HDL, LDLCALC, TRIG, CHOLHDL, LDLDIRECT in the last 72 hours. Thyroid Function Tests: No results for input(s): TSH, T4TOTAL, FREET4, T3FREE, THYROIDAB in the last 72 hours. Anemia Panel: No results for input(s): VITAMINB12, FOLATE, FERRITIN, TIBC, IRON, RETICCTPCT in the last 72 hours. Sepsis Labs: No results for input(s): PROCALCITON, LATICACIDVEN in the last 168 hours.  Recent Results (from the past 240 hour(s))  SARS CORONAVIRUS 2 (TAT 6-24 HRS) Nasopharyngeal Nasopharyngeal Swab     Status: None   Collection Time: 04/22/19  4:50 PM   Specimen: Nasopharyngeal Swab  Result Value Ref Range Status   SARS Coronavirus 2 NEGATIVE NEGATIVE Final    Comment: (NOTE) SARS-CoV-2 target nucleic acids are NOT DETECTED. The SARS-CoV-2 RNA is generally detectable in upper and lower respiratory specimens during the acute phase of infection. Negative results do not preclude SARS-CoV-2 infection, do not rule out co-infections with other pathogens, and should not be used as the sole basis for treatment or other patient management decisions. Negative results must be combined with clinical observations, patient history, and epidemiological information. The expected result is Negative. Fact Sheet for Patients: SugarRoll.be Fact Sheet for Healthcare Providers: https://www.woods-mathews.com/ This test is not yet approved or cleared by the [place] FDA and  has been authorized for detection and/or diagnosis of SARS-CoV-2 by FDA under an Emergency Use  Authorization (EUA). This EUA will remain  in effect (meaning this test can be used) for the duration of the COVID-19 declaration under Section 56 4(b)(1) of the Act, 21 U.S.C. section 360bbb-3(b)(1), unless the authorization is terminated or revoked sooner. Performed at [facility], [place] [address], [place], [place] [address]     Radiology Studies: Dg Lumbar Spine Complete  Result Date: 04/22/2019 CLINICAL DATA:  Recent fall.  Left hip pain.  Weakness. EXAM:  LUMBAR SPINE - COMPLETE 4+ VIEW COMPARISON:  Lateral lumbar spine in the OR 08/19/2015. CT lumbar spine 03/25/2015 FINDINGS: Negative for acute fracture. Mild depression superior endplate of 075-GRM appear chronic. Multilevel disc and facet degeneration. Progressive grade 1 anterolisthesis L4-5 compatible with disc and facet degeneration. Atherosclerotic aorta IMPRESSION: Lumbar spine degenerative change. Progressive degenerative anterolisthesis at L4-5 Negative for acute fracture. Electronically Signed   By: Franchot Gallo M.D.   On: 04/22/2019 14:39   Mr Lumbar Spine Wo Contrast  Result Date: 04/22/2019 CLINICAL DATA:  Left hip pain, back pain EXAM: MRI LUMBAR SPINE WITHOUT CONTRAST TECHNIQUE: Multiplanar, multisequence MR imaging of the lumbar spine was performed. No intravenous contrast was administered. COMPARISON:  X-ray 04/22/2019, MRI 07/07/2015 FINDINGS: Segmentation: Standard. Inferior-most well developed disc space is again designated L5-S1 in keeping with numbering convention on prior MRI 07/07/2015. Alignment: 7 mm grade 1 anterolisthesis L5 on S1 secondary to chronic bilateral pars defects. 4 mm grade 1 anterolisthesis L4 on L5. Trace retrolisthesis L1 on L2 and L2 on L3. Vertebrae: No fracture, evidence of discitis, or bone lesion. Discogenic endplate marrow changes most pronounced at L2-3 and L4-5. Conus medullaris and cauda equina: Conus extends to the L1 level. Conus and cauda equina appear normal. Paraspinal and other  soft tissues: No acute findings. Disc levels: L1-L2: Diffuse disc bulge with bilateral facet arthrosis resulting in mild right foraminal stenosis and mild canal stenosis. Findings progressed from prior. L2-L3: Diffuse disc bulge with bilateral facet arthrosis and ligamentum flavum hypertrophy result in severe canal stenosis and severe left foraminal stenosis. There is mild-to-moderate right foraminal stenosis. Findings progressed from prior. L3-L4: Chronic disc height loss with degenerative endplate spurring. Bilateral facet arthrosis result in mild bilateral foraminal stenosis and borderline canal stenosis, not significantly changed from prior. L4-L5: Disc uncovering with superimposed right paracentral disc protrusion. Advanced right worse than left facet arthropathy results in moderate to severe right foraminal and right subarticular recess stenosis. Mild-to-moderate left foraminal and subarticular recess stenosis and mild canal stenosis. Previously seen inferior extension of a left subarticular disc extrusion has largely resolved. L5-S1: Disc uncovering with superimposed diffuse disc bulge and right greater than left facet arthrosis. Findings result in mild bilateral foraminal stenosis without canal stenosis. No significant interval progression from prior. IMPRESSION: 1. Progressive multilevel lumbar spondylosis most pronounced at the L2-3 level where there is now severe canal stenosis and severe left foraminal stenosis. 2. Mild canal stenosis at the L1-2 level has also progressed from prior. 3. Previously seen inferior extension of a left subarticular disc extrusion at L4-5 has largely resolved. 4. Right worse than left foraminal and subarticular recess stenosis at L4-5. 5. Stable grade 1 anterolisthesis L5 on S1 secondary to chronic bilateral pars defects. Electronically Signed   By: Davina Poke M.D.   On: 04/22/2019 19:59   Mr Hip Left Wo Contrast  Result Date: 04/22/2019 CLINICAL DATA:  Fall, left  hip pain. EXAM: MR OF THE LEFT HIP WITHOUT CONTRAST TECHNIQUE: Multiplanar, multisequence MR imaging was performed. No intravenous contrast was administered. COMPARISON:  Radiographs from 04/22/2019 FINDINGS: Bones: 2.6 by 1.0 by 0.9 cm lesion with accentuated T2 signal characteristics in the lower femoral neck common no surrounding marrow edema. The conventional radiographic and MRI characteristics are not totally specific but favor enchondroma. There may be a small similar 0.8 cm lesion in the left femoral shaft. I do not believe this to be a postoperative appearance. Small degenerative subcortical cystic lesion anteriorly along the left femoral head. Spurring about  the left femoral head and acetabulum. Considerable lumbar spondylosis and degenerative disc disease. Articular cartilage and labrum Articular cartilage: Mild to moderate degenerative chondral thinning bilaterally. Labrum:  No paralabral cyst or gross labral tear identified. Joint or bursal effusion Joint effusion:  Absent Bursae: Mild trochanteric bursitis. Muscles and tendons Muscles and tendons: Suspected chronic partial tearing of the gluteus medius and minimus tendons distally and the greater trochanter. Extensive edema within and along the left iliopsoas muscle and distal tendon with tearing of the distal tendon without distinct rupture. I do not see an avulsion fragment on the conventional radiographs, and part of the iloipsoas is believed to attach ses fully to the lesser trochanteric region. There is considerable surrounding marrow edema including marrow edema tracking along the hip adductor musculature which may be sprained. No hamstring discontinuity or tear is identified. Edema in the left iloipsoas extends up to the level of the iliac crest. Other findings Miscellaneous: Sigmoid colon diverticulosis. Urinary bladder unremarkable. Uterus absent. IMPRESSION: 1. Torn but not ruptured distal iliopsoas tendon, with edema in the iliopsoas  musculature and in the adjacent hip adductor musculature. 2. Suspected chronic partial tearing of the left gluteus medius and minimus tendons distally and the greater trochanter. 3. Mild trochanteric bursitis. 4. Mild to moderate degenerative chondral thinning in both hips. 5. Considerable lumbar spondylosis and degenerative disc disease. 6. Sigmoid colon diverticulosis. 7. 2.6 by 1.0 cm lesion in the lower femoral neck, probably enchondroma. Electronically Signed   By: Van Clines M.D.   On: 04/22/2019 19:44   Dg Hip Unilat W Or Wo Pelvis 2-3 Views Left  Result Date: 04/22/2019 CLINICAL DATA:  Recent fall.  Left hip pain EXAM: DG HIP (WITH OR WITHOUT PELVIS) 2-3V LEFT COMPARISON:  None. FINDINGS: Negative for acute fracture or dislocation. Degenerative change in spurring of the acetabulum on the left. Mild to moderate degenerative changes in the right hip. No pelvic fracture. IMPRESSION: Negative for acute fracture. Electronically Signed   By: Franchot Gallo M.D.   On: 04/22/2019 14:40   Scheduled Meds:  aspirin EC  81 mg Oral Daily   calcium-vitamin D  1 tablet Oral Q breakfast   carvedilol  6.25 mg Oral BID WC   cholecalciferol  1,000 Units Oral Daily   darifenacin  7.5 mg Oral Daily   enoxaparin (LOVENOX) injection  30 mg Subcutaneous QHS   furosemide  20 mg Oral Daily   insulin aspart  0-6 Units Subcutaneous TID WC   levothyroxine  25 mcg Oral Q0600   linagliptin  2.5 mg Oral Daily   losartan  100 mg Oral Daily   mupirocin ointment   Topical TID   pantoprazole  40 mg Oral Daily   polyethylene glycol  17 g Oral BID   senna-docusate  1 tablet Oral BID   simvastatin  40 mg Oral QPM   Continuous Infusions:   LOS: 0 days   Kerney Elbe, DO [facility] PAGER is on AMION  If 7PM-7AM, please contact night-coverage [email or web address]

## 2019-04-23 NOTE — Evaluation (Signed)
Clinical/Bedside Swallow Evaluation Patient Details  Name: [name] MRN: [id number] Date of Birth: 04-Jun-1925  Today's Date: 04/23/2019 Time: SLP Start Time (ACUTE ONLY): 1101 SLP Stop Time (ACUTE ONLY): 1120 SLP Time Calculation (min) (ACUTE ONLY): 19 min  Past Medical History:  Past Medical History:  Diagnosis Date  . Acute MI, anterior wall ([place])    a. 07/07/2011  . CAD (coronary artery disease)    a. 07/07/2011 anterior MI- Occluded LAD w/ emergent CABG x 2 (SVG->LAD->D1).  . 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 Radiation initiated 09/2011  . Hemorrhoid   . History of radiation therapy 09/05/11-10/12/11   thyoma  . HTN (hypertension)   . Hypothyroidism   . Ischemic cardiomyopathy    a. 07/17/11 Echo: EF 35%;  b. 09/19/2011 Echo: EF 30-35%, Mid-Dist AntSept & Apical AK, mild MR.  Small residual VSD.  Marland Kitchen Osteoarthritis of knee    Bilateral.  a. s/p LTKA ~2007;  b. s/p RTKA 10/2010  . Seasonal allergies   . Shortness of breath    recently SOB  . VSD (ventricular septal defect)    a.  07/07/2011 - Noted @ time of MI - s/p repair @ time of CABG;  b. 09/19/2011 Echo: small residula VSD (present on 2/11 echo also)   Past Surgical History:  Past Surgical History:  Procedure Laterality Date  . ABDOMINAL HYSTERECTOMY    . CATARACT EXTRACTION, BILATERAL    . CORONARY ARTERY BYPASS GRAFT  07/07/2011   Procedure: CORONARY ARTERY BYPASS GRAFTING (CABG);  Surgeon: Tharon Aquas Adelene Idler, MD;  Location: [facility];  Service: Open Heart Surgery;  Laterality: N/A;  times two using greater saphenous vein harvested via endovascular vein harvest  . EYE SURGERY Bilateral    cataract  . HEMORRHOID SURGERY    . JOINT REPLACEMENT    . LEFT HEART CATHETERIZATION WITH CORONARY ANGIOGRAM N/A 07/07/2011   Procedure: LEFT HEART CATHETERIZATION WITH CORONARY ANGIOGRAM;  Surgeon: Josue Hector, MD;  Location: [facility] CATH LAB;  Service: Cardiovascular;  Laterality: N/A;  . LUMBAR  LAMINECTOMY/DECOMPRESSION MICRODISCECTOMY Left 08/19/2015   Procedure: Left Lumbar four-five Microdiskectomy;  Surgeon: Eustace Moore, MD;  Location: [place] NEURO ORS;  Service: Neurosurgery;  Laterality: Left;  left  . MASS EXCISION  07/07/2011   Procedure: EXCISION MASS;  Surgeon: Tharon Aquas Adelene Idler, MD;  Location: [place];  Service: Open Heart Surgery;  Laterality: N/A;  Excision of mediastinal mass  . MYRINGOTOMY WITH TUBE PLACEMENT Right 10/24/2012   Procedure: MYRINGOTOMY WITH TUBE PLACEMENT;  Surgeon: Melissa Montane, MD;  Location: [place];  Service: ENT;  Laterality: Right;  . NASAL ENDOSCOPY WITH EPISTAXIS CONTROL N/A 10/24/2012   Procedure: NASAL ENDOSCOPY WITH EPISTAXIS CONTROL;  Surgeon: Melissa Montane, MD;  Location: [place];  Service: ENT;  Laterality: N/A;  nasal endoscopy without epistaxis control... only need 30 minutes   . RIGHT HEART CATHETERIZATION  07/07/2011   Procedure: RIGHT HEART CATH;  Surgeon: Josue Hector, MD;  Location: [facility] CATH LAB;  Service: Cardiovascular;;  . TONSILLECTOMY    . TOTAL KNEE ARTHROPLASTY     a.  Left ~ 2007, Right 10/2010  . VSD REPAIR  07/07/2011   Procedure: VENTRICULAR SEPTAL DEFECT (VSD) REPAIR;  Surgeon: Len Childs, MD;  Location: [place];  Service: Open Heart Surgery;  Laterality: N/A;  with bovine pericardium 4cm x 4cm   HPI:  85 yo female with h/o HTN, hypothyroidism, diabetes, HLD, CAD  s/p CABG, lumbar radiculopathy s/p surgery 08/2015, overactive bladder admitted with left hip pain after slid down to the floor from chair.  Pt is s/p mediastinum thymoma cancer s/p thymoma resection with radiation in 10/12/11. Pt also with h/o COPD.  Swallow evaluation ordered.  RN reports pt has not demonstrated indication of dysphagia. Pt likely with hemangioma at C7 per prior imaging in 2001.   Assessment / Plan / Recommendation Clinical Impression  Patient presents with functional oropharyngeal swallow without indication of dysphagia nor aspiration with all po observed.  Pt  denies having dysphagia after radiation treatment for her thymoma - admits she has reflux issues for which she manages with diet/strategies, etc.  Pt takes very small boluses which helps to protect her airway and she reports if she does not, she will choke.  She denies weight loss - nor pneumonias.   Recommend regular/thin diet with general precautions.  Daughter and pt informed and agreeable to plan and have dealt with dysphagia with pt's spouse having h/o Parkinsons and dysphagia. All education completed, no follow up needed.  Marland Kitchen SLP Visit Diagnosis: Dysphagia, unspecified (R13.10)    Aspiration Risk  No limitations    Diet Recommendation Regular;Thin liquid   Liquid Administration via: Cup;Straw Medication Administration: Whole meds with liquid Supervision: Patient able to self feed Compensations: Minimize environmental distractions;Slow rate;Small sips/bites(start intake with liquids) Postural Changes: Seated upright at 90 degrees;Remain upright for at least 30 minutes after po intake    Other  Recommendations Oral Care Recommendations: Oral care BID   Follow up Recommendations None      Frequency and Duration     n/a       Prognosis    n/a    Swallow Study   General Date of Onset: 04/23/19 HPI: 86 yo female with h/o HTN, hypothyroidism, diabetes, HLD, CAD s/p CABG, lumbar radiculopathy s/p surgery 08/2015, overactive bladder admitted with left hip pain after slid down to the floor from chair.  Pt is s/p mediastinum thymoma cancer s/p thymoma resection with radiation in 10/12/11. Pt also with h/o COPD.  Swallow evaluation ordered.  RN reports pt has not demonstrated indication of dysphagia. Pt likely with hemangioma at C7 per prior imaging in 2001. Type of Study: Bedside Swallow Evaluation Diet Prior to this Study: Regular;Thin liquids Temperature Spikes Noted: No Respiratory Status: Room air History of Recent Intubation: No Behavior/Cognition: Alert;Cooperative;Pleasant  mood Oral Cavity Assessment: Within Functional Limits Oral Care Completed by SLP: No Oral Cavity - Dentition: Adequate natural dentition Vision: Functional for self-feeding Self-Feeding Abilities: Able to feed self Patient Positioning: Upright in bed Baseline Vocal Quality: Normal Volitional Cough: Strong Volitional Swallow: Able to elicit    Oral/Motor/Sensory Function Overall Oral Motor/Sensory Function: Within functional limits   Ice Chips Ice chips: Not tested   Thin Liquid Thin Liquid: Within functional limits Presentation: Straw Other Comments: Pt did not pass 3 ounce [place] screen as she required respiratory break.  No indication of airway compromise with po intake.    Nectar Thick Nectar Thick Liquid: Not tested   Honey Thick Honey Thick Liquid: Not tested   Puree Puree: Not tested   Solid     Solid: Within functional limits Presentation: Self Fed Other Comments: Pt takes very small boluses - states she does this to keep her from "choking".      Macario Golds 04/23/2019,11:43 AM   Luanna Salk, MS [facility] SLP Acute Rehab Services Pager [phone] Office [phone]

## 2019-04-23 NOTE — Evaluation (Signed)
Occupational Therapy Evaluation Patient Details Name: [name] MRN: [id number] DOB: 05/06/1925 Today's Date: 04/23/2019    History of Present Illness 85 y.o. female with medical history significant of hypertension, hypothyroidism, diabetes, hyperlipidemia, coronary disease/status post CABG, VSD status post patch repair, lumbar radiculopathy s/p surgery 08/2015, and overactive bladder who presents with left hip pain.   Clinical Impression   Pt admitted with fall and L hip pain. Pt currently with functional limitations due to the deficits listed below (see OT Problem List).  Pt will benefit from skilled OT to increase their safety and independence with ADL and functional mobility for ADL to facilitate discharge to venue listed below.    Pt is functioning well enough to DC home with 24* assistance, pt's daughter stated she can provide this. They have needed DME, including a RW and WC, but request PTAR transport home due to having 7 steps to enter     Follow Up Recommendations  Home health OT;Supervision/Assistance - 24 hour    Equipment Recommendations  3 in 1 bedside commode;Hospital bed       Precautions / Restrictions Precautions Precautions: Fall Precaution Comments: pt has had several falls this year Restrictions Weight Bearing Restrictions: No Other Position/Activity Restrictions: WBAT      Mobility Bed Mobility Overal bed mobility: Needs Assistance Bed Mobility: Supine to Sit     Supine to sit: Max assist;+2 for physical assistance     General bed mobility comments: pt in chair  Transfers Overall transfer level: Needs assistance Equipment used: Rolling walker (2 wheeled) Transfers: Sit to/from [employer] Transfers Sit to Stand: From elevated surface;Mod assist;Min assist Stand pivot transfers: Min assist       General transfer comment: VCs hand placement, assist to rise    Balance Overall balance assessment: Needs assistance Sitting-balance  support: Feet supported Sitting balance-Leahy Scale: Fair     Standing balance support: Bilateral upper extremity supported Standing balance-Leahy Scale: Poor Standing balance comment: BUE support required                           ADL either performed or assessed with clinical judgement   ADL Overall ADL's : Needs assistance/impaired Eating/Feeding: Set up;Sitting   Grooming: Minimal assistance;Sitting;Brushing hair   Upper Body Bathing: Minimal assistance;Sitting   Lower Body Bathing: Maximal assistance;Sit to/from stand;Cueing for safety;Cueing for compensatory techniques;Cueing for sequencing   Upper Body Dressing : Minimal assistance;Sitting   Lower Body Dressing: Maximal assistance;Sit to/from stand;Cueing for compensatory techniques;Cueing for safety                 General ADL Comments: family will A pt at home as needed.     Vision Patient Visual Report: No change from baseline              Pertinent Vitals/Pain Pain Assessment: No/denies pain Pain Score: 1  Faces Pain Scale: Hurts a little bit Pain Location: L groin with walking Pain Descriptors / Indicators: Sore Pain Intervention(s): RN gave pain meds during session;Limited activity within patient's tolerance;Monitored during session     Hand Dominance     Extremity/Trunk Assessment Upper Extremity Assessment Upper Extremity Assessment: Overall WFL for tasks assessed   Lower Extremity Assessment Lower Extremity Assessment: LLE deficits/detail LLE Deficits / Details: hip flexion AAROM ~35* supine, limited by pain, hip strength ~ -3/5, L knee ext 3/5 LLE: Unable to fully assess due to pain LLE Sensation: WNL LLE Coordination: WNL   Cervical /  Trunk Assessment Cervical / Trunk Assessment: Normal   Communication Communication Communication: HOH   Cognition Arousal/Alertness: Awake/alert Behavior During Therapy: WFL for tasks assessed/performed Overall Cognitive Status: Within  Functional Limits for tasks assessed                                                Home Living Family/patient expects to be discharged to:: Private residence Living Arrangements: Alone Available Help at Discharge: Family;Available PRN/intermittently Type of Home: House Home Access: Ramped entrance     Home Layout: One level     Bathroom Shower/Tub: Occupational psychologist: Standard     Home Equipment: Environmental consultant - 2 wheels;Walker - 4 wheels;Shower seat;Wheelchair - manual          Prior Functioning/Environment Level of Independence: Needs assistance  Gait / Transfers Assistance Needed: walks with RW or rollator ADL's / Homemaking Assistance Needed: supervision Communication / Swallowing Assistance Needed: HOH Comments: daughter provides supervision for bathing/dressing        OT Problem List: Decreased strength;Impaired balance (sitting and/or standing);Decreased safety awareness      OT Treatment/Interventions: Self-care/ADL training;Patient/family education;Therapeutic activities;DME and/or AE instruction    OT Goals(Current goals can be found in the care plan section) Acute Rehab OT Goals Patient Stated Goal: return to living independently OT Goal Formulation: With patient Time For Goal Achievement: 04/30/19 ADL Goals Pt Will Perform Lower Body Dressing: with min guard assist;sit to/from stand Pt Will Transfer to Toilet: with supervision;bedside commode;ambulating;stand pivot transfer Pt Will Perform Toileting - Clothing Manipulation and hygiene: with min guard assist;sit to/from stand;sitting/lateral leans  OT Frequency: Min 2X/week              AM-PAC OT "6 Clicks" Daily Activity     Outcome Measure Help from another person eating meals?: None Help from another person taking care of personal grooming?: A Little Help from another person toileting, which includes using toliet, bedpan, or urinal?: A Lot Help from another person  bathing (including washing, rinsing, drying)?: A Lot Help from another person to put on and taking off regular upper body clothing?: A Little Help from another person to put on and taking off regular lower body clothing?: A Lot 6 Click Score: 16   End of Session Equipment Utilized During Treatment: Rolling walker;Gait belt Nurse Communication: Mobility status  Activity Tolerance: Patient tolerated treatment well Patient left: in chair;with call bell/phone within reach;with chair alarm set  OT Visit Diagnosis: Unsteadiness on feet (R26.81);Other abnormalities of gait and mobility (R26.89);Repeated falls (R29.6);Muscle weakness (generalized) (M62.81)                Time: [id number] OT Time Calculation (min): 21 min Charges:  OT General Charges $OT Visit: 1 Visit OT Evaluation $OT Eval Moderate Complexity: 1 Mod  Kari Baars, [place] [phone] Office- [phone], Edwena Felty D 04/23/2019, 12:59 PM

## 2019-04-23 NOTE — TOC Initial Note (Signed)
Transition of Care [facility]) - Initial/Assessment Note    Patient Details  Name: [name] MRN: [id number] Date of Birth: [birth year]  Transition of Care [facility]) CM/SW Contact:    Lia Hopping, [place] Phone Number: 04/23/2019, 4:45 PM  Clinical Narrative:                 CSW met with the patient and her daughter at bedside to discuss discharge planning. Patient declines skill nursing care and has requested to home health services. Patient reports prior to her fall at home she was independent with ADL's. Patient daughter provides supervision during bathing to make the patient does not fall. Daughter reports the patient is very active outdoors and around the house. . Daughter states, "We want her to be independent again." Daughter has requested the patient go home with a hospital bed for safety.  Hospital bed ordered through [facility] and will be delivered 11/19 to daughter home address..  CSW provided daughter with a list of Home Health agencies from Medicare. Gov[employer] [facility] confirm staffing availably to provide PT/OT services.     Expected Discharge Plan: [employer] Barriers to Discharge: Equipment Delay   Patient Goals and CMS Choice Patient states their goals for this hospitalization and ongoing recovery are:: "I want to walk without pain." CMS Medicare.gov Compare Post Acute Care list provided to:: Patient Choice offered to / list presented to : Adult Children  Expected Discharge Plan and Services Expected Discharge Plan: [place] In-house Referral: Clinical Social Work   Post Acute Care Choice: Durable Medical Equipment Living arrangements for the past 2 months: [place]                 DME Arranged: Hospital bed DME Agency: [facility] Date DME Agency Contacted: 04/23/19   Representative spoke with at DME Agency: [place]: PT, OT [place] Agency: [place] ([place]) Date [place]: 04/23/19 Time [place]: [phone] Representative spoke with at [place]: Santiago Glad  Prior Living Arrangements/Services Living arrangements for the past 2 months: [place] with:: Self Patient language and need for interpreter reviewed:: No        Need for Family Participation in Patient Care: Yes (Comment) Care giver support system in place?: Yes (comment) Current home services: DME Criminal Activity/Legal Involvement Pertinent to Current Situation/Hospitalization: No - Comment as needed  Activities of Daily Living Home Assistive Devices/Equipment: Cane (specify quad or straight), Walker (specify type), Eyeglasses, CBG Meter(4 wheeled walker, single point cane) ADL Screening (condition at time of admission) Patient's cognitive ability adequate to safely complete daily activities?: Yes Is the patient deaf or have difficulty hearing?: Yes(hoh) Does the patient have difficulty seeing, even when wearing glasses/contacts?: No Does the patient have difficulty concentrating, remembering, or making decisions?: Yes Patient able to express need for assistance with ADLs?: Yes Does the patient have difficulty dressing or bathing?: Yes Independently performs ADLs?: No Communication: Independent Dressing (OT): Needs assistance Is this a change from baseline?: Change from baseline, expected to last >3 days Grooming: Needs assistance Is this a change from baseline?: Change from baseline, expected to last >3 days Feeding: Independent Bathing: Needs assistance Is this a change from baseline?: Change from baseline, expected to last >3 days Toileting: Needs assistance Is this a change from baseline?: Change from baseline, expected to last >3days In/Out Bed: Needs assistance Is this a change from baseline?: Change from baseline, expected to  last >3 days Walks in Home: Needs assistance Is this a change from baseline?: Change from baseline, expected to last >3 days Does  the patient have difficulty walking or climbing stairs?: Yes(secondary to hip pain) Weakness of Legs: Both Weakness of Arms/Hands: None  Permission Sought/Granted Permission sought to share information with : Case Manager, Family Supports Permission granted to share information with : Yes, Verbal Permission Granted              Emotional Assessment Appearance:: Appears stated age   Affect (typically observed): Accepting Orientation: : Oriented to Self, Oriented to Place, Oriented to Situation Alcohol / Substance Use: Not Applicable Psych Involvement: No (comment)  Admission diagnosis:  Acute left-sided low back pain with left-sided sciatica [M54.42] Patient Active Problem List   Diagnosis Date Noted  . Hip pain 04/22/2019  . Pain in left knee 06/07/2017  . Acute non-infective otitis externa of right ear 10/31/2016  . Impacted cerumen of both ears 12/27/2015  . Marginal perforation of tympanic membrane of right ear 12/27/2015  . Sensorineural hearing loss (SNHL), bilateral 12/27/2015  . S/P lumbar laminectomy 08/19/2015  . Ischemic cardiomyopathy   . CHF (congestive heart failure) ([place]) 08/18/2011  . Pleural effusion 08/18/2011  . Cellulitis 08/18/2011  . Dyspnea 08/17/2011  . Malignant neoplasm of anterior mediastinum ([place]) 08/07/2011  . Physical deconditioning 07/19/2011  . Cancer ([place]) 07/08/2011  . Acute anterior wall MI ([place]) 07/07/2011  . Myocardial infarction ([place]) 07/07/2011  . HTN (hypertension)   . Osteoarthritis of knee   . CAD (coronary artery disease)   . VSD (ventricular septal defect)    PCP:  Scheryl Marten, PA Pharmacy:   [facility] [address] [place] [address] Phone: [phone] Fax: [phone]  [facility], [place] [address] Phone: [phone] Fax:  [phone]     Social Determinants of Health (SDOH) Interventions    Readmission Risk Interventions No flowsheet data found.

## 2019-04-24 DIAGNOSIS — R52 Pain, unspecified: Secondary | ICD-10-CM | POA: Diagnosis not present

## 2019-04-24 DIAGNOSIS — Z7401 Bed confinement status: Secondary | ICD-10-CM | POA: Diagnosis not present

## 2019-04-24 DIAGNOSIS — M5489 Other dorsalgia: Secondary | ICD-10-CM | POA: Diagnosis not present

## 2019-04-24 DIAGNOSIS — M545 Low back pain: Secondary | ICD-10-CM | POA: Diagnosis not present

## 2019-04-24 DIAGNOSIS — M2550 Pain in unspecified joint: Secondary | ICD-10-CM | POA: Diagnosis not present

## 2019-04-24 DIAGNOSIS — N183 Chronic kidney disease, stage 3 unspecified: Secondary | ICD-10-CM | POA: Diagnosis not present

## 2019-04-24 DIAGNOSIS — I5022 Chronic systolic (congestive) heart failure: Secondary | ICD-10-CM | POA: Diagnosis not present

## 2019-04-24 DIAGNOSIS — D649 Anemia, unspecified: Secondary | ICD-10-CM | POA: Diagnosis not present

## 2019-04-24 DIAGNOSIS — M25559 Pain in unspecified hip: Secondary | ICD-10-CM | POA: Diagnosis not present

## 2019-04-24 DIAGNOSIS — E1122 Type 2 diabetes mellitus with diabetic chronic kidney disease: Secondary | ICD-10-CM | POA: Diagnosis not present

## 2019-04-24 LAB — COMPREHENSIVE METABOLIC PANEL
ALT: 15 U/L (ref 0–44)
AST: 19 U/L (ref 15–41)
Albumin: 3.2 g/dL — ABNORMAL LOW (ref 3.5–5.0)
Alkaline Phosphatase: 55 U/L (ref 38–126)
Anion gap: 11 (ref 5–15)
BUN: 28 mg/dL — ABNORMAL HIGH (ref 8–23)
CO2: 18 mmol/L — ABNORMAL LOW (ref 22–32)
Calcium: 8.8 mg/dL — ABNORMAL LOW (ref 8.9–10.3)
Chloride: 104 mmol/L (ref 98–111)
Creatinine, Ser: 1.42 mg/dL — ABNORMAL HIGH (ref 0.44–1.00)
GFR calc Af Amer: 37 mL/min — ABNORMAL LOW (ref >60)
GFR calc non Af Amer: 32 mL/min — ABNORMAL LOW (ref >60)
Glucose, Bld: 153 mg/dL — ABNORMAL HIGH (ref 70–99)
Potassium: 3.9 mmol/L (ref 3.5–5.1)
Sodium: 133 mmol/L — ABNORMAL LOW (ref 135–145)
Total Bilirubin: 0.9 mg/dL (ref 0.3–1.2)
Total Protein: 6.1 g/dL — ABNORMAL LOW (ref 6.5–8.1)

## 2019-04-24 LAB — FOLATE: Folate: 20.7 ng/mL (ref >5.9)

## 2019-04-24 LAB — CBC WITH DIFFERENTIAL/PLATELET
Abs Immature Granulocytes: 0.03 10*3/uL (ref 0.00–0.07)
Basophils Absolute: 0.1 10*3/uL (ref 0.0–0.1)
Basophils Relative: 1 %
Eosinophils Absolute: 0.3 10*3/uL (ref 0.0–0.5)
Eosinophils Relative: 3 %
HCT: 34.7 % — ABNORMAL LOW (ref 36.0–46.0)
Hemoglobin: 11.1 g/dL — ABNORMAL LOW (ref 12.0–15.0)
Immature Granulocytes: 0 %
Lymphocytes Relative: 9 %
Lymphs Abs: 0.9 10*3/uL (ref 0.7–4.0)
MCH: 28.2 pg (ref 26.0–34.0)
MCHC: 32 g/dL (ref 30.0–36.0)
MCV: 88.3 fL (ref 80.0–100.0)
Monocytes Absolute: 1.1 10*3/uL — ABNORMAL HIGH (ref 0.1–1.0)
Monocytes Relative: 11 %
Neutro Abs: 7.7 10*3/uL (ref 1.7–7.7)
Neutrophils Relative %: 76 %
Platelets: 233 10*3/uL (ref 150–400)
RBC: 3.93 MIL/uL (ref 3.87–5.11)
RDW: 14.2 % (ref 11.5–15.5)
WBC: 10.2 10*3/uL (ref 4.0–10.5)
nRBC: 0 % (ref 0.0–0.2)

## 2019-04-24 LAB — RETICULOCYTES
Immature Retic Fract: 12.7 % (ref 2.3–15.9)
RBC.: 3.93 MIL/uL (ref 3.87–5.11)
Retic Count, Absolute: 45.2 10*3/uL (ref 19.0–186.0)
Retic Ct Pct: 1.2 % (ref 0.4–3.1)

## 2019-04-24 LAB — IRON AND TIBC
Iron: 24 ug/dL — ABNORMAL LOW (ref 28–170)
Saturation Ratios: 7 % — ABNORMAL LOW (ref 10.4–31.8)
TIBC: 337 ug/dL (ref 250–450)
UIBC: 313 ug/dL

## 2019-04-24 LAB — GLUCOSE, CAPILLARY
Glucose-Capillary: 134 mg/dL — ABNORMAL HIGH (ref 70–99)
Glucose-Capillary: 149 mg/dL — ABNORMAL HIGH (ref 70–99)

## 2019-04-24 LAB — FERRITIN: Ferritin: 35 ng/mL (ref 11–307)

## 2019-04-24 LAB — MAGNESIUM: Magnesium: 1.9 mg/dL (ref 1.7–2.4)

## 2019-04-24 LAB — VITAMIN B12: Vitamin B-12: 790 pg/mL (ref 180–914)

## 2019-04-24 LAB — PHOSPHORUS: Phosphorus: 2.8 mg/dL (ref 2.5–4.6)

## 2019-04-24 MED ORDER — POLYSACCHARIDE IRON COMPLEX 150 MG PO CAPS: 150.0000 mg | ORAL_CAPSULE | Freq: Every day | ORAL | 0 refills | Status: AC

## 2019-04-24 MED ORDER — OXYCODONE HCL 5 MG PO TABS: 5.0000 mg | ORAL_TABLET | ORAL | 0 refills | Status: DC | PRN

## 2019-04-24 MED ORDER — POLYSACCHARIDE IRON COMPLEX 150 MG PO CAPS: 150.0000 mg | ORAL_CAPSULE | Freq: Every day | ORAL | 0 refills | Status: DC

## 2019-04-24 MED ORDER — ACETAMINOPHEN 325 MG PO TABS: 650.0000 mg | ORAL_TABLET | Freq: Four times a day (QID) | ORAL | 0 refills | Status: AC | PRN

## 2019-04-24 MED ORDER — SENNOSIDES-DOCUSATE SODIUM 8.6-50 MG PO TABS: 1.0000 | ORAL_TABLET | Freq: Every day | ORAL | 0 refills | Status: AC

## 2019-04-24 MED ORDER — POLYETHYLENE GLYCOL 3350 17 G PO PACK: 17.0000 g | PACK | Freq: Every day | ORAL | 0 refills | Status: AC

## 2019-04-24 MED ORDER — ONDANSETRON HCL 4 MG PO TABS: 4.0000 mg | ORAL_TABLET | Freq: Four times a day (QID) | ORAL | 0 refills | Status: AC | PRN

## 2019-04-24 MED ORDER — SODIUM BICARBONATE 650 MG PO TABS
650.0000 mg | ORAL_TABLET | Freq: Three times a day (TID) | ORAL | Status: DC
  Administered 2019-04-24: 650 mg via ORAL
  Filled 2019-04-24 (×3): qty 1

## 2019-04-24 MED ORDER — ACETAMINOPHEN 325 MG PO TABS: 650.0000 mg | ORAL_TABLET | Freq: Four times a day (QID) | ORAL | 0 refills | Status: DC | PRN

## 2019-04-24 MED ORDER — ONDANSETRON HCL 4 MG PO TABS: 4.0000 mg | ORAL_TABLET | Freq: Four times a day (QID) | ORAL | 0 refills | Status: DC | PRN

## 2019-04-24 MED ORDER — POLYETHYLENE GLYCOL 3350 17 G PO PACK: 17.0000 g | PACK | Freq: Every day | ORAL | 0 refills | Status: DC

## 2019-04-24 MED ORDER — POLYSACCHARIDE IRON COMPLEX 150 MG PO CAPS: 150.0000 mg | ORAL_CAPSULE | Freq: Every day | ORAL | Status: DC

## 2019-04-24 MED ORDER — SENNOSIDES-DOCUSATE SODIUM 8.6-50 MG PO TABS: 1.0000 | ORAL_TABLET | Freq: Every day | ORAL | 0 refills | Status: DC

## 2019-04-24 NOTE — Progress Notes (Signed)
Physical Therapy Evaluation Patient Details Name: [name] MRN: [id number] DOB: [birth year] Today's Date: 04/24/2019   History of Present Illness  85 y.o. female with medical history significant of hypertension, hypothyroidism, diabetes, hyperlipidemia, coronary disease/status post CABG, VSD status post patch repair, lumbar radiculopathy s/p surgery 08/2015, and overactive bladder who presents with left hip pain.    Clinical Impression  Pt demonstrated great motivation during today's session. Pt daughter was present for treatment. Daughter helped pt from the [employer] [facility] [facility] General bed mobility comments: Pt was able to use gait belt to move L LE off the bed. Pt requried VC's for safety and min assist to scoot to the EOB General transfer comment: VC's required to push up from the bed/armrests and not pull on the walker Pt required CGA for steadying once standing General Gait Details: Pt was able to ambulate 53ft on a RW. Daughter was educated on how to guard pt when walking. Pt required mod VC's for sequencing (walker, L LE, R LE) and to stand up wall while ambulating. Pt and daughter were educated on how to use the [facility] and best way to help the pt get dressed. All questions were asked and answered.    Follow Up Recommendations Home health PT;Supervision/Assistance - 24 hour    Equipment Recommendations  None recommended by PT    Recommendations for Other Services       Precautions / Restrictions Precautions Precautions: Fall Precaution Comments: pt has had several falls this year Restrictions Weight Bearing Restrictions: No Other Position/Activity Restrictions: WBAT      Mobility  Bed Mobility Overal bed mobility: Needs Assistance Bed Mobility: Supine to Sit     Supine to sit: Min assist     General bed mobility comments: Pt was able to use gait belt to move L LE off the bed. Pt requried VC's for safety and min assist to scoot to the EOB  Transfers Overall transfer level: Needs  assistance Equipment used: Rolling walker (2 wheeled) Transfers: Sit to/from Stand Sit to Stand: Min guard Stand pivot transfers: Min guard       General transfer comment: VC's required to push up from the bed/armrests and not pull on the walker Pt required CGA for steadying once standing  Ambulation/Gait Ambulation/Gait assistance: Min guard Gait Distance (Feet): 50 Feet Assistive device: Rolling walker (2 wheeled) Gait Pattern/deviations: Decreased stride length;Step-through pattern;Decreased step length - right;Decreased step length - left;Trunk flexed     General Gait Details: Pt was able to ambulate 59ft on a RW. Pt required mod VC's for sequencing (walker, L LE, R LE) and to stand up wall while ambulating  Stairs            Wheelchair Mobility    Modified Rankin (Stroke Patients Only)       Balance                                             Pertinent Vitals/Pain Pain Assessment: 0-10 Pain Score: 10-Worst pain ever Pain Location: L groin with walking Pain Descriptors / Indicators: Aching;Sore;Grimacing;Discomfort Pain Intervention(s): Monitored during session;Repositioned;Ice applied    Home Living                        Prior Function                 Hand Dominance  Extremity/Trunk Assessment                Communication      Cognition Arousal/Alertness: Awake/alert Behavior During Therapy: WFL for tasks assessed/performed Overall Cognitive Status: Within Functional Limits for tasks assessed                                        General Comments      Exercises     Assessment/Plan    PT Assessment    PT Problem List         PT Treatment Interventions      PT Goals (Current goals can be found in the Care Plan section)       Frequency Min 3X/week   Barriers to discharge        Co-evaluation               AM-PAC PT "6 Clicks" Mobility  Outcome Measure  Help needed turning from your back to your side while in a flat bed without using bedrails?: A Lot Help needed moving from lying on your back to sitting on the side of a flat bed without using bedrails?: A Lot Help needed moving to and from a bed to a chair (including a wheelchair)?: A Little Help needed standing up from a chair using your arms (e.g., wheelchair or bedside chair)?: A Little Help needed to walk in hospital room?: A Little Help needed climbing 3-5 steps with a railing? : A Lot 6 Click Score: 15    End of Session Equipment Utilized During Treatment: Gait belt Activity Tolerance: Patient tolerated treatment well Patient left: in chair;with call bell/phone within reach;with family/visitor present Nurse Communication: Mobility status;Patient requests pain meds PT Visit Diagnosis: Difficulty in walking, not elsewhere classified (R26.2);Pain;History of falling (Z91.81)    Time: 1202-1230 PT Time Calculation (min) (ACUTE ONLY): 28 min   Charges:     PT Treatments $Gait Training: 8-22 mins $Self Care/Home Management: [place]

## 2019-04-24 NOTE — Plan of Care (Signed)
  Problem: Education: Goal: Knowledge of General Education information will improve Description: Including pain rating scale, medication(s)/side effects and non-pharmacologic comfort measures Outcome: Progressing   Problem: Clinical Measurements: Goal: Will remain free from infection Outcome: Progressing Goal: Respiratory complications will improve Outcome: Progressing Goal: Cardiovascular complication will be avoided Outcome: Progressing   Problem: Activity: Goal: Risk for activity intolerance will decrease Outcome: Progressing   Problem: Elimination: Goal: Will not experience complications related to bowel motility Outcome: Progressing Goal: Will not experience complications related to urinary retention Outcome: Progressing   Problem: Pain Managment: Goal: General experience of comfort will improve Outcome: Progressing   Problem: Safety: Goal: Ability to remain free from injury will improve Outcome: Progressing

## 2019-04-24 NOTE — Discharge Summary (Signed)
Physician Discharge Summary  KAREN SARRATT [id number] DOB: [birth year] DOA: 04/22/2019  PCP: Scheryl Marten, PA  Admit date: 04/22/2019 Discharge date: 04/24/2019  Admitted From: Home  Disposition: Home with [facility] PT/OT as she is against SNF  Recommendations for Outpatient Follow-up:  1. Follow up with PCP in 1-2 weeks 2. Follow up with Orthopedic Surgery Dr. Alvan Dame in 2-3 weeks 3. Follow up with Neurosurgery Dr. Ronnald Ramp in 1-2 weeks and repeat Back injections  4. Please obtain CMP/CBC, Mag, Phos in one week to evaluate Sodium, Renal Function and Metabolic Acidosis  5. Please follow up on the following pending results:  Home Health: Yes  Equipment/Devices: Hospital Bed, 3in1   Discharge Condition: Stable CODE STATUS: DO NOT RESUSCITATE  Diet recommendation: Heart Healthy Diet   Brief/Interim Summary: HPI per Dr. Truddie Hidden on 04/22/2019 [name] a 85 y.o.femalewith medical history significant ofhypertension, hypothyroidism, diabetes, hyperlipidemia, coronary disease/status post CABG, VSD status post patch repair, lumbar radiculopathys/p surgery 08/2015,and overactive bladder who presents with left hip pain.  Patient accompanied by her daughter who assists with history. Patient was in usual state of health through this past Friday. She lives independently and has routine visits from her daughter. Friday night patient fell asleep in her recliner not in her usual chair. She states around 1 AM, early Saturday morning she slowly slid down on her recliner and fell to the floor. She states it was not very traumatic but she landed on her bottom and she was unable to get up. She did not hit her head. Recalls the entire event. She denies any loss of consciousness. She called EMS and they were able to lift her back up and she was able to stand and walk around with her walker. She states after this she eventually did fall asleep in her regular chair.  Her  daughter visited her the next day and they went shopping on both Saturday and Sunday. The patient was able to ambulate without notable discomfort. The daughter reports that she did not know about the event on Friday night until this morning. So if there were new changes she was not closely pay attention. She did state she noticed that she had to assist her mom getting in and out of the car and had to lift her left leg up in and out. The patient does states she did not really notice much of a change over the weekend nor on Monday. However Monday night after going to bed in her chair she reports that she was having severe left hip pain that would make her cry out every 15 minutes. She reports the pain was so severe she did not sleep throughout the night. Her daughter reports she had gone to her dentist appointment and went to check in on her mom soon after. And while she was there she notes that her mom was in much more pain and cannot ambulate except for once to the bathroom and back. She is followed closely behind her and noticed that she was dragging her left foot. She states they were supposed to see her PCP this afternoon but when she was seated in her chair she almost slid out again so they decided to come in by EMS.  The patient states the pain is mostly located in the left hip. She states it hurts when she presses on it. She does not really describe a shooting pain down her leg. She states though if she were to put pressure on her  foot she feels pain in her hip. Besides sliding out of the chair she denies any awkward falls or trauma.  Otherwise she has been in her usual state of health. She has been taking her medications daily without issue. She denies any fevers, chills, shortness of breath, cough. She denies any nausea, vomiting. She has a good appetite. She does report that she does have poor sleep mostly because she has to wake up in the night to urinate. She takes an afternoon  nap.  She is a non-smoker, no alcohol use, no drug use. Her daughter states she lives in a house filled with smoke however so she does have an occasional cough but nothing unusual.  **Interim History  Orthopedic surgery and neurosurgery were called discussed the case and they recommend nonoperative measurements.  PT OT recommending home health and unfortunately unable to obtain a hospital bed yesterday but will be delivered today.  Patient did have a small metabolic acidosis but she is stable and not complaining of any pain now.  She will need to follow-up with PCP, neurosurgery, orthopedic surgery within the coming weeks as she is stable for discharge and will go home with home health PT and OT as she has refused SNF.  Discharge Diagnoses:  Active Problems:   Hip pain  L. Hip pain in the setting of Torn Distal Iliopsoas Tendon and Chronic partial tearing of the Left Gluteus Medicas and Minuimus Tendons Distally and the Greater Trochanter, improved  -Likely in the setting mild trauma reported three days prior to severe onset of pain, notably brought upon by dorsiflexion of L. Foot and palpation over joint.  -Obtained MRI to assist in diagnosis, plain films unrevealing of acute pathology -MRI showed "Torn but not ruptured distal iliopsoas tendon, with edema in the iliopsoas musculature and in the adjacent hip adductor musculature. Suspected chronic partial tearing of the left gluteus medius and minimus tendons distally and the greater trochanter. Mild trochanteric bursitis. Mild to moderate degenerative chondral thinning in both hips. Considerable lumbar spondylosis and degenerative disc disease. Sigmoid colon diverticulosis.  2.6 by 1.0 cm lesion in the lower femoral neck, probably Enchondroma." -Continue pain control with Acteaminophen 650 mg po q6hprn Mild Pain, Hydrocodone-Acetaminophen 1-2 tab po q6hprn Moderate Pain/Severe Pain, and will discontinue Naproxen 250 mg po BIDprn -Given IV  Morphine 4 mg x1 this hospitalization  -Continue Ca-Vitamin D 500-200 mg 1 po Daily along with Cholecalciferol 1,000 units po Daily   -I spoke with Dr. Alvan Dame of Ortho who recommends weightbearing as tolerated along with physical therapy and or occupational therapy and recommends no surgical intervention necessary at this point -PT/OT recommending Home Health PT and OT and patient's family requesting a hospital bed and unfortunately could not be delivered yesterday likely will be delivered today; Will also write for 3in1 -Continue bowel regimen given the patient's constipation and have started senna docusate 1 tab p.o. twice daily, MiraLAX 17 g p.o. twice daily, and have a bisacodyl suppository 10 mg daily as needed for moderate constipation; Patient had a Bowel Movement last night  -Patient not in any pain; Stable to D/C home with PT/OT   Back Pain -MRI done and showed "Progressive multilevel lumbar spondylosis most pronounced at the L2-3 level where there is now severe canal stenosis and severe left foraminal stenosis. Mild canal stenosis at the L1-2 level has also progressed from Prior. Previously seen inferior extension of a left subarticular disc extrusion at L4-5 has largely resolved. Right worse than left foraminal and subarticular  recess stenosis at L4-5. Stable grade 1 anterolisthesis L5 on S1 secondary to chronic bilateral pars defects" -I spoke with patient's primary neurosurgeon Dr. Sherley Bounds who recommends conservative therapy at this point and recommends PT and OT -Dr. Ronnald Ramp recommends an outpatient steroid injection if possible given that he wants to be as conservative as possible and follow-up with him within 1 to 2 weeks  Constipation, improved  -Continued bowel regimen given the patient's constipation and have started senna docusate 1 tab p.o. twice daily, MiraLAX 17 g p.o. twice daily, and have a bisacodyl suppository 10 mg daily as needed for moderate constipation -If necessary  patient will need an enema but this was not the case -Will write for Bowel Regimen at D/C  ICMP/CAD VSD s/p Repair Chronic Systolic Heart Failure and history of ischemic cardiomyopathy with an anterior wall MI in the past -No symptoms of angina, no orthopnea (thought patient sleeps chronically in chair for > 25 years) and no recent Echo, prior EF 30-35% in 2015 -Appears euvolemic on examination -Patient follows with Cardiology, Dr. Johnsie Cancel (most recently 04/14/2019) -Continue Carvedilol 6.25 mg po BID, Losartan 100 mg po daily and Furosemide 20 mg p.o. daily -Continue Simvastatin 40 mg po qHS -Strict I's and O's and daily weights; She is +380 mL since admission but ? Accuracy  -Continue to monitor for signs and symptoms of volume overload  HTN -Continue Carvedilol 6.25 mg po BID, Losartan 100 mg po daily and Furosemide 20 mg p.o. daily  Type 2 Diabetes Mellitus Type 2 -Continue to hold Metformin while Hospitalized, continued Linagliptin 2.5 mg po Daily  -HbA1c was 6.4 -CBG's ranging from 128-226; blood sugar on BMP this morning was 153 -Continue with ISS and accuchecks  HLD -Continue Simvastatin 40 mg po qHS  Hypothyroidism -Check TSH in the outpatient  Setting  -Continue Levothyroxine 25 mcg po Daily   CKD stage III Metabolic Acidosis -Appears at baseline -Patient BUN/creatinine went from 34/1.41-> 30/1.38 -> 28/1.42 -Patient's CO2 is now 18, chloride is 104, and anion gap is 11 -In the setting of Lasix  -Given Sodium Bicarbonate 650 mg po TID today  -Continue to monitor and trend and avoid nephrotoxic medications, contrast dyes, hypotension and renally adjust medications  -Repeat CMP within 1 week   Normocytic Anemia -Anemia of Chronic Kidney Disease -Patient's hemoglobin/hematocrit went from 11.0/35.4 -> 10.9/34.6 -> 11.1/34.7 -Checked Anemia Panel and showed an iron level of 24, U IBC 313, TIBC of 337, saturation ratios of 7%, ferritin level 35, folate level 20.7,  and vitamin B12 level 790 -Start Iron Polysaccharides 150 mg po Daily  -Continue to monitor for signs and symptoms of bleeding; currently no overt bleeding noted -Repeat CBC in a.m.  Hyponatremia -Patient's Na+ was 133 -In the setting of Lasix -Continue to Monitor and Trend in the outpatient setting -Repeat CMP within 1 week  Discharge Instructions  Discharge Instructions    Call MD for:  difficulty breathing, headache or visual disturbances   Complete by: As directed    Call MD for:  extreme fatigue   Complete by: As directed    Call MD for:  hives   Complete by: As directed    Call MD for:  persistant dizziness or light-headedness   Complete by: As directed    Call MD for:  persistant nausea and vomiting   Complete by: As directed    Call MD for:  redness, tenderness, or signs of infection (pain, swelling, redness, odor or green/yellow discharge around incision site)  Complete by: As directed    Call MD for:  severe uncontrolled pain   Complete by: As directed    Call MD for:  temperature >100.4   Complete by: As directed    Diet - low sodium heart healthy   Complete by: As directed    Discharge instructions   Complete by: As directed    You were cared for by a hospitalist during your hospital stay. If you have any questions about your discharge medications or the care you received while you were in the hospital after you are discharged, you can call the unit and ask to speak with the hospitalist on call if the hospitalist that took care of you is not available. Once you are discharged, your primary care physician will handle any further medical issues. Please note that NO REFILLS for any discharge medications will be authorized once you are discharged, as it is imperative that you return to your primary care physician (or establish a relationship with a primary care physician if you do not have one) for your aftercare needs so that they can reassess your need for medications and  monitor your lab values.  Follow up with PCP, Neurosurgery, and Orthopedic Surgery. Take all medications as prescribed. If symptoms change or worsen please return to the ED for evaluation   Increase activity slowly   Complete by: As directed    WBAT on Left Extremity     Allergies as of 04/24/2019      Reactions   Amoxapine And Related Hives   Clozapine Hives      Medication List    STOP taking these medications   HYDROcodone-acetaminophen 5-325 MG tablet Commonly known as: NORCO/VICODIN   naproxen sodium 220 MG tablet Commonly known as: ALEVE     TAKE these medications   acetaminophen 325 MG tablet Commonly known as: TYLENOL Take 2 tablets (650 mg total) by mouth every 6 (six) hours as needed for mild pain (or Fever >/= 101).   Advil PM 200-25 MG Caps Generic drug: Ibuprofen-diphenhydrAMINE HCl Take 1 tablet by mouth at bedtime. sleep   aspirin EC 81 MG tablet Take 81 mg by mouth daily.   Calcium 500-100 MG-UNIT Chew Chew by mouth.   carvedilol 6.25 MG tablet Commonly known as: COREG Take 1 tablet (6.25 mg total) by mouth 2 (two) times daily with a meal.   cholecalciferol 1000 units tablet Commonly known as: VITAMIN D Take 1,000 Units by mouth daily.   furosemide 20 MG tablet Commonly known as: LASIX Take 20 mg by mouth daily.   iron polysaccharides 150 MG capsule Commonly known as: NIFEREX Take 1 capsule (150 mg total) by mouth daily.   levothyroxine 50 MCG tablet Commonly known as: SYNTHROID Take 25 mcg by mouth daily before breakfast.   linagliptin 5 MG Tabs tablet Commonly known as: TRADJENTA Take 2.5 mg by mouth daily.   losartan 100 MG tablet Commonly known as: COZAAR Take 100 mg by mouth daily.   metFORMIN 500 MG tablet Commonly known as: GLUCOPHAGE Take 500 mg by mouth 2 (two) times daily with a meal.   omeprazole 20 MG capsule Commonly known as: PRILOSEC Take 20 mg by mouth daily.   ondansetron 4 MG tablet Commonly known as:  ZOFRAN Take 1 tablet (4 mg total) by mouth every 6 (six) hours as needed for nausea.   oxyCODONE 5 MG immediate release tablet Commonly known as: Oxy IR/ROXICODONE Take 1 tablet (5 mg total) by mouth every 4 (four)  hours as needed for moderate pain.   Pneumovax 23 25 MCG/0.5ML injection Generic drug: pneumococcal 23 valent vaccine Pneumovax-23 25 mcg/0.5 mL injection syringe  INJECT 0.5ML INTRAMUSCULARLY ONCE   polyethylene glycol 17 g packet Commonly known as: MIRALAX / GLYCOLAX Take 17 g by mouth daily.   senna-docusate 8.6-50 MG tablet Commonly known as: Senokot-S Take 1 tablet by mouth at bedtime.   simvastatin 40 MG tablet Commonly known as: ZOCOR Take 40 mg by mouth every evening.            Durable Medical Equipment  (From admission, onward)         Start     Ordered   04/24/19 1022  For home use only DME 3 n 1  Once     04/24/19 1021   04/23/19 1306  For home use only DME Hospital bed  Once    Question Answer Comment  Length of Need 6 Months   The above medical condition requires: Patient requires the ability to reposition frequently   Bed type Semi-electric   Support Surface: Gel Overlay      04/23/19 1305          Allergies  Allergen Reactions  . Amoxapine And Related Hives  . Clozapine Hives   Consultations:  Case was discussed with Neurosurgery Dr. Ronnald Ramp  Case was discussed with Orthopedic Surgery Dr. Alvan Dame   Procedures/Studies: Dg Lumbar Spine Complete  Result Date: 04/22/2019 CLINICAL DATA:  Recent fall.  Left hip pain.  Weakness. EXAM: LUMBAR SPINE - COMPLETE 4+ VIEW COMPARISON:  Lateral lumbar spine in the OR 08/19/2015. CT lumbar spine 03/25/2015 FINDINGS: Negative for acute fracture. Mild depression superior endplate of 075-GRM appear chronic. Multilevel disc and facet degeneration. Progressive grade 1 anterolisthesis L4-5 compatible with disc and facet degeneration. Atherosclerotic aorta IMPRESSION: Lumbar spine degenerative change.  Progressive degenerative anterolisthesis at L4-5 Negative for acute fracture. Electronically Signed   By: Franchot Gallo M.D.   On: 04/22/2019 14:39   Mr Lumbar Spine Wo Contrast  Result Date: 04/22/2019 CLINICAL DATA:  Left hip pain, back pain EXAM: MRI LUMBAR SPINE WITHOUT CONTRAST TECHNIQUE: Multiplanar, multisequence MR imaging of the lumbar spine was performed. No intravenous contrast was administered. COMPARISON:  X-ray 04/22/2019, MRI 07/07/2015 FINDINGS: Segmentation: Standard. Inferior-most well developed disc space is again designated L5-S1 in keeping with numbering convention on prior MRI 07/07/2015. Alignment: 7 mm grade 1 anterolisthesis L5 on S1 secondary to chronic bilateral pars defects. 4 mm grade 1 anterolisthesis L4 on L5. Trace retrolisthesis L1 on L2 and L2 on L3. Vertebrae: No fracture, evidence of discitis, or bone lesion. Discogenic endplate marrow changes most pronounced at L2-3 and L4-5. Conus medullaris and cauda equina: Conus extends to the L1 level. Conus and cauda equina appear normal. Paraspinal and other soft tissues: No acute findings. Disc levels: L1-L2: Diffuse disc bulge with bilateral facet arthrosis resulting in mild right foraminal stenosis and mild canal stenosis. Findings progressed from prior. L2-L3: Diffuse disc bulge with bilateral facet arthrosis and ligamentum flavum hypertrophy result in severe canal stenosis and severe left foraminal stenosis. There is mild-to-moderate right foraminal stenosis. Findings progressed from prior. L3-L4: Chronic disc height loss with degenerative endplate spurring. Bilateral facet arthrosis result in mild bilateral foraminal stenosis and borderline canal stenosis, not significantly changed from prior. L4-L5: Disc uncovering with superimposed right paracentral disc protrusion. Advanced right worse than left facet arthropathy results in moderate to severe right foraminal and right subarticular recess stenosis. Mild-to-moderate left  foraminal and subarticular  recess stenosis and mild canal stenosis. Previously seen inferior extension of a left subarticular disc extrusion has largely resolved. L5-S1: Disc uncovering with superimposed diffuse disc bulge and right greater than left facet arthrosis. Findings result in mild bilateral foraminal stenosis without canal stenosis. No significant interval progression from prior. IMPRESSION: 1. Progressive multilevel lumbar spondylosis most pronounced at the L2-3 level where there is now severe canal stenosis and severe left foraminal stenosis. 2. Mild canal stenosis at the L1-2 level has also progressed from prior. 3. Previously seen inferior extension of a left subarticular disc extrusion at L4-5 has largely resolved. 4. Right worse than left foraminal and subarticular recess stenosis at L4-5. 5. Stable grade 1 anterolisthesis L5 on S1 secondary to chronic bilateral pars defects. Electronically Signed   By: Davina Poke M.D.   On: 04/22/2019 19:59   Mr Hip Left Wo Contrast  Result Date: 04/22/2019 CLINICAL DATA:  Fall, left hip pain. EXAM: MR OF THE LEFT HIP WITHOUT CONTRAST TECHNIQUE: Multiplanar, multisequence MR imaging was performed. No intravenous contrast was administered. COMPARISON:  Radiographs from 04/22/2019 FINDINGS: Bones: 2.6 by 1.0 by 0.9 cm lesion with accentuated T2 signal characteristics in the lower femoral neck common no surrounding marrow edema. The conventional radiographic and MRI characteristics are not totally specific but favor enchondroma. There may be a small similar 0.8 cm lesion in the left femoral shaft. I do not believe this to be a postoperative appearance. Small degenerative subcortical cystic lesion anteriorly along the left femoral head. Spurring about the left femoral head and acetabulum. Considerable lumbar spondylosis and degenerative disc disease. Articular cartilage and labrum Articular cartilage: Mild to moderate degenerative chondral thinning  bilaterally. Labrum:  No paralabral cyst or gross labral tear identified. Joint or bursal effusion Joint effusion:  Absent Bursae: Mild trochanteric bursitis. Muscles and tendons Muscles and tendons: Suspected chronic partial tearing of the gluteus medius and minimus tendons distally and the greater trochanter. Extensive edema within and along the left iliopsoas muscle and distal tendon with tearing of the distal tendon without distinct rupture. I do not see an avulsion fragment on the conventional radiographs, and part of the iloipsoas is believed to attach ses fully to the lesser trochanteric region. There is considerable surrounding marrow edema including marrow edema tracking along the hip adductor musculature which may be sprained. No hamstring discontinuity or tear is identified. Edema in the left iloipsoas extends up to the level of the iliac crest. Other findings Miscellaneous: Sigmoid colon diverticulosis. Urinary bladder unremarkable. Uterus absent. IMPRESSION: 1. Torn but not ruptured distal iliopsoas tendon, with edema in the iliopsoas musculature and in the adjacent hip adductor musculature. 2. Suspected chronic partial tearing of the left gluteus medius and minimus tendons distally and the greater trochanter. 3. Mild trochanteric bursitis. 4. Mild to moderate degenerative chondral thinning in both hips. 5. Considerable lumbar spondylosis and degenerative disc disease. 6. Sigmoid colon diverticulosis. 7. 2.6 by 1.0 cm lesion in the lower femoral neck, probably enchondroma. Electronically Signed   By: Van Clines M.D.   On: 04/22/2019 19:44   Dg Hip Unilat W Or Wo Pelvis 2-3 Views Left  Result Date: 04/22/2019 CLINICAL DATA:  Recent fall.  Left hip pain EXAM: DG HIP (WITH OR WITHOUT PELVIS) 2-3V LEFT COMPARISON:  None. FINDINGS: Negative for acute fracture or dislocation. Degenerative change in spurring of the acetabulum on the left. Mild to moderate degenerative changes in the right hip. No  pelvic fracture. IMPRESSION: Negative for acute fracture. Electronically Signed   By:  Franchot Gallo M.D.   On: 04/22/2019 14:40    Subjective: Seen and examined at bedside she is not complaining of any pain today.  Was sitting up eating breakfast.  No nausea or vomiting.  Denies any lightheadedness or dizziness.  No other concerns reported at this time and states that she had a bowel movement last night.  Discharge Exam: Vitals:   04/24/19 0502 04/24/19 0853  BP: 106/88 (!) 144/67  Pulse: 84 79  Resp: 16 14  Temp: 98.4 F (36.9 C)   SpO2: 96% 97%   Vitals:   04/23/19 1415 04/23/19 2140 04/24/19 0502 04/24/19 0853  BP: (!) 126/45 126/61 106/88 (!) 144/67  Pulse: 83 84 84 79  Resp: 19 16 16 14   Temp: 98.6 F (37 C) 98.4 F (36.9 C) 98.4 F (36.9 C)   TempSrc: Oral Oral Oral   SpO2: 100% 100% 96% 97%  Weight:   71.3 kg   Height:       General: Pt is alert, awake, not in acute distress Cardiovascular: RRR, S1/S2 +, no rubs, no gallops Respiratory: Diminished bilaterally, no wheezing, no rhonchi Abdominal: Soft, NT, Distended 2/2 body habitus, bowel sounds + Extremities: No appreciable edema, no cyanosis  The results of significant diagnostics from this hospitalization (including imaging, microbiology, ancillary and laboratory) are listed below for reference.    Microbiology: Recent Results (from the past 240 hour(s))  SARS CORONAVIRUS 2 (TAT 6-24 HRS) Nasopharyngeal Nasopharyngeal Swab     Status: None   Collection Time: 04/22/19  4:50 PM   Specimen: Nasopharyngeal Swab  Result Value Ref Range Status   SARS Coronavirus 2 NEGATIVE NEGATIVE Final    Comment: (NOTE) SARS-CoV-2 target nucleic acids are NOT DETECTED. The SARS-CoV-2 RNA is generally detectable in upper and lower respiratory specimens during the acute phase of infection. Negative results do not preclude SARS-CoV-2 infection, do not rule out co-infections with other pathogens, and should not be used as the sole  basis for treatment or other patient management decisions. Negative results must be combined with clinical observations, patient history, and epidemiological information. The expected result is Negative. Fact Sheet for Patients: SugarRoll.be Fact Sheet for Healthcare Providers: https://www.woods-mathews.com/ This test is not yet approved or cleared by the [place] FDA and  has been authorized for detection and/or diagnosis of SARS-CoV-2 by FDA under an Emergency Use Authorization (EUA). This EUA will remain  in effect (meaning this test can be used) for the duration of the COVID-19 declaration under Section 56 4(b)(1) of the Act, 21 U.S.C. section 360bbb-3(b)(1), unless the authorization is terminated or revoked sooner. Performed at [facility], [place] [address], [place], [place] [address]     Labs: BNP (last 3 results) No results for input(s): BNP in the last 8760 hours. Basic Metabolic Panel: Recent Labs  Lab 04/22/19 1442 04/23/19 0212 04/24/19 0326  NA 139 138 133*  K 4.5 4.5 3.9  CL 109 110 104  CO2 22 20* 18*  GLUCOSE 162* 115* 153*  BUN 34* 30* 28*  CREATININE 1.41* 1.38* 1.42*  CALCIUM 8.9 9.0 8.8*  MG  --   --  1.9  PHOS  --   --  2.8   Liver Function Tests: Recent Labs  Lab 04/24/19 0326  AST 19  ALT 15  ALKPHOS 55  BILITOT 0.9  PROT 6.1*  ALBUMIN 3.2*   No results for input(s): LIPASE, AMYLASE in the last 168 hours. No results for input(s): AMMONIA in the last 168  hours. CBC: Recent Labs  Lab 04/22/19 1442 04/23/19 0212 04/24/19 0326  WBC 11.1* 9.4 10.2  NEUTROABS  --   --  7.7  HGB 11.0* 10.9* 11.1*  HCT 35.4* 34.6* 34.7*  MCV 89.6 90.1 88.3  PLT 259 242 233   Cardiac Enzymes: No results for input(s): CKTOTAL, CKMB, CKMBINDEX, TROPONINI in the last 168 hours. BNP: Invalid input(s): POCBNP CBG: Recent Labs  Lab 04/23/19 0808 04/23/19 1139 04/23/19 1721 04/23/19 2143 04/24/19 0737   GLUCAP 121* 221* 128* 226* 134*   D-Dimer No results for input(s): DDIMER in the last 72 hours. Hgb A1c Recent Labs    04/22/19 1458  HGBA1C 6.4*   Lipid Profile No results for input(s): CHOL, HDL, LDLCALC, TRIG, CHOLHDL, LDLDIRECT in the last 72 hours. Thyroid function studies No results for input(s): TSH, T4TOTAL, T3FREE, THYROIDAB in the last 72 hours.  Invalid input(s): FREET3 Anemia work up Recent Labs    04/24/19 0326  VITAMINB12 790  FOLATE 20.7  FERRITIN 35  TIBC 337  IRON 24*  RETICCTPCT 1.2   Urinalysis    Component Value Date/Time   COLORURINE YELLOW 07/19/2011 [place] 07/19/2011 1755   LABSPEC 1.015 07/19/2011 1755   PHURINE 7.0 07/19/2011 1755   GLUCOSEU NEGATIVE 07/19/2011 1755   HGBUR NEGATIVE 07/19/2011 [place] 07/19/2011 [place] 07/19/2011 1755   PROTEINUR NEGATIVE 07/19/2011 1755   UROBILINOGEN 1.0 07/19/2011 1755   NITRITE NEGATIVE 07/19/2011 1755   LEUKOCYTESUR SMALL (A) 07/19/2011 1755   Sepsis Labs Invalid input(s): PROCALCITONIN,  WBC,  LACTICIDVEN Microbiology Recent Results (from the past 240 hour(s))  SARS CORONAVIRUS 2 (TAT 6-24 HRS) Nasopharyngeal Nasopharyngeal Swab     Status: None   Collection Time: 04/22/19  4:50 PM   Specimen: Nasopharyngeal Swab  Result Value Ref Range Status   SARS Coronavirus 2 NEGATIVE NEGATIVE Final    Comment: (NOTE) SARS-CoV-2 target nucleic acids are NOT DETECTED. The SARS-CoV-2 RNA is generally detectable in upper and lower respiratory specimens during the acute phase of infection. Negative results do not preclude SARS-CoV-2 infection, do not rule out co-infections with other pathogens, and should not be used as the sole basis for treatment or other patient management decisions. Negative results must be combined with clinical observations, patient history, and epidemiological information. The expected result is Negative. Fact Sheet for  Patients: SugarRoll.be Fact Sheet for Healthcare Providers: https://www.woods-mathews.com/ This test is not yet approved or cleared by the [place] FDA and  has been authorized for detection and/or diagnosis of SARS-CoV-2 by FDA under an Emergency Use Authorization (EUA). This EUA will remain  in effect (meaning this test can be used) for the duration of the COVID-19 declaration under Section 56 4(b)(1) of the Act, 21 U.S.C. section 360bbb-3(b)(1), unless the authorization is terminated or revoked sooner. Performed at [facility], [place] [address] [place], [place] [address]    Time coordinating discharge: 35 minutes  SIGNED:  Kerney Elbe, DO [facility] 04/24/2019, 12:22 PM Pager is on [place]  If 7PM-7AM, please contact night-coverage [email or web address] Password [email or web address]

## 2019-04-26 DIAGNOSIS — S76912D Strain of unspecified muscles, fascia and tendons at thigh level, left thigh, subsequent encounter: Secondary | ICD-10-CM | POA: Diagnosis not present

## 2019-04-26 DIAGNOSIS — I252 Old myocardial infarction: Secondary | ICD-10-CM | POA: Diagnosis not present

## 2019-04-26 DIAGNOSIS — M4726 Other spondylosis with radiculopathy, lumbar region: Secondary | ICD-10-CM | POA: Diagnosis not present

## 2019-04-26 DIAGNOSIS — S76012D Strain of muscle, fascia and tendon of left hip, subsequent encounter: Secondary | ICD-10-CM | POA: Diagnosis not present

## 2019-04-26 DIAGNOSIS — I2510 Atherosclerotic heart disease of native coronary artery without angina pectoris: Secondary | ICD-10-CM | POA: Diagnosis not present

## 2019-04-26 DIAGNOSIS — I130 Hypertensive heart and chronic kidney disease with heart failure and stage 1 through stage 4 chronic kidney disease, or unspecified chronic kidney disease: Secondary | ICD-10-CM | POA: Diagnosis not present

## 2019-04-26 DIAGNOSIS — I255 Ischemic cardiomyopathy: Secondary | ICD-10-CM | POA: Diagnosis not present

## 2019-04-26 DIAGNOSIS — S39013D Strain of muscle, fascia and tendon of pelvis, subsequent encounter: Secondary | ICD-10-CM | POA: Diagnosis not present

## 2019-04-26 DIAGNOSIS — E1122 Type 2 diabetes mellitus with diabetic chronic kidney disease: Secondary | ICD-10-CM | POA: Diagnosis not present

## 2019-04-26 DIAGNOSIS — I509 Heart failure, unspecified: Secondary | ICD-10-CM | POA: Diagnosis not present

## 2019-04-29 DIAGNOSIS — I509 Heart failure, unspecified: Secondary | ICD-10-CM | POA: Diagnosis not present

## 2019-04-29 DIAGNOSIS — I255 Ischemic cardiomyopathy: Secondary | ICD-10-CM | POA: Diagnosis not present

## 2019-04-29 DIAGNOSIS — E1122 Type 2 diabetes mellitus with diabetic chronic kidney disease: Secondary | ICD-10-CM | POA: Diagnosis not present

## 2019-04-29 DIAGNOSIS — M4726 Other spondylosis with radiculopathy, lumbar region: Secondary | ICD-10-CM | POA: Diagnosis not present

## 2019-04-29 DIAGNOSIS — I252 Old myocardial infarction: Secondary | ICD-10-CM | POA: Diagnosis not present

## 2019-04-29 DIAGNOSIS — I2510 Atherosclerotic heart disease of native coronary artery without angina pectoris: Secondary | ICD-10-CM | POA: Diagnosis not present

## 2019-04-29 DIAGNOSIS — S76012D Strain of muscle, fascia and tendon of left hip, subsequent encounter: Secondary | ICD-10-CM | POA: Diagnosis not present

## 2019-04-29 DIAGNOSIS — S39013D Strain of muscle, fascia and tendon of pelvis, subsequent encounter: Secondary | ICD-10-CM | POA: Diagnosis not present

## 2019-04-29 DIAGNOSIS — S76912D Strain of unspecified muscles, fascia and tendons at thigh level, left thigh, subsequent encounter: Secondary | ICD-10-CM | POA: Diagnosis not present

## 2019-04-29 DIAGNOSIS — I130 Hypertensive heart and chronic kidney disease with heart failure and stage 1 through stage 4 chronic kidney disease, or unspecified chronic kidney disease: Secondary | ICD-10-CM | POA: Diagnosis not present

## 2019-04-30 DIAGNOSIS — S39013D Strain of muscle, fascia and tendon of pelvis, subsequent encounter: Secondary | ICD-10-CM | POA: Diagnosis not present

## 2019-04-30 DIAGNOSIS — I252 Old myocardial infarction: Secondary | ICD-10-CM | POA: Diagnosis not present

## 2019-04-30 DIAGNOSIS — E1122 Type 2 diabetes mellitus with diabetic chronic kidney disease: Secondary | ICD-10-CM | POA: Diagnosis not present

## 2019-04-30 DIAGNOSIS — I130 Hypertensive heart and chronic kidney disease with heart failure and stage 1 through stage 4 chronic kidney disease, or unspecified chronic kidney disease: Secondary | ICD-10-CM | POA: Diagnosis not present

## 2019-04-30 DIAGNOSIS — M4726 Other spondylosis with radiculopathy, lumbar region: Secondary | ICD-10-CM | POA: Diagnosis not present

## 2019-04-30 DIAGNOSIS — I509 Heart failure, unspecified: Secondary | ICD-10-CM | POA: Diagnosis not present

## 2019-04-30 DIAGNOSIS — I2510 Atherosclerotic heart disease of native coronary artery without angina pectoris: Secondary | ICD-10-CM | POA: Diagnosis not present

## 2019-04-30 DIAGNOSIS — S76912D Strain of unspecified muscles, fascia and tendons at thigh level, left thigh, subsequent encounter: Secondary | ICD-10-CM | POA: Diagnosis not present

## 2019-04-30 DIAGNOSIS — I255 Ischemic cardiomyopathy: Secondary | ICD-10-CM | POA: Diagnosis not present

## 2019-04-30 DIAGNOSIS — S76012D Strain of muscle, fascia and tendon of left hip, subsequent encounter: Secondary | ICD-10-CM | POA: Diagnosis not present

## 2019-05-05 DIAGNOSIS — I2510 Atherosclerotic heart disease of native coronary artery without angina pectoris: Secondary | ICD-10-CM | POA: Diagnosis not present

## 2019-05-05 DIAGNOSIS — I255 Ischemic cardiomyopathy: Secondary | ICD-10-CM | POA: Diagnosis not present

## 2019-05-05 DIAGNOSIS — E1122 Type 2 diabetes mellitus with diabetic chronic kidney disease: Secondary | ICD-10-CM | POA: Diagnosis not present

## 2019-05-05 DIAGNOSIS — S76912D Strain of unspecified muscles, fascia and tendons at thigh level, left thigh, subsequent encounter: Secondary | ICD-10-CM | POA: Diagnosis not present

## 2019-05-05 DIAGNOSIS — I252 Old myocardial infarction: Secondary | ICD-10-CM | POA: Diagnosis not present

## 2019-05-05 DIAGNOSIS — I509 Heart failure, unspecified: Secondary | ICD-10-CM | POA: Diagnosis not present

## 2019-05-05 DIAGNOSIS — S39013D Strain of muscle, fascia and tendon of pelvis, subsequent encounter: Secondary | ICD-10-CM | POA: Diagnosis not present

## 2019-05-05 DIAGNOSIS — S76012D Strain of muscle, fascia and tendon of left hip, subsequent encounter: Secondary | ICD-10-CM | POA: Diagnosis not present

## 2019-05-05 DIAGNOSIS — M4726 Other spondylosis with radiculopathy, lumbar region: Secondary | ICD-10-CM | POA: Diagnosis not present

## 2019-05-05 DIAGNOSIS — I130 Hypertensive heart and chronic kidney disease with heart failure and stage 1 through stage 4 chronic kidney disease, or unspecified chronic kidney disease: Secondary | ICD-10-CM | POA: Diagnosis not present

## 2019-05-07 DIAGNOSIS — M4726 Other spondylosis with radiculopathy, lumbar region: Secondary | ICD-10-CM | POA: Diagnosis not present

## 2019-05-07 DIAGNOSIS — I509 Heart failure, unspecified: Secondary | ICD-10-CM | POA: Diagnosis not present

## 2019-05-07 DIAGNOSIS — S76912D Strain of unspecified muscles, fascia and tendons at thigh level, left thigh, subsequent encounter: Secondary | ICD-10-CM | POA: Diagnosis not present

## 2019-05-07 DIAGNOSIS — E1122 Type 2 diabetes mellitus with diabetic chronic kidney disease: Secondary | ICD-10-CM | POA: Diagnosis not present

## 2019-05-07 DIAGNOSIS — S39013D Strain of muscle, fascia and tendon of pelvis, subsequent encounter: Secondary | ICD-10-CM | POA: Diagnosis not present

## 2019-05-07 DIAGNOSIS — I130 Hypertensive heart and chronic kidney disease with heart failure and stage 1 through stage 4 chronic kidney disease, or unspecified chronic kidney disease: Secondary | ICD-10-CM | POA: Diagnosis not present

## 2019-05-07 DIAGNOSIS — I2510 Atherosclerotic heart disease of native coronary artery without angina pectoris: Secondary | ICD-10-CM | POA: Diagnosis not present

## 2019-05-07 DIAGNOSIS — I255 Ischemic cardiomyopathy: Secondary | ICD-10-CM | POA: Diagnosis not present

## 2019-05-07 DIAGNOSIS — S76012D Strain of muscle, fascia and tendon of left hip, subsequent encounter: Secondary | ICD-10-CM | POA: Diagnosis not present

## 2019-05-07 DIAGNOSIS — I252 Old myocardial infarction: Secondary | ICD-10-CM | POA: Diagnosis not present

## 2019-05-08 DIAGNOSIS — S76012D Strain of muscle, fascia and tendon of left hip, subsequent encounter: Secondary | ICD-10-CM | POA: Diagnosis not present

## 2019-05-08 DIAGNOSIS — I2510 Atherosclerotic heart disease of native coronary artery without angina pectoris: Secondary | ICD-10-CM | POA: Diagnosis not present

## 2019-05-08 DIAGNOSIS — M4726 Other spondylosis with radiculopathy, lumbar region: Secondary | ICD-10-CM | POA: Diagnosis not present

## 2019-05-08 DIAGNOSIS — S39013D Strain of muscle, fascia and tendon of pelvis, subsequent encounter: Secondary | ICD-10-CM | POA: Diagnosis not present

## 2019-05-08 DIAGNOSIS — I252 Old myocardial infarction: Secondary | ICD-10-CM | POA: Diagnosis not present

## 2019-05-08 DIAGNOSIS — I255 Ischemic cardiomyopathy: Secondary | ICD-10-CM | POA: Diagnosis not present

## 2019-05-08 DIAGNOSIS — I130 Hypertensive heart and chronic kidney disease with heart failure and stage 1 through stage 4 chronic kidney disease, or unspecified chronic kidney disease: Secondary | ICD-10-CM | POA: Diagnosis not present

## 2019-05-08 DIAGNOSIS — S76912D Strain of unspecified muscles, fascia and tendons at thigh level, left thigh, subsequent encounter: Secondary | ICD-10-CM | POA: Diagnosis not present

## 2019-05-08 DIAGNOSIS — E1122 Type 2 diabetes mellitus with diabetic chronic kidney disease: Secondary | ICD-10-CM | POA: Diagnosis not present

## 2019-05-08 DIAGNOSIS — I509 Heart failure, unspecified: Secondary | ICD-10-CM | POA: Diagnosis not present

## 2019-05-15 DIAGNOSIS — I509 Heart failure, unspecified: Secondary | ICD-10-CM | POA: Diagnosis not present

## 2019-05-15 DIAGNOSIS — I130 Hypertensive heart and chronic kidney disease with heart failure and stage 1 through stage 4 chronic kidney disease, or unspecified chronic kidney disease: Secondary | ICD-10-CM | POA: Diagnosis not present

## 2019-05-15 DIAGNOSIS — S76012D Strain of muscle, fascia and tendon of left hip, subsequent encounter: Secondary | ICD-10-CM | POA: Diagnosis not present

## 2019-05-15 DIAGNOSIS — S76912D Strain of unspecified muscles, fascia and tendons at thigh level, left thigh, subsequent encounter: Secondary | ICD-10-CM | POA: Diagnosis not present

## 2019-05-15 DIAGNOSIS — S39013D Strain of muscle, fascia and tendon of pelvis, subsequent encounter: Secondary | ICD-10-CM | POA: Diagnosis not present

## 2019-05-15 DIAGNOSIS — M4726 Other spondylosis with radiculopathy, lumbar region: Secondary | ICD-10-CM | POA: Diagnosis not present

## 2019-05-15 DIAGNOSIS — I255 Ischemic cardiomyopathy: Secondary | ICD-10-CM | POA: Diagnosis not present

## 2019-05-15 DIAGNOSIS — E1122 Type 2 diabetes mellitus with diabetic chronic kidney disease: Secondary | ICD-10-CM | POA: Diagnosis not present

## 2019-05-15 DIAGNOSIS — I2510 Atherosclerotic heart disease of native coronary artery without angina pectoris: Secondary | ICD-10-CM | POA: Diagnosis not present

## 2019-05-15 DIAGNOSIS — I252 Old myocardial infarction: Secondary | ICD-10-CM | POA: Diagnosis not present

## 2019-05-16 DIAGNOSIS — I130 Hypertensive heart and chronic kidney disease with heart failure and stage 1 through stage 4 chronic kidney disease, or unspecified chronic kidney disease: Secondary | ICD-10-CM | POA: Diagnosis not present

## 2019-05-16 DIAGNOSIS — E1122 Type 2 diabetes mellitus with diabetic chronic kidney disease: Secondary | ICD-10-CM | POA: Diagnosis not present

## 2019-05-16 DIAGNOSIS — M4726 Other spondylosis with radiculopathy, lumbar region: Secondary | ICD-10-CM | POA: Diagnosis not present

## 2019-05-16 DIAGNOSIS — I2510 Atherosclerotic heart disease of native coronary artery without angina pectoris: Secondary | ICD-10-CM | POA: Diagnosis not present

## 2019-05-16 DIAGNOSIS — S76012D Strain of muscle, fascia and tendon of left hip, subsequent encounter: Secondary | ICD-10-CM | POA: Diagnosis not present

## 2019-05-16 DIAGNOSIS — S76912D Strain of unspecified muscles, fascia and tendons at thigh level, left thigh, subsequent encounter: Secondary | ICD-10-CM | POA: Diagnosis not present

## 2019-05-16 DIAGNOSIS — I509 Heart failure, unspecified: Secondary | ICD-10-CM | POA: Diagnosis not present

## 2019-05-16 DIAGNOSIS — I252 Old myocardial infarction: Secondary | ICD-10-CM | POA: Diagnosis not present

## 2019-05-16 DIAGNOSIS — S39013D Strain of muscle, fascia and tendon of pelvis, subsequent encounter: Secondary | ICD-10-CM | POA: Diagnosis not present

## 2019-05-16 DIAGNOSIS — I255 Ischemic cardiomyopathy: Secondary | ICD-10-CM | POA: Diagnosis not present

## 2019-05-21 DIAGNOSIS — I2510 Atherosclerotic heart disease of native coronary artery without angina pectoris: Secondary | ICD-10-CM | POA: Diagnosis not present

## 2019-05-21 DIAGNOSIS — I252 Old myocardial infarction: Secondary | ICD-10-CM | POA: Diagnosis not present

## 2019-05-21 DIAGNOSIS — I255 Ischemic cardiomyopathy: Secondary | ICD-10-CM | POA: Diagnosis not present

## 2019-05-21 DIAGNOSIS — I509 Heart failure, unspecified: Secondary | ICD-10-CM | POA: Diagnosis not present

## 2019-05-21 DIAGNOSIS — S76012D Strain of muscle, fascia and tendon of left hip, subsequent encounter: Secondary | ICD-10-CM | POA: Diagnosis not present

## 2019-05-21 DIAGNOSIS — I130 Hypertensive heart and chronic kidney disease with heart failure and stage 1 through stage 4 chronic kidney disease, or unspecified chronic kidney disease: Secondary | ICD-10-CM | POA: Diagnosis not present

## 2019-05-21 DIAGNOSIS — M4726 Other spondylosis with radiculopathy, lumbar region: Secondary | ICD-10-CM | POA: Diagnosis not present

## 2019-05-21 DIAGNOSIS — S39013D Strain of muscle, fascia and tendon of pelvis, subsequent encounter: Secondary | ICD-10-CM | POA: Diagnosis not present

## 2019-05-21 DIAGNOSIS — E1122 Type 2 diabetes mellitus with diabetic chronic kidney disease: Secondary | ICD-10-CM | POA: Diagnosis not present

## 2019-05-21 DIAGNOSIS — S76912D Strain of unspecified muscles, fascia and tendons at thigh level, left thigh, subsequent encounter: Secondary | ICD-10-CM | POA: Diagnosis not present

## 2019-05-24 DIAGNOSIS — M25559 Pain in unspecified hip: Secondary | ICD-10-CM | POA: Diagnosis not present

## 2019-05-29 DIAGNOSIS — S39013D Strain of muscle, fascia and tendon of pelvis, subsequent encounter: Secondary | ICD-10-CM | POA: Diagnosis not present

## 2019-05-29 DIAGNOSIS — I255 Ischemic cardiomyopathy: Secondary | ICD-10-CM | POA: Diagnosis not present

## 2019-05-29 DIAGNOSIS — M4726 Other spondylosis with radiculopathy, lumbar region: Secondary | ICD-10-CM | POA: Diagnosis not present

## 2019-05-29 DIAGNOSIS — I509 Heart failure, unspecified: Secondary | ICD-10-CM | POA: Diagnosis not present

## 2019-05-29 DIAGNOSIS — I252 Old myocardial infarction: Secondary | ICD-10-CM | POA: Diagnosis not present

## 2019-05-29 DIAGNOSIS — S76912D Strain of unspecified muscles, fascia and tendons at thigh level, left thigh, subsequent encounter: Secondary | ICD-10-CM | POA: Diagnosis not present

## 2019-05-29 DIAGNOSIS — E1122 Type 2 diabetes mellitus with diabetic chronic kidney disease: Secondary | ICD-10-CM | POA: Diagnosis not present

## 2019-05-29 DIAGNOSIS — I2510 Atherosclerotic heart disease of native coronary artery without angina pectoris: Secondary | ICD-10-CM | POA: Diagnosis not present

## 2019-05-29 DIAGNOSIS — I130 Hypertensive heart and chronic kidney disease with heart failure and stage 1 through stage 4 chronic kidney disease, or unspecified chronic kidney disease: Secondary | ICD-10-CM | POA: Diagnosis not present

## 2019-05-29 DIAGNOSIS — S76012D Strain of muscle, fascia and tendon of left hip, subsequent encounter: Secondary | ICD-10-CM | POA: Diagnosis not present

## 2019-06-03 DIAGNOSIS — E1122 Type 2 diabetes mellitus with diabetic chronic kidney disease: Secondary | ICD-10-CM | POA: Diagnosis not present

## 2019-06-03 DIAGNOSIS — M4726 Other spondylosis with radiculopathy, lumbar region: Secondary | ICD-10-CM | POA: Diagnosis not present

## 2019-06-03 DIAGNOSIS — I509 Heart failure, unspecified: Secondary | ICD-10-CM | POA: Diagnosis not present

## 2019-06-03 DIAGNOSIS — I252 Old myocardial infarction: Secondary | ICD-10-CM | POA: Diagnosis not present

## 2019-06-03 DIAGNOSIS — I2510 Atherosclerotic heart disease of native coronary artery without angina pectoris: Secondary | ICD-10-CM | POA: Diagnosis not present

## 2019-06-03 DIAGNOSIS — S39013D Strain of muscle, fascia and tendon of pelvis, subsequent encounter: Secondary | ICD-10-CM | POA: Diagnosis not present

## 2019-06-03 DIAGNOSIS — S76912D Strain of unspecified muscles, fascia and tendons at thigh level, left thigh, subsequent encounter: Secondary | ICD-10-CM | POA: Diagnosis not present

## 2019-06-03 DIAGNOSIS — I255 Ischemic cardiomyopathy: Secondary | ICD-10-CM | POA: Diagnosis not present

## 2019-06-03 DIAGNOSIS — I130 Hypertensive heart and chronic kidney disease with heart failure and stage 1 through stage 4 chronic kidney disease, or unspecified chronic kidney disease: Secondary | ICD-10-CM | POA: Diagnosis not present

## 2019-06-03 DIAGNOSIS — S76012D Strain of muscle, fascia and tendon of left hip, subsequent encounter: Secondary | ICD-10-CM | POA: Diagnosis not present

## 2019-06-11 DIAGNOSIS — R69 Illness, unspecified: Secondary | ICD-10-CM | POA: Diagnosis not present

## 2019-06-12 DIAGNOSIS — N3000 Acute cystitis without hematuria: Secondary | ICD-10-CM | POA: Diagnosis not present

## 2019-06-12 DIAGNOSIS — N3941 Urge incontinence: Secondary | ICD-10-CM | POA: Diagnosis not present

## 2019-06-13 DIAGNOSIS — S76012D Strain of muscle, fascia and tendon of left hip, subsequent encounter: Secondary | ICD-10-CM | POA: Diagnosis not present

## 2019-06-13 DIAGNOSIS — I255 Ischemic cardiomyopathy: Secondary | ICD-10-CM | POA: Diagnosis not present

## 2019-06-13 DIAGNOSIS — E1122 Type 2 diabetes mellitus with diabetic chronic kidney disease: Secondary | ICD-10-CM | POA: Diagnosis not present

## 2019-06-13 DIAGNOSIS — I2510 Atherosclerotic heart disease of native coronary artery without angina pectoris: Secondary | ICD-10-CM | POA: Diagnosis not present

## 2019-06-13 DIAGNOSIS — I509 Heart failure, unspecified: Secondary | ICD-10-CM | POA: Diagnosis not present

## 2019-06-13 DIAGNOSIS — I252 Old myocardial infarction: Secondary | ICD-10-CM | POA: Diagnosis not present

## 2019-06-13 DIAGNOSIS — S39013D Strain of muscle, fascia and tendon of pelvis, subsequent encounter: Secondary | ICD-10-CM | POA: Diagnosis not present

## 2019-06-13 DIAGNOSIS — I130 Hypertensive heart and chronic kidney disease with heart failure and stage 1 through stage 4 chronic kidney disease, or unspecified chronic kidney disease: Secondary | ICD-10-CM | POA: Diagnosis not present

## 2019-06-13 DIAGNOSIS — M4726 Other spondylosis with radiculopathy, lumbar region: Secondary | ICD-10-CM | POA: Diagnosis not present

## 2019-06-13 DIAGNOSIS — S76912D Strain of unspecified muscles, fascia and tendons at thigh level, left thigh, subsequent encounter: Secondary | ICD-10-CM | POA: Diagnosis not present

## 2019-06-16 ENCOUNTER — Ambulatory Visit: Payer: Medicare HMO | Admitting: Podiatry

## 2019-06-16 ENCOUNTER — Other Ambulatory Visit: Payer: Self-pay

## 2019-06-16 DIAGNOSIS — M79674 Pain in right toe(s): Secondary | ICD-10-CM | POA: Diagnosis not present

## 2019-06-16 DIAGNOSIS — M79675 Pain in left toe(s): Secondary | ICD-10-CM

## 2019-06-16 DIAGNOSIS — B351 Tinea unguium: Secondary | ICD-10-CM

## 2019-06-16 NOTE — Patient Instructions (Signed)
Diabetes Mellitus and Foot Care Foot care is an important part of your health, especially when you have diabetes. Diabetes may cause you to have problems because of poor blood flow (circulation) to your feet and legs, which can cause your skin to:  Become thinner and drier.  Break more easily.  Heal more slowly.  Peel and crack. You may also have nerve damage (neuropathy) in your legs and feet, causing decreased feeling in them. This means that you may not notice minor injuries to your feet that could lead to more serious problems. Noticing and addressing any potential problems early is the best way to prevent future foot problems. How to care for your feet Foot hygiene  Wash your feet daily with warm water and mild soap. Do not use hot water. Then, pat your feet and the areas between your toes until they are completely dry. Do not soak your feet as this can dry your skin.  Trim your toenails straight across. Do not dig under them or around the cuticle. File the edges of your nails with an emery board or nail file.  Apply a moisturizing lotion or petroleum jelly to the skin on your feet and to dry, brittle toenails. Use lotion that does not contain alcohol and is unscented. Do not apply lotion between your toes. Shoes and socks  Wear clean socks or stockings every day. Make sure they are not too tight. Do not wear knee-high stockings since they may decrease blood flow to your legs.  Wear shoes that fit properly and have enough cushioning. Always look in your shoes before you put them on to be sure there are no objects inside.  To break in new shoes, wear them for just a few hours a day. This prevents injuries on your feet. Wounds, scrapes, corns, and calluses  Check your feet daily for blisters, cuts, bruises, sores, and redness. If you cannot see the bottom of your feet, use a mirror or ask someone for help.  Do not cut corns or calluses or try to remove them with medicine.  If you  find a minor scrape, cut, or break in the skin on your feet, keep it and the skin around it clean and dry. You may clean these areas with mild soap and water. Do not clean the area with peroxide, alcohol, or iodine.  If you have a wound, scrape, corn, or callus on your foot, look at it several times a day to make sure it is healing and not infected. Check for: ? Redness, swelling, or pain. ? Fluid or blood. ? Warmth. ? Pus or a bad smell. General instructions  Do not cross your legs. This may decrease blood flow to your feet.  Do not use heating pads or hot water bottles on your feet. They may burn your skin. If you have lost feeling in your feet or legs, you may not know this is happening until it is too late.  Protect your feet from hot and cold by wearing shoes, such as at the beach or on hot pavement.  Schedule a complete foot exam at least once a year (annually) or more often if you have foot problems. If you have foot problems, report any cuts, sores, or bruises to your health care provider immediately. Contact a health care provider if:  You have a medical condition that increases your risk of infection and you have any cuts, sores, or bruises on your feet.  You have an injury that is not   healing.  You have redness on your legs or feet.  You feel burning or tingling in your legs or feet.  You have pain or cramps in your legs and feet.  Your legs or feet are numb.  Your feet always feel cold.  You have pain around a toenail. Get help right away if:  You have a wound, scrape, corn, or callus on your foot and: ? You have pain, swelling, or redness that gets worse. ? You have fluid or blood coming from the wound, scrape, corn, or callus. ? Your wound, scrape, corn, or callus feels warm to the touch. ? You have pus or a bad smell coming from the wound, scrape, corn, or callus. ? You have a fever. ? You have a red line going up your leg. Summary  Check your feet every day  for cuts, sores, red spots, swelling, and blisters.  Moisturize feet and legs daily.  Wear shoes that fit properly and have enough cushioning.  If you have foot problems, report any cuts, sores, or bruises to your health care provider immediately.  Schedule a complete foot exam at least once a year (annually) or more often if you have foot problems. This information is not intended to replace advice given to you by your health care provider. Make sure you discuss any questions you have with your health care provider. Document Revised: 02/12/2019 Document Reviewed: 06/23/2016 Elsevier Patient Education  2020 Elsevier Inc.  

## 2019-06-16 NOTE — Progress Notes (Signed)
Subjective: [name] presents today for preventative diabetic foot. Patient is seen for follow up of painful, mycotic toenails which interfere with comfortable ambulation when wearing enclosed shoe gear. Pain is relieved with periodic professional debridement.  She voices no new pedal problems on today's visit.  Medications reviewed in chart.  Allergies  Allergen Reactions  . Amoxapine And Related Hives  . Clozapine Hives   Objective: There were no vitals filed for this visit.  Vascular Examination: Capillary refill time to digits immediate b/l.   Dorsalis pedis present b/l.  Posterior tibial pulses present b/l.  Digital hair present b/l.   Skin temperature gradient WNL b/l.   Dermatological Examination: Skin is thin, shiny and atrophic b/l.  Toenails 1-5 b/l discolored, thick, dystrophic with subungual debris and pain with palpation to nailbeds due to thickness of nails.  Dermatology has assessed her macule plantar sulcus 3rd digit and did not warrant further workup/biopsy.  Musculoskeletal: Muscle strength 5/5 b/l to all LE muscle groups.  Gross bony deformities:  None.  No pain, crepitus or joint limitation with passive/active ROM b/l.  Neurological Examination: Protective sensation intact 5/5 b/l with 10 gram monofilament.  Vibratory sensation intact bilaterally.   Assessment: 1. Painful onychomycosis toenails 1-5 b/l  Plan: 1.  Toenails 1-5 b/l were debrided in length and girth without iatrogenic bleeding. 2.  Patient to continue soft, supportive shoe gear. 3.  Patient to report any pedal injuries to medical professional. 4.  Follow up 3 months. 5.  Patient/POA to call should there be a concern in the interim.

## 2019-06-19 ENCOUNTER — Encounter: Payer: Self-pay | Admitting: Podiatry

## 2019-06-24 DIAGNOSIS — M25559 Pain in unspecified hip: Secondary | ICD-10-CM | POA: Diagnosis not present

## 2019-07-25 DIAGNOSIS — M25559 Pain in unspecified hip: Secondary | ICD-10-CM | POA: Diagnosis not present

## 2019-07-31 ENCOUNTER — Other Ambulatory Visit: Payer: Medicare HMO

## 2019-07-31 ENCOUNTER — Other Ambulatory Visit: Payer: Self-pay | Admitting: Sports Medicine

## 2019-07-31 DIAGNOSIS — M545 Low back pain, unspecified: Secondary | ICD-10-CM

## 2019-07-31 DIAGNOSIS — M4856XA Collapsed vertebra, not elsewhere classified, lumbar region, initial encounter for fracture: Secondary | ICD-10-CM | POA: Diagnosis not present

## 2019-07-31 DIAGNOSIS — M25551 Pain in right hip: Secondary | ICD-10-CM | POA: Diagnosis not present

## 2019-08-01 ENCOUNTER — Ambulatory Visit
Admission: RE | Admit: 2019-08-01 | Discharge: 2019-08-01 | Disposition: A | Discharge status: Home / Self Care | Payer: Medicare HMO | Source: Ambulatory Visit | Attending: Physician Assistant | Admitting: Physician Assistant

## 2019-08-01 ENCOUNTER — Other Ambulatory Visit: Payer: Self-pay | Admitting: Physician Assistant

## 2019-08-01 ENCOUNTER — Ambulatory Visit: Admission: RE | Admit: 2019-08-01 | Payer: Medicare HMO | Source: Ambulatory Visit

## 2019-08-01 DIAGNOSIS — M25551 Pain in right hip: Secondary | ICD-10-CM

## 2019-08-01 DIAGNOSIS — M545 Low back pain, unspecified: Secondary | ICD-10-CM

## 2019-08-01 DIAGNOSIS — R296 Repeated falls: Secondary | ICD-10-CM | POA: Diagnosis not present

## 2019-08-01 DIAGNOSIS — S32010A Wedge compression fracture of first lumbar vertebra, initial encounter for closed fracture: Secondary | ICD-10-CM | POA: Diagnosis not present

## 2019-08-05 DIAGNOSIS — M1611 Unilateral primary osteoarthritis, right hip: Secondary | ICD-10-CM | POA: Diagnosis not present

## 2019-08-05 DIAGNOSIS — M48061 Spinal stenosis, lumbar region without neurogenic claudication: Secondary | ICD-10-CM | POA: Diagnosis not present

## 2019-08-05 DIAGNOSIS — M25551 Pain in right hip: Secondary | ICD-10-CM | POA: Diagnosis not present

## 2019-08-05 DIAGNOSIS — M545 Low back pain: Secondary | ICD-10-CM | POA: Diagnosis not present

## 2019-08-07 DIAGNOSIS — N130 Hydronephrosis with ureteropelvic junction obstruction: Secondary | ICD-10-CM | POA: Diagnosis not present

## 2019-08-07 DIAGNOSIS — N3941 Urge incontinence: Secondary | ICD-10-CM | POA: Diagnosis not present

## 2019-08-07 DIAGNOSIS — N3020 Other chronic cystitis without hematuria: Secondary | ICD-10-CM | POA: Diagnosis not present

## 2019-08-08 DIAGNOSIS — N132 Hydronephrosis with renal and ureteral calculous obstruction: Secondary | ICD-10-CM | POA: Diagnosis not present

## 2019-08-08 DIAGNOSIS — N1330 Unspecified hydronephrosis: Secondary | ICD-10-CM | POA: Diagnosis not present

## 2019-08-14 DIAGNOSIS — D4101 Neoplasm of uncertain behavior of right kidney: Secondary | ICD-10-CM | POA: Diagnosis not present

## 2019-08-14 DIAGNOSIS — N3020 Other chronic cystitis without hematuria: Secondary | ICD-10-CM | POA: Diagnosis not present

## 2019-08-14 DIAGNOSIS — N3941 Urge incontinence: Secondary | ICD-10-CM | POA: Diagnosis not present

## 2019-08-14 DIAGNOSIS — N130 Hydronephrosis with ureteropelvic junction obstruction: Secondary | ICD-10-CM | POA: Diagnosis not present

## 2019-08-15 ENCOUNTER — Other Ambulatory Visit: Payer: Self-pay | Admitting: Urology

## 2019-08-19 NOTE — Patient Instructions (Signed)
DUE TO COVID-19 ONLY ONE VISITOR IS ALLOWED TO COME WITH YOU AND STAY IN THE WAITING ROOM ONLY DURING PRE OP AND PROCEDURE DAY OF SURGERY. THE 1 VISITOR MAY VISIT WITH YOU AFTER SURGERY IN YOUR PRIVATE ROOM DURING VISITING HOURS ONLY!  YOU NEED TO HAVE A COVID 19 TEST ON_3/20/21______ @_9 :30______, THIS TEST MUST BE DONE BEFORE SURGERY, COME  [address] [place] , [id number].  ([place]) ONCE YOUR COVID TEST IS COMPLETED, PLEASE BEGIN THE QUARANTINE INSTRUCTIONS AS OUTLINED IN YOUR HANDOUT.                [name]    Your procedure is scheduled on: 08/27/19   Report to [facility] Main  Entrance   Report to admitting at  9:45 AM     Call this number if you have problems the morning of surgery [phone]    Remember: Do not eat food or drink liquids :After Midnight.   BRUSH YOUR TEETH MORNING OF SURGERY AND RINSE YOUR MOUTH OUT, NO CHEWING GUM CANDY OR MINTS.     Take these medicines the morning of surgery with A SIP OF WATER: Coreg, Synthroid, Prilosec, Solifenacin  DO NOT TAKE ANY DIABETIC MEDICATIONS DAY OF YOUR SURGERY    How to Manage Your Diabetes Before and After Surgery  Why is it important to control my blood sugar before and after surgery? . Improving blood sugar levels before and after surgery helps healing and can limit problems. . A way of improving blood sugar control is eating a healthy diet by: o  Eating less sugar and carbohydrates o  Increasing activity/exercise o  Talking with your doctor about reaching your blood sugar goals . High blood sugars (greater than 180 mg/dL) can raise your risk of infections and slow your recovery, so you will need to focus on controlling your diabetes during the weeks before surgery. . Make sure that the doctor who takes care of your diabetes knows about your planned surgery including the date and location.  How do I manage my blood sugar before surgery? . Check your blood sugar at least  4 times a day, starting 2 days before surgery, to make sure that the level is not too high or low. o Check your blood sugar the morning of your surgery when you wake up and every 2 hours until you get to the Short Stay unit. . If your blood sugar is less than 70 mg/dL, you will need to treat for low blood sugar: o Do not take insulin. o Treat a low blood sugar (less than 70 mg/dL) with  cup of clear juice (cranberry or apple), 4 glucose tablets, OR glucose gel. o Recheck blood sugar in 15 minutes after treatment (to make sure it is greater than 70 mg/dL). If your blood sugar is not greater than 70 mg/dL on recheck, call [phone] for further instructions. . Report your blood sugar to the short stay nurse when you get to Short Stay.  . If you are admitted to the hospital after surgery: o Your blood sugar will be checked by the staff and you will probably be given insulin after surgery (instead of oral diabetes medicines) to make sure you have good blood sugar levels. o The goal for blood sugar control after surgery is 80-180 mg/dL.   WHAT DO I DO ABOUT MY DIABETES MEDICATION?  Marland Kitchen Do not take oral diabetes medicines (pills) the morning of surgery.  You may not have any metal on your body including hair pins and              piercings  Do not wear jewelry, make-up, lotions, powders or perfumes, deodorant             Do not wear nail polish on your fingernails.  Do not shave  48 hours prior to surgery.     Do not bring valuables to the hospital. [place].  Contacts, dentures or bridgework may not be worn into surgery.      Patients discharged the day of surgery will not be allowed to drive home.   IF YOU ARE HAVING SURGERY AND GOING HOME THE SAME DAY, YOU MUST HAVE AN ADULT TO DRIVE YOU HOME AND BE WITH YOU FOR 24 HOURS.   YOU MAY GO HOME BY TAXI OR UBER OR ORTHERWISE, BUT AN ADULT MUST ACCOMPANY YOU HOME AND  STAY WITH YOU FOR 24 HOURS.  Name and phone number of your driver:  Special Instructions: N/A              Please read over the following fact sheets you were given: _____________________________________________________________________             [facility] - Preparing for Surgery Before surgery, you can play an important role.  Because skin is not sterile, your skin needs to be as free of germs as possible.  You can reduce the number of germs on your skin by washing with CHG (chlorahexidine gluconate) soap before surgery.  CHG is an antiseptic cleaner which kills germs and bonds with the skin to continue killing germs even after washing. Please DO NOT use if you have an allergy to CHG or antibacterial soaps.  If your skin becomes reddened/irritated stop using the CHG and inform your nurse when you arrive at Short Stay. Do not shave (including legs and underarms) for at least 48 hours prior to the first CHG shower.  You may shave your face/neck. Please follow these instructions carefully:  1.  Shower with CHG Soap the night before surgery and the  morning of Surgery.  2.  If you choose to wash your hair, wash your hair first as usual with your  normal  shampoo.  3.  After you shampoo, rinse your hair and body thoroughly to remove the  shampoo.                                        4.  Use CHG as you would any other liquid soap.  You can apply chg directly  to the skin and wash                       Gently with a scrungie or clean washcloth.  5.  Apply the CHG Soap to your body ONLY FROM THE NECK DOWN.   Do not use on face/ open                           Wound or open sores. Avoid contact with eyes, ears mouth and genitals (private parts).  Wash face,  Genitals (private parts) with your normal soap.             6.  Wash thoroughly, paying special attention to the area where your surgery  will be performed.  7.  Thoroughly rinse your body with warm water from the neck  down.  8.  DO NOT shower/wash with your normal soap after using and rinsing off  the CHG Soap.             9.  Pat yourself dry with a clean towel.            10.  Wear clean pajamas.            11.  Place clean sheets on your bed the night of your first shower and do not  sleep with pets. Day of Surgery : Do not apply any lotions/deodorants the morning of surgery.  Please wear clean clothes to the hospital/surgery center.  FAILURE TO FOLLOW THESE INSTRUCTIONS MAY RESULT IN THE CANCELLATION OF YOUR SURGERY PATIENT SIGNATURE_________________________________  NURSE SIGNATURE__________________________________  ________________________________________________________________________

## 2019-08-20 ENCOUNTER — Encounter (HOSPITAL_COMMUNITY)
Admission: RE | Admit: 2019-08-20 | Discharge: 2019-08-20 | Disposition: A | Discharge status: Home / Self Care | Payer: Medicare HMO | Source: Ambulatory Visit | Attending: Urology | Admitting: Urology

## 2019-08-20 ENCOUNTER — Encounter (HOSPITAL_COMMUNITY): Payer: Self-pay

## 2019-08-20 ENCOUNTER — Other Ambulatory Visit: Payer: Self-pay

## 2019-08-20 DIAGNOSIS — Z01812 Encounter for preprocedural laboratory examination: Secondary | ICD-10-CM | POA: Insufficient documentation

## 2019-08-20 NOTE — Progress Notes (Signed)
PCP - [facility] Cardiologist - Nishan  Chest x-ray - no EKG - 04/14/19 Stress Test - no ECHO - 2015 Cardiac Cath - 2013, CABG 2013  Sleep Study -no  CPAP -   Fasting Blood Sugar - Pt doesn't test. Checks Blood Sugar _____ times a day  Blood Thinner Instructions:NA Aspirin Instructions: Last Dose:  Anesthesia review:   Patient denies shortness of breath, fever, cough and chest pain at PAT appointment  yes Patient verbalized understanding of instructions that were given to them at the PAT appointment. Patient was also instructed that they will need to review over the PAT instructions again at home before surgery. Yes. Info taken from Pt's daughter.

## 2019-08-21 ENCOUNTER — Encounter (HOSPITAL_COMMUNITY)
Admission: RE | Admit: 2019-08-21 | Discharge: 2019-08-21 | Disposition: A | Discharge status: Home / Self Care | Payer: Medicare HMO | Source: Ambulatory Visit | Attending: Urology | Admitting: Urology

## 2019-08-21 DIAGNOSIS — Z01812 Encounter for preprocedural laboratory examination: Secondary | ICD-10-CM | POA: Diagnosis not present

## 2019-08-21 LAB — CBC
HCT: 31.2 % — ABNORMAL LOW (ref 36.0–46.0)
Hemoglobin: 10 g/dL — ABNORMAL LOW (ref 12.0–15.0)
MCH: 26.9 pg (ref 26.0–34.0)
MCHC: 32.1 g/dL (ref 30.0–36.0)
MCV: 83.9 fL (ref 80.0–100.0)
Platelets: 463 10*3/uL — ABNORMAL HIGH (ref 150–400)
RBC: 3.72 MIL/uL — ABNORMAL LOW (ref 3.87–5.11)
RDW: 14.2 % (ref 11.5–15.5)
WBC: 12.4 10*3/uL — ABNORMAL HIGH (ref 4.0–10.5)
nRBC: 0 % (ref 0.0–0.2)

## 2019-08-21 LAB — BASIC METABOLIC PANEL
Anion gap: 9 (ref 5–15)
BUN: 38 mg/dL — ABNORMAL HIGH (ref 8–23)
CO2: 20 mmol/L — ABNORMAL LOW (ref 22–32)
Calcium: 9.9 mg/dL (ref 8.9–10.3)
Chloride: 97 mmol/L — ABNORMAL LOW (ref 98–111)
Creatinine, Ser: 2.58 mg/dL — ABNORMAL HIGH (ref 0.44–1.00)
GFR calc Af Amer: 18 mL/min — ABNORMAL LOW (ref >60)
GFR calc non Af Amer: 16 mL/min — ABNORMAL LOW (ref >60)
Glucose, Bld: 169 mg/dL — ABNORMAL HIGH (ref 70–99)
Potassium: 4.8 mmol/L (ref 3.5–5.1)
Sodium: 126 mmol/L — ABNORMAL LOW (ref 135–145)

## 2019-08-21 NOTE — Progress Notes (Signed)
Cr. : 2.58

## 2019-08-22 DIAGNOSIS — M25559 Pain in unspecified hip: Secondary | ICD-10-CM | POA: Diagnosis not present

## 2019-08-23 ENCOUNTER — Other Ambulatory Visit: Payer: Self-pay

## 2019-08-23 ENCOUNTER — Emergency Department (HOSPITAL_COMMUNITY): Payer: Medicare HMO

## 2019-08-23 ENCOUNTER — Inpatient Hospital Stay (HOSPITAL_COMMUNITY)
Admission: EM | Admit: 2019-08-23 | Discharge: 2019-09-06 | DRG: 657 | Disposition: A | Discharge status: Skilled Nursing Facility | Payer: Medicare HMO | Attending: Internal Medicine | Admitting: Internal Medicine

## 2019-08-23 ENCOUNTER — Other Ambulatory Visit (HOSPITAL_COMMUNITY)
Admission: RE | Admit: 2019-08-23 | Discharge: 2019-08-23 | Disposition: A | Discharge status: Home / Self Care | Payer: Medicare HMO | Source: Ambulatory Visit | Attending: Urology | Admitting: Urology

## 2019-08-23 ENCOUNTER — Encounter (HOSPITAL_COMMUNITY): Payer: Self-pay

## 2019-08-23 ENCOUNTER — Observation Stay (HOSPITAL_COMMUNITY): Payer: Medicare HMO

## 2019-08-23 DIAGNOSIS — E861 Hypovolemia: Secondary | ICD-10-CM | POA: Diagnosis not present

## 2019-08-23 DIAGNOSIS — I152 Hypertension secondary to endocrine disorders: Secondary | ICD-10-CM | POA: Diagnosis present

## 2019-08-23 DIAGNOSIS — C651 Malignant neoplasm of right renal pelvis: Secondary | ICD-10-CM | POA: Diagnosis not present

## 2019-08-23 DIAGNOSIS — I252 Old myocardial infarction: Secondary | ICD-10-CM

## 2019-08-23 DIAGNOSIS — E1122 Type 2 diabetes mellitus with diabetic chronic kidney disease: Secondary | ICD-10-CM

## 2019-08-23 DIAGNOSIS — D631 Anemia in chronic kidney disease: Secondary | ICD-10-CM | POA: Diagnosis present

## 2019-08-23 DIAGNOSIS — Z923 Personal history of irradiation: Secondary | ICD-10-CM

## 2019-08-23 DIAGNOSIS — I5022 Chronic systolic (congestive) heart failure: Secondary | ICD-10-CM | POA: Insufficient documentation

## 2019-08-23 DIAGNOSIS — R1084 Generalized abdominal pain: Secondary | ICD-10-CM | POA: Diagnosis not present

## 2019-08-23 DIAGNOSIS — Z515 Encounter for palliative care: Secondary | ICD-10-CM | POA: Diagnosis not present

## 2019-08-23 DIAGNOSIS — B961 Klebsiella pneumoniae [K. pneumoniae] as the cause of diseases classified elsewhere: Secondary | ICD-10-CM | POA: Diagnosis present

## 2019-08-23 DIAGNOSIS — N1832 Chronic kidney disease, stage 3b: Secondary | ICD-10-CM | POA: Diagnosis not present

## 2019-08-23 DIAGNOSIS — E1159 Type 2 diabetes mellitus with other circulatory complications: Secondary | ICD-10-CM | POA: Diagnosis present

## 2019-08-23 DIAGNOSIS — Z6825 Body mass index (BMI) 25.0-25.9, adult: Secondary | ICD-10-CM

## 2019-08-23 DIAGNOSIS — N1339 Other hydronephrosis: Secondary | ICD-10-CM

## 2019-08-23 DIAGNOSIS — E1165 Type 2 diabetes mellitus with hyperglycemia: Secondary | ICD-10-CM | POA: Diagnosis present

## 2019-08-23 DIAGNOSIS — Z66 Do not resuscitate: Secondary | ICD-10-CM | POA: Diagnosis present

## 2019-08-23 DIAGNOSIS — E860 Dehydration: Secondary | ICD-10-CM | POA: Diagnosis present

## 2019-08-23 DIAGNOSIS — N179 Acute kidney failure, unspecified: Secondary | ICD-10-CM

## 2019-08-23 DIAGNOSIS — I10 Essential (primary) hypertension: Secondary | ICD-10-CM | POA: Diagnosis not present

## 2019-08-23 DIAGNOSIS — D630 Anemia in neoplastic disease: Secondary | ICD-10-CM | POA: Diagnosis present

## 2019-08-23 DIAGNOSIS — K8020 Calculus of gallbladder without cholecystitis without obstruction: Secondary | ICD-10-CM | POA: Diagnosis present

## 2019-08-23 DIAGNOSIS — R109 Unspecified abdominal pain: Secondary | ICD-10-CM

## 2019-08-23 DIAGNOSIS — E871 Hypo-osmolality and hyponatremia: Secondary | ICD-10-CM | POA: Diagnosis not present

## 2019-08-23 DIAGNOSIS — E039 Hypothyroidism, unspecified: Secondary | ICD-10-CM | POA: Diagnosis present

## 2019-08-23 DIAGNOSIS — D649 Anemia, unspecified: Secondary | ICD-10-CM

## 2019-08-23 DIAGNOSIS — I130 Hypertensive heart and chronic kidney disease with heart failure and stage 1 through stage 4 chronic kidney disease, or unspecified chronic kidney disease: Secondary | ICD-10-CM | POA: Diagnosis present

## 2019-08-23 DIAGNOSIS — R627 Adult failure to thrive: Secondary | ICD-10-CM | POA: Diagnosis present

## 2019-08-23 DIAGNOSIS — Z951 Presence of aortocoronary bypass graft: Secondary | ICD-10-CM

## 2019-08-23 DIAGNOSIS — R410 Disorientation, unspecified: Secondary | ICD-10-CM | POA: Diagnosis not present

## 2019-08-23 DIAGNOSIS — R52 Pain, unspecified: Secondary | ICD-10-CM

## 2019-08-23 DIAGNOSIS — N1330 Unspecified hydronephrosis: Secondary | ICD-10-CM

## 2019-08-23 DIAGNOSIS — R590 Localized enlarged lymph nodes: Secondary | ICD-10-CM | POA: Diagnosis present

## 2019-08-23 DIAGNOSIS — C661 Malignant neoplasm of right ureter: Secondary | ICD-10-CM | POA: Diagnosis present

## 2019-08-23 DIAGNOSIS — B9620 Unspecified Escherichia coli [E. coli] as the cause of diseases classified elsewhere: Secondary | ICD-10-CM | POA: Diagnosis present

## 2019-08-23 DIAGNOSIS — Z20822 Contact with and (suspected) exposure to covid-19: Secondary | ICD-10-CM | POA: Insufficient documentation

## 2019-08-23 DIAGNOSIS — N183 Chronic kidney disease, stage 3 unspecified: Secondary | ICD-10-CM

## 2019-08-23 DIAGNOSIS — I2510 Atherosclerotic heart disease of native coronary artery without angina pectoris: Secondary | ICD-10-CM | POA: Diagnosis present

## 2019-08-23 DIAGNOSIS — Z96653 Presence of artificial knee joint, bilateral: Secondary | ICD-10-CM | POA: Diagnosis present

## 2019-08-23 DIAGNOSIS — Z8249 Family history of ischemic heart disease and other diseases of the circulatory system: Secondary | ICD-10-CM

## 2019-08-23 DIAGNOSIS — E1169 Type 2 diabetes mellitus with other specified complication: Secondary | ICD-10-CM | POA: Diagnosis present

## 2019-08-23 DIAGNOSIS — Z01812 Encounter for preprocedural laboratory examination: Secondary | ICD-10-CM | POA: Insufficient documentation

## 2019-08-23 DIAGNOSIS — R531 Weakness: Secondary | ICD-10-CM

## 2019-08-23 DIAGNOSIS — K5900 Constipation, unspecified: Secondary | ICD-10-CM | POA: Diagnosis present

## 2019-08-23 DIAGNOSIS — R296 Repeated falls: Secondary | ICD-10-CM | POA: Diagnosis present

## 2019-08-23 DIAGNOSIS — E785 Hyperlipidemia, unspecified: Secondary | ICD-10-CM | POA: Diagnosis present

## 2019-08-23 DIAGNOSIS — Z7189 Other specified counseling: Secondary | ICD-10-CM

## 2019-08-23 DIAGNOSIS — Z751 Person awaiting admission to adequate facility elsewhere: Secondary | ICD-10-CM

## 2019-08-23 DIAGNOSIS — R0902 Hypoxemia: Secondary | ICD-10-CM | POA: Diagnosis not present

## 2019-08-23 DIAGNOSIS — I255 Ischemic cardiomyopathy: Secondary | ICD-10-CM | POA: Diagnosis present

## 2019-08-23 DIAGNOSIS — N136 Pyonephrosis: Secondary | ICD-10-CM | POA: Diagnosis present

## 2019-08-23 DIAGNOSIS — C7951 Secondary malignant neoplasm of bone: Secondary | ICD-10-CM | POA: Diagnosis present

## 2019-08-23 LAB — SARS CORONAVIRUS 2 (TAT 6-24 HRS): SARS Coronavirus 2: NEGATIVE

## 2019-08-23 LAB — COMPREHENSIVE METABOLIC PANEL
ALT: 12 U/L (ref 0–44)
AST: 13 U/L — ABNORMAL LOW (ref 15–41)
Albumin: 3.2 g/dL — ABNORMAL LOW (ref 3.5–5.0)
Alkaline Phosphatase: 78 U/L (ref 38–126)
Anion gap: 11 (ref 5–15)
BUN: 40 mg/dL — ABNORMAL HIGH (ref 8–23)
CO2: 19 mmol/L — ABNORMAL LOW (ref 22–32)
Calcium: 9.8 mg/dL (ref 8.9–10.3)
Chloride: 99 mmol/L (ref 98–111)
Creatinine, Ser: 2.58 mg/dL — ABNORMAL HIGH (ref 0.44–1.00)
GFR calc Af Amer: 18 mL/min — ABNORMAL LOW (ref >60)
GFR calc non Af Amer: 16 mL/min — ABNORMAL LOW (ref >60)
Glucose, Bld: 120 mg/dL — ABNORMAL HIGH (ref 70–99)
Potassium: 4.9 mmol/L (ref 3.5–5.1)
Sodium: 129 mmol/L — ABNORMAL LOW (ref 135–145)
Total Bilirubin: 0.6 mg/dL (ref 0.3–1.2)
Total Protein: 6.6 g/dL (ref 6.5–8.1)

## 2019-08-23 LAB — CBC
HCT: 29.6 % — ABNORMAL LOW (ref 36.0–46.0)
Hemoglobin: 9.4 g/dL — ABNORMAL LOW (ref 12.0–15.0)
MCH: 26.7 pg (ref 26.0–34.0)
MCHC: 31.8 g/dL (ref 30.0–36.0)
MCV: 84.1 fL (ref 80.0–100.0)
Platelets: 413 10*3/uL — ABNORMAL HIGH (ref 150–400)
RBC: 3.52 MIL/uL — ABNORMAL LOW (ref 3.87–5.11)
RDW: 14.5 % (ref 11.5–15.5)
WBC: 12.1 10*3/uL — ABNORMAL HIGH (ref 4.0–10.5)
nRBC: 0 % (ref 0.0–0.2)

## 2019-08-23 LAB — LIPASE, BLOOD: Lipase: 16 U/L (ref 11–51)

## 2019-08-23 MED ORDER — ONDANSETRON HCL 4 MG/2ML IJ SOLN
4.0000 mg | Freq: Four times a day (QID) | INTRAMUSCULAR | Status: DC | PRN
  Administered 2019-08-26 – 2019-08-27 (×2): 4 mg via INTRAVENOUS
  Filled 2019-08-23 (×2): qty 2

## 2019-08-23 MED ORDER — SIMVASTATIN 40 MG PO TABS
40.0000 mg | ORAL_TABLET | Freq: Every evening | ORAL | Status: DC
  Administered 2019-08-24 – 2019-09-05 (×13): 40 mg via ORAL
  Filled 2019-08-23 (×13): qty 1

## 2019-08-23 MED ORDER — ACETAMINOPHEN 325 MG PO TABS
650.0000 mg | ORAL_TABLET | Freq: Four times a day (QID) | ORAL | Status: DC | PRN
  Administered 2019-08-27 – 2019-09-04 (×3): 650 mg via ORAL
  Filled 2019-08-23 (×3): qty 2

## 2019-08-23 MED ORDER — LEVOTHYROXINE SODIUM 25 MCG PO TABS
25.0000 ug | ORAL_TABLET | Freq: Every day | ORAL | Status: DC
  Administered 2019-08-24 – 2019-09-06 (×12): 25 ug via ORAL
  Filled 2019-08-23 (×13): qty 1

## 2019-08-23 MED ORDER — ACETAMINOPHEN 650 MG RE SUPP: 650.0000 mg | Freq: Four times a day (QID) | RECTAL | Status: DC | PRN

## 2019-08-23 MED ORDER — DARIFENACIN HYDROBROMIDE ER 7.5 MG PO TB24: 7.5000 mg | ORAL_TABLET | Freq: Every day | ORAL | Status: DC

## 2019-08-23 MED ORDER — CARVEDILOL 6.25 MG PO TABS
6.2500 mg | ORAL_TABLET | Freq: Two times a day (BID) | ORAL | Status: DC
  Administered 2019-08-24 – 2019-09-06 (×27): 6.25 mg via ORAL
  Filled 2019-08-23 (×27): qty 1

## 2019-08-23 MED ORDER — ONDANSETRON HCL 4 MG PO TABS
4.0000 mg | ORAL_TABLET | Freq: Four times a day (QID) | ORAL | Status: DC | PRN
  Administered 2019-08-30: 4 mg via ORAL
  Filled 2019-08-23: qty 1

## 2019-08-23 MED ORDER — HEPARIN SODIUM (PORCINE) 5000 UNIT/ML IJ SOLN
5000.0000 [IU] | Freq: Three times a day (TID) | INTRAMUSCULAR | Status: DC
  Administered 2019-08-24 – 2019-09-06 (×35): 5000 [IU] via SUBCUTANEOUS
  Filled 2019-08-23 (×37): qty 1

## 2019-08-23 MED ORDER — INSULIN ASPART 100 UNIT/ML ~~LOC~~ SOLN
0.0000 [IU] | Freq: Three times a day (TID) | SUBCUTANEOUS | Status: DC
  Filled 2019-08-23: qty 0.09

## 2019-08-23 MED ORDER — SODIUM CHLORIDE 0.9% FLUSH: 3.0000 mL | Freq: Once | INTRAVENOUS | Status: DC

## 2019-08-23 MED ORDER — SODIUM CHLORIDE 0.9 % IV SOLN: INTRAVENOUS | Status: AC

## 2019-08-23 MED ORDER — TRAMADOL HCL 50 MG PO TABS
50.0000 mg | ORAL_TABLET | Freq: Two times a day (BID) | ORAL | Status: DC | PRN
  Administered 2019-08-24 – 2019-09-03 (×8): 50 mg via ORAL
  Filled 2019-08-23 (×8): qty 1

## 2019-08-23 MED ORDER — SODIUM CHLORIDE 0.9 % IV BOLUS
1000.0000 mL | Freq: Once | INTRAVENOUS | Status: AC
  Administered 2019-08-23: 1000 mL via INTRAVENOUS

## 2019-08-23 NOTE — ED Triage Notes (Signed)
Per EMS, Pt has abd pain that has been going on for a month, that is being seen by PCP. Decided to call 911 today due to increased weakness. May have been confused this morning, but A&O 4x at the moment.

## 2019-08-23 NOTE — H&P (Signed)
History and Physical    [name] [id number] DOB: 1924-11-23 DOA: 08/23/2019  PCP: Scheryl Marten, PA  Patient coming from: Home  I have personally briefly reviewed patient's old medical records in [place]  Chief Complaint: Generalized weakness  HPI: [name] is a 85 y.o. female with medical history significant for CAD s/p CABG, ICM, chronic systolic CHF (EF 99991111 in 2015), CKD stage III, hypertension, hyperlipidemia, hypothyroidism, VSD s/p patch repair, type 2 diabetes, anemia of chronic disease, and history of thymoma s/p resection and radiation who presents to the ED for evaluation of generalized weakness.  Patient reports several days of decreased oral intake/appetite, feeling dehydrated and fatigued, and generalized weakness.  She has been having right flank and groin pain.  She reports an episode of nausea with vomiting earlier today which is now resolved.  She says she has had a difficult time safely ambulating and had a fall at home without significant injury.  She says she was taken off of her aspirin recently because of her increased fall risk.  She denies any dysuria, diarrhea, chest pain, dyspnea, cough, subjective fevers, chills, or diaphoresis.  She denies any obvious bleeding.  She has been following with urology as an outpatient and has plans for cystoscopy/ureteroscopy on 08/27/2019.  Preoperative labs on 08/21/2019 showed worsening renal function with creatinine 2.58 compared to baseline of 1.4 and hyponatremia of 126.  WBC was 12.4 and hemoglobin 10.0 compared to baseline hemoglobin of 11.  ED Course:  Initial vitals showed BP 141/52, pulse 86, RR 18, temp 98.8 Fahrenheit, SPO2 100% on room air.  Labs show sodium 129, potassium 4.9, bicarb 19, BUN 40, creatinine 2.58 (baseline 1.4), WBC 12.1, hemoglobin 9.4 (baseline 11), platelets 413,000, lipase 16.  Urinalysis is pending collection.  SARS-CoV-2 PCR performed earlier today is negative.  CT  renal stone study shows severe right hydronephrosis and perinephric stranding with dilated right ureter.  Increased soft tissue seen at the pelvic brim, cannot exclude ureteral or periureteral mass/neoplasm or stricture.  Per radiology read, similar appearance to recent [facility] study.  Patient was given 1 L normal saline and the hospitalist service was consulted to admit for further evaluation and management.  Review of Systems: All systems reviewed and are negative except as documented in history of present illness above.   Past Medical History:  Diagnosis Date  . Acute MI, anterior wall ([place])    a. 07/07/2011  . CAD (coronary artery disease)    a. 07/07/2011 anterior MI- Occluded LAD w/ emergent CABG x 2 (SVG->LAD->D1).  . 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 Radiation initiated 09/2011  . Hemorrhoid   . History of radiation therapy 09/05/11-10/12/11   thyoma  . HTN (hypertension)   . Hypothyroidism   . Ischemic cardiomyopathy    a. 07/17/11 Echo: EF 35%;  b. 09/19/2011 Echo: EF 30-35%, Mid-Dist AntSept & Apical AK, mild MR.  Small residual VSD.  Marland Kitchen Osteoarthritis of knee    Bilateral.  a. s/p LTKA ~2007;  b. s/p RTKA 10/2010  . Seasonal allergies   . Shortness of breath    recently SOB  . VSD (ventricular septal defect)    a.  07/07/2011 - Noted @ time of MI - s/p repair @ time of CABG;  b. 09/19/2011 Echo: small residula VSD (present on 2/11 echo also)    Past Surgical History:  Procedure Laterality Date  . ABDOMINAL HYSTERECTOMY    . CATARACT  EXTRACTION, BILATERAL    . CORONARY ARTERY BYPASS GRAFT  07/07/2011   Procedure: CORONARY ARTERY BYPASS GRAFTING (CABG);  Surgeon: Tharon Aquas Adelene Idler, MD;  Location: [place];  Service: Open Heart Surgery;  Laterality: N/A;  times two using greater saphenous vein harvested via endovascular vein harvest  . EYE SURGERY Bilateral    cataract  . HEMORRHOID SURGERY    . JOINT REPLACEMENT    . LEFT HEART  CATHETERIZATION WITH CORONARY ANGIOGRAM N/A 07/07/2011   Procedure: LEFT HEART CATHETERIZATION WITH CORONARY ANGIOGRAM;  Surgeon: Josue Hector, MD;  Location: [facility] CATH LAB;  Service: Cardiovascular;  Laterality: N/A;  . LUMBAR LAMINECTOMY/DECOMPRESSION MICRODISCECTOMY Left 08/19/2015   Procedure: Left Lumbar four-five Microdiskectomy;  Surgeon: Eustace Moore, MD;  Location: [place] NEURO ORS;  Service: Neurosurgery;  Laterality: Left;  left  . MASS EXCISION  07/07/2011   Procedure: EXCISION MASS;  Surgeon: Tharon Aquas Adelene Idler, MD;  Location: [place];  Service: Open Heart Surgery;  Laterality: N/A;  Excision of mediastinal mass  . MYRINGOTOMY WITH TUBE PLACEMENT Right 10/24/2012   Procedure: MYRINGOTOMY WITH TUBE PLACEMENT;  Surgeon: Melissa Montane, MD;  Location: [place];  Service: ENT;  Laterality: Right;  . NASAL ENDOSCOPY WITH EPISTAXIS CONTROL N/A 10/24/2012   Procedure: NASAL ENDOSCOPY WITH EPISTAXIS CONTROL;  Surgeon: Melissa Montane, MD;  Location: [place];  Service: ENT;  Laterality: N/A;  nasal endoscopy without epistaxis control... only need 30 minutes   . RIGHT HEART CATHETERIZATION  07/07/2011   Procedure: RIGHT HEART CATH;  Surgeon: Josue Hector, MD;  Location: [facility] C[facility] LAB;  Service: Cardiovascular;;  . TONSILLECTOMY    . TOTAL KNEE ARTHROPLASTY     a.  Left ~ 2007, Right 10/2010  . VSD REPAIR  07/07/2011   Procedure: VENTRICULAR SEPTAL DEFECT (VSD) REPAIR;  Surgeon: Len Childs, MD;  Location: [place];  Service: Open Heart Surgery;  Laterality: N/A;  with bovine pericardium 4cm x 4cm    Social History:  reports that she has never smoked. She has never used smokeless tobacco. She reports that she does not drink alcohol or use drugs.  Allergies  Allergen Reactions  . Amoxapine And Related Hives  . Clozapine Hives    Family History  Problem Relation Age of Onset  . Heart disease Mother      Prior to Admission medications   Medication Sig Start Date End Date Taking? Authorizing Provider    acetaminophen (TYLENOL) 325 MG tablet Take 2 tablets (650 mg total) by mouth every 6 (six) hours as needed for mild pain (or Fever >/= 101). 04/24/19   Raiford Noble Latif, DO  carvedilol (COREG) 6.25 MG tablet Take 1 tablet (6.25 mg total) by mouth 2 (two) times daily with a meal. 09/22/11   Theora Gianotti, NP  cholecalciferol (VITAMIN D) 1000 units tablet Take 1,000 Units by mouth daily.    [provider]  furosemide (LASIX) 20 MG tablet Take 20 mg by mouth daily as needed for edema.  09/18/16   [provider]  iron polysaccharides (NIFEREX) 150 MG capsule Take 1 capsule (150 mg total) by mouth daily. Patient not taking: Reported on 08/19/2019 04/24/19   Raiford Noble Latif, DO  levothyroxine (SYNTHROID, LEVOTHROID) 50 MCG tablet Take 25 mcg by mouth daily before breakfast.     [provider]  linagliptin (TRADJENTA) 5 MG TABS tablet Take 2.5 mg by mouth daily.     [provider]  losartan (COZAAR) 100 MG  tablet Take 100 mg by mouth daily. 10/26/14   [provider]  metFORMIN (GLUCOPHAGE) 500 MG tablet Take 500 mg by mouth 2 (two) times daily with a meal.  02/07/14   [provider]  Naproxen Sod-diphenhydrAMINE (ALEVE PM) 220-25 MG TABS Take 1 tablet by mouth at bedtime.    [provider]  omeprazole (PRILOSEC) 20 MG capsule Take 20 mg by mouth daily.  10/19/16   [provider]  ondansetron (ZOFRAN) 4 MG tablet Take 1 tablet (4 mg total) by mouth every 6 (six) hours as needed for nausea. 04/24/19   Raiford Noble Latif, DO  oxyCODONE (OXY IR/ROXICODONE) 5 MG immediate release tablet Take 1 tablet (5 mg total) by mouth every 4 (four) hours as needed for moderate pain. Patient not taking: Reported on 08/19/2019 04/24/19   Raiford Noble Latif, DO  polyethylene glycol (MIRALAX / GLYCOLAX) 17 g packet Take 17 g by mouth daily. Patient not taking: Reported on 08/19/2019 04/24/19   Raiford Noble Latif, DO  senna-docusate  (SENOKOT-S) 8.6-50 MG tablet Take 1 tablet by mouth at bedtime. Patient not taking: Reported on 08/19/2019 04/24/19   Raiford Noble Latif, DO  simvastatin (ZOCOR) 40 MG tablet Take 40 mg by mouth every evening.    [provider]  solifenacin (VESICARE) 5 MG tablet Take 5 mg by mouth daily. 06/09/19   [provider]  traMADol (ULTRAM) 50 MG tablet Take 50 mg by mouth 3 (three) times daily as needed for severe pain.  04/30/19   [provider]  pantoprazole (PROTONIX) 40 MG tablet Take 1 tablet (40 mg total) by mouth daily at 12 noon. 07/18/11 08/17/11  Suzzanne Cloud L, PA-C  ramipril (ALTACE) 5 MG capsule Take 1 capsule (5 mg total) by mouth daily. 07/31/11 08/17/11  Cathlyn Parsons, PA-C    Physical Exam: Vitals:   08/23/19 2105 08/23/19 2110 08/23/19 2345  BP:  (!) 141/52 (!) 134/59  Pulse:  90 93  Resp:  18 19  Temp:  98.8 F (37.1 C)   TempSrc:  Oral   SpO2: 97% 100% 99%   Constitutional: Elderly woman resting supine in bed, NAD, calm, appears fatigued Eyes: PERRL, lids and conjunctivae normal ENMT: Mucous membranes are dry. Posterior pharynx clear of any exudate or lesions. Neck: normal, supple, no masses. Respiratory: clear to auscultation bilaterally, no wheezing, no crackles. Normal respiratory effort. No accessory muscle use.  Cardiovascular: Regular rate and rhythm, no murmurs / rubs / gallops. No extremity edema. 2+ pedal pulses. Abdomen: Epigastric tenderness, no masses palpated. No hepatosplenomegaly. Bowel sounds positive.  Musculoskeletal: no clubbing / cyanosis. No joint deformity upper and lower extremities. No contractures. Normal muscle tone.  Skin: Skin is dry, no rashes, lesions, ulcers. No induration Neurologic: CN 2-12 grossly intact. Sensation intact, moving all extremities equally Psychiatric: Normal judgment and insight. Alert and oriented x 3. Normal mood.    Labs on Admission: I have personally reviewed following labs and imaging  studies  CBC: Recent Labs  Lab 08/21/19 1117 08/23/19 2158  WBC 12.4* 12.1*  HGB 10.0* 9.4*  HCT 31.2* 29.6*  MCV 83.9 84.1  PLT 463* 123XX123*   Basic Metabolic Panel: Recent Labs  Lab 08/21/19 1117 08/23/19 2158  NA 126* 129*  K 4.8 4.9  CL 97* 99  CO2 20* 19*  GLUCOSE 169* 120*  BUN 38* 40*  CREATININE 2.58* 2.58*  CALCIUM 9.9 9.8   GFR: Estimated Creatinine Clearance: 11.1 mL/min (A) (by C-G formula based on  SCr of 2.58 mg/dL (H)). Liver Function Tests: Recent Labs  Lab 08/23/19 2158  AST 13*  ALT 12  ALKPHOS 78  BILITOT 0.6  PROT 6.6  ALBUMIN 3.2*   Recent Labs  Lab 08/23/19 2158  LIPASE 16   No results for input(s): AMMONIA in the last 168 hours. Coagulation Profile: No results for input(s): INR, PROTIME in the last 168 hours. Cardiac Enzymes: No results for input(s): CKTOTAL, CKMB, CKMBINDEX, TROPONINI in the last 168 hours. BNP (last 3 results) No results for input(s): PROBNP in the last 8760 hours. HbA1C: No results for input(s): HGBA1C in the last 72 hours. CBG: No results for input(s): GLUCAP in the last 168 hours. Lipid Profile: No results for input(s): CHOL, HDL, LDLCALC, TRIG, CHOLHDL, LDLDIRECT in the last 72 hours. Thyroid Function Tests: No results for input(s): TSH, T4TOTAL, FREET4, T3FREE, THYROIDAB in the last 72 hours. Anemia Panel: No results for input(s): VITAMINB12, FOLATE, FERRITIN, TIBC, IRON, RETICCTPCT in the last 72 hours. Urine analysis:    Component Value Date/Time   COLORURINE YELLOW 07/19/2011 [place] 07/19/2011 1755   LABSPEC 1.015 07/19/2011 1755   PHURINE 7.0 07/19/2011 1755   GLUCOSEU NEGATIVE 07/19/2011 1755   HGBUR NEGATIVE 07/19/2011 1755   BILIRUBINUR NEGATIVE 07/19/2011 1755   KETONESUR NEGATIVE 07/19/2011 1755   PROTEINUR NEGATIVE 07/19/2011 1755   UROBILINOGEN 1.0 07/19/2011 1755   NITRITE NEGATIVE 07/19/2011 1755   LEUKOCYTESUR SMALL (A) 07/19/2011 1755    Radiological Exams on  Admission: CT Renal Stone Study  Result Date: 08/23/2019 CLINICAL DATA:  Abdominal pain, weakness EXAM: CT ABDOMEN AND PELVIS WITHOUT CONTRAST TECHNIQUE: Multidetector CT imaging of the abdomen and pelvis was performed following the standard protocol without IV contrast. COMPARISON:  08/08/2019 FINDINGS: Lower chest: Small right pleural effusion, decreased since prior study. Heart is normal size. Calcifications at a apex likely related to old infarct. Hepatobiliary: Layering gallstones within the gallbladder. No focal hepatic abnormality. Pancreas: No focal abnormality or ductal dilatation. Spleen: No focal abnormality.  Normal size. Adrenals/Urinary Tract: Severe right hydronephrosis with perinephric stranding. The right ureter is difficult to follow at the pelvic brim, but is decompressed distally. Soft tissue fullness in the region of the retroperitoneum at the pelvic brim. Cannot exclude Peri ureteral mass/neoplasm or stricture. The appearance is similar to prior study. Again, soft tissue fullness in the central right kidney, cannot exclude a midpole pararenal soft tissue mass, difficult to evaluate on this noncontrast study. No hydronephrosis on the left. Adrenal glands and urinary bladder unremarkable. Stomach/Bowel: Sigmoid diverticulosis. No active diverticulitis. No evidence of bowel obstruction. Moderate stool burden. Vascular/Lymphatic: Aortic atherosclerosis. No enlarged abdominal or pelvic lymph nodes. Reproductive: Prior hysterectomy.  No adnexal masses. Other: No free fluid or free air. Musculoskeletal: No acute bony abnormality. IMPRESSION: Again noted is severe right hydronephrosis and perinephric stranding. The right ureter is dilated to the pelvic brim where there is increased soft tissue. Cannot exclude ureteral or peri ureteral mass/neoplasm or stricture. This has a similar appearance to recent [facility] study. Soft tissue fullness in the midpole of the right kidney. Cannot exclude  mass. This cannot be characterized or evaluated on this noncontrast study. Cholelithiasis. Small right pleural effusion, decreased since prior study. Aortic atherosclerosis. Sigmoid diverticulosis. Electronically Signed   By: Rolm Baptise M.D.   On: 08/23/2019 22:30    EKG: Independently reviewed. Sinus rhythm, first-degree AV block, nonspecific ST changes leads III and V2.  ST changes more pronounced when compared to prior EKGs.  Assessment/Plan Principal Problem:   Acute renal failure superimposed on stage 3b chronic kidney disease (HCC) Active Problems:   Hypertension associated with diabetes ([place])   CAD (coronary artery disease)   Chronic systolic CHF (congestive heart failure) (HCC)   Type 2 diabetes mellitus with stage 3 chronic kidney disease (HCC)   Hyperlipidemia associated with type 2 diabetes mellitus (HCC)   Normocytic anemia   Hypothyroidism   Hydronephrosis of right kidney   Hyponatremia  [name] is a 86 y.o. female with medical history significant for CAD s/p CABG, ICM, chronic systolic CHF (EF 99991111 in 2015), CKD stage III, hypertension, hyperlipidemia, hypothyroidism, VSD s/p patch repair, type 2 diabetes, anemia of chronic disease, and history of thymoma s/p resection and radiation who is admitted with AKI.  AKI on CKD stage III Severe right-sided hydronephrosis: Creatinine 2.58 on admission compared to baseline of 1.4 in setting of poor oral intake/dehydration and medication effect.  CT renal stone study shows severe right-sided hydronephrosis and soft tissue fullness at the right kidney.  She has plan to undergo cystoscopy/ureteroscopy by urology on 08/27/2019. -Check urinalysis, urine sodium and urea -Obtain renal ultrasound for further evaluation -Start gentle IV fluid hydration with NS @ 100/hr overnight -Hold home naproxen and other NSAIDs, Lasix, Metformin, and losartan  Chronic systolic CHF/ischemic cardiomyopathy: EF 30-35% in 2015.  Appears volume  depleted. -IV fluids as above -Holding Lasix -Monitor strict I/O's and daily weights -Continue Coreg  Hyponatremia: Sodium 129 on admission.  On gentle fluids as above.  Hold Lasix and repeat labs in a.m.  Hypertension: Continue Coreg.  Holding Lasix and losartan with AKI.  Type 2 diabetes: Holding Metformin, continue sensitive SSI while in hospital.  Normocytic anemia: Hemoglobin 9.4, slightly decreased from baseline of 11.  No obvious bleeding.  Continue to monitor.  Hypothyroidism: -Continue Synthroid  CAD s/p CABG Hyperlipidemia: Currently denies any chest pain.  Patient states she was taken off aspirin in the last week due to recent falls.  Continue simvastatin.  Generalized weakness: -Request PT/OT eval   DVT prophylaxis: Subcutaneous heparin Code Status: DNR, confirmed with patient Family Communication: Discussed with patient, she has discussed with family Disposition Plan: From home, likely discharge to home pending improvement in renal function and PT/OT evaluations Consults called: None Admission status: Observation   Zada Finders MD [facility]  If 7PM-7AM, please contact night-coverage [email or web address]  08/23/2019, 11:50 PM

## 2019-08-23 NOTE — ED Triage Notes (Signed)
Patient is complaining of abdominal pain and general weakness. Patient has had abdominal pain for a month. They think she has cancer. Patient started having general weakness today.

## 2019-08-23 NOTE — ED Provider Notes (Addendum)
[facility] DEPT Provider Note   CSN: [id number] Arrival date & time: 08/23/19  2057     History Chief Complaint  Patient presents with  . Abdominal Pain  . Weakness    [name] is a 86 y.o. female.  Patient c/o generalized weakness in the past few days, and relatively poor po intake in the past few days. Symptoms gradual onset, moderate, persistent, felt worse today. Also c/o bilateral flank pain earlier today, moderate, dull, non radiating - currently improved. Had bm yesterday. Denies vomiting. No dysuria or hematuria. No fevers. Denies chest pain or discomfort. No sob. No cough or uri symptoms.   The history is provided by the patient.  Abdominal Pain Associated symptoms: nausea   Associated symptoms: no chest pain, no cough, no dysuria, no fever, no shortness of breath, no sore throat and no vomiting   Weakness Associated symptoms: abdominal pain and nausea   Associated symptoms: no chest pain, no cough, no dysuria, no fever, no headaches, no shortness of breath and no vomiting        Past Medical History:  Diagnosis Date  . Acute MI, anterior wall ([place])    a. 07/07/2011  . CAD (coronary artery disease)    a. 07/07/2011 anterior MI- Occluded LAD w/ emergent CABG x 2 (SVG->LAD->D1).  . 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 Radiation initiated 09/2011  . Hemorrhoid   . History of radiation therapy 09/05/11-10/12/11   thyoma  . HTN (hypertension)   . Hypothyroidism   . Ischemic cardiomyopathy    a. 07/17/11 Echo: EF 35%;  b. 09/19/2011 Echo: EF 30-35%, Mid-Dist AntSept & Apical AK, mild MR.  Small residual VSD.  Marland Kitchen Osteoarthritis of knee    Bilateral.  a. s/p LTKA ~2007;  b. s/p RTKA 10/2010  . Seasonal allergies   . Shortness of breath    recently SOB  . VSD (ventricular septal defect)    a.  07/07/2011 - Noted @ time of MI - s/p repair @ time of CABG;  b. 09/19/2011 Echo: small residula VSD (present on  2/11 echo also)    Patient Active Problem List   Diagnosis Date Noted  . Hip pain 04/22/2019  . Pain in left knee 06/07/2017  . Acute non-infective otitis externa of right ear 10/31/2016  . Impacted cerumen of both ears 12/27/2015  . Marginal perforation of tympanic membrane of right ear 12/27/2015  . Sensorineural hearing loss (SNHL), bilateral 12/27/2015  . S/P lumbar laminectomy 08/19/2015  . Ischemic cardiomyopathy   . CHF (congestive heart failure) ([place]) 08/18/2011  . Pleural effusion 08/18/2011  . Cellulitis 08/18/2011  . Dyspnea 08/17/2011  . 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 Physical deconditioning 07/19/2011  . Cancer ([place]) 07/08/2011  . Acute anterior wall MI ([place]) 07/07/2011  . Myocardial infarction ([place]) 07/07/2011  . HTN (hypertension)   . Osteoarthritis of knee   . CAD (coronary artery disease)   . VSD (ventricular septal defect)     Past Surgical History:  Procedure Laterality Date  . ABDOMINAL HYSTERECTOMY    . CATARACT EXTRACTION, BILATERAL    . CORONARY ARTERY BYPASS GRAFT  07/07/2011   Procedure: CORONARY ARTERY BYPASS GRAFTING (CABG);  Surgeon: Tharon Aquas Adelene Idler, MD;  Location: [facility];  Service: Open Heart Surgery;  Laterality: N/A;  times two using greater saphenous vein harvested via endovascular vein harvest  . EYE SURGERY Bilateral    cataract  .  HEMORRHOID SURGERY    . JOINT REPLACEMENT    . LEFT HEART CATHETERIZATION WITH CORONARY ANGIOGRAM N/A 07/07/2011   Procedure: LEFT HEART CATHETERIZATION WITH CORONARY ANGIOGRAM;  Surgeon: Josue Hector, MD;  Location: [facility] CATH LAB;  Service: Cardiovascular;  Laterality: N/A;  . LUMBAR LAMINECTOMY/DECOMPRESSION MICRODISCECTOMY Left 08/19/2015   Procedure: Left Lumbar four-five Microdiskectomy;  Surgeon: Eustace Moore, MD;  Location: [facility] NEURO ORS;  Service: Neurosurgery;  Laterality: Left;  left  . MASS EXCISION  07/07/2011   Procedure: EXCISION MASS;  Surgeon: Tharon Aquas Adelene Idler, MD;   Location: [place];  Service: Open Heart Surgery;  Laterality: N/A;  Excision of mediastinal mass  . MYRINGOTOMY WITH TUBE PLACEMENT Right 10/24/2012   Procedure: MYRINGOTOMY WITH TUBE PLACEMENT;  Surgeon: Melissa Montane, MD;  Location: [facility];  Service: ENT;  Laterality: Right;  . NASAL ENDOSCOPY WITH EPISTAXIS CONTROL N/A 10/24/2012   Procedure: NASAL ENDOSCOPY WITH EPISTAXIS CONTROL;  Surgeon: Melissa Montane, MD;  Location: [place];  Service: ENT;  Laterality: N/A;  nasal endoscopy without epistaxis control... only need 30 minutes   . RIGHT HEART CATHETERIZATION  07/07/2011   Procedure: RIGHT HEART CATH;  Surgeon: Josue Hector, MD;  Location[facility] LAB;  Service: Cardiovascular;;  . TONSILLECTOMY    . TOTAL KNEE ARTHROPLASTY     a.  Left ~ 2007, Right 10/2010  . VSD REPAIR  07/07/2011   Procedure: VENTRICULAR SEPTAL DEFECT (VSD) REPAIR;  Surgeon: Len Childs, MD;  Location: [facility];  Service: Open Heart Surgery;  Laterality: N/A;  with bovine pericardium 4cm x 4cm     OB History   No obstetric history on file.     Family History  Problem Relation Age of Onset  . Heart disease Mother     Social History   Tobacco Use  . Smoking status: Never Smoker  . Smokeless tobacco: Never Used  Substance Use Topics  . Alcohol use: No  . Drug use: No    Home Medications Prior to Admission medications   Medication Sig Start Date End Date Taking? Authorizing Provider  acetaminophen (TYLENOL) 325 MG tablet Take 2 tablets (650 mg total) by mouth every 6 (six) hours as needed for mild pain (or Fever >/= 101). 04/24/19   Raiford Noble Latif, DO  carvedilol (COREG) 6.25 MG tablet Take 1 tablet (6.25 mg total) by mouth 2 (two) times daily with a meal. 09/22/11   Theora Gianotti, NP  cholecalciferol (VITAMIN D) 1000 units tablet Take 1,000 Units by mouth daily.    [provider]  furosemide (LASIX) 20 MG tablet Take 20 mg by mouth daily as needed for edema.  09/18/16   [provider]  iron polysaccharides (NIFEREX) 150 MG capsule Take 1 capsule (150 mg total) by mouth daily. Patient not taking: Reported on 08/19/2019 04/24/19   Raiford Noble Latif, DO  levothyroxine (SYNTHROID, LEVOTHROID) 50 MCG tablet Take 25 mcg by mouth daily before breakfast.     [provider]  linagliptin (TRADJENTA) 5 MG TABS tablet Take 2.5 mg by mouth daily.     [provider]  losartan (COZAAR) 100 MG tablet Take 100 mg by mouth daily. 10/26/14   [provider]  metFORMIN (GLUCOPHAGE) 500 MG tablet Take 500 mg by mouth 2 (two) times daily with a meal.  02/07/14   [provider]  Naproxen Sod-diphenhydrAMINE (ALEVE PM) 220-25 MG TABS Take 1 tablet by mouth at bedtime.    [provider]  omeprazole (PRILOSEC) 20 MG capsule Take 20 mg by mouth daily.  10/19/16   [provider]  ondansetron (ZOFRAN) 4 MG tablet Take 1 tablet (4 mg total) by mouth every 6 (six) hours as needed for nausea. 04/24/19   Raiford Noble Latif, DO  oxyCODONE (OXY IR/ROXICODONE) 5 MG immediate release tablet Take 1 tablet (5 mg total) by mouth every 4 (four) hours as needed for moderate pain. Patient not taking: Reported on 08/19/2019 04/24/19   Raiford Noble Latif, DO  polyethylene glycol (MIRALAX / GLYCOLAX) 17 g packet Take 17 g by mouth daily. Patient not taking: Reported on 08/19/2019 04/24/19   Raiford Noble Latif, DO  senna-docusate (SENOKOT-S) 8.6-50 MG tablet Take 1 tablet by mouth at bedtime. Patient not taking: Reported on 08/19/2019 04/24/19   Raiford Noble Latif, DO  simvastatin (ZOCOR) 40 MG tablet Take 40 mg by mouth every evening.    [provider]  solifenacin (VESICARE) 5 MG tablet Take 5 mg by mouth daily. 06/09/19   [provider]  traMADol (ULTRAM) 50 MG tablet Take 50 mg by mouth 3 (three) times daily as needed for severe pain.  04/30/19   [provider]  pantoprazole (PROTONIX) 40 MG tablet Take 1 tablet (40 mg total) by  mouth daily at 12 noon. 07/18/11 08/17/11  Suzzanne Cloud L, PA-C  ramipril (ALTACE) 5 MG capsule Take 1 capsule (5 mg total) by mouth daily. 07/31/11 08/17/11  Angiulli, Lavon Paganini, PA-C    Allergies    Amoxapine and related and Clozapine  Review of Systems   Review of Systems  Constitutional: Negative for fever.  HENT: Negative for sore throat.   Eyes: Negative for redness.  Respiratory: Negative for cough and shortness of breath.   Cardiovascular: Negative for chest pain.  Gastrointestinal: Positive for abdominal pain and nausea. Negative for vomiting.  Endocrine: Negative for polyuria.  Genitourinary: Positive for flank pain. Negative for dysuria.  Musculoskeletal: Negative for neck pain.  Skin: Negative for rash.  Neurological: Positive for weakness. Negative for headaches.  Hematological: Does not bruise/bleed easily.  Psychiatric/Behavioral: Negative for confusion.    Physical Exam Updated Vital Signs BP (!) 141/52 (BP Location: Left Arm)   Pulse 90   Temp 98.8 F (37.1 C) (Oral)   Resp 18   SpO2 100%   Physical Exam Vitals and nursing note reviewed.  Constitutional:      Appearance: Normal appearance. She is well-developed.  HENT:     Head: Atraumatic.     Nose: Nose normal.     Mouth/Throat:     Mouth: Mucous membranes are moist.  Eyes:     General: No scleral icterus.    Conjunctiva/sclera: Conjunctivae normal.     Pupils: Pupils are equal, round, and reactive to light.  Neck:     Trachea: No tracheal deviation.  Cardiovascular:     Rate and Rhythm: Normal rate and regular rhythm.     Pulses: Normal pulses.     Heart sounds: Normal heart sounds. No murmur. No friction rub. No gallop.   Pulmonary:     Effort: Pulmonary effort is normal. No respiratory distress.     Breath sounds: Normal breath sounds.  Abdominal:     General: Bowel sounds are normal. There is no distension.     Palpations: Abdomen is soft. There is no mass.     Tenderness: There is no  abdominal tenderness. There is no guarding or rebound.     Hernia: No  hernia is present.  Genitourinary:    Comments: No cva tenderness.  Musculoskeletal:        General: No swelling or tenderness.     Cervical back: Normal range of motion and neck supple. No rigidity. No muscular tenderness.  Skin:    General: Skin is warm and dry.     Findings: No rash.  Neurological:     Mental Status: She is alert.     Comments: Alert, speech normal. Motor/sens grossly intact bil.   Psychiatric:        Mood and Affect: Mood normal.     ED Results / Procedures / Treatments   Labs (all labs ordered are listed, but only abnormal results are displayed) Results for orders placed or performed during the hospital encounter of 08/23/19  Lipase, blood  Result Value Ref Range   Lipase 16 11 - 51 U/L  Comprehensive metabolic panel  Result Value Ref Range   Sodium 129 (L) 135 - 145 mmol/L   Potassium 4.9 3.5 - 5.1 mmol/L   Chloride 99 98 - 111 mmol/L   CO2 19 (L) 22 - 32 mmol/L   Glucose, Bld 120 (H) 70 - 99 mg/dL   BUN 40 (H) 8 - 23 mg/dL   Creatinine, Ser 2.58 (H) 0.44 - 1.00 mg/dL   Calcium 9.8 8.9 - 10.3 mg/dL   Total Protein 6.6 6.5 - 8.1 g/dL   Albumin 3.2 (L) 3.5 - 5.0 g/dL   AST 13 (L) 15 - 41 U/L   ALT 12 0 - 44 U/L   Alkaline Phosphatase 78 38 - 126 U/L   Total Bilirubin 0.6 0.3 - 1.2 mg/dL   GFR calc non Af Amer 16 (L) >60 mL/min   GFR calc Af Amer 18 (L) >60 mL/min   Anion gap 11 5 - 15  CBC  Result Value Ref Range   WBC 12.1 (H) 4.0 - 10.5 K/uL   RBC 3.52 (L) 3.87 - 5.11 MIL/uL   Hemoglobin 9.4 (L) 12.0 - 15.0 g/dL   HCT 29.6 (L) 36.0 - 46.0 %   MCV 84.1 80.0 - 100.0 fL   MCH 26.7 26.0 - 34.0 pg   MCHC 31.8 30.0 - 36.0 g/dL   RDW 14.5 11.5 - 15.5 %   Platelets 413 (H) 150 - 400 K/uL   nRBC 0.0 0.0 - 0.2 %   CT LUMBAR SPINE WO CONTRAST  Result Date: 08/01/2019 CLINICAL DATA:  Rule out lumbar fracture L4. Back pain. History of fall. EXAM: CT LUMBAR SPINE WITHOUT CONTRAST  TECHNIQUE: Multidetector CT imaging of the lumbar spine was performed without intravenous contrast administration. Multiplanar CT image reconstructions were also generated. COMPARISON:  Lumbar MRI 04/22/2019. CT lumbar 03/25/2015 FINDINGS: Segmentation: Normal Alignment: Mild retrolisthesis L1-2 and L2-3. 6.5 mm anterolisthesis L4-5. 4 mm anterolisthesis L5-S1. Vertebrae: Fracture of the inferior endplate of L1 on the right which appears acute. Likely associated Schmorl's node. No Schmorl's node or fracture seen on the prior studies. No other lumbar fracture Chronic bilateral pars defects of L5. No other acute fracture Paraspinal and other soft tissues: Right pleural effusion. Atherosclerotic aorta without aneurysm. Gallstones. Right hydronephrosis and hydroureter, incompletely evaluated. Possible distal stricture or stone. Sigmoid diverticulosis. Disc levels: T12-L1: Mild disc degeneration without stenosis. L1-2: Moderate disc degeneration with disc bulging and mild spinal stenosis. Mild facet degeneration and mild retrolisthesis. L2-3: Disc degeneration with diffuse disc bulging and endplate spurring. Moderate facet and ligamentum flavum hypertrophy. Severe spinal stenosis and severe subarticular stenosis bilaterally  unchanged from the prior MRI. L3-4: Advanced disc degeneration with disc space narrowing and endplate spurring. Bilateral facet hypertrophy. Mild spinal stenosis and mild subarticular stenosis bilaterally L5-S1: Severe facet degeneration bilaterally. Probable left laminotomy. Mild spinal stenosis. Moderate subarticular stenosis bilaterally. L5-S1: 6 mm anterolisthesis with chronic bilateral pars defects of L5. Moderate subarticular stenosis bilaterally. IMPRESSION: 1. Fracture of the inferior endplate of L1 on the right which appears acute. No other acute fracture. 2. Right hydronephrosis and hydroureter, incompletely evaluated on this study. Possible distal stricture or stone. 3. Cholelithiasis. 4.  Advanced multilevel degenerative change in the lumbar spine with multiple levels of stenosis as described above. Aortic Atherosclerosis (ICD10-I70.0). Electronically Signed   By: Franchot Gallo M.D.   On: 08/01/2019 11:19   CT HIP RIGHT WO CONTRAST  Result Date: 08/01/2019 CLINICAL DATA:  Multiple recent falls. Evaluate for hip fracture. EXAM: CT OF THE RIGHT HIP WITHOUT CONTRAST TECHNIQUE: Multidetector CT imaging of the right hip was performed according to the standard protocol. Multiplanar CT image reconstructions were also generated. COMPARISON:  MRI left hip dated April 22, 2019. Left hip x-rays dated April 22, 2019. FINDINGS: Bones/Joint/Cartilage No fracture or dislocation. Mild to moderate right hip osteoarthritis with joint space narrowing, bulky marginal osteophytes, and a few intra-articular bodies measuring up to 1.0 cm. No joint effusion. Chondrocalcinosis of the labrum. Osteopenia. Severe lower lumbar facet arthropathy with chronic bilateral L5 pars defects. Ligaments Ligaments are suboptimally evaluated by CT. Muscles and Tendons Grossly intact. Soft tissue No fluid collection or hematoma. No soft tissue mass. Indistinctness of the inferior aspect of the right kidney is likely related to motion artifact. IMPRESSION: 1. No acute osseous abnormality. 2. Mild to moderate right hip osteoarthritis. Electronically Signed   By: Titus Dubin M.D.   On: 08/01/2019 11:15   CT Renal Stone Study  Result Date: 08/23/2019 CLINICAL DATA:  Abdominal pain, weakness EXAM: CT ABDOMEN AND PELVIS WITHOUT CONTRAST TECHNIQUE: Multidetector CT imaging of the abdomen and pelvis was performed following the standard protocol without IV contrast. COMPARISON:  08/08/2019 FINDINGS: Lower chest: Small right pleural effusion, decreased since prior study. Heart is normal size. Calcifications at a apex likely related to old infarct. Hepatobiliary: Layering gallstones within the gallbladder. No focal hepatic  abnormality. Pancreas: No focal abnormality or ductal dilatation. Spleen: No focal abnormality.  Normal size. Adrenals/Urinary Tract: Severe right hydronephrosis with perinephric stranding. The right ureter is difficult to follow at the pelvic brim, but is decompressed distally. Soft tissue fullness in the region of the retroperitoneum at the pelvic brim. Cannot exclude Peri ureteral mass/neoplasm or stricture. The appearance is similar to prior study. Again, soft tissue fullness in the central right kidney, cannot exclude a midpole pararenal soft tissue mass, difficult to evaluate on this noncontrast study. No hydronephrosis on the left. Adrenal glands and urinary bladder unremarkable. Stomach/Bowel: Sigmoid diverticulosis. No active diverticulitis. No evidence of bowel obstruction. Moderate stool burden. Vascular/Lymphatic: Aortic atherosclerosis. No enlarged abdominal or pelvic lymph nodes. Reproductive: Prior hysterectomy.  No adnexal masses. Other: No free fluid or free air. Musculoskeletal: No acute bony abnormality. IMPRESSION: Again noted is severe right hydronephrosis and perinephric stranding. The right ureter is dilated to the pelvic brim where there is increased soft tissue. Cannot exclude ureteral or peri ureteral mass/neoplasm or stricture. This has a similar appearance to recent [facility] study. Soft tissue fullness in the midpole of the right kidney. Cannot exclude mass. This cannot be characterized or evaluated on this noncontrast study. Cholelithiasis. Small right pleural  effusion, decreased since prior study. Aortic atherosclerosis. Sigmoid diverticulosis. Electronically Signed   By: Rolm Baptise M.D.   On: 08/23/2019 22:30    EKG EKG Interpretation  Date/Time:  Saturday August 23 2019 21:56:58 EDT Ventricular Rate:  85 PR Interval:    QRS Duration: 84 QT Interval:  353 QTC Calculation: 420 R Axis:   -28 Text Interpretation: Sinus rhythm Atrial premature complex Prolonged PR  interval Non-specific ST-t changes `st/t changes similar to prior ecgs Confirmed by Lajean Saver [phone] on 08/23/2019 10:41:22 PM   Radiology CT Renal Stone Study  Result Date: 08/23/2019 CLINICAL DATA:  Abdominal pain, weakness EXAM: CT ABDOMEN AND PELVIS WITHOUT CONTRAST TECHNIQUE: Multidetector CT imaging of the abdomen and pelvis was performed following the standard protocol without IV contrast. COMPARISON:  08/08/2019 FINDINGS: Lower chest: Small right pleural effusion, decreased since prior study. Heart is normal size. Calcifications at a apex likely related to old infarct. Hepatobiliary: Layering gallstones within the gallbladder. No focal hepatic abnormality. Pancreas: No focal abnormality or ductal dilatation. Spleen: No focal abnormality.  Normal size. Adrenals/Urinary Tract: Severe right hydronephrosis with perinephric stranding. The right ureter is difficult to follow at the pelvic brim, but is decompressed distally. Soft tissue fullness in the region of the retroperitoneum at the pelvic brim. Cannot exclude Peri ureteral mass/neoplasm or stricture. The appearance is similar to prior study. Again, soft tissue fullness in the central right kidney, cannot exclude a midpole pararenal soft tissue mass, difficult to evaluate on this noncontrast study. No hydronephrosis on the left. Adrenal glands and urinary bladder unremarkable. Stomach/Bowel: Sigmoid diverticulosis. No active diverticulitis. No evidence of bowel obstruction. Moderate stool burden. Vascular/Lymphatic: Aortic atherosclerosis. No enlarged abdominal or pelvic lymph nodes. Reproductive: Prior hysterectomy.  No adnexal masses. Other: No free fluid or free air. Musculoskeletal: No acute bony abnormality. IMPRESSION: Again noted is severe right hydronephrosis and perinephric stranding. The right ureter is dilated to the pelvic brim where there is increased soft tissue. Cannot exclude ureteral or peri ureteral mass/neoplasm or stricture. This  has a similar appearance to recent [facility] study. Soft tissue fullness in the midpole of the right kidney. Cannot exclude mass. This cannot be characterized or evaluated on this noncontrast study. Cholelithiasis. Small right pleural effusion, decreased since prior study. Aortic atherosclerosis. Sigmoid diverticulosis. Electronically Signed   By: Rolm Baptise M.D.   On: 08/23/2019 22:30    Procedures Procedures (including critical care time)  Medications Ordered in ED Medications  sodium chloride flush (NS) 0.9 % injection 3 mL (has no administration in time range)    ED Course  I have reviewed the triage vital signs and the nursing notes.  Pertinent labs & imaging results that were available during my care of the patient were reviewed by me and considered in my medical decision making (see chart for details).    MDM Rules/Calculators/A&P                      Iv ns bolus. Labs sent. Imaging ordered.   Reviewed nursing notes and prior charts for additional history.   Labs reviewed/interpreted by me - na low. hco3 low, aki - suspect dehydration. It appears pts baseline creatinine is 1.4, although recent outpatient pre-admission testing bun/cr similar to today.  Iv ns bolus.   CT reviewed/interpreted by me -  Persistent severe right hydronephrosis.   Given general weakness, altered mental status, abd/flank pain, aki compared to baseline, dehydration, etc - will admit. hospitalists consulted for admission. Tomorrow  urology can be consulted, and perhaps planned procedure arranged while in hospital.   UA pending, if positive for uti, add abc coverage.     Final Clinical Impression(s) / ED Diagnoses Final diagnoses:  None    Rx / DC Orders ED Discharge Orders    None           Lajean Saver, MD 08/23/19 [id number]

## 2019-08-24 DIAGNOSIS — Z7401 Bed confinement status: Secondary | ICD-10-CM | POA: Diagnosis not present

## 2019-08-24 DIAGNOSIS — C7951 Secondary malignant neoplasm of bone: Secondary | ICD-10-CM | POA: Diagnosis not present

## 2019-08-24 DIAGNOSIS — R488 Other symbolic dysfunctions: Secondary | ICD-10-CM | POA: Diagnosis not present

## 2019-08-24 DIAGNOSIS — B9620 Unspecified Escherichia coli [E. coli] as the cause of diseases classified elsewhere: Secondary | ICD-10-CM | POA: Diagnosis present

## 2019-08-24 DIAGNOSIS — E1169 Type 2 diabetes mellitus with other specified complication: Secondary | ICD-10-CM

## 2019-08-24 DIAGNOSIS — R531 Weakness: Secondary | ICD-10-CM | POA: Diagnosis not present

## 2019-08-24 DIAGNOSIS — N2889 Other specified disorders of kidney and ureter: Secondary | ICD-10-CM | POA: Diagnosis not present

## 2019-08-24 DIAGNOSIS — N1330 Unspecified hydronephrosis: Secondary | ICD-10-CM | POA: Diagnosis not present

## 2019-08-24 DIAGNOSIS — I2583 Coronary atherosclerosis due to lipid rich plaque: Secondary | ICD-10-CM | POA: Diagnosis not present

## 2019-08-24 DIAGNOSIS — Z79899 Other long term (current) drug therapy: Secondary | ICD-10-CM | POA: Diagnosis not present

## 2019-08-24 DIAGNOSIS — E1122 Type 2 diabetes mellitus with diabetic chronic kidney disease: Secondary | ICD-10-CM | POA: Diagnosis not present

## 2019-08-24 DIAGNOSIS — N189 Chronic kidney disease, unspecified: Secondary | ICD-10-CM | POA: Diagnosis not present

## 2019-08-24 DIAGNOSIS — N183 Chronic kidney disease, stage 3 unspecified: Secondary | ICD-10-CM | POA: Diagnosis not present

## 2019-08-24 DIAGNOSIS — Z515 Encounter for palliative care: Secondary | ICD-10-CM | POA: Diagnosis not present

## 2019-08-24 DIAGNOSIS — E039 Hypothyroidism, unspecified: Secondary | ICD-10-CM | POA: Diagnosis not present

## 2019-08-24 DIAGNOSIS — E785 Hyperlipidemia, unspecified: Secondary | ICD-10-CM

## 2019-08-24 DIAGNOSIS — R410 Disorientation, unspecified: Secondary | ICD-10-CM | POA: Diagnosis not present

## 2019-08-24 DIAGNOSIS — E1159 Type 2 diabetes mellitus with other circulatory complications: Secondary | ICD-10-CM

## 2019-08-24 DIAGNOSIS — E861 Hypovolemia: Secondary | ICD-10-CM | POA: Diagnosis not present

## 2019-08-24 DIAGNOSIS — Z66 Do not resuscitate: Secondary | ICD-10-CM | POA: Diagnosis not present

## 2019-08-24 DIAGNOSIS — C801 Malignant (primary) neoplasm, unspecified: Secondary | ICD-10-CM | POA: Diagnosis not present

## 2019-08-24 DIAGNOSIS — E860 Dehydration: Secondary | ICD-10-CM | POA: Diagnosis present

## 2019-08-24 DIAGNOSIS — N179 Acute kidney failure, unspecified: Secondary | ICD-10-CM | POA: Diagnosis not present

## 2019-08-24 DIAGNOSIS — I10 Essential (primary) hypertension: Secondary | ICD-10-CM

## 2019-08-24 DIAGNOSIS — I5022 Chronic systolic (congestive) heart failure: Secondary | ICD-10-CM

## 2019-08-24 DIAGNOSIS — Z20822 Contact with and (suspected) exposure to covid-19: Secondary | ICD-10-CM | POA: Diagnosis not present

## 2019-08-24 DIAGNOSIS — R1311 Dysphagia, oral phase: Secondary | ICD-10-CM | POA: Diagnosis not present

## 2019-08-24 DIAGNOSIS — M6281 Muscle weakness (generalized): Secondary | ICD-10-CM | POA: Diagnosis not present

## 2019-08-24 DIAGNOSIS — D631 Anemia in chronic kidney disease: Secondary | ICD-10-CM | POA: Diagnosis present

## 2019-08-24 DIAGNOSIS — D4111 Neoplasm of uncertain behavior of right renal pelvis: Secondary | ICD-10-CM | POA: Diagnosis not present

## 2019-08-24 DIAGNOSIS — N1832 Chronic kidney disease, stage 3b: Secondary | ICD-10-CM | POA: Diagnosis not present

## 2019-08-24 DIAGNOSIS — Z7189 Other specified counseling: Secondary | ICD-10-CM | POA: Diagnosis not present

## 2019-08-24 DIAGNOSIS — E1165 Type 2 diabetes mellitus with hyperglycemia: Secondary | ICD-10-CM | POA: Diagnosis present

## 2019-08-24 DIAGNOSIS — C659 Malignant neoplasm of unspecified renal pelvis: Secondary | ICD-10-CM | POA: Diagnosis not present

## 2019-08-24 DIAGNOSIS — I130 Hypertensive heart and chronic kidney disease with heart failure and stage 1 through stage 4 chronic kidney disease, or unspecified chronic kidney disease: Secondary | ICD-10-CM | POA: Diagnosis not present

## 2019-08-24 DIAGNOSIS — R2689 Other abnormalities of gait and mobility: Secondary | ICD-10-CM | POA: Diagnosis not present

## 2019-08-24 DIAGNOSIS — I959 Hypotension, unspecified: Secondary | ICD-10-CM | POA: Diagnosis not present

## 2019-08-24 DIAGNOSIS — B961 Klebsiella pneumoniae [K. pneumoniae] as the cause of diseases classified elsewhere: Secondary | ICD-10-CM | POA: Diagnosis present

## 2019-08-24 DIAGNOSIS — C651 Malignant neoplasm of right renal pelvis: Secondary | ICD-10-CM | POA: Diagnosis not present

## 2019-08-24 DIAGNOSIS — I2510 Atherosclerotic heart disease of native coronary artery without angina pectoris: Secondary | ICD-10-CM | POA: Diagnosis not present

## 2019-08-24 DIAGNOSIS — R52 Pain, unspecified: Secondary | ICD-10-CM | POA: Diagnosis not present

## 2019-08-24 DIAGNOSIS — R0902 Hypoxemia: Secondary | ICD-10-CM | POA: Diagnosis not present

## 2019-08-24 DIAGNOSIS — D649 Anemia, unspecified: Secondary | ICD-10-CM | POA: Diagnosis not present

## 2019-08-24 DIAGNOSIS — C661 Malignant neoplasm of right ureter: Secondary | ICD-10-CM | POA: Diagnosis present

## 2019-08-24 DIAGNOSIS — D630 Anemia in neoplastic disease: Secondary | ICD-10-CM | POA: Diagnosis present

## 2019-08-24 DIAGNOSIS — E871 Hypo-osmolality and hyponatremia: Secondary | ICD-10-CM | POA: Diagnosis not present

## 2019-08-24 DIAGNOSIS — M2550 Pain in unspecified joint: Secondary | ICD-10-CM | POA: Diagnosis not present

## 2019-08-24 DIAGNOSIS — C641 Malignant neoplasm of right kidney, except renal pelvis: Secondary | ICD-10-CM | POA: Diagnosis not present

## 2019-08-24 DIAGNOSIS — N136 Pyonephrosis: Secondary | ICD-10-CM | POA: Diagnosis not present

## 2019-08-24 LAB — URINALYSIS, ROUTINE W REFLEX MICROSCOPIC
Bilirubin Urine: NEGATIVE
Glucose, UA: NEGATIVE mg/dL
Ketones, ur: 5 mg/dL — AB
Nitrite: NEGATIVE
Protein, ur: NEGATIVE mg/dL
Specific Gravity, Urine: 1.012 (ref 1.005–1.030)
pH: 5 (ref 5.0–8.0)

## 2019-08-24 LAB — BASIC METABOLIC PANEL
Anion gap: 9 (ref 5–15)
BUN: 36 mg/dL — ABNORMAL HIGH (ref 8–23)
CO2: 20 mmol/L — ABNORMAL LOW (ref 22–32)
Calcium: 9.5 mg/dL (ref 8.9–10.3)
Chloride: 103 mmol/L (ref 98–111)
Creatinine, Ser: 2.31 mg/dL — ABNORMAL HIGH (ref 0.44–1.00)
GFR calc Af Amer: 21 mL/min — ABNORMAL LOW (ref >60)
GFR calc non Af Amer: 18 mL/min — ABNORMAL LOW (ref >60)
Glucose, Bld: 127 mg/dL — ABNORMAL HIGH (ref 70–99)
Potassium: 4.8 mmol/L (ref 3.5–5.1)
Sodium: 132 mmol/L — ABNORMAL LOW (ref 135–145)

## 2019-08-24 LAB — CBC
HCT: 28.3 % — ABNORMAL LOW (ref 36.0–46.0)
Hemoglobin: 8.8 g/dL — ABNORMAL LOW (ref 12.0–15.0)
MCH: 26.2 pg (ref 26.0–34.0)
MCHC: 31.1 g/dL (ref 30.0–36.0)
MCV: 84.2 fL (ref 80.0–100.0)
Platelets: 371 10*3/uL (ref 150–400)
RBC: 3.36 MIL/uL — ABNORMAL LOW (ref 3.87–5.11)
RDW: 14.6 % (ref 11.5–15.5)
WBC: 11.5 10*3/uL — ABNORMAL HIGH (ref 4.0–10.5)
nRBC: 0 % (ref 0.0–0.2)

## 2019-08-24 LAB — SODIUM, URINE, RANDOM: Sodium, Ur: 67 mmol/L

## 2019-08-24 LAB — HEMOGLOBIN A1C
Hgb A1c MFr Bld: 6.6 % — ABNORMAL HIGH (ref 4.8–5.6)
Mean Plasma Glucose: 142.72 mg/dL

## 2019-08-24 LAB — GLUCOSE, CAPILLARY
Glucose-Capillary: 100 mg/dL — ABNORMAL HIGH (ref 70–99)
Glucose-Capillary: 97 mg/dL (ref 70–99)
Glucose-Capillary: 129 mg/dL — ABNORMAL HIGH (ref 70–99)
Glucose-Capillary: 109 mg/dL — ABNORMAL HIGH (ref 70–99)

## 2019-08-24 MED ORDER — SODIUM CHLORIDE 0.9 % IV SOLN
1.0000 g | INTRAVENOUS | Status: DC
  Administered 2019-08-24 – 2019-08-27 (×4): 1 g via INTRAVENOUS
  Filled 2019-08-24 (×2): qty 10
  Filled 2019-08-24 (×2): qty 1
  Filled 2019-08-24: qty 10

## 2019-08-24 MED ORDER — SODIUM CHLORIDE 0.9 % IV SOLN: INTRAVENOUS | Status: AC

## 2019-08-24 MED ORDER — SENNOSIDES-DOCUSATE SODIUM 8.6-50 MG PO TABS
1.0000 | ORAL_TABLET | Freq: Every day | ORAL | Status: DC
  Administered 2019-08-24 – 2019-09-06 (×13): 1 via ORAL
  Filled 2019-08-24 (×13): qty 1

## 2019-08-24 MED ORDER — ORAL CARE MOUTH RINSE
15.0000 mL | Freq: Two times a day (BID) | OROMUCOSAL | Status: DC
  Administered 2019-08-24 – 2019-09-06 (×24): 15 mL via OROMUCOSAL

## 2019-08-24 MED ORDER — POLYETHYLENE GLYCOL 3350 17 G PO PACK
17.0000 g | PACK | Freq: Every day | ORAL | Status: DC
  Administered 2019-08-24 – 2019-09-06 (×13): 17 g via ORAL
  Filled 2019-08-24 (×13): qty 1

## 2019-08-24 MED ORDER — LIP MEDEX EX OINT
TOPICAL_OINTMENT | CUTANEOUS | Status: DC | PRN
  Filled 2019-08-24 (×3): qty 7

## 2019-08-24 NOTE — Progress Notes (Signed)
PROGRESS NOTE  [name]  [id number] DOB: 02/07/1925 DOA: 08/23/2019 PCP: Scheryl Marten, PA   Brief Narrative: [name] is a 86 y.o. female with medical history significant for CAD s/p CABG, ICM, chronic systolic CHF (EF 99991111 in 2015), CKD stage III, hypertension, hyperlipidemia, hypothyroidism, VSD s/p patch repair, type 2 diabetes, anemia of chronic disease, and history of thymoma s/p resection and radiation who presented to the ED for generalized weakness and decreased po intake for the previous week. She has been following with urology as an outpatient and has plans for cystoscopy/ureteroscopy on 08/27/2019.  Preoperative labs on 08/21/2019 showed worsening renal function with creatinine 2.58 compared to baseline of 1.4 and hyponatremia of 126.  WBC was 12.4 and hemoglobin 10.0 compared to baseline hemoglobin of 11.  Assessment & Plan: Principal Problem:   Acute renal failure superimposed on stage 3b chronic kidney disease (HCC) Active Problems:   Hypertension associated with diabetes ([place])   CAD (coronary artery disease)   Chronic systolic CHF (congestive heart failure) (HCC)   Type 2 diabetes mellitus with stage 3 chronic kidney disease (HCC)   Hyperlipidemia associated with type 2 diabetes mellitus (HCC)   Normocytic anemia   Hypothyroidism   Hydronephrosis of right kidney   Hyponatremia   AKI (acute kidney injury) ([place])  AKI on CKD stage III: Due likely to right-sided obstruction, moderate right hydronephrosis, in addition to prerenal azotemia from dehydration. CrCl remains <82ml/min despite modest improvement, not near baseline. Renal U/S suggests minimal chronic component with normal echogenicity. Urinalysis without protein or RBCs on micro. - Renally dose medications - Minimize sedating medications (d/w family and pt) - Avoid nephrotoxins including diuretic, NSAIDs, ARB, metformin.  - Restart IV fluids given poor per oral intake.   Right hydronephrosis,  right ureteral obstruction:  - Consulted urology, Dr. Diona Fanti. Recommends continuing with plan to undergo cystoscopy/ureteroscopy with Dr. Tresa Moore 08/27/2019.  - Due to pyuria, some bacteriuria, will give ceftriaxone pending urine culture (has been ordered).  Chronic HFrEF, ICM, CAD s/p CABG: Functionally quite independent with good exercise tolerance up until the fall about a month ago. No current chest pain.  - No recent echocardiogram in the system, but last LVEF 30-35%. Holding diuretic and ARB due to volume depletion. Will require close, daily monitoring of volume status, I/O (difficult due to incontinence), daily weights.  - No longer on ASA due to falls/bleeding risk. Continue BB and statin.  Hyponatremia: Improved with IVF.  - Continue isotonic saline for today and monitor. Urine Na 67.   Hypertension: - Coreg continued, will continue monitoring. Hold ARB, diuretic as above.  NIDT2DM: Very well controlled, possibly too tight for her level of frailty (HbA1c 6.6%).  - Holding metformin. Can stop SSI  Normocytic anemia: Hemoglobin 9.4, slightly decreased from baseline of 11.  No obvious bleeding.  Continue to monitor.  Hypothyroidism: -Continue Synthroid  HLD:  - Continue statin.   Generalized weakness: Multifactorial.  - Continue PT/OT, hydration.   Constipation:  - Start senna-ducosate and miralax. Abd currently benign, will require continued narcotic pain medications.  DVT prophylaxis: Heparin [place] Code Status: DNR Family Communication: Daughter at bedside, son by speaker phone as well. Request palliative care consult after procedure. Disposition Plan: Patient admitted from home due to weakness with AKI and poor per oral intake. Oral intake remains inadequate so she will require IV fluids. This is a very sensitive clinical situation with elderly patient with heart failure. Will require laboratory monitoring of hyponatremia, renal failure,  Consultants:    Urology  Procedures:   None  Antimicrobials:  None   Subjective: Pain is well controlled this morning ~8 hours after receiving tramadol. Very weak, requiring significant assist getting to bedside chair. Pt lives with daughter who is not able to provide that level of support. No chest pain or dyspnea or swelling.   Objective: Vitals:   08/24/19 0106 08/24/19 0546 08/24/19 1224 08/24/19 1353  BP: (!) 147/87 129/63 (!) 128/56 (!) 102/35  Pulse: 99 100 87 84  Resp: 16 14 16 16   Temp: 98 F (36.7 C) 98.7 F (37.1 C)  (!) 97.5 F (36.4 C)  TempSrc: Oral Oral  Oral  SpO2: 98% 96% 97% 99%  Weight:   58.5 kg   Height:   5' (1.524 m)     Intake/Output Summary (Last 24 hours) at 08/24/2019 1444 Last data filed at 08/24/2019 [id number] Gross per 24 hour  Intake 1700 ml  Output --  Net 1700 ml   Filed Weights   08/24/19 1224  Weight: 58.5 kg    Gen: Pleasant, elderly female in no distress Pulm: Non-labored breathing room air. No crackles/clear to auscultation bilaterally.  CV: Regular rate and rhythm. No murmur, rub, or gallop. No JVD, no pedal edema. GI: Abdomen soft, tender in right quadrants, non-distended, with normoactive bowel sounds. No organomegaly or masses felt. Ext: Warm, no deformities Skin: No rashes, lesions or ulcers on visualized skin. Neuro: Alert and oriented. No focal neurological deficits. Psych: Judgement and insight appear normal. Mood & affect appropriate.   Data Reviewed: I have personally reviewed following labs and imaging studies  CBC: Recent Labs  Lab 08/21/19 1117 08/23/19 2158 08/24/19 0530  WBC 12.4* 12.1* 11.5*  HGB 10.0* 9.4* 8.8*  HCT 31.2* 29.6* 28.3*  MCV 83.9 84.1 84.2  PLT 463* 413* [id number]   Basic Metabolic Panel: Recent Labs  Lab 08/21/19 1117 08/23/19 2158 08/24/19 0530  NA 126* 129* 132*  K 4.8 4.9 4.8  CL 97* 99 103  CO2 20* 19* 20*  GLUCOSE 169* 120* 127*  BUN 38* 40* 36*  CREATININE 2.58* 2.58* 2.31*  CALCIUM 9.9 9.8  9.5   Liver Function Tests: Recent Labs  Lab 08/23/19 2158  AST 13*  ALT 12  ALKPHOS 78  BILITOT 0.6  PROT 6.6  ALBUMIN 3.2*   Recent Labs  Lab 08/23/19 2158  LIPASE 16   HbA1C: Recent Labs    08/23/19 2158  HGBA1C 6.6*   CBG: Recent Labs  Lab 08/24/19 0737 08/24/19 1140  GLUCAP 100* 97   Urine analysis:    Component Value Date/Time   COLORURINE YELLOW 08/23/2019 2102   APPEARANCEUR HAZY (A) 08/23/2019 2102   LABSPEC 1.012 08/23/2019 2102   PHURINE 5.0 08/23/2019 2102   GLUCOSEU NEGATIVE 08/23/2019 2102   HGBUR SMALL (A) 08/23/2019 2102   BILIRUBINUR NEGATIVE 08/23/2019 2102   KETONESUR 5 (A) 08/23/2019 2102   PROTEINUR NEGATIVE 08/23/2019 2102   UROBILINOGEN 1.0 07/19/2011 1755   NITRITE NEGATIVE 08/23/2019 2102   LEUKOCYTESUR MODERATE (A) 08/23/2019 2102   Recent Results (from the past 240 hour(s))  SARS CORONAVIRUS 2 (TAT 6-24 HRS) Nasopharyngeal Nasopharyngeal Swab     Status: None   Collection Time: 08/23/19  1:21 PM   Specimen: Nasopharyngeal Swab  Result Value Ref Range Status   SARS Coronavirus 2 NEGATIVE NEGATIVE Final    Comment: (NOTE) SARS-CoV-2 target nucleic acids are NOT DETECTED. The SARS-CoV-2 RNA is generally detectable in upper and lower respiratory specimens  during the acute phase of infection. Negative results do not preclude SARS-CoV-2 infection, do not rule out co-infections with other pathogens, and should not be used as the sole basis for treatment or other patient management decisions. Negative results must be combined with clinical observations, patient history, and epidemiological information. The expected result is Negative. Fact Sheet for Patients: SugarRoll.be Fact Sheet for Healthcare Providers: https://www.woods-mathews.com/ This test is not yet approved or cleared by the [place] FDA and  has been authorized for detection and/or diagnosis of SARS-CoV-2 by FDA under an  Emergency Use Authorization (EUA). This EUA will remain  in effect (meaning this test can be used) for the duration of the COVID-19 declaration under Section 56 4(b)(1) of the Act, 21 U.S.C. section 360bbb-3(b)(1), unless the authorization is terminated or revoked sooner. Performed at [facility], [place] [address], [place], [place] [address]       Radiology Studies: US RENAL  Result Date: 08/24/2019 CLINICAL DATA:  Chronic kidney disease, acute renal failure EXAM: RENAL / URINARY TRACT ULTRASOUND COMPLETE COMPARISON:  CT 08/23/2019 FINDINGS: Right Kidney: Renal measurements: 11.4 x 5.7 x 6.9 cm = volume: 235 mL. Moderate right hydronephrosis as seen on CT. Normal echotexture. No visible renal mass. Left Kidney: Renal measurements: 7.9 x 4.3 x 2.3 cm = volume: 92 mL. Echogenicity within normal limits. No mass or hydronephrosis visualized. Bladder: Appears normal for degree of bladder distention. Other: None. IMPRESSION: Moderate right hydronephrosis.  No visible renal mass. Electronically Signed   By: Rolm Baptise M.D.   On: 08/24/2019 00:51   CT Renal Stone Study  Result Date: 08/23/2019 CLINICAL DATA:  Abdominal pain, weakness EXAM: CT ABDOMEN AND PELVIS WITHOUT CONTRAST TECHNIQUE: Multidetector CT imaging of the abdomen and pelvis was performed following the standard protocol without IV contrast. COMPARISON:  08/08/2019 FINDINGS: Lower chest: Small right pleural effusion, decreased since prior study. Heart is normal size. Calcifications at a apex likely related to old infarct. Hepatobiliary: Layering gallstones within the gallbladder. No focal hepatic abnormality. Pancreas: No focal abnormality or ductal dilatation. Spleen: No focal abnormality.  Normal size. Adrenals/Urinary Tract: Severe right hydronephrosis with perinephric stranding. The right ureter is difficult to follow at the pelvic brim, but is decompressed distally. Soft tissue fullness in the region of the retroperitoneum at the  pelvic brim. Cannot exclude Peri ureteral mass/neoplasm or stricture. The appearance is similar to prior study. Again, soft tissue fullness in the central right kidney, cannot exclude a midpole pararenal soft tissue mass, difficult to evaluate on this noncontrast study. No hydronephrosis on the left. Adrenal glands and urinary bladder unremarkable. Stomach/Bowel: Sigmoid diverticulosis. No active diverticulitis. No evidence of bowel obstruction. Moderate stool burden. Vascular/Lymphatic: Aortic atherosclerosis. No enlarged abdominal or pelvic lymph nodes. Reproductive: Prior hysterectomy.  No adnexal masses. Other: No free fluid or free air. Musculoskeletal: No acute bony abnormality. IMPRESSION: Again noted is severe right hydronephrosis and perinephric stranding. The right ureter is dilated to the pelvic brim where there is increased soft tissue. Cannot exclude ureteral or peri ureteral mass/neoplasm or stricture. This has a similar appearance to recent [facility] study. Soft tissue fullness in the midpole of the right kidney. Cannot exclude mass. This cannot be characterized or evaluated on this noncontrast study. Cholelithiasis. Small right pleural effusion, decreased since prior study. Aortic atherosclerosis. Sigmoid diverticulosis. Electronically Signed   By: Rolm Baptise M.D.   On: 08/23/2019 22:30    Scheduled Meds: . carvedilol  6.25 mg Oral BID WC  . heparin  5,000 Units Subcutaneous Q8H  . insulin aspart  0-9 Units Subcutaneous TID WC  . levothyroxine  25 mcg Oral QAC breakfast  . mouth rinse  15 mL Mouth Rinse BID  . simvastatin  40 mg Oral QPM  . sodium chloride flush  3 mL Intravenous Once   Continuous Infusions: . sodium chloride 100 mL/hr at 08/24/19 1010     LOS: 0 days   Time spent: 35 minutes.  Patrecia Pour, MD [facility] [email or web address] 08/24/2019, 2:44 PM

## 2019-08-24 NOTE — Progress Notes (Signed)
MD, Please update patient's daughter, [name].  Patient is very [place] and forgetful at times. Her daughter's number is [phone].  Also, please consult Dr. Tresa Moore with urology. Patient is scheduled for a biopsy on Wednesday. She is supposed to be taking antibiotic pills for 3 days prior to procedure per daughter.Roderick Pee

## 2019-08-24 NOTE — Evaluation (Signed)
Physical Therapy Evaluation Patient Details Name: [name] MRN: [id number] DOB: 26-Oct-1924 Today's Date: 08/24/2019   History of Present Illness  [name] is a 86 y.o. female with medical history significant for CAD s/p CABG, ICM, chronic systolic CHF (EF 99991111 in 2015), CKD stage III, hypertension, hyperlipidemia, hypothyroidism, VSD s/p patch repair, type 2 diabetes, anemia of chronic disease, and history of thymoma s/p resection and radiation who presents to the ED for evaluation of generalized weakness.  Clinical Impression  Pt admitted as above and presenting with functional mobility limitations 2* generalized weakness, balance deficits, poor endurance and questionable safety awareness.  Pt hopes to progress to dc home with dtr and would benefit from follow up HHPT to further address strength/balance deficits.    Follow Up Recommendations Home health PT    Equipment Recommendations  None recommended by PT    Recommendations for Other Services       Precautions / Restrictions Precautions Precautions: Fall Restrictions Weight Bearing Restrictions: No      Mobility  Bed Mobility Overal bed mobility: Needs Assistance Bed Mobility: Supine to Sit     Supine to sit: Min assist;+2 for physical assistance;+2 for safety/equipment     General bed mobility comments: cues to initiate transfer for BLEs and support at trunk to come to sitting. Use of bed pad to square hips to EOB  Transfers Overall transfer level: Needs assistance Equipment used: Rolling walker (2 wheeled) Transfers: Sit to/from Stand Sit to Stand: Min assist;+2 physical assistance;+2 safety/equipment         General transfer comment: min A +2 from side of bed, pt +dizziness with standing that decreased with rest  Ambulation/Gait Ambulation/Gait assistance: Min assist;+2 safety/equipment Gait Distance (Feet): 5 Feet Assistive device: Rolling walker (2 wheeled) Gait Pattern/deviations: Step-to  pattern;Decreased step length - right;Decreased step length - left;Shuffle;Trunk flexed Gait velocity: decr   General Gait Details: cues for posture and position from RW; distance ltd by pt fatigue  Stairs            Wheelchair Mobility    Modified Rankin (Stroke Patients Only)       Balance Overall balance assessment: Needs assistance Sitting-balance support: Bilateral upper extremity supported;Feet unsupported Sitting balance-Leahy Scale: Fair     Standing balance support: Bilateral upper extremity supported;During functional activity Standing balance-Leahy Scale: Poor Standing balance comment: reliant on therapist assist and UE support                             Pertinent Vitals/Pain Pain Assessment: No/denies pain    Home Living Family/patient expects to be discharged to:: Private residence Living Arrangements: Children Available Help at Discharge: Family;Available PRN/intermittently Type of Home: House Home Access: Ramped entrance     Home Layout: One level Home Equipment: Walker - 2 wheels;Walker - 4 wheels;Shower seat;Wheelchair - manual;Bedside commode      Prior Function Level of Independence: Needs assistance   Gait / Transfers Assistance Needed: uses RW to ambulate  ADL's / Homemaking Assistance Needed: assist from daughter for BADL And IADL  Comments: daughter provides supervision for bathing/dressing     Hand Dominance        Extremity/Trunk Assessment   Upper Extremity Assessment Upper Extremity Assessment: Generalized weakness    Lower Extremity Assessment Lower Extremity Assessment: Generalized weakness    Cervical / Trunk Assessment Cervical / Trunk Assessment: Kyphotic  Communication   Communication: HOH  Cognition Arousal/Alertness: Awake/alert Behavior During  Therapy: WFL for tasks assessed/performed Overall Cognitive Status: Impaired/Different from baseline Area of Impairment: Memory                      Memory: Decreased short-term memory         General Comments: cues for STM, difficulty recalling some home set up information. Pt also very HOH making full assessment difficult      General Comments      Exercises     Assessment/Plan    PT Assessment Patient needs continued PT services  PT Problem List Decreased strength;Decreased activity tolerance;Decreased balance;Decreased mobility;Decreased knowledge of use of DME;Decreased safety awareness       PT Treatment Interventions DME instruction;Gait training;Functional mobility training;Therapeutic activities;Therapeutic exercise;Balance training;Patient/family education    PT Goals (Current goals can be found in the Care Plan section)  Acute Rehab PT Goals Patient Stated Goal: return home PT Goal Formulation: With patient Time For Goal Achievement: 09/07/19 Potential to Achieve Goals: Good    Frequency Min 3X/week   Barriers to discharge        Co-evaluation PT/OT/SLP Co-Evaluation/Treatment: Yes Reason for Co-Treatment: For patient/therapist safety PT goals addressed during session: Mobility/safety with mobility OT goals addressed during session: ADL's and self-care       AM-PAC PT "6 Clicks" Mobility  Outcome Measure Help needed turning from your back to your side while in a flat bed without using bedrails?: A Little Help needed moving from lying on your back to sitting on the side of a flat bed without using bedrails?: A Little Help needed moving to and from a bed to a chair (including a wheelchair)?: A Little Help needed standing up from a chair using your arms (e.g., wheelchair or bedside chair)?: A Little Help needed to walk in hospital room?: A Little Help needed climbing 3-5 steps with a railing? : A Lot 6 Click Score: 17    End of Session Equipment Utilized During Treatment: Gait belt Activity Tolerance: Patient limited by fatigue Patient left: in chair;with call bell/phone within reach;with  chair alarm set;with family/visitor present Nurse Communication: Mobility status PT Visit Diagnosis: Unsteadiness on feet (R26.81);Muscle weakness (generalized) (M62.81);Difficulty in walking, not elsewhere classified (R26.2)    Time: 1205-1232 PT Time Calculation (min) (ACUTE ONLY): 27 min   Charges:   PT Evaluation $PT Eval Low Complexity: 1 Low          [place] Pager [phone] Office [phone]   [facility] 08/24/2019, 3:49 PM

## 2019-08-24 NOTE — Consult Note (Signed)
Urology Consult   Physician requesting consult: Vance Gather. MD  Reason for consult: Ureteral mass, pain.  History of Present Illness: [name] is a 86 y.o. female scheduled for ureteroscopic biopsy of a right ureteral mass in 3 days.  She was recently admitted for bilateral abdominal pain and just feeling bad.  She does have a bump in creatinine-baseline in November, 2020--1.4.  It was 2.58 on 3/18, now 2.31.  She is not having gross hematuria.  She does have stable leakage/overactive bladder symptoms.  GU requested to follow-up while in hospital and consider more urgent procedure.   Past Medical History:  Diagnosis Date  . Acute MI, anterior wall ([place])    a. 07/07/2011  . CAD (coronary artery disease)    a. 07/07/2011 anterior MI- Occluded LAD w/ emergent CABG x 2 (SVG->LAD->D1).  . 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 Radiation initiated 09/2011  . Hemorrhoid   . History of radiation therapy 09/05/11-10/12/11   thyoma  . HTN (hypertension)   . Hypothyroidism   . Ischemic cardiomyopathy    a. 07/17/11 Echo: EF 35%;  b. 09/19/2011 Echo: EF 30-35%, Mid-Dist AntSept & Apical AK, mild MR.  Small residual VSD.  Marland Kitchen Osteoarthritis of knee    Bilateral.  a. s/p LTKA ~2007;  b. s/p RTKA 10/2010  . Seasonal allergies   . Shortness of breath    recently SOB  . VSD (ventricular septal defect)    a.  07/07/2011 - Noted @ time of MI - s/p repair @ time of CABG;  b. 09/19/2011 Echo: small residula VSD (present on 2/11 echo also)    Past Surgical History:  Procedure Laterality Date  . ABDOMINAL HYSTERECTOMY    . CATARACT EXTRACTION, BILATERAL    . CORONARY ARTERY BYPASS GRAFT  07/07/2011   Procedure: CORONARY ARTERY BYPASS GRAFTING (CABG);  Surgeon: Tharon Aquas Adelene Idler, MD;  Location: [facility];  Service: Open Heart Surgery;  Laterality: N/A;  times two using greater saphenous vein harvested via endovascular vein harvest  . EYE SURGERY Bilateral    cataract  .  HEMORRHOID SURGERY    . JOINT REPLACEMENT    . LEFT HEART CATHETERIZATION WITH CORONARY ANGIOGRAM N/A 07/07/2011   Procedure: LEFT HEART CATHETERIZATION WITH CORONARY ANGIOGRAM;  Surgeon: Josue Hector, MD;  Location: [facility] CATH LAB;  Service: Cardiovascular;  Laterality: N/A;  . LUMBAR LAMINECTOMY/DECOMPRESSION MICRODISCECTOMY Left 08/19/2015   Procedure: Left Lumbar four-five Microdiskectomy;  Surgeon: Eustace Moore, MD;  Location: [facility];  Service: Neurosurgery;  Laterality: Left;  left  . MASS EXCISION  07/07/2011   Procedure: EXCISION MASS;  Surgeon: Tharon Aquas Adelene Idler, MD;  Location: [facility];  Service: Open Heart Surgery;  Laterality: N/A;  Excision of mediastinal mass  . MYRINGOTOMY WITH TUBE PLACEMENT Right 10/24/2012   Procedure: MYRINGOTOMY WITH TUBE PLACEMENT;  Surgeon: Melissa Montane, MD;  Location: [facility];  Service: ENT;  Laterality: Right;  . NASAL ENDOSCOPY WITH EPISTAXIS CONTROL N/A 10/24/2012   Procedure: NASAL ENDOSCOPY WITH EPISTAXIS CONTROL;  Surgeon: Melissa Montane, MD;  Location: [facility];  Service: ENT;  Laterality: N/A;  nasal endoscopy without epistaxis control... only need 30 minutes   . RIGHT HEART CATHETERIZATION  07/07/2011   Procedure: RIGHT HEART CATH;  Surgeon: Josue Hector, MD;  Location: [facility] CATH LAB;  Service: Cardiovascular;;  . TONSILLECTOMY    . TOTAL KNEE ARTHROPLASTY     a.  Left ~ 2007, Right  10/2010  . VSD REPAIR  07/07/2011   Procedure: VENTRICULAR SEPTAL DEFECT (VSD) REPAIR;  Surgeon: Len Childs, MD;  Location: [facility];  Service: Open Heart Surgery;  Laterality: N/A;  with bovine pericardium 4cm x 4cm     Current Hospital Medications: Scheduled Meds: . carvedilol  6.25 mg Oral BID WC  . heparin  5,000 Units Subcutaneous Q8H  . insulin aspart  0-9 Units Subcutaneous TID WC  . levothyroxine  25 mcg Oral QAC breakfast  . mouth rinse  15 mL Mouth Rinse BID  . simvastatin  40 mg Oral QPM  . sodium chloride flush  3 mL Intravenous Once   Continuous  Infusions: . sodium chloride     PRN Meds:.acetaminophen **OR** acetaminophen, lip balm, ondansetron **OR** ondansetron (ZOFRAN) IV, traMADol  Allergies:  Allergies  Allergen Reactions  . Amoxapine And Related Hives  . Clozapine Hives    Family History  Problem Relation Age of Onset  . Heart disease Mother     Social History:  reports that she has never smoked. She has never used smokeless tobacco. She reports that she does not drink alcohol or use drugs.  ROS: She does complain of bilateral lower quadrant pain.  She has no current nausea.  She denies gross hematuria.  Physical Exam:  Vital signs in last 24 hours: Temp:  [98 F (36.7 C)-98.8 F (37.1 C)] 98.7 F (37.1 C) (03/21 0546) Pulse Rate:  [90-100] 100 (03/21 0546) Resp:  [14-19] 14 (03/21 0546) BP: (129-147)/(52-87) 129/63 (03/21 0546) SpO2:  [96 %-100 %] 96 % (03/21 0546) General: Alert, memory somewhat lacking HEENT: Normocephalic, atraumatic Neck: No JVD or lymphadenopathy Cardiovascular: Regular rate Lungs: Normal inspiratory/expiratory excursion Abdomen: Soft, minimal right lower quadrant tenderness Extremities: No edema Neurologic: Grossly intact  Laboratory Data:  Recent Labs    08/21/19 1117 08/23/19 2158 08/24/19 0530  WBC 12.4* 12.1* 11.5*  HGB 10.0* 9.4* 8.8*  HCT 31.2* 29.6* 28.3*  PLT 463* 413* 371    Recent Labs    08/21/19 1117 08/23/19 2158 08/24/19 0530  NA 126* 129* 132*  K 4.8 4.9 4.8  CL 97* 99 103  GLUCOSE 169* 120* 127*  BUN 38* 40* 36*  CALCIUM 9.9 9.8 9.5  CREATININE 2.58* 2.58* 2.31*     Results for orders placed or performed during the hospital encounter of 08/23/19 (from the past 24 hour(s))  Urinalysis, Routine w reflex microscopic     Status: Abnormal   Collection Time: 08/23/19  9:02 PM  Result Value Ref Range   Color, Urine YELLOW YELLOW   APPearance HAZY (A) CLEAR   Specific Gravity, Urine 1.012 1.005 - 1.030   pH 5.0 5.0 - 8.0   Glucose, UA NEGATIVE  NEGATIVE mg/dL   Hgb urine dipstick SMALL (A) NEGATIVE   Bilirubin Urine NEGATIVE NEGATIVE   Ketones, ur 5 (A) NEGATIVE mg/dL   Protein, ur NEGATIVE NEGATIVE mg/dL   Nitrite NEGATIVE NEGATIVE   Leukocytes,Ua MODERATE (A) NEGATIVE   RBC / HPF 0-5 0 - 5 RBC/hpf   WBC, UA 21-50 0 - 5 WBC/hpf   Bacteria, UA FEW (A) NONE SEEN   Squamous Epithelial / LPF 0-5 0 - 5   Ca Oxalate Crys, UA PRESENT   Lipase, blood     Status: None   Collection Time: 08/23/19  9:58 PM  Result Value Ref Range   Lipase 16 11 - 51 U/L  Comprehensive metabolic panel     Status: Abnormal   Collection  Time: 08/23/19  9:58 PM  Result Value Ref Range   Sodium 129 (L) 135 - 145 mmol/L   Potassium 4.9 3.5 - 5.1 mmol/L   Chloride 99 98 - 111 mmol/L   CO2 19 (L) 22 - 32 mmol/L   Glucose, Bld 120 (H) 70 - 99 mg/dL   BUN 40 (H) 8 - 23 mg/dL   Creatinine, Ser 2.58 (H) 0.44 - 1.00 mg/dL   Calcium 9.8 8.9 - 10.3 mg/dL   Total Protein 6.6 6.5 - 8.1 g/dL   Albumin 3.2 (L) 3.5 - 5.0 g/dL   AST 13 (L) 15 - 41 U/L   ALT 12 0 - 44 U/L   Alkaline Phosphatase 78 38 - 126 U/L   Total Bilirubin 0.6 0.3 - 1.2 mg/dL   GFR calc non Af Amer 16 (L) >60 mL/min   GFR calc Af Amer 18 (L) >60 mL/min   Anion gap 11 5 - 15  CBC     Status: Abnormal   Collection Time: 08/23/19  9:58 PM  Result Value Ref Range   WBC 12.1 (H) 4.0 - 10.5 K/uL   RBC 3.52 (L) 3.87 - 5.11 MIL/uL   Hemoglobin 9.4 (L) 12.0 - 15.0 g/dL   HCT 29.6 (L) 36.0 - 46.0 %   MCV 84.1 80.0 - 100.0 fL   MCH 26.7 26.0 - 34.0 pg   MCHC 31.8 30.0 - 36.0 g/dL   RDW 14.5 11.5 - 15.5 %   Platelets 413 (H) 150 - 400 K/uL   nRBC 0.0 0.0 - 0.2 %  Hemoglobin A1c     Status: Abnormal   Collection Time: 08/23/19  9:58 PM  Result Value Ref Range   Hgb A1c MFr Bld 6.6 (H) 4.8 - 5.6 %   Mean Plasma Glucose 142.72 mg/dL  Sodium, urine, random     Status: None   Collection Time: 08/23/19 11:44 PM  Result Value Ref Range   Sodium, Ur 67 mmol/L  CBC     Status: Abnormal    Collection Time: 08/24/19  5:30 AM  Result Value Ref Range   WBC 11.5 (H) 4.0 - 10.5 K/uL   RBC 3.36 (L) 3.87 - 5.11 MIL/uL   Hemoglobin 8.8 (L) 12.0 - 15.0 g/dL   HCT 28.3 (L) 36.0 - 46.0 %   MCV 84.2 80.0 - 100.0 fL   MCH 26.2 26.0 - 34.0 pg   MCHC 31.1 30.0 - 36.0 g/dL   RDW 14.6 11.5 - 15.5 %   Platelets 371 150 - 400 K/uL   nRBC 0.0 0.0 - 0.2 %  Basic metabolic panel     Status: Abnormal   Collection Time: 08/24/19  5:30 AM  Result Value Ref Range   Sodium 132 (L) 135 - 145 mmol/L   Potassium 4.8 3.5 - 5.1 mmol/L   Chloride 103 98 - 111 mmol/L   CO2 20 (L) 22 - 32 mmol/L   Glucose, Bld 127 (H) 70 - 99 mg/dL   BUN 36 (H) 8 - 23 mg/dL   Creatinine, Ser 2.31 (H) 0.44 - 1.00 mg/dL   Calcium 9.5 8.9 - 10.3 mg/dL   GFR calc non Af Amer 18 (L) >60 mL/min   GFR calc Af Amer 21 (L) >60 mL/min   Anion gap 9 5 - 15  Glucose, capillary     Status: Abnormal   Collection Time: 08/24/19  7:37 AM  Result Value Ref Range   Glucose-Capillary 100 (H) 70 - 99 mg/dL  Comment 1 Notify RN    Recent Results (from the past 240 hour(s))  SARS CORONAVIRUS 2 (TAT 6-24 HRS) Nasopharyngeal Nasopharyngeal Swab     Status: None   Collection Time: 08/23/19  1:21 PM   Specimen: Nasopharyngeal Swab  Result Value Ref Range Status   SARS Coronavirus 2 NEGATIVE NEGATIVE Final    Comment: (NOTE) SARS-CoV-2 target nucleic acids are NOT DETECTED. The SARS-CoV-2 RNA is generally detectable in upper and lower respiratory specimens during the acute phase of infection. Negative results do not preclude SARS-CoV-2 infection, do not rule out co-infections with other pathogens, and should not be used as the sole basis for treatment or other patient management decisions. Negative results must be combined with clinical observations, patient history, and epidemiological information. The expected result is Negative. Fact Sheet for Patients: SugarRoll.be Fact Sheet for Healthcare  Providers: https://www.woods-mathews.com/ This test is not yet approved or cleared by the [place] FDA and  has been authorized for detection and/or diagnosis of SARS-CoV-2 by FDA under an Emergency Use Authorization (EUA). This EUA will remain  in effect (meaning this test can be used) for the duration of the COVID-19 declaration under Section 56 4(b)(1) of the Act, 21 U.S.C. section 360bbb-3(b)(1), unless the authorization is terminated or revoked sooner. Performed at [facility], [place] [address] [place], [place] [address]     Renal Function: Recent Labs    08/21/19 1117 08/23/19 2158 08/24/19 0530  CREATININE 2.58* 2.58* 2.31*   Estimated Creatinine Clearance: 12.4 mL/min (A) (by C-G formula based on SCr of 2.31 mg/dL (H)).  Radiologic Imaging: US RENAL  Result Date: 08/24/2019 CLINICAL DATA:  Chronic kidney disease, acute renal failure EXAM: RENAL / URINARY TRACT ULTRASOUND COMPLETE COMPARISON:  CT 08/23/2019 FINDINGS: Right Kidney: Renal measurements: 11.4 x 5.7 x 6.9 cm = volume: 235 mL. Moderate right hydronephrosis as seen on CT. Normal echotexture. No visible renal mass. Left Kidney: Renal measurements: 7.9 x 4.3 x 2.3 cm = volume: 92 mL. Echogenicity within normal limits. No mass or hydronephrosis visualized. Bladder: Appears normal for degree of bladder distention. Other: None. IMPRESSION: Moderate right hydronephrosis.  No visible renal mass. Electronically Signed   By: Rolm Baptise M.D.   On: 08/24/2019 00:51   CT Renal Stone Study  Result Date: 08/23/2019 CLINICAL DATA:  Abdominal pain, weakness EXAM: CT ABDOMEN AND PELVIS WITHOUT CONTRAST TECHNIQUE: Multidetector CT imaging of the abdomen and pelvis was performed following the standard protocol without IV contrast. COMPARISON:  08/08/2019 FINDINGS: Lower chest: Small right pleural effusion, decreased since prior study. Heart is normal size. Calcifications at a apex likely related to old infarct.  Hepatobiliary: Layering gallstones within the gallbladder. No focal hepatic abnormality. Pancreas: No focal abnormality or ductal dilatation. Spleen: No focal abnormality.  Normal size. Adrenals/Urinary Tract: Severe right hydronephrosis with perinephric stranding. The right ureter is difficult to follow at the pelvic brim, but is decompressed distally. Soft tissue fullness in the region of the retroperitoneum at the pelvic brim. Cannot exclude Peri ureteral mass/neoplasm or stricture. The appearance is similar to prior study. Again, soft tissue fullness in the central right kidney, cannot exclude a midpole pararenal soft tissue mass, difficult to evaluate on this noncontrast study. No hydronephrosis on the left. Adrenal glands and urinary bladder unremarkable. Stomach/Bowel: Sigmoid diverticulosis. No active diverticulitis. No evidence of bowel obstruction. Moderate stool burden. Vascular/Lymphatic: Aortic atherosclerosis. No enlarged abdominal or pelvic lymph nodes. Reproductive: Prior hysterectomy.  No adnexal masses. Other: No free fluid or free  air. Musculoskeletal: No acute bony abnormality. IMPRESSION: Again noted is severe right hydronephrosis and perinephric stranding. The right ureter is dilated to the pelvic brim where there is increased soft tissue. Cannot exclude ureteral or peri ureteral mass/neoplasm or stricture. This has a similar appearance to recent [facility] study. Soft tissue fullness in the midpole of the right kidney. Cannot exclude mass. This cannot be characterized or evaluated on this noncontrast study. Cholelithiasis. Small right pleural effusion, decreased since prior study. Aortic atherosclerosis. Sigmoid diverticulosis. Electronically Signed   By: Rolm Baptise M.D.   On: 08/23/2019 22:30    CT scans both from this admission and previously done in our office were reviewed.  She does have right renal enlargement, hydronephrosis, probable ureteral mass.  Significant right pleural  effusion.  Office notes and hospital notes reviewed.  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  In this 86 year old female, this is a fairly morbid condition.  She is scheduled in 3 days to have ureteroscopic management.  I can understand the desire to move this up, but in this elderly female, I think it would be well worth her while to hydrate her, get her comfortable, and perhaps even leave her in the hospital until this procedure is scheduled for Wednesday.  I do not think that this should be moved up.  I discussed this with the patient who understands.  Plan:  I will discuss with Dr. Tresa Moore.  I would suggest we keep her on schedule for Wednesday and not move up at this point.  I will send urine for culture, but antibiotic management at this point not necessary.

## 2019-08-24 NOTE — Evaluation (Signed)
Occupational Therapy Evaluation Patient Details Name: [name] MRN: [id number] DOB: 27-Sep-1924 Today's Date: 08/24/2019    History of Present Illness [name] is a 86 y.o. female with medical history significant for CAD s/p CABG, ICM, chronic systolic CHF (EF 99991111 in 2015), CKD stage III, hypertension, hyperlipidemia, hypothyroidism, VSD s/p patch repair, type 2 diabetes, anemia of chronic disease, and history of thymoma s/p resection and radiation who presents to the ED for evaluation of generalized weakness.   Clinical Impression   PTA pt living with daughter, requiring assist for BADLs and IADLs while using RW for mobility. At time of eval, pt able to complete bed mobility and sit <> stands with min A +2 with RW. Pt +for dizziness upon standing with BP at 128/56. Dizziness subsided with time and rest. Pt completed transfer to chair with min A +2. She has very slow and cautious movements. Given current status, recommend pt return home with [facility] and 24/7 assist. If this cannot be provided at home may consider SNF. Will continue to follow per POC listed below.     Follow Up Recommendations  Home health OT;Supervision/Assistance - 24 hour;Other (comment)(as long as daughter can continue to provide 24/7)    Equipment Recommendations  None recommended by OT    Recommendations for Other Services       Precautions / Restrictions Precautions Precautions: Fall Restrictions Weight Bearing Restrictions: No      Mobility Bed Mobility Overal bed mobility: Needs Assistance Bed Mobility: Supine to Sit     Supine to sit: Min assist;+2 for physical assistance;+2 for safety/equipment     General bed mobility comments: cues to initiate transfer for BLEs and support at trunk to come to sitting. Use of bed pad to square hips to EOB  Transfers Overall transfer level: Needs assistance Equipment used: Rolling walker (2 wheeled) Transfers: Sit to/from Stand Sit to Stand: Min  assist;+2 physical assistance;+2 safety/equipment         General transfer comment: min A +2 from side of bed, pt +dizziness with standing that decreased with rest    Balance Overall balance assessment: Needs assistance Sitting-balance support: Bilateral upper extremity supported;Feet unsupported Sitting balance-Leahy Scale: Fair     Standing balance support: Bilateral upper extremity supported;During functional activity Standing balance-Leahy Scale: Poor Standing balance comment: reliant on therapist assist and UE support                           ADL either performed or assessed with clinical judgement   ADL Overall ADL's : Needs assistance/impaired Eating/Feeding: Set up;Sitting   Grooming: Set up;Sitting   Upper Body Bathing: Minimal assistance;Sitting   Lower Body Bathing: Moderate assistance;Sit to/from stand   Upper Body Dressing : Minimal assistance;Sitting   Lower Body Dressing: Moderate assistance;Sit to/from stand   Toilet Transfer: Minimal assistance;+2 for physical assistance;+2 for safety/equipment;Stand-pivot;RW Toilet Transfer Details (indicate cue type and reason): simualted with pivot to recliner. Pt needing increased time and cues with slow and guarded mobility and dizziness Toileting- Clothing Manipulation and Hygiene: Minimal assistance;Sit to/from stand   Tub/ Shower Transfer: Minimal assistance;Ambulation;Shower seat;Rolling walker   Functional mobility during ADLs: Minimal assistance;+2 for physical assistance;+2 for safety/equipment;Rolling walker General ADL Comments: limited by generalized weakness with poor activity tolerance     Vision Baseline Vision/History: Wears glasses Wears Glasses: At all times Patient Visual Report: No change from baseline       Perception     Praxis  Pertinent Vitals/Pain Pain Assessment: No/denies pain     Hand Dominance     Extremity/Trunk Assessment Upper Extremity Assessment Upper  Extremity Assessment: Generalized weakness   Lower Extremity Assessment Lower Extremity Assessment: Defer to PT evaluation       Communication Communication Communication: HOH   Cognition Arousal/Alertness: Awake/alert Behavior During Therapy: WFL for tasks assessed/performed Overall Cognitive Status: Impaired/Different from baseline Area of Impairment: Memory                     Memory: Decreased short-term memory         General Comments: cues for STM, difficulty recalling some home set up information. Pt also very HOH making full assessment difficult   General Comments       Exercises     Shoulder Instructions      Home Living Family/patient expects to be discharged to:: Private residence Living Arrangements: Children(daughter) Available Help at Discharge: Family;Available PRN/intermittently   Home Access: Ramped entrance     Home Layout: One level     Bathroom Shower/Tub: Occupational psychologist: Standard     Home Equipment: Environmental consultant - 2 wheels;Walker - 4 wheels;Shower seat;Wheelchair - manual;Bedside commode          Prior Functioning/Environment Level of Independence: Needs assistance  Gait / Transfers Assistance Needed: uses RW to ambulate ADL's / Homemaking Assistance Needed: assist from daughter for BADL And IADL Communication / Swallowing Assistance Needed: HOH          OT Problem List: Decreased strength;Decreased knowledge of use of DME or AE;Decreased activity tolerance;Decreased cognition;Impaired balance (sitting and/or standing);Decreased safety awareness      OT Treatment/Interventions: Self-care/ADL training;Therapeutic exercise;Patient/family education;Balance training;Energy conservation;Therapeutic activities;DME and/or AE instruction;Cognitive remediation/compensation    OT Goals(Current goals can be found in the care plan section) Acute Rehab OT Goals Patient Stated Goal: return home OT Goal Formulation: With  patient Time For Goal Achievement: 09/07/19 Potential to Achieve Goals: Good  OT Frequency: Min 2X/week   Barriers to D/C:            Co-evaluation PT/OT/SLP Co-Evaluation/Treatment: Yes Reason for Co-Treatment: For patient/therapist safety;To address functional/ADL transfers   OT goals addressed during session: ADL's and self-care;Strengthening/ROM      AM-PAC OT "6 Clicks" Daily Activity     Outcome Measure Help from another person eating meals?: None Help from another person taking care of personal grooming?: None Help from another person toileting, which includes using toliet, bedpan, or urinal?: A Little Help from another person bathing (including washing, rinsing, drying)?: A Lot Help from another person to put on and taking off regular upper body clothing?: A Little Help from another person to put on and taking off regular lower body clothing?: A Lot 6 Click Score: 18   End of Session Equipment Utilized During Treatment: Gait belt;Rolling walker Nurse Communication: Mobility status  Activity Tolerance: Patient tolerated treatment well Patient left: in chair;with call bell/phone within reach;with family/visitor present  OT Visit Diagnosis: Unsteadiness on feet (R26.81);Other abnormalities of gait and mobility (R26.89);Muscle weakness (generalized) (M62.81)                Time: [id number] OT Time Calculation (min): 27 min Charges:  OT General Charges $OT Visit: 1 Visit OT Evaluation $OT Eval Moderate Complexity: Baxter, MSOT, OTR/L [facility] Office Number: [phone] Pager: [phone]  Zenovia Jarred 08/24/2019, 1:25 PM

## 2019-08-25 DIAGNOSIS — D649 Anemia, unspecified: Secondary | ICD-10-CM

## 2019-08-25 DIAGNOSIS — E871 Hypo-osmolality and hyponatremia: Secondary | ICD-10-CM

## 2019-08-25 LAB — BASIC METABOLIC PANEL
Anion gap: 10 (ref 5–15)
BUN: 31 mg/dL — ABNORMAL HIGH (ref 8–23)
CO2: 20 mmol/L — ABNORMAL LOW (ref 22–32)
Calcium: 9.1 mg/dL (ref 8.9–10.3)
Chloride: 104 mmol/L (ref 98–111)
Creatinine, Ser: 2.01 mg/dL — ABNORMAL HIGH (ref 0.44–1.00)
GFR calc Af Amer: 24 mL/min — ABNORMAL LOW (ref >60)
GFR calc non Af Amer: 21 mL/min — ABNORMAL LOW (ref >60)
Glucose, Bld: 119 mg/dL — ABNORMAL HIGH (ref 70–99)
Potassium: 4.7 mmol/L (ref 3.5–5.1)
Sodium: 134 mmol/L — ABNORMAL LOW (ref 135–145)

## 2019-08-25 LAB — CBC
HCT: 26.8 % — ABNORMAL LOW (ref 36.0–46.0)
Hemoglobin: 8.5 g/dL — ABNORMAL LOW (ref 12.0–15.0)
MCH: 26.8 pg (ref 26.0–34.0)
MCHC: 31.7 g/dL (ref 30.0–36.0)
MCV: 84.5 fL (ref 80.0–100.0)
Platelets: 341 10*3/uL (ref 150–400)
RBC: 3.17 MIL/uL — ABNORMAL LOW (ref 3.87–5.11)
RDW: 14.5 % (ref 11.5–15.5)
WBC: 8.8 10*3/uL (ref 4.0–10.5)
nRBC: 0 % (ref 0.0–0.2)

## 2019-08-25 LAB — GLUCOSE, CAPILLARY
Glucose-Capillary: 110 mg/dL — ABNORMAL HIGH (ref 70–99)
Glucose-Capillary: 169 mg/dL — ABNORMAL HIGH (ref 70–99)

## 2019-08-25 LAB — UREA NITROGEN, URINE: Urea Nitrogen, Ur: 442 mg/dL

## 2019-08-25 MED ORDER — ENSURE ENLIVE PO LIQD
237.0000 mL | Freq: Two times a day (BID) | ORAL | Status: DC
  Administered 2019-08-25 – 2019-08-29 (×7): 237 mL via ORAL

## 2019-08-25 MED ORDER — SODIUM CHLORIDE 0.9 % IV SOLN: INTRAVENOUS | Status: AC

## 2019-08-25 NOTE — Progress Notes (Signed)
Initial Nutrition Assessment  DOCUMENTATION CODES:   Not applicable  INTERVENTION:  Ensure Enlive po BID, each supplement provides 350 kcal and 20 grams of protein (vanilla with a straw)  Magic cup TID with meals, each supplement provides 290 kcal and 9 grams of protein  Recommend feeding assistance with meal set up   NUTRITION DIAGNOSIS:   Inadequate oral intake related to lethargy/confusion, decreased appetite as evidenced by per patient/family report.    GOAL:   Patient will meet greater than or equal to 90% of their needs    MONITOR:   PO intake, Weight trends, Supplement acceptance, Labs, I & O's  REASON FOR ASSESSMENT:   Malnutrition Screening Tool    ASSESSMENT:  86 year old female with past medical history of CAD s/p CABG, ICM, chronic systolic CHF (EF 99991111 in 2015), CKD stage III, HTN, hypothyroidism, VSD s/p patch repair, DM2, anemia of chronic disease, history of thymoma s/p resection and radiation who presented to ED for generalized weakness and decreased po intake for the previous week.  Patient admitted on 3/21 for acute renal failure superimposed on stage 3b CKD  Per notes: -moderate right hydronephrosis, right ureteral obstruction pending cystoscopy/ureteroscopy on 3/24 -leukocytosis; possible UTI pending urine culture -improving hyponatremia  Patient awake and pleasantly confused this afternoon, daughter at bedside, untouched lunch tray on table. Patient unable to provide history. Daughter reports that patient is more engaged, talkative and able to feed herself at baseline.Daughter reports moving patient in with her and her husband 4 months ago, stated that patient usually has a good appetite, recalls intake of 2 meals/day and likes baked chicken, mashed potatoes, grapes, cuties, peanuts and raisins. Daughter stated that patient is not a big meat eater and does not particularly care for sandwiches. Prior to moving in with her daughter, patient would eat  a lot of frozen meals, chips, and cookies. Daughter endorses some desirable weight losses due to eating healthier over the past few months. Daughter states that patient has had decreased appetite and intake over the past couple of weeks, amenable to vanilla flavored supplements.   UBW 150 lbs per daughter report ~4 months ago Current wt 135 lbs  Medications reviewed and include: Coreg, SSI, Miralax, Senokot  Labs: CBGs 97-129 x 24 hrs, Na 134 (L), BUN 31 (H), Cr 2.01 (H)   NUTRITION - FOCUSED PHYSICAL EXAM: 3/22 Findings: Mild orbital fat depletion; Mild muscle depletion to dorsal hand  Diet Order:   Diet Order            Diet regular Room service appropriate? Yes; Fluid consistency: Thin  Diet effective now              EDUCATION NEEDS:   No education needs have been identified at this time  Skin:  Skin Assessment: Skin Integrity Issues: Skin Integrity Issues:: Stage I Stage I: sacrum  Last BM:  3/19  Height:   Ht Readings from Last 1 Encounters:  08/24/19 5' (1.524 m)    Weight:   Wt Readings from Last 1 Encounters:  08/25/19 61.5 kg    Ideal Body Weight:  45.5 kg  BMI:  Body mass index is 26.48 kg/m.  Estimated Nutritional Needs:   Kcal:  1200-1400  Protein:  60-70  Fluid:  >/= 1.2 L/day    Lajuan Lines, RD, LDN Clinical Nutrition After Hours/Weekend Pager # in [place]

## 2019-08-25 NOTE — Progress Notes (Signed)
PROGRESS NOTE  [name]  [id number] DOB: [birth year] DOA: 08/23/2019 PCP: Scheryl Marten, PA   Brief Narrative: [name] is a 86 y.o. female with medical history significant for CAD s/p CABG, ICM, chronic systolic CHF (EF 99991111 in 2015), CKD stage III, hypertension, hyperlipidemia, hypothyroidism, VSD s/p patch repair, type 2 diabetes, anemia of chronic disease, and history of thymoma s/p resection and radiation who presented to the ED for generalized weakness and decreased po intake for the previous week. She has been following with urology as an outpatient and has plans for cystoscopy/ureteroscopy on 08/27/2019.  Preoperative labs on 08/21/2019 showed worsening renal function with creatinine 2.58 compared to baseline of 1.4 and hyponatremia of 126.  WBC was 12.4 and hemoglobin 10.0 compared to baseline hemoglobin of 11.  Assessment & Plan: Principal Problem:   Acute renal failure superimposed on stage 3b chronic kidney disease (HCC) Active Problems:   Hypertension associated with diabetes ([place])   CAD (coronary artery disease)   Chronic systolic CHF (congestive heart failure) (HCC)   Type 2 diabetes mellitus with stage 3 chronic kidney disease (HCC)   Hyperlipidemia associated with type 2 diabetes mellitus (HCC)   Normocytic anemia   Hypothyroidism   Hydronephrosis of right kidney   Hyponatremia   AKI (acute kidney injury) ([place])  AKI on CKD stage III: Due likely to right-sided obstruction, moderate right hydronephrosis, in addition to prerenal azotemia from dehydration. CrCl remains <15ml/min despite modest improvement, not near baseline. Renal U/S suggests minimal chronic component with normal echogenicity. Urinalysis without protein or RBCs on micro. - Renally dose medications - Minimize sedating medications (d/w family and pt) - Avoid nephrotoxins including diuretic, NSAIDs, ARB, metformin.  - Continues to show improvement with IVF and inadequate per oral intake. No  evidence of overload, will reorder IV fluids today.  Right hydronephrosis, right ureteral obstruction:  - Consulted urology, Dr. Diona Fanti who will discuss with Dr. Tresa Moore. We're continuing with plan to undergo cystoscopy/ureteroscopy with Dr. Tresa Moore 08/27/2019.   Leukocytosis: Possible UTI. - Due to pyuria, some bacteriuria, started ceftriaxone 3/21 pending urine culture (sent).  Chronic HFrEF, ICM, CAD s/p CABG w/VSD patch at time of bypass: Functionally quite independent with good exercise tolerance up until the fall about a month ago. No current chest pain.  - No recent echocardiogram in the system, but last LVEF 30-35%. Holding diuretic and ARB due to volume depletion. Normotensive.  - Will require close, daily monitoring of volume status, I/O (difficult due to incontinence), daily weights.  - No longer on ASA due to falls/bleeding risk.  - Continue BB and statin. - I've paged the patient's cardiologist, Dr. Johnsie Cancel, to make him aware of admission per family's request.   Hyponatremia: Continues improvement at goal rate. - Continue isotonic saline again today and monitor. Urine Na 67.   Hypertension: - Coreg continued, will continue monitoring. Hold ARB, diuretic as above.  NIDT2DM: Very well controlled, possibly too tight for her level of frailty (HbA1c 6.6%).  - Holding metformin. Can stop SSI/CBG checks.  Normocytic anemia: Hgb below baseline but roughly stabilizing. No obvious bleeding. - Continue monitoring.  Hypothyroidism: - Continue Synthroid  HLD:  - Continue statin.   Generalized weakness: Multifactorial.  - Continue PT/OT, hydration.   Constipation:  - Started senna-ducosate and miralax. Abd remains benign but continued narcotic pain medications.  DVT prophylaxis: Heparin [place] Code Status: DNR Family Communication: Will contact again today. Disposition Plan: Patient admitted from home due to weakness with AKI and poor  per oral intake. Oral intake remains  inadequate so she will require IV fluids. This remains a very sensitive clinical situation with elderly patient with heart failure. Will continue IVF but monitoring for overload. Will likely remain in-house through until ureteroscopy.  Consultants:   Urology  Palliative care medicine team   Cardiology, Dr. Johnsie Cancel, per family request.  Procedures:   None  Antimicrobials:  None   Subjective: Has not eaten any breakfast today or much last night. Denies shortness of breath or chest pain. Abdominal/flank pain is stable, improved overall.   Objective: Vitals:   08/24/19 1757 08/24/19 2112 08/25/19 0500 08/25/19 0608  BP: (!) 131/48 (!) 121/54  132/70  Pulse: 88 92  91  Resp:  16  14  Temp:  98.7 F (37.1 C)  98.2 F (36.8 C)  TempSrc:  Oral  Oral  SpO2:  96%  97%  Weight:   61.5 kg   Height:        Intake/Output Summary (Last 24 hours) at 08/25/2019 1348 Last data filed at 08/25/2019 0610 Gross per 24 hour  Intake 1360.46 ml  Output 470 ml  Net 890.46 ml   Filed Weights   08/24/19 1224 08/25/19 0500  Weight: 58.5 kg 61.5 kg   Gen: Pleasant, elderly female in no distress Pulm: Nonlabored breathing room air. Clear laterally. CV: Regular rate and rhythm. No murmur, rub, or gallop. No JVD, no dependent edema. GI: Abdomen soft, mildly tender on right side, non-distended, with normoactive bowel sounds.  Ext: Warm, no deformities Skin: No rashes, lesions or ulcers on visualized skin. Neuro: Alert and oriented. Voice soft, delay in speech but appropriate without dysarthria. No focal neurological deficits. Psych: Judgement and insight appear fair. Mood euthymic & affect congruent. Behavior is appropriate.    Data Reviewed: I have personally reviewed following labs and imaging studies  CBC: Recent Labs  Lab 08/21/19 1117 08/23/19 2158 08/24/19 0530 08/25/19 0814  WBC 12.4* 12.1* 11.5* 8.8  HGB 10.0* 9.4* 8.8* 8.5*  HCT 31.2* 29.6* 28.3* 26.8*  MCV 83.9 84.1 84.2 84.5   PLT 463* 413* 371 [id number]   Basic Metabolic Panel: Recent Labs  Lab 08/21/19 1117 08/23/19 2158 08/24/19 0530 08/25/19 0814  NA 126* 129* 132* 134*  K 4.8 4.9 4.8 4.7  CL 97* 99 103 104  CO2 20* 19* 20* 20*  GLUCOSE 169* 120* 127* 119*  BUN 38* 40* 36* 31*  CREATININE 2.58* 2.58* 2.31* 2.01*  CALCIUM 9.9 9.8 9.5 9.1   Liver Function Tests: Recent Labs  Lab 08/23/19 2158  AST 13*  ALT 12  ALKPHOS 78  BILITOT 0.6  PROT 6.6  ALBUMIN 3.2*   Recent Labs  Lab 08/23/19 2158  LIPASE 16   HbA1C: Recent Labs    08/23/19 2158  HGBA1C 6.6*   CBG: Recent Labs  Lab 08/24/19 1140 08/24/19 1657 08/24/19 2010 08/25/19 0741 08/25/19 1201  GLUCAP 97 129* 109* 110* 169*   Urine analysis:    Component Value Date/Time   COLORURINE YELLOW 08/23/2019 2102   APPEARANCEUR HAZY (A) 08/23/2019 2102   LABSPEC 1.012 08/23/2019 2102   PHURINE 5.0 08/23/2019 2102   GLUCOSEU NEGATIVE 08/23/2019 2102   HGBUR SMALL (A) 08/23/2019 2102   BILIRUBINUR NEGATIVE 08/23/2019 2102   KETONESUR 5 (A) 08/23/2019 2102   PROTEINUR NEGATIVE 08/23/2019 2102   UROBILINOGEN 1.0 07/19/2011 1755   NITRITE NEGATIVE 08/23/2019 2102   LEUKOCYTESUR MODERATE (A) 08/23/2019 2102   Recent Results (from the past 240 hour(s))  SARS CORONAVIRUS 2 (TAT 6-24 HRS) Nasopharyngeal Nasopharyngeal Swab     Status: None   Collection Time: 08/23/19  1:21 PM   Specimen: Nasopharyngeal Swab  Result Value Ref Range Status   SARS Coronavirus 2 NEGATIVE NEGATIVE Final    Comment: (NOTE) SARS-CoV-2 target nucleic acids are NOT DETECTED. The SARS-CoV-2 RNA is generally detectable in upper and lower respiratory specimens during the acute phase of infection. Negative results do not preclude SARS-CoV-2 infection, do not rule out co-infections with other pathogens, and should not be used as the sole basis for treatment or other patient management decisions. Negative results must be combined with clinical  observations, patient history, and epidemiological information. The expected result is Negative. Fact Sheet for Patients: SugarRoll.be Fact Sheet for Healthcare Providers: https://www.woods-mathews.com/ This test is not yet approved or cleared by the [place] FDA and  has been authorized for detection and/or diagnosis of SARS-CoV-2 by FDA under an Emergency Use Authorization (EUA). This EUA will remain  in effect (meaning this test can be used) for the duration of the COVID-19 declaration under Section 56 4(b)(1) of the Act, 21 U.S.C. section 360bbb-3(b)(1), unless the authorization is terminated or revoked sooner. Performed at [facility], [place] [address] [place], [place] [address]       Radiology Studies: US RENAL  Result Date: 08/24/2019 CLINICAL DATA:  Chronic kidney disease, acute renal failure EXAM: RENAL / URINARY TRACT ULTRASOUND COMPLETE COMPARISON:  CT 08/23/2019 FINDINGS: Right Kidney: Renal measurements: 11.4 x 5.7 x 6.9 cm = volume: 235 mL. Moderate right hydronephrosis as seen on CT. Normal echotexture. No visible renal mass. Left Kidney: Renal measurements: 7.9 x 4.3 x 2.3 cm = volume: 92 mL. Echogenicity within normal limits. No mass or hydronephrosis visualized. Bladder: Appears normal for degree of bladder distention. Other: None. IMPRESSION: Moderate right hydronephrosis.  No visible renal mass. Electronically Signed   By: Rolm Baptise M.D.   On: 08/24/2019 00:51   CT Renal Stone Study  Result Date: 08/23/2019 CLINICAL DATA:  Abdominal pain, weakness EXAM: CT ABDOMEN AND PELVIS WITHOUT CONTRAST TECHNIQUE: Multidetector CT imaging of the abdomen and pelvis was performed following the standard protocol without IV contrast. COMPARISON:  08/08/2019 FINDINGS: Lower chest: Small right pleural effusion, decreased since prior study. Heart is normal size. Calcifications at a apex likely related to old infarct. Hepatobiliary:  Layering gallstones within the gallbladder. No focal hepatic abnormality. Pancreas: No focal abnormality or ductal dilatation. Spleen: No focal abnormality.  Normal size. Adrenals/Urinary Tract: Severe right hydronephrosis with perinephric stranding. The right ureter is difficult to follow at the pelvic brim, but is decompressed distally. Soft tissue fullness in the region of the retroperitoneum at the pelvic brim. Cannot exclude Peri ureteral mass/neoplasm or stricture. The appearance is similar to prior study. Again, soft tissue fullness in the central right kidney, cannot exclude a midpole pararenal soft tissue mass, difficult to evaluate on this noncontrast study. No hydronephrosis on the left. Adrenal glands and urinary bladder unremarkable. Stomach/Bowel: Sigmoid diverticulosis. No active diverticulitis. No evidence of bowel obstruction. Moderate stool burden. Vascular/Lymphatic: Aortic atherosclerosis. No enlarged abdominal or pelvic lymph nodes. Reproductive: Prior hysterectomy.  No adnexal masses. Other: No free fluid or free air. Musculoskeletal: No acute bony abnormality. IMPRESSION: Again noted is severe right hydronephrosis and perinephric stranding. The right ureter is dilated to the pelvic brim where there is increased soft tissue. Cannot exclude ureteral or peri ureteral mass/neoplasm or stricture. This has a similar appearance to recent [facility] study.  Soft tissue fullness in the midpole of the right kidney. Cannot exclude mass. This cannot be characterized or evaluated on this noncontrast study. Cholelithiasis. Small right pleural effusion, decreased since prior study. Aortic atherosclerosis. Sigmoid diverticulosis. Electronically Signed   By: Rolm Baptise M.D.   On: 08/23/2019 22:30    Scheduled Meds: . carvedilol  6.25 mg Oral BID WC  . heparin  5,000 Units Subcutaneous Q8H  . insulin aspart  0-9 Units Subcutaneous TID WC  . levothyroxine  25 mcg Oral QAC breakfast  . mouth rinse   15 mL Mouth Rinse BID  . polyethylene glycol  17 g Oral Daily  . senna-docusate  1 tablet Oral Daily  . simvastatin  40 mg Oral QPM  . sodium chloride flush  3 mL Intravenous Once   Continuous Infusions: . sodium chloride    . cefTRIAXone (ROCEPHIN)  IV Stopped (08/24/19 1610)     LOS: 1 day   Time spent: 25 minutes.  Patrecia Pour, MD [facility] [email or web address] 08/25/2019, 1:48 PM

## 2019-08-26 LAB — BASIC METABOLIC PANEL
Anion gap: 9 (ref 5–15)
BUN: 26 mg/dL — ABNORMAL HIGH (ref 8–23)
CO2: 18 mmol/L — ABNORMAL LOW (ref 22–32)
Calcium: 9.3 mg/dL (ref 8.9–10.3)
Chloride: 105 mmol/L (ref 98–111)
Creatinine, Ser: 1.88 mg/dL — ABNORMAL HIGH (ref 0.44–1.00)
GFR calc Af Amer: 26 mL/min — ABNORMAL LOW (ref >60)
GFR calc non Af Amer: 23 mL/min — ABNORMAL LOW (ref >60)
Glucose, Bld: 154 mg/dL — ABNORMAL HIGH (ref 70–99)
Potassium: 4.6 mmol/L (ref 3.5–5.1)
Sodium: 132 mmol/L — ABNORMAL LOW (ref 135–145)

## 2019-08-26 NOTE — Progress Notes (Signed)
Occupational Therapy Note:  Clinical Impression Pt was agreeable to sitting EOB and completion of grooming tasks. While sitting EOB, pt c/o abominal pain but agreed to remain sitting EOB. Pt completed grooming with CGA while sitting unsupported. Pt was able to complete lateral B UE reaching across midline to improve dynamic and static sitting balance skills with no LOB episodes . Pt required an increase with bed mobility at Moderate assist level for sit<>supine using bedrail. Pt will benefit from continued skilled acute OT services.     08/26/19 1615  OT Visit Information  Assistance Needed +1  History of Present Illness [name] is a 86 y.o. female with medical history significant for CAD s/p CABG, ICM, chronic systolic CHF (EF 99991111 in 2015), CKD stage III, hypertension, hyperlipidemia, hypothyroidism, VSD s/p patch repair, type 2 diabetes, anemia of chronic disease, and history of thymoma s/p resection and radiation who presents to the ED for evaluation of generalized weakness.  Precautions  Precautions Fall  Precaution Comments Monitor skin and joint integrity closely- frail and prone to skin tears and bruising.  Pain Assessment  Pain Assessment Faces  Pain Score 3  Faces Pain Scale 4  Pain Location abdomen  Cognition  Arousal/Alertness Awake/alert  Behavior During Therapy WFL for tasks assessed/performed  Overall Cognitive Status Impaired/Different from baseline  Area of Impairment Memory  Memory Decreased short-term memory  ADL  Grooming Set up;Sitting;Wash/dry hands;Wash/dry face;Oral care  Functional mobility during ADLs Minimal assistance;+2 for physical assistance;+2 for safety/equipment;Rolling walker  Bed Mobility  Overal bed mobility Needs Assistance  Bed Mobility Supine to Sit;Sit to Supine  Supine to sit Mod assist  Sit to supine Mod assist  General bed mobility comments cues to initiate transfer for BLEs and support at trunk to come to sitting. Use of bed pad  to square hips to EOB  Balance  Sitting balance-Leahy Scale Fair  Restrictions  Weight Bearing Restrictions No  OT - End of Session  Activity Tolerance Patient limited by pain  Patient left in bed;with call bell/phone within reach;with bed alarm set  OT Assessment/Plan  OT Plan Discharge plan remains appropriate  Follow Up Recommendations Home health OT;Supervision/Assistance - 24 hour;Other (comment)  OT Equipment None recommended by OT  AM-PAC OT "6 Clicks" Daily Activity Outcome Measure (Version 2)  Help from another person eating meals? 4  Help from another person taking care of personal grooming? 4  Help from another person toileting, which includes using toliet, bedpan, or urinal? 3  Help from another person bathing (including washing, rinsing, drying)? 2  Help from another person to put on and taking off regular upper body clothing? 3  Help from another person to put on and taking off regular lower body clothing? 2  6 Click Score 18  OT Goal Progression  Progress towards OT goals Progressing toward goals  OT Time Calculation  OT Start Time (ACUTE ONLY) 1421  OT Stop Time (ACUTE ONLY) 1438  OT Time Calculation (min) 17 min  OT General Charges  $OT Visit 1 Visit  OT Treatments  $Therapeutic Activity 8-22 mins   Welton Bord OTR/L

## 2019-08-26 NOTE — Consult Note (Signed)
Palliative Care Consult Note  Reason for consult: Goals of care in light of AKI and hydronephrosis related to mass with plan for ureteroscopic evaluation on 3/24  Palliative care consult received.  Chart reviewed including personal review of pertinent labs and imaging.  I met today with patient, her daughter, and her son via phone.  I introduced palliative care as specialized medical care for people living with serious illness. It focuses on providing relief from the symptoms and stress of a serious illness. The goal is to improve quality of life for both the patient and the family.  Ms. Pence reports that the most important thing to her is her family.  She has been living with her daughter and they spend days running small errands (to the doctor, [facility], etc).  Enjoys reading the newspaper.  She worked in an office for 40 years.  Church was a large part of her life until her husband died 6 years ago but is not currently something that is big part of her life.  We discussed clinical course as well as wishes moving forward in regard to advanced directives.  She has had decline in her nutrition and functional status since Feb 25 per her daughter secondary to increasing pain and fullness in her abdomen.    - Family would like to await urology evaluation tomorrow and requested meeting tomorrow afternoon.  We have planned follow-up meeting for tomorrow at 1330.  Questions and concerns addressed.   PMT will continue to support holistically.  Time in:1300 Time out: 1400  Total time: 60 minutes Greater than 50%  of this time was spent counseling and coordinating care related to the above assessment and plan.  Micheline Rough, MD [place] [facility] [phone]

## 2019-08-26 NOTE — Progress Notes (Signed)
PROGRESS NOTE  [name]  [id number] DOB: [birth year] DOA: 08/23/2019 PCP: Scheryl Marten, PA   Brief Narrative: [name] is a 86 y.o. female with medical history significant for CAD s/p CABG, ICM, chronic systolic CHF (EF [id number] in 2015), CKD stage III, hypertension, hyperlipidemia, hypothyroidism, VSD s/p patch repair, type 2 diabetes, anemia of chronic disease, and history of thymoma s/p resection and radiation who presented to the ED for generalized weakness and decreased po intake for the previous week. She has been following with urology as an outpatient and has plans for cystoscopy/ureteroscopy on 08/27/2019.  Preoperative labs on 08/21/2019 showed worsening renal function with creatinine 2.58 compared to baseline of 1.4 and hyponatremia of 126.  WBC was 12.4 and hemoglobin 10.0 compared to baseline hemoglobin of 11.  Assessment & Plan: Principal Problem:   Acute renal failure superimposed on stage 3b chronic kidney disease (HCC) Active Problems:   Hypertension associated with diabetes ([place])   CAD (coronary artery disease)   Chronic systolic CHF (congestive heart failure) (HCC)   Type 2 diabetes mellitus with stage 3 chronic kidney disease (HCC)   Hyperlipidemia associated with type 2 diabetes mellitus (HCC)   Normocytic anemia   Hypothyroidism   Hydronephrosis of right kidney   Hyponatremia   AKI (acute kidney injury) ([place])  AKI on CKD stage III: Due likely to right-sided obstruction, moderate right hydronephrosis, in addition to prerenal azotemia from dehydration. CrCl remains <2ml/min despite modest improvement, not near baseline. Renal U/S suggests minimal chronic component with normal echogenicity. Urinalysis without protein or RBCs on micro. - Renally dose medications - Minimize sedating medications (d/w family and pt) - Avoid nephrotoxins including diuretic, NSAIDs, ARB, metformin.  - Continues to have inadequate per oral intake. Had episode of emesis this  AM. While improving, creatinine remains well above baseline (~1.3-1.4). Continue IVF.  Right hydronephrosis, right ureteral obstruction:  - Consulted urology, Dr. Diona Fanti who will discuss with Dr. Tresa Moore. We're continuing with plan to undergo cystoscopy/ureteroscopy with Dr. Tresa Moore 08/27/2019.   UTI: GNRs without speciation at this time, though leukocytosis resolved w/CTX.  - Continue ceftriaxone and monitor speciation of urine culture.  Chronic HFrEF, ICM, CAD s/p CABG w/VSD patch at time of bypass: Functionally quite independent with good exercise tolerance up until the fall about a month ago. No current chest pain.  - No recent echocardiogram in the system, but last LVEF 30-35%. Holding diuretic and ARB due to volume depletion. Normotensive.  - Will require close, daily monitoring of volume status, I/O (difficult due to incontinence), daily weights.  - No longer on ASA due to falls/bleeding risk.  - Continue BB and statin. - Pt's nephrologist has been made aware of admission.  Hyponatremia: Improved. - Continue isotonic saline. Urine Na 67.   Hypertension: - Coreg continued, will continue monitoring. Hold ARB, diuretic as above.  NIDT2DM: Very well controlled, possibly too tight for her level of frailty (HbA1c 6.6%).  - Holding metformin. Can stop SSI/CBG checks.  Normocytic anemia: Hgb below baseline but roughly stabilizing. No obvious bleeding. - Continue monitoring.  Hypothyroidism: - Continue Synthroid  HLD:  - Continue statin.   Generalized weakness: Multifactorial.  - Continue PT/OT, hydration.   Constipation: LBM 3/19. - Started senna-ducosate and miralax. Abd remains benign but continued narcotic pain medications.  DVT prophylaxis: Heparin [place] Code Status: DNR Family Communication: Called daughter without answer today. Voicemail did not confirm identity so no VM left. Disposition Plan: Patient admitted from home due to weakness with AKI  and poor per oral  intake. Oral intake remains inadequate so she will require IV fluids. Hopeful for return home after urologic procedure with home health services.   Consultants:   Urology  Palliative care medicine team   Cardiology, Dr. Johnsie Cancel, per family request.  Procedures:   None  Antimicrobials:  None   Subjective: Denies any new complaints, denies abd pain. Had emesis x2 this morning.   Objective: Vitals:   08/25/19 1519 08/25/19 2205 08/26/19 0629 08/26/19 0700  BP: 122/84 139/66 104/67   Pulse: 85 97 90   Resp: 16 20 16    Temp: 99 F (37.2 C)  98.2 F (36.8 C)   TempSrc: Oral  Oral   SpO2: 96% 95% 94%   Weight:    58.5 kg  Height:        Intake/Output Summary (Last 24 hours) at 08/26/2019 1040 Last data filed at 08/26/2019 0300 Gross per 24 hour  Intake 1074.05 ml  Output --  Net 1074.05 ml   Filed Weights   08/24/19 1224 08/25/19 0500 08/26/19 0700  Weight: 58.5 kg 61.5 kg 58.5 kg   Gen: Pleasant, elderly female in no distress Pulm: Nonlabored breathing room air. Clear. CV: Regular rate and rhythm. No murmur, rub, or gallop. No JVD, no dependent edema. GI: Abdomen soft, no focal tenderness, non-distended, +BS. Ext: Warm, no deformities Skin: No new rashes, lesions or ulcers on visualized skin. Neuro: Alert and oriented. Speech is slow but appropriate without dysarthria. No new focal neurological deficits. Psych: Judgement and insight appear fair. Mood euthymic & affect congruent. Behavior is appropriate.    Data Reviewed: I have personally reviewed following labs and imaging studies  CBC: Recent Labs  Lab 08/21/19 1117 08/23/19 2158 08/24/19 0530 08/25/19 0814  WBC 12.4* 12.1* 11.5* 8.8  HGB 10.0* 9.4* 8.8* 8.5*  HCT 31.2* 29.6* 28.3* 26.8*  MCV 83.9 84.1 84.2 84.5  PLT 463* 413* 371 [id number]   Basic Metabolic Panel: Recent Labs  Lab 08/21/19 1117 08/23/19 2158 08/24/19 0530 08/25/19 0814 08/26/19 0530  NA 126* 129* 132* 134* 132*  K 4.8 4.9 4.8 4.7  4.6  CL 97* 99 103 104 105  CO2 20* 19* 20* 20* 18*  GLUCOSE 169* 120* 127* 119* 154*  BUN 38* 40* 36* 31* 26*  CREATININE 2.58* 2.58* 2.31* 2.01* 1.88*  CALCIUM 9.9 9.8 9.5 9.1 9.3   Liver Function Tests: Recent Labs  Lab 08/23/19 2158  AST 13*  ALT 12  ALKPHOS 78  BILITOT 0.6  PROT 6.6  ALBUMIN 3.2*   Recent Labs  Lab 08/23/19 2158  LIPASE 16   HbA1C: Recent Labs    08/23/19 2158  HGBA1C 6.6*   CBG: Recent Labs  Lab 08/24/19 1140 08/24/19 1657 08/24/19 2010 08/25/19 0741 08/25/19 1201  GLUCAP 97 129* 109* 110* 169*   Urine analysis:    Component Value Date/Time   COLORURINE YELLOW 08/23/2019 2102   APPEARANCEUR HAZY (A) 08/23/2019 2102   LABSPEC 1.012 08/23/2019 2102   PHURINE 5.0 08/23/2019 2102   GLUCOSEU NEGATIVE 08/23/2019 2102   HGBUR SMALL (A) 08/23/2019 2102   BILIRUBINUR NEGATIVE 08/23/2019 2102   KETONESUR 5 (A) 08/23/2019 2102   PROTEINUR NEGATIVE 08/23/2019 2102   UROBILINOGEN 1.0 07/19/2011 1755   NITRITE NEGATIVE 08/23/2019 2102   LEUKOCYTESUR MODERATE (A) 08/23/2019 2102   Recent Results (from the past 240 hour(s))  SARS CORONAVIRUS 2 (TAT 6-24 HRS) Nasopharyngeal Nasopharyngeal Swab     Status: None   Collection Time: 08/23/19  1:21 PM   Specimen: Nasopharyngeal Swab  Result Value Ref Range Status   SARS Coronavirus 2 NEGATIVE NEGATIVE Final    Comment: (NOTE) SARS-CoV-2 target nucleic acids are NOT DETECTED. The SARS-CoV-2 RNA is generally detectable in upper and lower respiratory specimens during the acute phase of infection. Negative results do not preclude SARS-CoV-2 infection, do not rule out co-infections with other pathogens, and should not be used as the sole basis for treatment or other patient management decisions. Negative results must be combined with clinical observations, patient history, and epidemiological information. The expected result is Negative. Fact Sheet for  Patients: SugarRoll.be Fact Sheet for Healthcare Providers: https://www.woods-mathews.com/ This test is not yet approved or cleared by the [place] FDA and  has been authorized for detection and/or diagnosis of SARS-CoV-2 by FDA under an Emergency Use Authorization (EUA). This EUA will remain  in effect (meaning this test can be used) for the duration of the COVID-19 declaration under Section 56 4(b)(1) of the Act, 21 U.S.C. section 360bbb-3(b)(1), unless the authorization is terminated or revoked sooner. Performed at [facility], [place] [address], [place], [place] [address]   Urine Culture     Status: Abnormal (Preliminary result)   Collection Time: 08/24/19 11:16 AM   Specimen: Urine, Clean Catch  Result Value Ref Range Status   Specimen Description   Final    URINE, CLEAN CATCH Performed at [facility], [place] [address] [place], [place] [address]    Special Requests   Final    NONE Performed at [facility], [place] [address], [place], [place] [address]    Culture (A)  Final    >=100,000 COLONIES/mL GRAM NEGATIVE RODS CULTURE REINCUBATED FOR BETTER GROWTH Performed at [facility], [place] [address], [place], [place] [address]    Report Status PENDING  Incomplete      Radiology Studies: No results found.  Scheduled Meds: . carvedilol  6.25 mg Oral BID WC  . feeding supplement (ENSURE ENLIVE)  237 mL Oral BID BM  . heparin  5,000 Units Subcutaneous Q8H  . levothyroxine  25 mcg Oral QAC breakfast  . mouth rinse  15 mL Mouth Rinse BID  . polyethylene glycol  17 g Oral Daily  . senna-docusate  1 tablet Oral Daily  . simvastatin  40 mg Oral QPM  . sodium chloride flush  3 mL Intravenous Once   Continuous Infusions: . cefTRIAXone (ROCEPHIN)  IV Stopped (08/25/19 1541)     LOS: 2 days   Time spent: 25 minutes.  Patrecia Pour, MD [facility] [email or web address] 08/26/2019,  10:40 AM

## 2019-08-26 NOTE — Progress Notes (Signed)
Physical Therapy Treatment Patient Details Name: [name] MRN: [id number] DOB: 08-26-1924 Today's Date: 08/26/2019    History of Present Illness [name] is a 86 y.o. female with medical history significant for CAD s/p CABG, ICM, chronic systolic CHF (EF 99991111 in 2015), CKD stage III, hypertension, hyperlipidemia, hypothyroidism, VSD s/p patch repair, type 2 diabetes, anemia of chronic disease, and history of thymoma s/p resection and radiation who presents to the ED for evaluation of generalized weakness.    PT Comments    Patient resting in bed when PT arrived. She tends to whisper. Is HOH. Has short term memory loss. Daughter had been visiting this morning and nursing had assisted her up to chair , and she sat in [facility] chair for about 1 hour. Encouraged and reassured her about the importance of mobility- and she said "it hurts too much to move". She did agree to perform some simple exercises - and she was instructed in these with A,AA- printed sheet with pictures and commentary were issued and placed at bedside on table. Should benefit from PT while hospitalized in preparation for [facility] at discharge.   Follow Up Recommendations  Home health PT     Equipment Recommendations  None recommended by PT    Recommendations for Other Services       Precautions / Restrictions Precautions Precautions: Fall Precaution Comments: Monitor skin and joint integrity closely- frail and prone to skin tears and bruising. Restrictions Weight Bearing Restrictions: No    Mobility  Bed Mobility Overal bed mobility: (She refused any attempts at bed mobility. Nursing had assisted her to [facility] chair this morning and she sat up about an hour. She said she would do some ex with PT)                Transfers                    Ambulation/Gait                 Stairs             Wheelchair Mobility    Modified Rankin (Stroke Patients Only)       Balance Overall  balance assessment: Needs assistance(She declined any bed mobility and did not want to do any OOB activity)                                          Cognition Arousal/Alertness: Awake/alert Behavior During Therapy: WFL for tasks assessed/performed Overall Cognitive Status: Impaired/Different from baseline Area of Impairment: Memory                               General Comments: She has short term memory loss- stating that her daughter just brought her here today. She did not eat any lunch and states "she does not want to eat".      Exercises General Exercises - Lower Extremity Ankle Circles/Pumps: AAROM;AROM;Both;Supine;10 reps Quad Sets: AROM;AAROM;Both;10 reps;Supine Gluteal Sets: AROM;5 reps;Supine Short Arc Quad: AROM;AAROM;Both;10 reps;Supine Heel Slides: AROM;AAROM;Supine;10 reps;Both Hip ABduction/ADduction: AROM;AAROM;Both;10 reps;Supine Other Exercises Other Exercises: Encouarged her to reposition self in bed and remember to do the ex. [name] had visited this morning, but was not present for this session. Left printed ex sheet on BS table after instruction. Other Exercises: Instructed in UE- Shoulder flexion  and abduction with visual cues- but she refused to attempt this.    General Comments        Pertinent Vitals/Pain Pain Assessment: No/denies pain    Home Living                      Prior Function            PT Goals (current goals can now be found in the care plan section) Acute Rehab PT Goals Patient Stated Goal: return home PT Goal Formulation: With patient Time For Goal Achievement: 09/07/19 Potential to Achieve Goals: Good Progress towards PT goals: PT to reassess next treatment    Frequency    Min 3X/week      PT Plan Current plan remains appropriate    Co-evaluation PT/OT/SLP Co-Evaluation/Treatment: Yes            AM-PAC PT "6 Clicks" Mobility   Outcome Measure  Help needed turning from  your back to your side while in a flat bed without using bedrails?: A Little Help needed moving from lying on your back to sitting on the side of a flat bed without using bedrails?: A Little Help needed moving to and from a bed to a chair (including a wheelchair)?: A Little Help needed standing up from a chair using your arms (e.g., wheelchair or bedside chair)?: A Little Help needed to walk in hospital room?: A Little Help needed climbing 3-5 steps with a railing? : A Lot 6 Click Score: 17    End of Session   Activity Tolerance: Patient limited by fatigue Patient left: with call bell/phone within reach;in bed Nurse Communication: Mobility status PT Visit Diagnosis: Unsteadiness on feet (R26.81);Muscle weakness (generalized) (M62.81);Difficulty in walking, not elsewhere classified (R26.2)     Time: [id number] PT Time Calculation (min) (ACUTE ONLY): 28 min  Charges:  $Therapeutic Exercise: 23-37 mins                    Rollen Sox, PT # [phone] CGV cell  Casandra Doffing 08/26/2019, 1:51 PM

## 2019-08-27 ENCOUNTER — Inpatient Hospital Stay (HOSPITAL_COMMUNITY): Payer: Medicare HMO | Admitting: Physician Assistant

## 2019-08-27 ENCOUNTER — Inpatient Hospital Stay (HOSPITAL_COMMUNITY): Payer: Medicare HMO

## 2019-08-27 ENCOUNTER — Encounter (HOSPITAL_COMMUNITY): Payer: Self-pay | Admitting: Family Medicine

## 2019-08-27 ENCOUNTER — Encounter (HOSPITAL_COMMUNITY)
Admission: EM | Disposition: A | Discharge status: Skilled Nursing Facility | Payer: Self-pay | Source: Home / Self Care | Attending: Internal Medicine

## 2019-08-27 ENCOUNTER — Ambulatory Visit (HOSPITAL_COMMUNITY): Admission: RE | Admit: 2019-08-27 | Payer: Medicare HMO | Source: Home / Self Care | Admitting: Urology

## 2019-08-27 DIAGNOSIS — C651 Malignant neoplasm of right renal pelvis: Secondary | ICD-10-CM | POA: Diagnosis present

## 2019-08-27 HISTORY — PX: CYSTOSCOPY/RETROGRADE/URETEROSCOPY: SHX5316

## 2019-08-27 LAB — SURGICAL PCR SCREEN
MRSA, PCR: NEGATIVE
Staphylococcus aureus: NEGATIVE

## 2019-08-27 LAB — GLUCOSE, CAPILLARY: Glucose-Capillary: 142 mg/dL — ABNORMAL HIGH (ref 70–99)

## 2019-08-27 LAB — BASIC METABOLIC PANEL
Anion gap: 10 (ref 5–15)
BUN: 26 mg/dL — ABNORMAL HIGH (ref 8–23)
CO2: 15 mmol/L — ABNORMAL LOW (ref 22–32)
Calcium: 9.4 mg/dL (ref 8.9–10.3)
Chloride: 109 mmol/L (ref 98–111)
Creatinine, Ser: 1.9 mg/dL — ABNORMAL HIGH (ref 0.44–1.00)
GFR calc Af Amer: 26 mL/min — ABNORMAL LOW (ref >60)
GFR calc non Af Amer: 22 mL/min — ABNORMAL LOW (ref >60)
Glucose, Bld: 152 mg/dL — ABNORMAL HIGH (ref 70–99)
Potassium: 4.6 mmol/L (ref 3.5–5.1)
Sodium: 134 mmol/L — ABNORMAL LOW (ref 135–145)

## 2019-08-27 LAB — URINE CULTURE: Culture: >=100000 — AB

## 2019-08-27 LAB — CBC
HCT: 29.1 % — ABNORMAL LOW (ref 36.0–46.0)
Hemoglobin: 9.1 g/dL — ABNORMAL LOW (ref 12.0–15.0)
MCH: 26.2 pg (ref 26.0–34.0)
MCHC: 31.3 g/dL (ref 30.0–36.0)
MCV: 83.9 fL (ref 80.0–100.0)
Platelets: 359 10*3/uL (ref 150–400)
RBC: 3.47 MIL/uL — ABNORMAL LOW (ref 3.87–5.11)
RDW: 14.8 % (ref 11.5–15.5)
WBC: 13.3 10*3/uL — ABNORMAL HIGH (ref 4.0–10.5)
nRBC: 0 % (ref 0.0–0.2)

## 2019-08-27 SURGERY — CYSTOSCOPY/RETROGRADE/URETEROSCOPY
Anesthesia: General | Laterality: Bilateral

## 2019-08-27 MED ORDER — LACTATED RINGERS IV SOLN: INTRAVENOUS | Status: DC

## 2019-08-27 MED ORDER — PROPOFOL 10 MG/ML IV BOLUS
INTRAVENOUS | Status: DC | PRN
  Administered 2019-08-27: 70 mg via INTRAVENOUS

## 2019-08-27 MED ORDER — CEFAZOLIN SODIUM-DEXTROSE 2-4 GM/100ML-% IV SOLN
2.0000 g | INTRAVENOUS | Status: AC
  Administered 2019-08-27: 2 g via INTRAVENOUS

## 2019-08-27 MED ORDER — ONDANSETRON HCL 4 MG/2ML IJ SOLN
INTRAMUSCULAR | Status: DC | PRN
  Administered 2019-08-27: 4 mg via INTRAVENOUS

## 2019-08-27 MED ORDER — SODIUM CHLORIDE 0.9 % IR SOLN
Status: DC | PRN
  Administered 2019-08-27: 3000 mL

## 2019-08-27 MED ORDER — IOHEXOL 300 MG/ML  SOLN
INTRAMUSCULAR | Status: DC | PRN
  Administered 2019-08-27: 29 mL

## 2019-08-27 MED ORDER — DEXAMETHASONE SODIUM PHOSPHATE 4 MG/ML IJ SOLN
INTRAMUSCULAR | Status: DC | PRN
  Administered 2019-08-27: 4 mg via INTRAVENOUS

## 2019-08-27 MED ORDER — 0.9 % SODIUM CHLORIDE (POUR BTL) OPTIME
TOPICAL | Status: DC | PRN
  Administered 2019-08-27: 1000 mL

## 2019-08-27 MED ORDER — LACTATED RINGERS IV SOLN: INTRAVENOUS | Status: DC | PRN

## 2019-08-27 MED ORDER — LIDOCAINE 2% (20 MG/ML) 5 ML SYRINGE
INTRAMUSCULAR | Status: DC | PRN
  Administered 2019-08-27 (×2): 40 mg via INTRAVENOUS

## 2019-08-27 MED ORDER — ETOMIDATE 2 MG/ML IV SOLN
INTRAVENOUS | Status: DC | PRN
  Administered 2019-08-27: 10 mg via INTRAVENOUS

## 2019-08-27 MED ORDER — KETAMINE HCL 10 MG/ML IJ SOLN
INTRAMUSCULAR | Status: DC | PRN
  Administered 2019-08-27: 50 mg via INTRAVENOUS

## 2019-08-27 SURGICAL SUPPLY — 28 items
BAG URO CATCHER STRL LF (MISCELLANEOUS) ×2 IMPLANT
BASKET LASER NITINOL 1.9FR (BASKET) ×1 IMPLANT
BSKT STON RTRVL 120 1.9FR (BASKET) ×1
CATH INTERMIT  6FR 70CM (CATHETERS) ×2 IMPLANT
CATH URET DUAL LUMEN 6-10FR 50 (CATHETERS) ×1 IMPLANT
CLOTH BEACON ORANGE TIMEOUT ST (SAFETY) ×1 IMPLANT
EXTRACTOR STONE 1.7FRX115CM (UROLOGICAL SUPPLIES) IMPLANT
FIBER LASER FLEXIVA 1000 (UROLOGICAL SUPPLIES) IMPLANT
FIBER LASER FLEXIVA 365 (UROLOGICAL SUPPLIES) IMPLANT
FIBER LASER FLEXIVA 550 (UROLOGICAL SUPPLIES) IMPLANT
FIBER LASER TRAC TIP (UROLOGICAL SUPPLIES) IMPLANT
FORCEPS BIOP 2.4F 115CM BACKLD (INSTRUMENTS) ×1 IMPLANT
GLOVE BIOGEL M STRL SZ7.5 (GLOVE) ×2 IMPLANT
GOWN STRL REUS W/TWL LRG LVL3 (GOWN DISPOSABLE) ×2 IMPLANT
GUIDEWIRE ANG ZIPWIRE 038X150 (WIRE) ×2 IMPLANT
GUIDEWIRE STR DUAL SENSOR (WIRE) ×2 IMPLANT
KIT TURNOVER KIT A (KITS) IMPLANT
MANIFOLD NEPTUNE II (INSTRUMENTS) ×2 IMPLANT
PACK CYSTO (CUSTOM PROCEDURE TRAY) ×2 IMPLANT
PAD TELFA 2X3 NADH STRL (GAUZE/BANDAGES/DRESSINGS) ×1 IMPLANT
SHEATH URETERAL 12FRX28CM (UROLOGICAL SUPPLIES) ×1 IMPLANT
SHEATH URETERAL 12FRX35CM (MISCELLANEOUS) IMPLANT
STENT POLARIS LOOP 8FR X 24 CM (STENTS) ×1 IMPLANT
SYR TOOMEY IRRIG 70ML (MISCELLANEOUS) ×2
SYRINGE TOOMEY IRRIG 70ML (MISCELLANEOUS) IMPLANT
TUBE FEEDING 8FR 16IN STR KANG (MISCELLANEOUS) ×2 IMPLANT
TUBING CONNECTING 10 (TUBING) ×2 IMPLANT
TUBING UROLOGY SET (TUBING) ×2 IMPLANT

## 2019-08-27 NOTE — Brief Op Note (Signed)
08/23/2019 - 08/27/2019  11:31 AM  PATIENT:  [name]  86 y.o. female  PRE-OPERATIVE DIAGNOSIS:  RIGHT HYDRONEPHROSIS, POSSIBLE URETERAL MASS  POST-OPERATIVE DIAGNOSIS:  RIGHT HYDRONEPHROSIS, POSSIBLE URETERAL MASS  PROCEDURE:  Procedure(s) with comments: CYSTOSCOPY, BILATERAL RETROGRADE, RIGHT URETEROSCOPY, RIGHT RENAL PELVIS BIOPSY, RIGHT URETERAL STENT PLACEMENT (Bilateral) - 75 MINS  SURGEON:  Surgeon(s) and Role:    * [name], MD - Primary  PHYSICIAN ASSISTANT:   ASSISTANTS: none   ANESTHESIA:   general  EBL:  13mL   BLOOD ADMINISTERED:none  DRAINS: none   LOCAL MEDICATIONS USED:  NONE  SPECIMEN:  Source of Specimen:  Rt renal pelvis cancer  DISPOSITION OF SPECIMEN:  PATHOLOGY  COUNTS:  YES  TOURNIQUET:  * No tourniquets in log *  DICTATION: .Other Dictation: Dictation Number  [id number]  PLAN OF CARE: Admit to inpatient   PATIENT DISPOSITION:  PACU - hemodynamically stable.   Delay start of Pharmacological VTE agent (>24hrs) due to surgical blood loss or risk of bleeding: yes

## 2019-08-27 NOTE — Anesthesia Procedure Notes (Signed)
Procedure Name: LMA Insertion Date/Time: 08/27/2019 10:25 AM Performed by: Lavina Hamman, CRNA Pre-anesthesia Checklist: Patient identified, Emergency Drugs available, Suction available and Patient being monitored Patient Re-evaluated:Patient Re-evaluated prior to induction Oxygen Delivery Method: Circle System Utilized Preoxygenation: Pre-oxygenation with 100% oxygen Induction Type: IV induction Ventilation: Mask ventilation without difficulty LMA: LMA inserted LMA Size: 4.0 Number of attempts: 1 Airway Equipment and Method: Bite block Placement Confirmation: positive ETCO2 Tube secured with: Tape Dental Injury: Teeth and Oropharynx as per pre-operative assessment  Comments: LMA inserted by C. Devenger, SRNA

## 2019-08-27 NOTE — Transfer of Care (Signed)
Immediate Anesthesia Transfer of Care Note  Patient: [name]  Procedure(s) Performed: Procedure(s) with comments: CYSTOSCOPY, BILATERAL RETROGRADE, RIGHT URETEROSCOPY, RIGHT RENAL PELVIS BIOPSY, RIGHT URETERAL STENT PLACEMENT (Bilateral) - 18 MINS  Patient Location: PACU  Anesthesia Type:General  Level of Consciousness:  sedated, patient cooperative and responds to stimulation  Airway & Oxygen Therapy:Patient Spontanous Breathing and Patient connected to face mask oxgen  Post-op Assessment:  Report given to PACU RN and Post -op Vital signs reviewed and stable  Post vital signs:  Reviewed and stable  Last Vitals:  Vitals:   08/27/19 0613 08/27/19 1009  BP: (!) 158/71 (!) 151/94  Pulse: 98 100  Resp: (!) 22 (!) 24  Temp: (!) 36.3 C   SpO2: (!) [id number]    Complications: No apparent anesthesia complications

## 2019-08-27 NOTE — Progress Notes (Signed)
Subjective/Chief Complaint:  1 -Rule Out Right Renal Neoplasm - Rt hydro and multifocal soft tissue densities in Rt ureter and renal pelvis area by non-contrast CT 08/2019 on evaluation of back pain (chornic). Some ? if hilar adenopathy. Appears 1 artery / 1 vein right renovascular anatomy, but no contrast studies to verify yet.   2 - Recurrent Cystitis - seeral episodes per year of simple cystitis. She is diabetic with PVR's 254mL range x many. Most recent CX e. coli senes keflex, cipro, nitro, RES gent. Also some klebsiella / penidng this admission. On Rocephin this admission.   3 -Stage 3 Renal Insuficiency - Cr 1.4's at bselien / GFR 30s. Lonh h/o DM2, HTN.   PMH sig for CAD/CABG/VSD Repair (Follows [name]), VAg Hyst (benign), DM2 (A1c 6's), Frailty/Falls. She lives at [place] with her daughter [name], her [name] who lives in [place] is involved too. Her PCP is with [facility].   Today " [name] " is seen to proceed with RIGHT ureteroscopy / possible biopsy to rule out Right upper tract neoplasm. She is currently admitted for dehydration and failure to thrive. GFR now improving with hydration. She has been on rocephin for few days and now w/o fevers.    Objective: Vital signs in last 24 hours: Temp:  [97.4 F (36.3 C)-98.2 F (36.8 C)] 98.2 F (36.8 C) (03/23 2039) Pulse Rate:  [87-90] 87 (03/23 2039) Resp:  [16-17] 17 (03/23 2039) BP: (104-160)/(62-84) 105/84 (03/23 2039) SpO2:  [92 %-95 %] 92 % (03/23 2039) Weight:  [58.5 kg] 58.5 kg (03/23 0700) Last BM Date: 08/22/19  Intake/Output from previous day: 03/23 0701 - 03/24 0700 In: -  Out: 200 [Urine:200] Intake/Output this shift: No intake/output data recorded.  General appearance: alert and stigmata of functional deceline, very frail but pleasant.  Eyes: negative Nose: Nares normal. Septum midline. Mucosa normal. No drainage or sinus tenderness. Throat: lips, mucosa, and tongue normal; teeth and gums  normal Neck: supple, symmetrical, trachea midline Back: symmetric, no curvature. ROM normal. No CVA tenderness. Resp: non-labored on RA Cardio: No rate GI: soft, non-tender; bowel sounds normal; no masses,  no organomegaly Extremities: extremities normal, atraumatic, no cyanosis or edema Neurologic: Grossly normal  Lab Results:  Recent Labs    08/25/19 0814  WBC 8.8  HGB 8.5*  HCT 26.8*  PLT 341   BMET Recent Labs    08/25/19 0814 08/26/19 0530  NA 134* 132*  K 4.7 4.6  CL 104 105  CO2 20* 18*  GLUCOSE 119* 154*  BUN 31* 26*  CREATININE 2.01* 1.88*  CALCIUM 9.1 9.3   PT/INR No results for input(s): LABPROT, INR in the last 72 hours. ABG No results for input(s): PHART, HCO3 in the last 72 hours.  Invalid input(s): PCO2, PO2  Studies/Results: No results found.  Anti-infectives: Anti-infectives (From admission, onward)   Start     Dose/Rate Route Frequency Ordered Stop   08/24/19 1530  cefTRIAXone (ROCEPHIN) 1 g in sodium chloride 0.9 % 100 mL IVPB     1 g 200 mL/hr over 30 Minutes Intravenous Every 24 hours 08/24/19 1528        Assessment/Plan:  Prroceed as planned with cysto, bilateral retrogrades, right ureteroscopy / possible biopsy / possible stent for diagnostic intent and hopefully relived rt obstruction with stent. Risks (substantial including mortality given poor functional status), benefits, alternatives (including purely palliatve approach), expected peri-op course discussed with pt and daughter previously and reiterated today. I again feel they  have unrealistic expectations. Greatly appreciate medical team and palliative team input.   I talked to [facility] directly today and we agreed on NOT suspending her DNR peri-op. She was AOx3 for this interaction.   [name] 08/27/2019

## 2019-08-27 NOTE — Op Note (Signed)
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  PROCEDURE: 1.  Cystoscopy, bilateral retrograde pyelograms interpretation. 2.  Right ureteroscopy with biopsy. 3.  Insertion of right ureteral stent 8 x 24 Polaris, no tether.  ESTIMATED BLOOD LOSS:  50 mL.  COMPLICATIONS:  None.  SPECIMENS:  Right renal pelvis cancer for pathology.  FINDINGS: 1.  Mid ureteral narrowing likely due to extrinsic ureteral tumor. 2.  Large volume right renal pelvis cancer. 3.  Successful placement of right ureteral stent proximal end-stage renal pelvis, distal end in urinary bladder. 4.  Unremarkable left retrograde pyelogram.  INDICATIONS:  The patient is a pleasant but unfortunate 86 year old woman with a recent history of dramatic functional decline.  She was found on imaging to have some right hydronephrosis with patchy changes of the right kidney concerning for a  post-infectious process versus a neoplasm.  I reviewed her films and became highly concerned about infiltrating neoplasm given overall clinical picture.  Options were discussed for management including purely palliative protocols versus right  ureteroscopy with goal of confirming the diagnosis and stent placement to hopefully reduce malignant obstruction.  The patient and family wished to proceed.  Informed consent was then placed and obtained in the medical record.  We specifically reviewed  that she is much higher risk than most patient's given her age, comorbidity, and functional decline.  DESCRIPTION OF PROCEDURE:  The patient being identified as the patient, procedure being cystoscopy, retrogrades, right ureteroscopy with likely biopsy was confirmed.  Procedure timeout was  performed.  Intravenous administered.  General LMA anesthesia  induced.  The patient was placed into a low lithotomy position, sterile field was created prepping the patient's vagina, introitus and proximal thighs using iodine.  Cystourethroscopy was performed using 21-French rigid cystoscope vessel lens.   Inspection of bladder revealed no diverticula, calcifications comparable lesions.  The left ureteral orifice was cannulated with a 6-French end-hole catheter and left retrograde pyelogram was obtained.  Left retrograde demonstrated a single left ureter and single system left kidney.  No filling defects or narrowing noted.  Similarly, right retrograde pyelogram was obtained.  Right retrograde pyelogram does reveal a single right ureter single system right kidney.  There was narrowing in the midureter and patchiness concerning for malignancy.  There was significant patchy infiltration of the intraluminal right kidney, highly  concerning for right renal pelvis neoplasm.  There was some tortuosity as well.  A 0.03 ZIPwire was advanced to lower pole as a safety wire and eventually into placed in the urinary bladder first released and semirigid ureteroscopy was performed of the  distal 4/5 distal right ureter alongside a separate sensor working wire.  Fortunately, the mid ureteral area of narrowing showed no obvious intraluminal tumor.  However, there was likely extrinsic compression felt to likely represent luminal extension of  tumor.  The semirigid scope was exchanged for a 12/14 short length ureteral access sheath using continuous fluoroscopic guidance to the mid ureter and flexible digital ureteroscopy performed the proximal right ureter and kidney.  The renal pelvis and  multiple calices were nearly completely infiltrated with papillary tumor, some components were sessile.  This was highly vascular.  Photodocumentation performed.  This confirmed the right of renal pelvis cancer.  To help further  stratify risk and grade,  felt that a biopsy  would be warranted.  As such, an escape type basket was used in attempt to snare representative areas of papillary tumor.  However, this was not very successful at obtaining significant fragments.  As such, the BIGopsy apparatus was  backloaded onto the digital ureteroscope and guided to the level of the tumor and representative areas were biopsied x3.  This resulted in much more substantial tissue fragments were set aside for pathology, labeled as such.  Given the significant mid  ureteral narrowing and significant volume right renal pelvis tumor, it was felt that interval stenting would be warranted mostly for palliative intent to decompress the right kidney.  The access sheath removed under continuous vision.  No additional  mucosal abnormalities were found, and a new 8 x 24 Polaris-type stent was placed remaining safety wire using fluoroscopic guidance.  Good proximal and distal plane were noted.  Anesthesia terminated.  The patient tolerated the procedure well.  No  immediate complications.  The patient was taken to postanesthesia care in stable condition.  Plan for continued hospital admission and frank discussion with her family and the other teams regarding her likely diagnosis and very poor prognosis.  CN/NUANCE  D:08/27/2019 T:08/27/2019 JOB:[id number]

## 2019-08-27 NOTE — Progress Notes (Signed)
Daily Progress Note   Patient Name: [name]       Date: 08/27/2019 DOB: 1924-11-05  Age: 86 y.o. MRN#: [id number] Attending Physician: Aline August, MD Primary Care Physician: Scheryl Marten, [place] Admit Date: 08/23/2019  Reason for Consultation/Follow-up: Establishing goals of care  Subjective: I met today with [name], her daughter, and her son via phone.  [name] is still groggy from procedure and did not participate in conversation.  I talked with her son and daughter about her clinical course of the last 24 hours, and we reviewed findings from cystoscopy and ureteroscopy with Dr. Tresa Moore.  Her son reports that they spoke with Dr. Tammi Klippel following the procedure and he discussed with him the fact that she appears to have advanced right renal pelvis cancer.  Her son expressed understanding that she is not currently a candidate for any sort of surgical intervention, however, he notes that Dr. Tresa Moore told him that there may be some sort of disease modifying therapy in pill form that she may be a candidate to take.  Both [name] and [name] are invested in plan to pursue any disease modifying therapy that has potential to add either time or quality to her life.  We briefly touched on options for best supportive care if oral medication is not likely to add significant benefit, but at this point, focus of family is getting more information about potential disease modifying therapy.  Length of Stay: 3  Current Medications: Scheduled Meds:   carvedilol  6.25 mg Oral BID WC   feeding supplement (ENSURE ENLIVE)  237 mL Oral BID BM   heparin  5,000 Units Subcutaneous Q8H   levothyroxine  25 mcg Oral QAC breakfast   mouth rinse  15 mL Mouth Rinse BID   polyethylene glycol  17 g Oral Daily    senna-docusate  1 tablet Oral Daily   simvastatin  40 mg Oral QPM   sodium chloride flush  3 mL Intravenous Once    Continuous Infusions:  cefTRIAXone (ROCEPHIN)  IV 1 g (08/26/19 1521)    PRN Meds: acetaminophen **OR** acetaminophen, lip balm, ondansetron **OR** ondansetron (ZOFRAN) IV, traMADol  Physical Exam         Sleepy following procedure.  Awakens but does not really participate in conversation.  Vital Signs: BP 111/81 (BP  Location: Right Arm)    Pulse 92    Temp 98.4 F (36.9 C) (Oral)    Resp (!) 24    Ht 5' (1.524 m)    Wt 58.5 kg    SpO2 97%    BMI 25.19 kg/m  SpO2: SpO2: 97 % O2 Device: O2 Device: Room Air O2 Flow Rate: O2 Flow Rate (L/min): 3 L/min  Intake/output summary:   Intake/Output Summary (Last 24 hours) at 08/27/2019 1416 Last data filed at 08/27/2019 1257 Gross per 24 hour  Intake 100 ml  Output 750 ml  Net -650 ml   LBM: Last BM Date: 08/22/19 Baseline Weight: Weight: 58.5 kg Most recent weight: Weight: 58.5 kg       Palliative Assessment/Data:    Flowsheet Rows     Most Recent Value  Intake Tab  Referral Department  Hospitalist  Unit at Time of Referral  Oncology Unit  Palliative Care Primary Diagnosis  Cancer  Date Notified  08/25/19  Palliative Care Type  New Palliative care  Reason for referral  Clarify Goals of Care  Date of Admission  08/23/19  Date first seen by Palliative Care  08/26/19  # of days Palliative referral response time  1 Day(s)  # of days IP prior to Palliative referral  2  Clinical Assessment  Palliative Performance Scale Score  60%  Psychosocial & Spiritual Assessment  Palliative Care Outcomes  Patient/Family meeting held?  Yes  Who was at the meeting?  Patient, daughter, son      Patient Active Problem List   Diagnosis Date Noted   Cancer of renal pelvis, right ([place]) 08/27/2019   AKI (acute kidney injury) ([place]) 08/24/2019   Acute renal failure superimposed on stage 3b chronic kidney disease ([place])  08/23/2019   Type 2 diabetes mellitus with stage 3 chronic kidney disease ([place]) 08/23/2019   Hyperlipidemia associated with type 2 diabetes mellitus ([place]) 08/23/2019   Normocytic anemia 08/23/2019   Hypothyroidism 08/23/2019   Hydronephrosis of right kidney 08/23/2019   Hyponatremia 08/23/2019   Hip pain 04/22/2019   Pain in left knee 06/07/2017   Acute non-infective otitis externa of right ear 10/31/2016   Impacted cerumen of both ears 12/27/2015   Marginal perforation of tympanic membrane of right ear 12/27/2015   Sensorineural hearing loss (SNHL), bilateral 12/27/2015   S/P lumbar laminectomy 08/19/2015   Ischemic cardiomyopathy    Chronic systolic CHF (congestive heart failure) ([place]) 08/18/2011   Pleural effusion 08/18/2011   Cellulitis 08/18/2011   Dyspnea 08/17/2011   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  Her son reports calling and leaving a message requesting more information from Dr. Zettie Pho office already.  We will continue discussion regarding long-term goals of care based upon her clinical course.  At this point, family is invested in plan for any offered disease modifying therapy with the hopes it would add time or quality to [name]'s life.  Recommend palliative care to follow with patient at discharge.  As she is a [facility] patient she would be candidate for care connections program through hospice of the  [place].  Code Status:    Code Status Orders  (From admission, onward)         Start     Ordered   08/23/19 2338  Do not attempt resuscitation (DNR)  Continuous    Question Answer Comment  In the event of cardiac or respiratory ARREST Do not call a code blue   In the event of cardiac or respiratory ARREST Do not perform Intubation, CPR, defibrillation or ACLS   In the event of cardiac or respiratory ARREST Use medication by any route, position, wound care, and other measures to relive pain and suffering. May use oxygen, suction and manual treatment of airway obstruction as needed for comfort.      08/23/19 2338        Code Status History    Date Active Date Inactive Code Status Order ID Comments User Context   04/22/2019 2045 04/24/2019 1721 DNR [id number]  Truddie Hidden, MD Inpatient   07/17/2011 1340 07/18/2011 2016 Full Code [id number]  Grace Isaac, MD Inpatient   Advance Care Planning Activity    Advance Directive Documentation     Most Recent Value  Type of Advance Directive  Healthcare Power of Attorney, Living will  Pre-existing out of facility DNR order (yellow form or pink MOST form)  --  "MOST" Form in Place?  --       Prognosis: Guarded  Discharge Planning:  Home with Home Health and recommendation for palliative care follow-up through care connections program  Care plan was discussed with patient son and daughter  Thank you for allowing the Palliative Medicine Team to assist in the care of this patient.   Time In: 1330 Time Out: 1415 Total Time 45 Prolonged Time Billed No      Greater than 50%  of this time was spent counseling and coordinating care related to the above assessment and plan.  Micheline Rough, MD  Please contact Palliative Medicine Team phone at [phone] for questions and concerns.

## 2019-08-27 NOTE — Anesthesia Preprocedure Evaluation (Addendum)
Anesthesia Evaluation  Patient identified by MRN, date of birth, ID band Patient awake    Reviewed: Allergy & Precautions, H&P , NPO status , Patient's Chart, lab work & pertinent test results, reviewed documented beta blocker date and time   Airway Mallampati: II  TM Distance: >3 FB Neck ROM: Full    Dental no notable dental hx. (+) Teeth Intact, Dental Advisory Given   Pulmonary    Pulmonary exam normal breath sounds clear to auscultation       Cardiovascular hypertension, Pt. on medications and Pt. on home beta blockers + CAD, + Past MI, + CABG and +CHF   Rhythm:Regular Rate:Normal     Neuro/Psych negative neurological ROS  negative psych ROS   GI/Hepatic negative GI ROS, Neg liver ROS,   Endo/Other  diabetes, Type 2, Oral Hypoglycemic AgentsHypothyroidism   Renal/GU Renal diseaseCKD III  negative genitourinary   Musculoskeletal  (+) Arthritis , Osteoarthritis,    Abdominal Normal abdominal exam  (+)   Peds  Hematology  (+) Blood dyscrasia, anemia ,   Anesthesia Other Findings   -------------------------------------------------------------------  Indications:   SOB (R0.600). ICM (I25.5).   -------------------------------------------------------------------  History:  PMH: Small muscular VSD residual. Acquired from the  patient and from the patient&'s chart. Dyspnea. Coronary artery  disease. Ischemic cardiomyopathy, with congestive heart failure,  with an ejection fraction of 35%by echocardiography. The  dysfunction is primarily systolic.   -------------------------------------------------------------------  Study Conclusions   - Left ventricle: The cavity size was normal. There was mild focal  basal hypertrophy of the septum. Systolic function was moderately  to severely reduced. The estimated ejection fraction was in the  range of 30% to 35%. There is akinesis of the   mid-apicalanteroseptal and apical myocardium. Doppler parameters  are consistent with abnormal left ventricular relaxation (grade 1  diastolic dysfunction).  - Aortic valve: There was mild regurgitation.  - Mitral valve: There was mild regurgitation.   Impressions:   - Extremely limited due to poor sound wave transmission; LV  function difficult to quantitate as endocardium not well  visualized; there appears to be anteroseptal and apical akinesis  with significantly reduced LV function (?EF 30-35); grade 1  diastolic dysfunction; mild AI and MR. Compared to 09/19/11,  distal muscular VSD is not well interrogated and not visualized;  suggest TEE or cardiac MRI to further assess if clinically  indicated.   -------------------------------------------------------------------  Labs, prior tests, procedures, and surgery:  Echocardiography (April 2013).   EF was 35%.     Reproductive/Obstetrics negative OB ROS                            Anesthesia Physical  Anesthesia Plan  ASA: IV  Anesthesia Plan: General   Post-op Pain Management:    Induction: Intravenous  PONV Risk Score and Plan: 3  Airway Management Planned: LMA  Additional Equipment: None  Intra-op Plan:   Post-operative Plan: Extubation in OR  Informed Consent: I have reviewed the patients History and Physical, chart, labs and discussed the procedure including the risks, benefits and alternatives for the proposed anesthesia with the patient or authorized representative who has indicated his/her understanding and acceptance.     Dental advisory given  Plan Discussed with: CRNA  Anesthesia Plan Comments:         Anesthesia Quick Evaluation

## 2019-08-27 NOTE — Progress Notes (Signed)
Verbal/over-the-phone Consent obtained from [name] (daughter) for Cystoscopy today. Consent signed by two RN

## 2019-08-27 NOTE — Progress Notes (Signed)
Patient ID: [name], female   DOB: [birth year], 86 y.o.   MRN: [id number]  PROGRESS NOTE    [name]  [id number] DOB: 04/05/1925 DOA: 08/23/2019 PCP: Scheryl Marten, PA   Brief Narrative:  86 year old female with history of CAD status post CABG, ICM, chronic systolic CHF with EF of 30 to 35% in 2015, chronic kidney disease stage III, hypertension, hyperlipidemia, hypothyroidism, VSD status post patch repair, diabetes mellitus type 2, anemia of chronic disease, history of thymoma status post resection and radiation presented with generalized weakness and decreased p.o. intake.  She has been following with urology as an outpatient and had plans for cystoscopy/ureteroscopy on 08/27/2019.  Preop labs on 08/21/2019 showed worsening renal function with creatinine of 2.58 compared to baseline of 1.4 along with hyponatremia of 126.  Patient was admitted for the same.  Assessment & Plan:   AKI on chronic kidney disease stage III -Most likely due to right-sided obstruction, moderate right hydronephrosis in addition to prerenal azotemia from dehydration -Renal ultrasound suggested minimal chronic component with normal echogenicity -Creatinine 1.9 today.  Baseline creatinine around 1.3-1.4 -Monitor.  Continue IV fluids. -Avoid nephrotoxins including diuretic/NSAID/ARB/Metformin  Right hydronephrosis, right ureteral obstruction -Urology following.  Plan for cystoscopy/ureteroscopy today  UTI -Urine culture growing Klebsiella pneumonia and E. coli.  Currently on IV Rocephin.  Switch to oral once starts on a diet.  Chronic systolic heart failure CAD status post CABG with VSD patch at the time of bypass -Last EF of 30 to 35% in 2015.  Diuretic and ARB on hold due to volume depletion and acute kidney injury. -Strict input output.  Daily weights. -No longer on aspirin due to fall/bleeding risk -Continue beta-blocker and statin  Hyponatremia -Improving.  Sodium 134  today.  Leukocytosis -Monitor  Hypertension -Continue Coreg.  Monitor blood pressure.  ARB/diuretic on hold  Diabetes mellitus type 2 -A1c 6.6.  Metformin on hold.  Anemia of chronic disease -Probably from renal failure.  Hemoglobin stable.  Monitor  Hypothyroidism -Continue Synthroid  Hyperlipidemia -Continue statin  Generalized weakness Generalized deconditioning -Continue PT/OT -Palliative care plan to have family meeting today.   DVT prophylaxis: Heparin Code Status: DNR Family Communication: None at bedside Disposition Plan: Hopefully discharge home in 1 to 2 days if cleared by urology.  Will also need palliative care follow-up with family regarding goals and disposition plan.  Consultants: Urology/palliative care  Procedures: None  Antimicrobials:  Rocephin from 08/24/2019 onwards  Subjective: Patient seen and examined at bedside.  No overnight fever, vomiting, worsening abdominal pain reported.  Poor historian.  Objective: Vitals:   08/26/19 2039 08/27/19 0613 08/27/19 0958 08/27/19 1009  BP: 105/84 (!) 158/71  (!) 151/94  Pulse: 87 98  100  Resp: 17 (!) 22  (!) 24  Temp: 98.2 F (36.8 C) (!) 97.4 F (36.3 C)    TempSrc: Oral Oral    SpO2: 92% (!) 87%  92%  Weight:   58.5 kg   Height:   5' (1.524 m)     Intake/Output Summary (Last 24 hours) at 08/27/2019 1106 Last data filed at 08/27/2019 1030 Gross per 24 hour  Intake 100 ml  Output 500 ml  Net -400 ml   Filed Weights   08/25/19 0500 08/26/19 0700 08/27/19 0958  Weight: 61.5 kg 58.5 kg 58.5 kg    Examination:  General exam: Appears calm and comfortable.  Elderly female lying in bed.  Poor historian. Respiratory system: Bilateral decreased breath sounds at bases with some  scattered crackles Cardiovascular system: S1 & S2 heard, Rate controlled Gastrointestinal system: Abdomen is nondistended, soft and nontender. Normal bowel sounds heard. Extremities: No cyanosis, clubbing; trace lower  extremity edema Central nervous system: Awake, very poor historian, slightly confused. No focal neurological deficits. Moving extremities Skin: No rashes, lesions or ulcers Psychiatry: Could not be assessed because of mental status    Data Reviewed: I have personally reviewed following labs and imaging studies  CBC: Recent Labs  Lab 08/21/19 1117 08/23/19 2158 08/24/19 0530 08/25/19 0814 08/27/19 0525  WBC 12.4* 12.1* 11.5* 8.8 13.3*  HGB 10.0* 9.4* 8.8* 8.5* 9.1*  HCT 31.2* 29.6* 28.3* 26.8* 29.1*  MCV 83.9 84.1 84.2 84.5 83.9  PLT 463* 413* 371 341 [id number]   Basic Metabolic Panel: Recent Labs  Lab 08/23/19 2158 08/24/19 0530 08/25/19 0814 08/26/19 0530 08/27/19 0525  NA 129* 132* 134* 132* 134*  K 4.9 4.8 4.7 4.6 4.6  CL 99 103 104 105 109  CO2 19* 20* 20* 18* 15*  GLUCOSE 120* 127* 119* 154* 152*  BUN 40* 36* 31* 26* 26*  CREATININE 2.58* 2.31* 2.01* 1.88* 1.90*  CALCIUM 9.8 9.5 9.1 9.3 9.4   GFR: Estimated Creatinine Clearance: 15.1 mL/min (A) (by C-G formula based on SCr of 1.9 mg/dL (H)). Liver Function Tests: Recent Labs  Lab 08/23/19 2158  AST 13*  ALT 12  ALKPHOS 78  BILITOT 0.6  PROT 6.6  ALBUMIN 3.2*   Recent Labs  Lab 08/23/19 2158  LIPASE 16   No results for input(s): AMMONIA in the last 168 hours. Coagulation Profile: No results for input(s): INR, PROTIME in the last 168 hours. Cardiac Enzymes: No results for input(s): CKTOTAL, CKMB, CKMBINDEX, TROPONINI in the last 168 hours. BNP (last 3 results) No results for input(s): PROBNP in the last 8760 hours. HbA1C: No results for input(s): HGBA1C in the last 72 hours. CBG: Recent Labs  Lab 08/24/19 1140 08/24/19 1657 08/24/19 2010 08/25/19 0741 08/25/19 1201  GLUCAP 97 129* 109* 110* 169*   Lipid Profile: No results for input(s): CHOL, HDL, LDLCALC, TRIG, CHOLHDL, LDLDIRECT in the last 72 hours. Thyroid Function Tests: No results for input(s): TSH, T4TOTAL, FREET4, T3FREE, THYROIDAB  in the last 72 hours. Anemia Panel: No results for input(s): VITAMINB12, FOLATE, FERRITIN, TIBC, IRON, RETICCTPCT in the last 72 hours. Sepsis Labs: No results for input(s): PROCALCITON, LATICACIDVEN in the last 168 hours.  Recent Results (from the past 240 hour(s))  SARS CORONAVIRUS 2 (TAT 6-24 HRS) Nasopharyngeal Nasopharyngeal Swab     Status: None   Collection Time: 08/23/19  1:21 PM   Specimen: Nasopharyngeal Swab  Result Value Ref Range Status   SARS Coronavirus 2 NEGATIVE NEGATIVE Final    Comment: (NOTE) SARS-CoV-2 target nucleic acids are NOT DETECTED. The SARS-CoV-2 RNA is generally detectable in upper and lower respiratory specimens during the acute phase of infection. Negative results do not preclude SARS-CoV-2 infection, do not rule out co-infections with other pathogens, and should not be used as the sole basis for treatment or other patient management decisions. Negative results must be combined with clinical observations, patient history, and epidemiological information. The expected result is Negative. Fact Sheet for Patients: SugarRoll.be Fact Sheet for Healthcare Providers: https://www.woods-mathews.com/ This test is not yet approved or cleared by the [place] FDA and  has been authorized for detection and/or diagnosis of SARS-CoV-2 by FDA under an Emergency Use Authorization (EUA). This EUA will remain  in effect (meaning this test can be used) for the  duration of the COVID-19 declaration under Section 56 4(b)(1) of the Act, 21 U.S.C. section 360bbb-3(b)(1), unless the authorization is terminated or revoked sooner. Performed at [facility], [place] [address], [place], [place] [address]   Urine Culture     Status: Abnormal   Collection Time: 08/24/19 11:16 AM   Specimen: Urine, Clean Catch  Result Value Ref Range Status   Specimen Description   Final    URINE, CLEAN CATCH Performed at [facility], [place] [address], [place], [place] [address]    Special Requests   Final    NONE Performed at [facility], [place] [address] [place], [place] [address]    Culture (A)  Final    >=100,000 COLONIES/mL KLEBSIELLA PNEUMONIAE >=100,000 COLONIES/mL ESCHERICHIA COLI    Report Status 08/27/2019 FINAL  Final   Organism ID, Bacteria KLEBSIELLA PNEUMONIAE (A)  Final   Organism ID, Bacteria ESCHERICHIA COLI (A)  Final      Susceptibility   Escherichia coli - MIC*    AMPICILLIN 8 SENSITIVE Sensitive     CEFAZOLIN <=4 SENSITIVE Sensitive     CEFTRIAXONE <=0.25 SENSITIVE Sensitive     CIPROFLOXACIN <=0.25 SENSITIVE Sensitive     GENTAMICIN >=16 RESISTANT Resistant     IMIPENEM <=0.25 SENSITIVE Sensitive     NITROFURANTOIN <=16 SENSITIVE Sensitive     TRIMETH/SULFA <=20 SENSITIVE Sensitive     AMPICILLIN/SULBACTAM 4 SENSITIVE Sensitive     PIP/TAZO <=4 SENSITIVE Sensitive     * >=100,000 COLONIES/mL ESCHERICHIA COLI   Klebsiella pneumoniae - MIC*    AMPICILLIN >=32 RESISTANT Resistant     CEFAZOLIN <=4 SENSITIVE Sensitive     CEFTRIAXONE <=0.25 SENSITIVE Sensitive     CIPROFLOXACIN <=0.25 SENSITIVE Sensitive     GENTAMICIN <=1 SENSITIVE Sensitive     IMIPENEM <=0.25 SENSITIVE Sensitive     NITROFURANTOIN 64 INTERMEDIATE Intermediate     TRIMETH/SULFA <=20 SENSITIVE Sensitive     AMPICILLIN/SULBACTAM 8 SENSITIVE Sensitive     PIP/TAZO <=4 SENSITIVE Sensitive     * >=100,000 COLONIES/mL KLEBSIELLA PNEUMONIAE  Surgical pcr screen     Status: None   Collection Time: 08/26/19 10:42 PM   Specimen: Nasal Mucosa; Nasal Swab  Result Value Ref Range Status   MRSA, PCR NEGATIVE NEGATIVE Final   Staphylococcus aureus NEGATIVE NEGATIVE Final    Comment: (NOTE) The Xpert SA Assay (FDA approved for NASAL specimens in patients 9 years of age and older), is one component of a comprehensive surveillance program. It is not intended to diagnose infection nor to guide or  monitor treatment. Performed at [facility], [place] [address], [place], [place] [address]          Radiology Studies: No results found.      Scheduled Meds: . [MAR Hold] carvedilol  6.25 mg Oral BID WC  . [MAR Hold] feeding supplement (ENSURE ENLIVE)  237 mL Oral BID BM  . [MAR Hold] heparin  5,000 Units Subcutaneous Q8H  . [MAR Hold] levothyroxine  25 mcg Oral QAC breakfast  . [MAR Hold] mouth rinse  15 mL Mouth Rinse BID  . [MAR Hold] polyethylene glycol  17 g Oral Daily  . [MAR Hold] senna-docusate  1 tablet Oral Daily  . [MAR Hold] simvastatin  40 mg Oral QPM  . [MAR Hold] sodium chloride flush  3 mL Intravenous Once   Continuous Infusions: . [MAR Hold] cefTRIAXone (ROCEPHIN)  IV 1 g (08/26/19 1521)  . lactated ringers  10 mL/hr at 08/27/19 1006          Maalle Starrett Starla Link, MD [facility] 08/27/2019, 11:06 AM

## 2019-08-28 ENCOUNTER — Encounter: Payer: Self-pay | Admitting: *Deleted

## 2019-08-28 LAB — CBC WITH DIFFERENTIAL/PLATELET
Abs Immature Granulocytes: 0.1 10*3/uL — ABNORMAL HIGH (ref 0.00–0.07)
Basophils Absolute: 0 10*3/uL (ref 0.0–0.1)
Basophils Relative: 0 %
Eosinophils Absolute: 0 10*3/uL (ref 0.0–0.5)
Eosinophils Relative: 0 %
HCT: 28.2 % — ABNORMAL LOW (ref 36.0–46.0)
Hemoglobin: 8.8 g/dL — ABNORMAL LOW (ref 12.0–15.0)
Immature Granulocytes: 1 %
Lymphocytes Relative: 3 %
Lymphs Abs: 0.4 10*3/uL — ABNORMAL LOW (ref 0.7–4.0)
MCH: 26.7 pg (ref 26.0–34.0)
MCHC: 31.2 g/dL (ref 30.0–36.0)
MCV: 85.5 fL (ref 80.0–100.0)
Monocytes Absolute: 0.9 10*3/uL (ref 0.1–1.0)
Monocytes Relative: 7 %
Neutro Abs: 12.2 10*3/uL — ABNORMAL HIGH (ref 1.7–7.7)
Neutrophils Relative %: 89 %
Platelets: 334 10*3/uL (ref 150–400)
RBC: 3.3 MIL/uL — ABNORMAL LOW (ref 3.87–5.11)
RDW: 14.9 % (ref 11.5–15.5)
WBC: 13.6 10*3/uL — ABNORMAL HIGH (ref 4.0–10.5)
nRBC: 0 % (ref 0.0–0.2)

## 2019-08-28 LAB — BASIC METABOLIC PANEL
Anion gap: 10 (ref 5–15)
BUN: 33 mg/dL — ABNORMAL HIGH (ref 8–23)
CO2: 17 mmol/L — ABNORMAL LOW (ref 22–32)
Calcium: 9.4 mg/dL (ref 8.9–10.3)
Chloride: 108 mmol/L (ref 98–111)
Creatinine, Ser: 1.92 mg/dL — ABNORMAL HIGH (ref 0.44–1.00)
GFR calc Af Amer: 26 mL/min — ABNORMAL LOW (ref >60)
GFR calc non Af Amer: 22 mL/min — ABNORMAL LOW (ref >60)
Glucose, Bld: 145 mg/dL — ABNORMAL HIGH (ref 70–99)
Potassium: 4.8 mmol/L (ref 3.5–5.1)
Sodium: 135 mmol/L (ref 135–145)

## 2019-08-28 LAB — SURGICAL PATHOLOGY

## 2019-08-28 LAB — GLUCOSE, CAPILLARY: Glucose-Capillary: 140 mg/dL — ABNORMAL HIGH (ref 70–99)

## 2019-08-28 LAB — MAGNESIUM: Magnesium: 1.6 mg/dL — ABNORMAL LOW (ref 1.7–2.4)

## 2019-08-28 MED ORDER — CEPHALEXIN 250 MG PO CAPS
250.0000 mg | ORAL_CAPSULE | Freq: Two times a day (BID) | ORAL | Status: AC
  Administered 2019-08-28 – 2019-08-30 (×6): 250 mg via ORAL
  Filled 2019-08-28 (×6): qty 1

## 2019-08-28 NOTE — Progress Notes (Signed)
Daily Progress Note   Patient Name: [name]       Date: 08/28/2019 DOB: Oct 22, 1924  Age: 86 y.o. MRN#: [id number] Attending Physician: Aline August, MD Primary Care Physician: Scheryl Marten, [place] Admit Date: 08/23/2019  Reason for Consultation/Follow-up: Establishing goals of care  Subjective: I met today with Ms. [name].  She was more alert and denied complaints today.  States pain is better since having stent placed.  Reports little appetite.    I then called and discussed with her daughter and son via phone.  Her son reports that they spoke with Dr. Tresa Moore today regarding next steps moving forward.  He is working to refer her to medical oncologist.    Goal for family has been to work to take her home with plan for follow-up with medical oncology and Dr. Tresa Moore for further evaluation for disease modifying therapy for her newly diagnosed urothelial cancer.  During my talk with him today, however, her daughter noted concern about her ability to care for her mother at home if she is not able to walk on her own.  It appears she is still requiring a great deal of assistance and is not independently ambulatory per PT evaluation today.  This evening, her children state that they may need to consider short-term rehab prior to transition home.  She had been to [facility] in the past, however, her son called and [facility] is not currently accepting new patients as they are working on Optometrist facility.  Antony Haste is also requesting further follow-up by outpatient palliative care for his mother to help with symptom management while she is attempting disease modifying therapy.  She has been having issues with nausea, poor appetite, and pain at home.  She is a [facility] patient and would therefore qualify for  outpatient palliative care follow-up through care connections program.  If she transitions home from the hospital, this could be started immediately.  If, however, she needs short-term rehab, this would not be started until she transition to her own home.  Length of Stay: 4  Current Medications: Scheduled Meds:  . carvedilol  6.25 mg Oral BID WC  . cephALEXin  250 mg Oral Q12H  . feeding supplement (ENSURE ENLIVE)  237 mL Oral BID BM  . heparin  5,000 Units Subcutaneous Q8H  . levothyroxine  25 mcg Oral QAC breakfast  . mouth rinse  15 mL Mouth Rinse BID  . polyethylene glycol  17 g Oral Daily  . senna-docusate  1 tablet Oral Daily  . simvastatin  40 mg Oral QPM  . sodium chloride flush  3 mL Intravenous Once    Continuous Infusions:   PRN Meds: acetaminophen **OR** acetaminophen, lip balm, ondansetron **OR** ondansetron (ZOFRAN) IV, traMADol  Physical Exam         General: Alert, awake, in no acute distress.  Tired appearing HEENT: No bruits, no goiter, no JVD Heart: Regular rate and rhythm. No murmur appreciated. Lungs: Good air movement, clear Abdomen: Soft, nontender, nondistended, positive bowel sounds.  Ext: No significant edema Skin: Warm and dry  Vital Signs: BP (!) 129/55 (BP Location: Right Arm)   Pulse 75   Temp (!) 97.5 F (36.4 C) (Oral)   Resp 16   Ht 5' (1.524 m)   Wt 62.9 kg   SpO2 96%   BMI 27.08 kg/m  SpO2: SpO2: 96 % O2 Device: O2 Device: Nasal Cannula O2 Flow Rate: O2 Flow Rate (L/min): 3 L/min  Intake/output summary:   Intake/Output Summary (Last 24 hours) at 08/28/2019 2312 Last data filed at 08/28/2019 1200 Gross per 24 hour  Intake 240 ml  Output 450 ml  Net -210 ml   LBM: Last BM Date: 08/22/19 Baseline Weight: Weight: 58.5 kg Most recent weight: Weight: 62.9 kg       Palliative Assessment/Data:    Flowsheet Rows     Most Recent Value  Intake Tab  Referral Department  Hospitalist  Unit at Time of Referral  Oncology Unit    Palliative Care Primary Diagnosis  Cancer  Date Notified  08/25/19  Palliative Care Type  New Palliative care  Reason for referral  Clarify Goals of Care  Date of Admission  08/23/19  Date first seen by Palliative Care  08/26/19  # of days Palliative referral response time  1 Day(s)  # of days IP prior to Palliative referral  2  Clinical Assessment  Palliative Performance Scale Score  60%  Psychosocial & Spiritual Assessment  Palliative Care Outcomes  Patient/Family meeting held?  Yes  Who was at the meeting?  Patient, daughter, son      Patient Active Problem List   Diagnosis Date Noted  . Cancer of renal pelvis, right ([place]) 08/27/2019  . AKI (acute kidney injury) ([place]) 08/24/2019  . Acute renal failure superimposed on stage 3b chronic kidney disease ([place]) 08/23/2019  . Type 2 diabetes mellitus with stage 3 chronic kidney disease ([place]) 08/23/2019  . Hyperlipidemia associated with type 2 diabetes mellitus ([place]) 08/23/2019  . Normocytic anemia 08/23/2019  . Hypothyroidism 08/23/2019  . Hydronephrosis of right kidney 08/23/2019  . Hyponatremia 08/23/2019  . Hip pain 04/22/2019  . Pain in left knee 06/07/2017  . Acute non-infective otitis externa of right ear 10/31/2016  . Impacted cerumen of both ears 12/27/2015  . Marginal perforation of tympanic membrane of right ear 12/27/2015  . Sensorineural hearing loss (SNHL), bilateral 12/27/2015  . S/P lumbar laminectomy 08/19/2015  . Ischemic cardiomyopathy   . Chronic systolic CHF (congestive heart failure) ([place]) 08/18/2011  . Pleural effusion 08/18/2011  . Cellulitis 08/18/2011  . Dyspnea 08/17/2011  . 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 Physical deconditioning 07/19/2011  . Cancer ([place]) 07/08/2011  . Acute anterior wall MI ([place]) 07/07/2011  . Myocardial infarction ([place]) 07/07/2011  . Hypertension associated with diabetes ([place])   .  Osteoarthritis of knee   . CAD (coronary artery disease)   . VSD  (ventricular septal defect)     Palliative Care Assessment & Plan   Patient Profile: 86 yo female with new renal papillary carcinoma  Recommendations/Plan:  Family is invested in plan for follow-up with Dr. Tresa Moore as well as medical oncology to discuss potential disease modifying therapy for newly diagnosed urothelial cancer.  Family has been wanting to work to transition home, however, her daughter tonight expressed concern about her ability to care for her mother if she is not able to be independently ambulatory.  Plan to reach out to PT tomorrow in order to see if we can better gauge her functional status and safety in home environment.  Recommend palliative care to follow with patient at discharge.  As she is a [facility] patient she would be candidate for care connections program through hospice of the [place].  I will call and discuss her case with them tomorrow.  Code Status:    Code Status Orders  (From admission, onward)         Start     Ordered   08/23/19 2338  Do not attempt resuscitation (DNR)  Continuous    Question Answer Comment  In the event of cardiac or respiratory ARREST Do not call a "code blue"   In the event of cardiac or respiratory ARREST Do not perform Intubation, CPR, defibrillation or ACLS   In the event of cardiac or respiratory ARREST Use medication by any route, position, wound care, and other measures to relive pain and suffering. May use oxygen, suction and manual treatment of airway obstruction as needed for comfort.      08/23/19 2338        Code Status History    Date Active Date Inactive Code Status Order ID Comments User Context   04/22/2019 2045 04/24/2019 1721 DNR [id number]  Truddie Hidden, MD Inpatient   07/17/2011 1340 07/18/2011 2016 Full Code [id number]  Grace Isaac, MD Inpatient   Advance Care Planning Activity    Advance Directive Documentation     Most Recent Value  Type of Advance Directive  Healthcare Power of Attorney, Living  will  Pre-existing out of facility DNR order (yellow form or pink MOST form)  --  "MOST" Form in Place?  --       Prognosis: Guarded  Discharge Planning:  Home with Home Health and recommendation for palliative care follow-up through care connections program  Care plan was discussed with patient son and daughter  Thank you for allowing the Palliative Medicine Team to assist in the care of this patient.   Time In: 1820 Time Out: 1850 Total Time 30 Prolonged Time Billed No      Greater than 50%  of this time was spent counseling and coordinating care related to the above assessment and plan.  Micheline Rough, MD  Please contact Palliative Medicine Team phone at [phone] for questions and concerns.

## 2019-08-28 NOTE — Progress Notes (Signed)
1 Day Post-Op   Subjective/Chief Complaint:   1 -Right Renal Pelvis / Ureteral Cancer- very large volume renal pelvis and peri-ureteral cancer by BX and stetn placement 08/2019 on eval Rt hydro and multifocal soft tissue densities in Rt ureter and renal pelvis area by non-contrast CT 08/2019 on evaluation of back pain (chornic). Some ? if hilar adenopathy. Appears 1 artery / 1 vein right renovascular anatomy, but no contrast studies to verify yet.   2 - Recurrent Cystitis - seeral episodes per year of simple cystitis. She is diabetic with PVR's 251mL range x many. Most recent CX e. coli senes keflex, cipro, nitro, RES gent. Also some klebsiella / penidng this admission. On Rocephin this admission.   3 -Stage 3 Renal Insuficiency - Cr 1.4's at bselien / GFR 30s. Lonh h/o DM2, HTN.   PMH sig for CAD/CABG/VSD Repair (Follows P. Nishan), VAg Hyst (benign), DM2 (A1c 6's), Frailty/Falls. She lives at [place] with her daughter [name], her [name] who lives in [place] is involved too. Her PCP is with [name].   Today " [name] " is seen stable. Less flank / pelvic pain after ureteral stent. Voiding with minimal hematuira.    Objective: Vital signs in last 24 hours: Temp:  [97.8 F (36.6 C)-99.1 F (37.3 C)] 98.2 F (36.8 C) (03/25 0528) Pulse Rate:  [79-100] 79 (03/25 0528) Resp:  [17-35] 17 (03/25 0528) BP: (111-169)/(58-108) 131/62 (03/25 0528) SpO2:  [91 %-98 %] 94 % (03/25 0528) Weight:  [58.5 kg-62.9 kg] 62.9 kg (03/25 0528) Last BM Date: 08/22/19  Intake/Output from previous day: 03/24 0701 - 03/25 0700 In: 100 [IV Piggyback:100] Out: 250 [Urine:250] Intake/Output this shift: No intake/output data recorded.   General appearance: alert and stigmata of functional deceline, very frail but pleasant.  Eyes: negative Nose: Nares normal. Septum midline. Mucosa normal. No drainage or sinus tenderness. Throat: lips, mucosa, and tongue normal; teeth and gums normal Neck: supple,  symmetrical, trachea midline Back: symmetric, no curvature. ROM normal. No CVA tenderness. Resp: non-labored on RA Cardio: No rate GI: soft, non-tender; bowel sounds normal; no masses,  no organomegaly Extremities: extremities normal, atraumatic, no cyanosis or edema Neurologic: Grossly normal  Lab Results:  Recent Labs    08/27/19 0525 08/28/19 0510  WBC 13.3* 13.6*  HGB 9.1* 8.8*  HCT 29.1* 28.2*  PLT 359 334   BMET Recent Labs    08/27/19 0525 08/28/19 0510  NA 134* 135  K 4.6 4.8  CL 109 108  CO2 15* 17*  GLUCOSE 152* 145*  BUN 26* 33*  CREATININE 1.90* 1.92*  CALCIUM 9.4 9.4   PT/INR No results for input(s): LABPROT, INR in the last 72 hours. ABG No results for input(s): PHART, HCO3 in the last 72 hours.  Invalid input(s): PCO2, PO2  Studies/Results: DG C-Arm 1-60 Min-No Report  Result Date: 08/27/2019 Fluoroscopy was utilized by the requesting physician.  No radiographic interpretation.    Anti-infectives: Anti-infectives (From admission, onward)   Start     Dose/Rate Route Frequency Ordered Stop   08/28/19 1000  cephALEXin (KEFLEX) capsule 250 mg     250 mg Oral Every 12 hours 08/28/19 0833 08/31/19 0959   08/27/19 0951  ceFAZolin (ANCEF) IVPB 2g/100 mL premix     2 g 200 mL/hr over 30 Minutes Intravenous 30 min pre-op 08/27/19 0951 08/27/19 1030   08/24/19 1530  cefTRIAXone (ROCEPHIN) 1 g in sodium chloride 0.9 % 100 mL IVPB  Status:  Discontinued  1 g 200 mL/hr over 30 Minutes Intravenous Every 24 hours 08/24/19 1528 08/28/19 [id number]      Assessment/Plan:  Now temporized with stent in terms of obstruction. I again feel pt not really candidate for any curative therapy given VERY HIGH change of micrometastatic disease and poor and declinign functional capacity.   OK for DC form GU perspective, and keep appt with [name] as scheudled 4/12.   [name] 08/28/2019

## 2019-08-28 NOTE — Progress Notes (Signed)
Patient ID: [name], female   DOB: [birth year], 86 y.o.   MRN: [id number]  PROGRESS NOTE    [name]  [id number] DOB: 10-Sep-1924 DOA: 08/23/2019 PCP: Scheryl Marten, PA   Brief Narrative:  86 year old female with history of CAD status post CABG, ICM, chronic systolic CHF with EF of 30 to 35% in 2015, chronic kidney disease stage III, hypertension, hyperlipidemia, hypothyroidism, VSD status post patch repair, diabetes mellitus type 2, anemia of chronic disease, history of thymoma status post resection and radiation presented with generalized weakness and decreased p.o. intake.  She has been following with urology as an outpatient and had plans for cystoscopy/ureteroscopy on 08/27/2019.  Preop labs on 08/21/2019 showed worsening renal function with creatinine of 2.58 compared to baseline of 1.4 along with hyponatremia of 126.  Patient was admitted for the same.  Assessment & Plan:   AKI on chronic kidney disease stage III -Most likely due to right-sided obstruction, moderate right hydronephrosis in addition to prerenal azotemia from dehydration -Renal ultrasound suggested minimal chronic component with normal echogenicity -Creatinine 1.92 today.  Baseline creatinine around 1.3-1.4 -Monitor.  DC IV fluids and encourage oral intake. 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  Status post cystoscopy/ureteroscopy on 08/28/2019 which revealed possible right renal pelvis cancer.  Patient underwent right ureteral stent placement as well at that time.  Not a candidate for surgical intervention as per urology.  Outpatient follow-up with urology  UTI -Urine culture growing Klebsiella pneumonia and E. coli.  We will switch to oral Ancef.  Chronic systolic heart failure CAD status post CABG with VSD patch at the time of bypass -Last EF of 30 to 35%  in 2015.  Diuretic and ARB on hold due to volume depletion and acute kidney injury. -Strict input output.  Daily weights. -No longer on aspirin due to fall/bleeding risk -Continue beta-blocker and statin  Hyponatremia -Improving.  Sodium 135 today.  Leukocytosis -Monitor  Hypertension -Continue Coreg.  Monitor blood pressure.  ARB/diuretic on hold  Diabetes mellitus type 2 -A1c 6.6.  Metformin on hold.  Hypomagnesemia-replace.  Repeat a.m. labs  Anemia of chronic disease -Probably from renal failure.  Hemoglobin stable.  Monitor  Hypothyroidism -Continue Synthroid  Hyperlipidemia -Continue statin  Generalized weakness Generalized deconditioning -Continue PT/OT -Palliative care following.   DVT prophylaxis: Heparin Code Status: DNR Family Communication: Spoke to daughter/son on phone on 08/28/2019 Disposition Plan: Patient is very deconditioned and will need PT evaluation today.  Family is very concerned about her current deconditioned state and is making preparations to bring her home in the next 1 to 2 days if she is able to tolerate physical therapy in the hospital.  Family is hesitant to think about SNF.  Consultants: Urology/palliative care  Procedures: Cystoscopy/ureteroscopy and right-sided stent placement on 08/27/2019 by urology  Antimicrobials:  Rocephin from 08/24/2019-08/27/2019 Ancef from 08/28/2019 onwards  Subjective: Patient seen and examined at bedside.  Awake, poor historian.  Denies worsening abdominal pain.  No overnight fever, vomiting or diarrhea reported. Objective: Vitals:   08/27/19 2102 08/28/19 0100 08/28/19 0528 08/28/19 1226  BP: (!) 153/65 129/62 131/62 (!) 124/48  Pulse: 90 88 79 89  Resp: 17 17 17 18   Temp: 98.6 F (37 C) 99.1 F (37.3 C) 98.2 F (36.8 C) 98.1 F (36.7 C)  TempSrc: Oral Axillary Oral Oral  SpO2: 96% 95% 94% 97%  Weight:   62.9 kg  Height:        Intake/Output Summary (Last 24 hours) at 08/28/2019 1234 Last  data filed at 08/27/2019 1257 Gross per 24 hour  Intake --  Output 250 ml  Net -250 ml   Filed Weights   08/26/19 0700 08/27/19 0958 08/28/19 0528  Weight: 58.5 kg 58.5 kg 62.9 kg    Examination:  General exam: No acute distress.  Elderly female lying in bed.  Poor historian.  Awake but slightly confused. Respiratory system: Bilateral decreased breath sounds at bases scattered rales.  No wheezing  cardiovascular system: Rate controlled, S1-S2 heard Gastrointestinal system: Abdomen is nondistended, soft and nontender. Normal bowel sounds heard. Extremities: No cyanosis, clubbing; trace lower extremity edema  Data Reviewed: I have personally reviewed following labs and imaging studies  CBC: Recent Labs  Lab 08/23/19 2158 08/24/19 0530 08/25/19 0814 08/27/19 0525 08/28/19 0510  WBC 12.1* 11.5* 8.8 13.3* 13.6*  NEUTROABS  --   --   --   --  12.2*  HGB 9.4* 8.8* 8.5* 9.1* 8.8*  HCT 29.6* 28.3* 26.8* 29.1* 28.2*  MCV 84.1 84.2 84.5 83.9 85.5  PLT 413* 371 341 359 [id number]   Basic Metabolic Panel: Recent Labs  Lab 08/24/19 0530 08/25/19 0814 08/26/19 0530 08/27/19 0525 08/28/19 0510  NA 132* 134* 132* 134* 135  K 4.8 4.7 4.6 4.6 4.8  CL 103 104 105 109 108  CO2 20* 20* 18* 15* 17*  GLUCOSE 127* 119* 154* 152* 145*  BUN 36* 31* 26* 26* 33*  CREATININE 2.31* 2.01* 1.88* 1.90* 1.92*  CALCIUM 9.5 9.1 9.3 9.4 9.4  MG  --   --   --   --  1.6*   GFR: Estimated Creatinine Clearance: 15.5 mL/min (A) (by C-G formula based on SCr of 1.92 mg/dL (H)). Liver Function Tests: Recent Labs  Lab 08/23/19 2158  AST 13*  ALT 12  ALKPHOS 78  BILITOT 0.6  PROT 6.6  ALBUMIN 3.2*   Recent Labs  Lab 08/23/19 2158  LIPASE 16   No results for input(s): AMMONIA in the last 168 hours. Coagulation Profile: No results for input(s): INR, PROTIME in the last 168 hours. Cardiac Enzymes: No results for input(s): CKTOTAL, CKMB, CKMBINDEX, TROPONINI in the last 168 hours. BNP (last 3  results) No results for input(s): PROBNP in the last 8760 hours. HbA1C: No results for input(s): HGBA1C in the last 72 hours. CBG: Recent Labs  Lab 08/24/19 1657 08/24/19 2010 08/25/19 0741 08/25/19 1201 08/27/19 1146  GLUCAP 129* 109* 110* 169* 142*   Lipid Profile: No results for input(s): CHOL, HDL, LDLCALC, TRIG, CHOLHDL, LDLDIRECT in the last 72 hours. Thyroid Function Tests: No results for input(s): TSH, T4TOTAL, FREET4, T3FREE, THYROIDAB in the last 72 hours. Anemia Panel: No results for input(s): VITAMINB12, FOLATE, FERRITIN, TIBC, IRON, RETICCTPCT in the last 72 hours. Sepsis Labs: No results for input(s): PROCALCITON, LATICACIDVEN in the last 168 hours.  Recent Results (from the past 240 hour(s))  SARS CORONAVIRUS 2 (TAT 6-24 HRS) Nasopharyngeal Nasopharyngeal Swab     Status: None   Collection Time: 08/23/19  1:21 PM   Specimen: Nasopharyngeal Swab  Result Value Ref Range Status   SARS Coronavirus 2 NEGATIVE NEGATIVE Final    Comment: (NOTE) SARS-CoV-2 target nucleic acids are NOT DETECTED. The SARS-CoV-2 RNA is generally detectable in upper and lower respiratory specimens during the acute phase of infection. Negative results do not preclude SARS-CoV-2 infection, do not rule out co-infections with other pathogens, and should not  be used as the sole basis for treatment or other patient management decisions. Negative results must be combined with clinical observations, patient history, and epidemiological information. The expected result is Negative. Fact Sheet for Patients: SugarRoll.be Fact Sheet for Healthcare Providers: https://www.woods-mathews.com/ This test is not yet approved or cleared by the [place] FDA and  has been authorized for detection and/or diagnosis of SARS-CoV-2 by FDA under an Emergency Use Authorization (EUA). This EUA will remain  in effect (meaning this test can be used) for the duration of  the COVID-19 declaration under Section 56 4(b)(1) of the Act, 21 U.S.C. section 360bbb-3(b)(1), unless the authorization is terminated or revoked sooner. Performed at [facility], [place] [address], [place], [place] [address]   Urine Culture     Status: Abnormal   Collection Time: 08/24/19 11:16 AM   Specimen: Urine, Clean Catch  Result Value Ref Range Status   Specimen Description   Final    URINE, CLEAN CATCH Performed at [facility], [place] [address] [place], [place] [address]    Special Requests   Final    NONE Performed at [facility] [place] [address] [place], [place] [address]    Culture (A)  Final    >=100,000 COLONIES/mL KLEBSIELLA PNEUMONIAE >=100,000 COLONIES/mL ESCHERICHIA COLI    Report Status 08/27/2019 FINAL  Final   Organism ID, Bacteria KLEBSIELLA PNEUMONIAE (A)  Final   Organism ID, Bacteria ESCHERICHIA COLI (A)  Final      Susceptibility   Escherichia coli - MIC*    AMPICILLIN 8 SENSITIVE Sensitive     CEFAZOLIN <=4 SENSITIVE Sensitive     CEFTRIAXONE <=0.25 SENSITIVE Sensitive     CIPROFLOXACIN <=0.25 SENSITIVE Sensitive     GENTAMICIN >=16 RESISTANT Resistant     IMIPENEM <=0.25 SENSITIVE Sensitive     NITROFURANTOIN <=16 SENSITIVE Sensitive     TRIMETH/SULFA <=20 SENSITIVE Sensitive     AMPICILLIN/SULBACTAM 4 SENSITIVE Sensitive     PIP/TAZO <=4 SENSITIVE Sensitive     * >=100,000 COLONIES/mL ESCHERICHIA COLI   Klebsiella pneumoniae - MIC*    AMPICILLIN >=32 RESISTANT Resistant     CEFAZOLIN <=4 SENSITIVE Sensitive     CEFTRIAXONE <=0.25 SENSITIVE Sensitive     CIPROFLOXACIN <=0.25 SENSITIVE Sensitive     GENTAMICIN <=1 SENSITIVE Sensitive     IMIPENEM <=0.25 SENSITIVE Sensitive     NITROFURANTOIN 64 INTERMEDIATE Intermediate     TRIMETH/SULFA <=20 SENSITIVE Sensitive     AMPICILLIN/SULBACTAM 8 SENSITIVE Sensitive     PIP/TAZO <=4 SENSITIVE Sensitive     * >=100,000 COLONIES/mL KLEBSIELLA PNEUMONIAE    Surgical pcr screen     Status: None   Collection Time: 08/26/19 10:42 PM   Specimen: Nasal Mucosa; Nasal Swab  Result Value Ref Range Status   MRSA, PCR NEGATIVE NEGATIVE Final   Staphylococcus aureus NEGATIVE NEGATIVE Final    Comment: (NOTE) The Xpert SA Assay (FDA approved for NASAL specimens in patients 75 years of age and older), is one component of a comprehensive surveillance program. It is not intended to diagnose infection nor to guide or monitor treatment. Performed at [facility], [place] [address] [place], [place] [address]          Radiology Studies: DG C-Arm 1-60 Min-No Report  Result Date: 08/27/2019 Fluoroscopy was utilized by the requesting physician.  No radiographic interpretation.        Scheduled Meds: . carvedilol  6.25 mg Oral BID WC  .  cephALEXin  250 mg Oral Q12H  . feeding supplement (ENSURE ENLIVE)  237 mL Oral BID BM  . heparin  5,000 Units Subcutaneous Q8H  . levothyroxine  25 mcg Oral QAC breakfast  . mouth rinse  15 mL Mouth Rinse BID  . polyethylene glycol  17 g Oral Daily  . senna-docusate  1 tablet Oral Daily  . simvastatin  40 mg Oral QPM  . sodium chloride flush  3 mL Intravenous Once   Continuous Infusions:         Aline August, MD [facility] 08/28/2019, 12:34 PM

## 2019-08-28 NOTE — Progress Notes (Signed)
Physical Therapy Treatment Patient Details Name: [name] MRN: [id number] DOB: 06-Apr-1925 Today's Date: 08/28/2019    History of Present Illness [name] is a 86 y.o. female with medical history significant for CAD s/p CABG, ICM, chronic systolic CHF (EF [id number] in 2015), CKD stage III, hypertension, hyperlipidemia, hypothyroidism, VSD s/p patch repair, type 2 diabetes, anemia of chronic disease, and history of thymoma s/p resection and radiation who presents to the ED for evaluation of generalized weakness.    PT Comments    Patient making gradual progress with therapy and was highly motivated to attempt bed to chair transfer this date. She required mod assist to sit up to EOB today with cues to reach for bed rail to maintain seated balance initially. Patient performed ~ 5x sit<>stand from EOB with small side steps along bed in attempt to complete stand step transfer to recliner. Pt limited by fatigue and required mod-max assist to return to supine. She will continue to benefit from skilled PT Interventions to address impairments and progress towards goals. Acute PT will progress as able.   Follow Up Recommendations  Home health PT;Supervision/Assistance - 24 hour(HHPT with 24/7 from family, if not pt would benefit from SNF)     Equipment Recommendations  None recommended by PT    Recommendations for Other Services       Precautions / Restrictions Precautions Precautions: Fall Precaution Comments: Monitor skin and joint integrity closely- frail and prone to skin tears and bruising. Restrictions Weight Bearing Restrictions: No    Mobility  Bed Mobility Overal bed mobility: Needs Assistance Bed Mobility: Rolling;Supine to Sit;Sit to Supine Rolling: Mod assist   Supine to sit: Mod assist;Max assist;HOB elevated Sit to supine: Mod assist;Max assist   General bed mobility comments: Pt required increased assistance for mobility today. Mod-max assist for all bed mobility  with verbal cues and assist to sequenicng reaching for bed rail, assist to initiate trunk rotation, and to walk LE's to EOB. Pt required max assist to return to supine as she was fatigued from transfers. Mod-Max assist required to scoot backwards while sitting EOB in preparation for transfer.  Transfers Overall transfer level: Needs assistance Equipment used: Rolling walker (2 wheeled) Transfers: Sit to/from [facility] Sit to Stand: From elevated surface;Mod assist Stand pivot transfers: Mod assist       General transfer comment: pt required verbal cues and assist for hand placement to perform proper technique to initiate power up. pt performed ~5x sit to stand from EOB with seated rest between as she was eager to attempt stepping to recliner however after 1-2 small steps along EOB, pt returned to sitting due to fatigue.   Ambulation/Gait          Stairs             Wheelchair Mobility    Modified Rankin (Stroke Patients Only)       Balance Overall balance assessment: Needs assistance Sitting-balance support: Bilateral upper extremity supported;Feet unsupported Sitting balance-Leahy Scale: Fair     Standing balance support: Bilateral upper extremity supported;During functional activity Standing balance-Leahy Scale: Poor Standing balance comment: reliant on therapist assist and UE support         Cognition Arousal/Alertness: Awake/alert Behavior During Therapy: WFL for tasks assessed/performed Overall Cognitive Status: No family/caregiver present to determine baseline cognitive functioning                      Pertinent Vitals/Pain Pain Assessment: No/denies pain  PT Goals (current goals can now be found in the care plan section) Acute Rehab PT Goals Patient Stated Goal: return home PT Goal Formulation: With patient Time For Goal Achievement: 09/07/19 Potential to Achieve Goals: Fair Progress towards PT goals: Not  progressing toward goals - comment    Frequency    Min 3X/week      PT Plan Current plan remains appropriate       AM-PAC PT "6 Clicks" Mobility   Outcome Measure  Help needed turning from your back to your side while in a flat bed without using bedrails?: A Lot Help needed moving from lying on your back to sitting on the side of a flat bed without using bedrails?: A Lot Help needed moving to and from a bed to a chair (including a wheelchair)?: A Lot Help needed standing up from a chair using your arms (e.g., wheelchair or bedside chair)?: A Lot Help needed to walk in hospital room?: A Lot Help needed climbing 3-5 steps with a railing? : Total 6 Click Score: 11    End of Session Equipment Utilized During Treatment: Gait belt Activity Tolerance: Patient limited by fatigue Patient left: in bed;with call bell/phone within reach;with bed alarm set;with nursing/sitter in room Nurse Communication: Mobility status PT Visit Diagnosis: Unsteadiness on feet (R26.81);Muscle weakness (generalized) (M62.81);Difficulty in walking, not elsewhere classified (R26.2)     Time: [id number] PT Time Calculation (min) (ACUTE ONLY): 44 min  Charges:  $Therapeutic Activity: 38-52 mins                     Verner Mould, DPT Physical Therapist with [facility] [phone]  08/28/2019 12:34 PM

## 2019-08-28 NOTE — Progress Notes (Signed)
This Patient is a pleasant lady who's family wish to cancel planned discharge for tomorrow. Patient's son wish to speak to Urology and oncology as to what is the best course moving forward towards patient's care. I've have spoken to Dr. Tresa Moore and said will call son later in the day.

## 2019-08-28 NOTE — Care Management Important Message (Signed)
Important Message  Patient Details IM Letter given to Marney Doctor RN Case Manager to present to the Patient Name: [name] MRN: [id number] Date of Birth: [birth year]   Medicare Important Message Given:  Yes     Kerin Salen 08/28/2019, 9:54 AM

## 2019-08-29 ENCOUNTER — Telehealth: Payer: Self-pay | Admitting: Oncology

## 2019-08-29 LAB — BASIC METABOLIC PANEL
Anion gap: 9 (ref 5–15)
BUN: 45 mg/dL — ABNORMAL HIGH (ref 8–23)
CO2: 16 mmol/L — ABNORMAL LOW (ref 22–32)
Calcium: 9.7 mg/dL (ref 8.9–10.3)
Chloride: 111 mmol/L (ref 98–111)
Creatinine, Ser: 1.96 mg/dL — ABNORMAL HIGH (ref 0.44–1.00)
GFR calc Af Amer: 25 mL/min — ABNORMAL LOW (ref >60)
GFR calc non Af Amer: 22 mL/min — ABNORMAL LOW (ref >60)
Glucose, Bld: 136 mg/dL — ABNORMAL HIGH (ref 70–99)
Potassium: 4.6 mmol/L (ref 3.5–5.1)
Sodium: 136 mmol/L (ref 135–145)

## 2019-08-29 LAB — GLUCOSE, CAPILLARY: Glucose-Capillary: 217 mg/dL — ABNORMAL HIGH (ref 70–99)

## 2019-08-29 LAB — MAGNESIUM: Magnesium: 1.8 mg/dL (ref 1.7–2.4)

## 2019-08-29 MED ORDER — SODIUM BICARBONATE-DEXTROSE 150-5 MEQ/L-% IV SOLN
150.0000 meq | INTRAVENOUS | Status: DC
  Administered 2019-08-29: 150 meq via INTRAVENOUS
  Filled 2019-08-29 (×2): qty 1000

## 2019-08-29 MED ORDER — ENSURE ENLIVE PO LIQD
237.0000 mL | Freq: Three times a day (TID) | ORAL | Status: DC
  Administered 2019-08-29 – 2019-09-06 (×20): 237 mL via ORAL

## 2019-08-29 NOTE — Progress Notes (Signed)
Daily Progress Note   Patient Name: [name]       Date: 08/29/2019 DOB: 08-18-1924  Age: 86 y.o. MRN#: [id number] Attending Physician: Aline August, MD Primary Care Physician: Scheryl Marten, [place] Admit Date: 08/23/2019  Reason for Consultation/Follow-up: Establishing goals of care  Subjective: I met today with [name].  She was sitting in bedside chair and denied complaints today.  States pain is better since having stent placed.    We discussed appetite and she reports that it is still "down" but that she is hungry now and would like to try to eat.  Has also been working to drink supplements as offered today.  Attempted to call daughter, and I left VM that I will attempt to touch base again tomorrow to see if any questions.  Length of Stay: 5  Current Medications: Scheduled Meds:  . carvedilol  6.25 mg Oral BID WC  . cephALEXin  250 mg Oral Q12H  . feeding supplement (ENSURE ENLIVE)  237 mL Oral TID BM  . heparin  5,000 Units Subcutaneous Q8H  . levothyroxine  25 mcg Oral QAC breakfast  . mouth rinse  15 mL Mouth Rinse BID  . polyethylene glycol  17 g Oral Daily  . senna-docusate  1 tablet Oral Daily  . simvastatin  40 mg Oral QPM  . sodium chloride flush  3 mL Intravenous Once    Continuous Infusions: . sodium bicarbonate 150 mEq in dextrose 5% 1000 mL 75 mL/hr at 08/29/19 1502    PRN Meds: acetaminophen **OR** acetaminophen, lip balm, ondansetron **OR** ondansetron (ZOFRAN) IV, traMADol  Physical Exam         General: Alert, awake, in no acute distress.  Tired appearing.  Sitting in bedside chair Heart: Regular rate and rhythm. No murmur appreciated. Lungs: Good air movement, clear Abdomen: Soft, nontender, nondistended, positive bowel sounds.  Ext: No  significant edema Skin: Warm and dry  Vital Signs: BP (!) 143/59 (BP Location: Left Arm)   Pulse 73   Temp 98.1 F (36.7 C) (Oral)   Resp 16   Ht 5' (1.524 m)   Wt 66.9 kg   SpO2 97%   BMI 28.80 kg/m  SpO2: SpO2: 97 % O2 Device: O2 Device: Nasal Cannula O2 Flow Rate: O2 Flow Rate (L/min): 3 L/min  Intake/output  summary:   Intake/Output Summary (Last 24 hours) at 08/29/2019 1731 Last data filed at 08/29/2019 1502 Gross per 24 hour  Intake 210.53 ml  Output -  Net 210.53 ml   LBM: Last BM Date: 08/22/19 Baseline Weight: Weight: 58.5 kg Most recent weight: Weight: 66.9 kg       Palliative Assessment/Data:    Flowsheet Rows     Most Recent Value  Intake Tab  Referral Department  Hospitalist  Unit at Time of Referral  Oncology Unit  Palliative Care Primary Diagnosis  Cancer  Date Notified  08/25/19  Palliative Care Type  New Palliative care  Reason for referral  Clarify Goals of Care  Date of Admission  08/23/19  Date first seen by Palliative Care  08/26/19  # of days Palliative referral response time  1 Day(s)  # of days IP prior to Palliative referral  2  Clinical Assessment  Palliative Performance Scale Score  60%  Psychosocial & Spiritual Assessment  Palliative Care Outcomes  Patient/Family meeting held?  Yes  Who was at the meeting?  Patient, daughter, son      Patient Active Problem List   Diagnosis Date Noted  . Cancer of renal pelvis, right ([place]) 08/27/2019  . AKI (acute kidney injury) ([place]) 08/24/2019  . Acute renal failure superimposed on stage 3b chronic kidney disease ([place]) 08/23/2019  . Type 2 diabetes mellitus with stage 3 chronic kidney disease ([place]) 08/23/2019  . Hyperlipidemia associated with type 2 diabetes mellitus ([place]) 08/23/2019  . Normocytic anemia 08/23/2019  . Hypothyroidism 08/23/2019  . Hydronephrosis of right kidney 08/23/2019  . Hyponatremia 08/23/2019  . Hip pain 04/22/2019  . Pain in left knee 06/07/2017  . Acute  non-infective otitis externa of right ear 10/31/2016  . Impacted cerumen of both ears 12/27/2015  . Marginal perforation of tympanic membrane of right ear 12/27/2015  . Sensorineural hearing loss (SNHL), bilateral 12/27/2015  . S/P lumbar laminectomy 08/19/2015  . Ischemic cardiomyopathy   . Chronic systolic CHF (congestive heart failure) ([place]) 08/18/2011  . Pleural effusion 08/18/2011  . Cellulitis 08/18/2011  . Dyspnea 08/17/2011  . 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 Physical deconditioning 07/19/2011  . Cancer ([place]) 07/08/2011  . Acute anterior wall MI ([place]) 07/07/2011  . Myocardial infarction ([place]) 07/07/2011  . Hypertension associated with diabetes ([place])   . Osteoarthritis of knee   . CAD (coronary artery disease)   . VSD (ventricular septal defect)     Palliative Care Assessment & Plan   Patient Profile: 86 yo female with new renal papillary carcinoma  Recommendations/Plan:  Plan is for follow-up with medical oncology to discuss potential disease modifying therapy for newly diagnosed urothelial cancer.  Per telephone note in chart, appointment set for [name] on 4/2.  Family had been wanting to work to transition home, however, her daughter cannot care for her if she is not able to be independently ambulatory.  TOC working with family for options for SNF for short term rehab.  Recommend palliative care to follow with patient at discharge.  As she is a [facility] patient she is candidate for care connections program through [facility] [place].  I called and discussed with liaison and they will follow [name] once she transitions home (their program does not follow patients at [facility]).  Code Status:    Code Status Orders  (From admission, onward)         Start     Ordered   08/23/19  2338  Do not attempt resuscitation (DNR)  Continuous    Question Answer Comment  In the event of cardiac or respiratory ARREST Do not call a "code blue"   In  the event of cardiac or respiratory ARREST Do not perform Intubation, CPR, defibrillation or ACLS   In the event of cardiac or respiratory ARREST Use medication by any route, position, wound care, and other measures to relive pain and suffering. May use oxygen, suction and manual treatment of airway obstruction as needed for comfort.      08/23/19 2338        Code Status History    Date Active Date Inactive Code Status Order ID Comments User Context   04/22/2019 2045 04/24/2019 1721 DNR [id number]  Truddie Hidden, MD Inpatient   07/17/2011 1340 07/18/2011 2016 Full Code [id number]  Grace Isaac, MD Inpatient   Advance Care Planning Activity    Advance Directive Documentation     Most Recent Value  Type of Advance Directive  Healthcare Power of [place], Living will  Pre-existing out of facility DNR order (yellow form or pink MOST form)  -  "MOST" Form in Place?  -       Prognosis: Guarded  Discharge Planning:  Home with Home Health and recommendation for palliative care follow-up through care connections program  Care plan was discussed with patient son and daughter  Thank you for allowing the Palliative Medicine Team to assist in the care of this patient.   Time In: 1710 Time Out: 1730 Total Time 20 Prolonged Time Billed No      Greater than 50%  of this time was spent counseling and coordinating care related to the above assessment and plan.  Micheline Rough, MD  Please contact Palliative Medicine Team phone at [phone] for questions and concerns.

## 2019-08-29 NOTE — Progress Notes (Signed)
Nutrition Follow-up  DOCUMENTATION CODES:   Not applicable  INTERVENTION:  Increase Ensure Enlive po TID, each supplement provides 350 kcal and 20 grams of protein  Continue Magic cup TID with meals, each supplement provides 290 kcal and 9 grams of protein    NUTRITION DIAGNOSIS:   Inadequate oral intake related to lethargy/confusion, decreased appetite as evidenced by per patient/family report.  Ongoing, addressing via ONS  GOAL:   Patient will meet greater than or equal to 90% of their needs  Progressing  MONITOR:   PO intake, Weight trends, Supplement acceptance, Labs, I & O's  REASON FOR ASSESSMENT:   Malnutrition Screening Tool    ASSESSMENT:  RD working remotely.  86 year old female with past medical history of CAD s/p CABG, ICM, chronic systolic CHF (EF [id number] in 2015), CKD stage III, HTN, hypothyroidism, VSD s/p patch repair, DM2, anemia of chronic disease, history of thymoma s/p resection and radiation who presented to ED for generalized weakness and decreased po intake for the previous week.  Patient admitted on 3/21 for acute renal failure superimposed on stage 3b CKD  Per notes: -bicarb drip today  -possible right renal pelvis cancer s/p 3/25 cystoscopy/ureteroscpy with right ureteral stent placement -urine culture growing Klebsiella pneumonia and E coli, currently on oral Keflex -diuretic and ARB on hold d/t volume depletion and AKI -hyponatremia improved  Patient noted very deconditioned, could hardly stand for PT today and short-term rehab recommended. Family to make decision over the weekend.   Patient is on a regular diet, per flowsheets she has eaten 25% of the last documented meal on 3/25. Patient is accepting of Ensure supplement BID per medication review which provides 700 kcal and 40 grams of protein daily with 100% intake. Will supplement to TID to aid with poor meal intake.   I/Os: +3024 ml since admit      -90 ml x 24 hrs UOP: 450 ml x 24  hrs IVF: sodium bicarbonate 150 mEq in D5 @ 75 ml/hr  Medications reviewed and include: Miralax, Senokot Labs: CBGs 140-142  Diet Order:   Diet Order            Diet regular Room service appropriate? Yes; Fluid consistency: Thin; Fluid restriction: 1500 mL Fluid  Diet effective now              EDUCATION NEEDS:   No education needs have been identified at this time  Skin:  Skin Assessment: Skin Integrity Issues: Skin Integrity Issues:: Stage I Stage I: sacrum  Last BM:  3/19  Height:   Ht Readings from Last 1 Encounters:  08/27/19 5' (1.524 m)    Weight:   Wt Readings from Last 1 Encounters:  08/29/19 66.9 kg    Ideal Body Weight:  45.5 kg  BMI:  Body mass index is 28.8 kg/m.  Estimated Nutritional Needs:   Kcal:  1200-1400  Protein:  60-70  Fluid:  >/= 1.2 L/day   Lajuan Lines, RD, LDN Clinical Nutrition After Hours/Weekend Pager # in [place]

## 2019-08-29 NOTE — Progress Notes (Signed)
Patient ID: [name], female   DOB: Apr 04, 1925, 86 y.o.   MRN: [id number]  PROGRESS NOTE    VIVIA HOXIE  [id number] DOB: [birth year] DOA: 08/23/2019 PCP: Scheryl Marten, PA   Brief Narrative:  86 year old female with history of CAD status post CABG, ICM, chronic systolic CHF with EF of 30 to 35% in 2015, chronic kidney disease stage III, hypertension, hyperlipidemia, hypothyroidism, VSD status post patch repair, diabetes mellitus type 2, anemia of chronic disease, history of thymoma status post resection and radiation presented with generalized weakness and decreased p.o. intake.  She has been following with urology as an outpatient and had plans for cystoscopy/ureteroscopy on 08/27/2019.  Preop labs on 08/21/2019 showed worsening renal function with creatinine of 2.58 compared to baseline of 1.4 along with hyponatremia of 126.  Patient was admitted for the same.  Assessment & Plan:   AKI on chronic kidney disease stage III -Most likely due to right-sided obstruction, moderate right hydronephrosis in addition to prerenal azotemia from dehydration -Renal ultrasound suggested minimal chronic component with normal echogenicity -Creatinine 1.96 today.  Baseline creatinine around 1.3-1.4 -Monitor.  Bicarb level of 16 today.  Will treat with bicarb drip for today.  Repeat a.m. labs. 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  Status post cystoscopy/ureteroscopy on 08/28/2019 which revealed possible right renal pelvis cancer.  Patient underwent right ureteral stent placement as well at that time.  Not a candidate for surgical intervention as per urology.  Outpatient follow-up with urology  UTI -Urine culture growing Klebsiella pneumonia and E. coli.  Currently on oral Keflex: Complete total of 7-day course of therapy.  Chronic systolic heart  failure CAD status post CABG with VSD patch at the time of bypass -Last EF of 30 to 35% in 2015.  Diuretic and ARB on hold due to volume depletion and acute kidney injury. -Strict input output.  Daily weights. -No longer on aspirin due to fall/bleeding risk -Continue beta-blocker and statin  Hyponatremia -Improved.  Sodium 136 today.  Leukocytosis -Monitor  Hypertension -Continue Coreg.  Monitor blood pressure.  ARB/diuretic on hold  Diabetes mellitus type 2 -A1c 6.6.  Metformin on hold.  Hypomagnesemia-improved  Anemia of chronic disease -Probably from renal failure.  Hemoglobin stable.  Monitor  Hypothyroidism -Continue Synthroid  Hyperlipidemia -Continue statin  Generalized weakness Generalized deconditioning -Patient is very deconditioned and hardly could stand up with PT today.  I think she should be appropriate for short-term rehab.  Family is agreeable for the same.  Consult Education officer, museum for the same. -Palliative care following.  Patient will benefit from outpatient palliative care follow-up once she gets discharged from SNF.   DVT prophylaxis: Heparin Code Status: DNR Family Communication: Spoke to daughter/Nina on phone on 08/29/2019 Disposition Plan: Patient is very deconditioned will need short-term rehab placement.  Will give 1 more day of IV bicarb drip and subsequently patient should be ready medically to go to SNF. Consultants: Urology/palliative care  Procedures: Cystoscopy/ureteroscopy and right-sided stent placement on 08/27/2019 by urology  Antimicrobials:  Rocephin from 08/24/2019-08/27/2019 Keflex from 08/28/2019 onwards  Subjective: Patient seen and examined at bedside.  Awake, poor historian.  Feels very weak and tired.  No overnight fever, vomiting, worsening shortness of breath reported.   Objective: Vitals:   08/28/19 0528 08/28/19 1226 08/28/19 2116 08/29/19 0554  BP: 131/62 (!) 124/48 (!) 129/55 (!) 137/112  Pulse: 79 89 75 84  Resp:  17  18 16 15   Temp: 98.2 F (36.8 C) 98.1 F (36.7 C) (!) 97.5 F (36.4 C) 98.2 F (36.8 C)  TempSrc: Oral Oral Oral Oral  SpO2: 94% 97% 96% 92%  Weight: 62.9 kg   66.9 kg  Height:        Intake/Output Summary (Last 24 hours) at 08/29/2019 1111 Last data filed at 08/29/2019 0600 Gross per 24 hour  Intake 360 ml  Output 450 ml  Net -90 ml   Filed Weights   08/27/19 0958 08/28/19 0528 08/29/19 0554  Weight: 58.5 kg 62.9 kg 66.9 kg    Examination:  General exam: No distress.  Elderly female lying in bed.  Poor historian.  Awake but still slightly confused.   Respiratory system: Bilateral decreased breath sounds at bases with some scattered crackles.   Cardiovascular system: Rate controlled, S1-S2 heard Gastrointestinal system: Abdomen is nondistended, soft and nontender. Normal bowel sounds heard. Extremities: Trace lower extremity edema.  No cyanosis.  Data Reviewed: I have personally reviewed following labs and imaging studies  CBC: Recent Labs  Lab 08/23/19 2158 08/24/19 0530 08/25/19 0814 08/27/19 0525 08/28/19 0510  WBC 12.1* 11.5* 8.8 13.3* 13.6*  NEUTROABS  --   --   --   --  12.2*  HGB 9.4* 8.8* 8.5* 9.1* 8.8*  HCT 29.6* 28.3* 26.8* 29.1* 28.2*  MCV 84.1 84.2 84.5 83.9 85.5  PLT 413* 371 341 359 [id number]   Basic Metabolic Panel: Recent Labs  Lab 08/25/19 0814 08/26/19 0530 08/27/19 0525 08/28/19 0510 08/29/19 0548  NA 134* 132* 134* 135 136  K 4.7 4.6 4.6 4.8 4.6  CL 104 105 109 108 111  CO2 20* 18* 15* 17* 16*  GLUCOSE 119* 154* 152* 145* 136*  BUN 31* 26* 26* 33* 45*  CREATININE 2.01* 1.88* 1.90* 1.92* 1.96*  CALCIUM 9.1 9.3 9.4 9.4 9.7  MG  --   --   --  1.6* 1.8   GFR: Estimated Creatinine Clearance: 15.6 mL/min (A) (by C-G formula based on SCr of 1.96 mg/dL (H)). Liver Function Tests: Recent Labs  Lab 08/23/19 2158  AST 13*  ALT 12  ALKPHOS 78  BILITOT 0.6  PROT 6.6  ALBUMIN 3.2*   Recent Labs  Lab 08/23/19 2158  LIPASE 16   No  results for input(s): AMMONIA in the last 168 hours. Coagulation Profile: No results for input(s): INR, PROTIME in the last 168 hours. Cardiac Enzymes: No results for input(s): CKTOTAL, CKMB, CKMBINDEX, TROPONINI in the last 168 hours. BNP (last 3 results) No results for input(s): PROBNP in the last 8760 hours. HbA1C: No results for input(s): HGBA1C in the last 72 hours. CBG: Recent Labs  Lab 08/24/19 2010 08/25/19 0741 08/25/19 1201 08/27/19 1146 08/28/19 2120  GLUCAP 109* 110* 169* 142* 140*   Lipid Profile: No results for input(s): CHOL, HDL, LDLCALC, TRIG, CHOLHDL, LDLDIRECT in the last 72 hours. Thyroid Function Tests: No results for input(s): TSH, T4TOTAL, FREET4, T3FREE, THYROIDAB in the last 72 hours. Anemia Panel: No results for input(s): VITAMINB12, FOLATE, FERRITIN, TIBC, IRON, RETICCTPCT in the last 72 hours. Sepsis Labs: No results for input(s): PROCALCITON, LATICACIDVEN in the last 168 hours.  Recent Results (from the past 240 hour(s))  SARS CORONAVIRUS 2 (TAT 6-24 HRS) Nasopharyngeal Nasopharyngeal Swab     Status: None   Collection Time: 08/23/19  1:21 PM   Specimen: Nasopharyngeal Swab  Result Value Ref Range Status   SARS Coronavirus 2 NEGATIVE NEGATIVE Final  Comment: (NOTE) SARS-CoV-2 target nucleic acids are NOT DETECTED. The SARS-CoV-2 RNA is generally detectable in upper and lower respiratory specimens during the acute phase of infection. Negative results do not preclude SARS-CoV-2 infection, do not rule out co-infections with other pathogens, and should not be used as the sole basis for treatment or other patient management decisions. Negative results must be combined with clinical observations, patient history, and epidemiological information. The expected result is Negative. Fact Sheet for Patients: SugarRoll.be Fact Sheet for Healthcare Providers: https://www.woods-mathews.com/ This test is not yet  approved or cleared by the [place] FDA and  has been authorized for detection and/or diagnosis of SARS-CoV-2 by FDA under an Emergency Use Authorization (EUA). This EUA will remain  in effect (meaning this test can be used) for the duration of the COVID-19 declaration under Section 56 4(b)(1) of the Act, 21 U.S.C. section 360bbb-3(b)(1), unless the authorization is terminated or revoked sooner. Performed at [facility], [place] [address] [place], [place] [address]   Urine Culture     Status: Abnormal   Collection Time: 08/24/19 11:16 AM   Specimen: Urine, Clean Catch  Result Value Ref Range Status   Specimen Description   Final    URINE, CLEAN CATCH Performed at [facility], [place] [address], [place], [place] [address]    Special Requests   Final    NONE Performed at [facility], [place] [address] [place], [place] [address]    Culture (A)  Final    >=100,000 COLONIES/mL KLEBSIELLA PNEUMONIAE >=100,000 COLONIES/mL ESCHERICHIA COLI    Report Status 08/27/2019 FINAL  Final   Organism ID, Bacteria KLEBSIELLA PNEUMONIAE (A)  Final   Organism ID, Bacteria ESCHERICHIA COLI (A)  Final      Susceptibility   Escherichia coli - MIC*    AMPICILLIN 8 SENSITIVE Sensitive     CEFAZOLIN <=4 SENSITIVE Sensitive     CEFTRIAXONE <=0.25 SENSITIVE Sensitive     CIPROFLOXACIN <=0.25 SENSITIVE Sensitive     GENTAMICIN >=16 RESISTANT Resistant     IMIPENEM <=0.25 SENSITIVE Sensitive     NITROFURANTOIN <=16 SENSITIVE Sensitive     TRIMETH/SULFA <=20 SENSITIVE Sensitive     AMPICILLIN/SULBACTAM 4 SENSITIVE Sensitive     PIP/TAZO <=4 SENSITIVE Sensitive     * >=100,000 COLONIES/mL ESCHERICHIA COLI   Klebsiella pneumoniae - MIC*    AMPICILLIN >=32 RESISTANT Resistant     CEFAZOLIN <=4 SENSITIVE Sensitive     CEFTRIAXONE <=0.25 SENSITIVE Sensitive     CIPROFLOXACIN <=0.25 SENSITIVE Sensitive     GENTAMICIN <=1 SENSITIVE Sensitive     IMIPENEM <=0.25  SENSITIVE Sensitive     NITROFURANTOIN 64 INTERMEDIATE Intermediate     TRIMETH/SULFA <=20 SENSITIVE Sensitive     AMPICILLIN/SULBACTAM 8 SENSITIVE Sensitive     PIP/TAZO <=4 SENSITIVE Sensitive     * >=100,000 COLONIES/mL KLEBSIELLA PNEUMONIAE  Surgical pcr screen     Status: None   Collection Time: 08/26/19 10:42 PM   Specimen: Nasal Mucosa; Nasal Swab  Result Value Ref Range Status   MRSA, PCR NEGATIVE NEGATIVE Final   Staphylococcus aureus NEGATIVE NEGATIVE Final    Comment: (NOTE) The Xpert SA Assay (FDA approved for NASAL specimens in patients 4 years of age and older), is one component of a comprehensive surveillance program. It is not intended to diagnose infection nor to guide or monitor treatment. Performed at [facility], [place] [address] [place], [place] [address]  Radiology Studies: DG C-Arm 1-60 Min-No Report  Result Date: 08/27/2019 Fluoroscopy was utilized by the requesting physician.  No radiographic interpretation.        Scheduled Meds: . carvedilol  6.25 mg Oral BID WC  . cephALEXin  250 mg Oral Q12H  . feeding supplement (ENSURE ENLIVE)  237 mL Oral BID BM  . heparin  5,000 Units Subcutaneous Q8H  . levothyroxine  25 mcg Oral QAC breakfast  . mouth rinse  15 mL Mouth Rinse BID  . polyethylene glycol  17 g Oral Daily  . senna-docusate  1 tablet Oral Daily  . simvastatin  40 mg Oral QPM  . sodium chloride flush  3 mL Intravenous Once   Continuous Infusions:         Aline August, MD [facility] 08/29/2019, 11:11 AM

## 2019-08-29 NOTE — Telephone Encounter (Signed)
Received a new hem referral from Dr. Tresa Moore for right renal pelvis cancer. I cld and spoke to Mrs. Manness' daughter, [name], and scheduled the pt to see Dr. Alen Blew on 4/2 at [place] to arrive 15 minutes early.

## 2019-08-29 NOTE — NC FL2 (Signed)
[place] MEDICAID FL2 LEVEL OF CARE SCREENING TOOL     IDENTIFICATION  Patient Name: [name] Birthdate: Jan 20, 1925 Sex: female Admission Date (Current Location): 08/23/2019  [facility] and [place] Number:  Herbalist and Address:  [facility],  [place] [address], [place]      Provider Number: [id number]  Attending Physician Name and Address:  Aline August, MD  Relative Name and Phone Number:       Current Level of Care: Hospital Recommended Level of Care: [place] Prior Approval Number:    Date Approved/Denied:   PASRR Number: [id number]  Discharge Plan: SNF    Current Diagnoses: Patient Active Problem List   Diagnosis Date Noted  . Cancer of renal pelvis, right ([place]) 08/27/2019  . AKI (acute kidney injury) ([place]) 08/24/2019  . Acute renal failure superimposed on stage 3b chronic kidney disease ([place]) 08/23/2019  . Type 2 diabetes mellitus with stage 3 chronic kidney disease ([place]) 08/23/2019  . Hyperlipidemia associated with type 2 diabetes mellitus ([place]) 08/23/2019  . Normocytic anemia 08/23/2019  . Hypothyroidism 08/23/2019  . Hydronephrosis of right kidney 08/23/2019  . Hyponatremia 08/23/2019  . Hip pain 04/22/2019  . Pain in left knee 06/07/2017  . Acute non-infective otitis externa of right ear 10/31/2016  . Impacted cerumen of both ears 12/27/2015  . Marginal perforation of tympanic membrane of right ear 12/27/2015  . Sensorineural hearing loss (SNHL), bilateral 12/27/2015  . S/P lumbar laminectomy 08/19/2015  . Ischemic cardiomyopathy   . Chronic systolic CHF (congestive heart failure) ([place]) 08/18/2011  . Pleural effusion 08/18/2011  . Cellulitis 08/18/2011  . Dyspnea 08/17/2011  . 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 Physical deconditioning 07/19/2011  . Cancer ([place]) 07/08/2011  . Acute anterior wall MI ([place]) 07/07/2011  . Myocardial infarction ([place]) 07/07/2011  .  Hypertension associated with diabetes ([place])   . Osteoarthritis of knee   . CAD (coronary artery disease)   . VSD (ventricular septal defect)     Orientation RESPIRATION BLADDER Height & Weight     Self  O2(see dc summary)   Weight: 147 lb 7.8 oz (66.9 kg) Height:  5' (152.4 cm)  BEHAVIORAL SYMPTOMS/MOOD NEUROLOGICAL BOWEL NUTRITION STATUS        Diet(see dc summary)  AMBULATORY STATUS COMMUNICATION OF NEEDS Skin   Extensive Assist Verbally PU Stage and Appropriate Care   PU Stage 2 Dressing: (sacrum foam dressing change PRN)                   Personal Care Assistance Level of Assistance  Bathing, Feeding, Dressing Bathing Assistance: Maximum assistance Feeding assistance: Limited assistance Dressing Assistance: Maximum assistance     Functional Limitations Info  Sight, Hearing, Speech Sight Info: Adequate Hearing Info: Adequate Speech Info: Adequate    SPECIAL CARE FACTORS FREQUENCY  PT (By licensed PT), OT (By licensed OT)     PT Frequency: 5x week OT Frequency: 5x week            Contractures Contractures Info: Not present    Additional Factors Info  Code Status, Allergies Code Status Info: DNR Allergies Info: Amoxapine and Related, Clozapine           Current Medications (08/29/2019):  This is the current hospital active medication list Current Facility-Administered Medications  Medication Dose Route Frequency Provider Last Rate Last Admin  . acetaminophen (TYLENOL) tablet 650 mg  650 mg Oral Q6H PRN [name], MD  650 mg at 08/27/19 2103   Or  . acetaminophen (TYLENOL) suppository 650 mg  650 mg Rectal Q6H PRN [name], MD      . carvedilol (COREG) tablet 6.25 mg  6.25 mg Oral BID WC [name], MD   6.25 mg at 08/29/19 [id number]  . cephALEXin (KEFLEX) capsule 250 mg  250 mg Oral Q12H Alekh, Kshitiz, MD   250 mg at 08/29/19 1013  . feeding supplement (ENSURE ENLIVE) (ENSURE ENLIVE) liquid 237 mL  237 mL Oral BID BM [name], MD    237 mL at 08/29/19 1013  . heparin injection 5,000 Units  5,000 Units Subcutaneous Q8H [name], MD   5,000 Units at 08/29/19 0536  . levothyroxine (SYNTHROID) tablet 25 mcg  25 mcg Oral QAC breakfast [name], MD   25 mcg at 08/29/19 0536  . lip balm (CARMEX) ointment   Topical PRN [name], MD      . MEDLINE mouth rinse  15 mL Mouth Rinse BID [name], MD   15 mL at 08/29/19 1013  . ondansetron (ZOFRAN) tablet 4 mg  4 mg Oral Q6H PRN [name], MD       Or  . ondansetron [facility]) injection 4 mg  4 mg Intravenous Q6H PRN [name], MD   4 mg at 08/27/19 1630  . polyethylene glycol (MIRALAX / GLYCOLAX) packet 17 g  17 g Oral Daily [name], MD   17 g at 08/29/19 1012  . senna-docusate (Senokot-S) tablet 1 tablet  1 tablet Oral Daily [name], MD   1 tablet at 08/29/19 1013  . simvastatin (ZOCOR) tablet 40 mg  40 mg Oral QPM [name], MD   40 mg at 08/28/19 1801  . sodium bicarbonate 150 mEq in dextrose 5% 1000 mL infusion  150 mEq Intravenous Continuous Alekh, Kshitiz, MD      . sodium chloride flush (NS) 0.9 % injection 3 mL  3 mL Intravenous Once [name], MD      . traMADol Veatrice [place]bon) tablet 50 mg  50 mg Oral Q12H PRN [name], MD   50 mg at 08/28/19 1801     Discharge Medications: Please see discharge summary for a list of discharge medications.  Relevant Imaging Results:  Relevant Lab Results:   Additional Information    Shade Flood, LCSW

## 2019-08-29 NOTE — TOC Initial Note (Signed)
Transition of Care [facility]) - Initial/Assessment Note    Patient Details  Name: [name] MRN: [id number] Date of Birth: 10/19/1924  Transition of Care [facility]) CM/SW Contact:    Shade Flood, LCSW Phone Number: 08/29/2019, 4:29 PM  Clinical Narrative:                  Pt from home with daughter and son in law. Family stating pt too weak to come directly home and would like SNF rehab. Referrals sent out and bed offers discussed with dtr. Family wanting a facility where they can visit at mealtime to encourage pt to eat. [facility] and [facility] are the only options that will allow for that and they will accept pt IF there are no plans for chemo treatment while pt is in rehab. Family updated and dtr states they are leaning towards [facility] but will make decision over the weekend.   Assigned CM will follow up on Monday.  Expected Discharge Plan: Skilled Nursing Facility Barriers to Discharge: Continued Medical Work up, Ship broker   Patient Goals and CMS Choice Patient states their goals for this hospitalization and ongoing recovery are:: rehab CMS Medicare.gov Compare Post Acute Care list provided to:: Patient Represenative (must comment) Choice offered to / list presented to : Adult Children  Expected Discharge Plan and Services Expected Discharge Plan: [place] Acute Care Choice: [place] Living arrangements for the past 2 months: Single Family Home                                      Prior Living Arrangements/Services Living arrangements for the past 2 months: Single Family Home Lives with:: Adult Children Patient language and need for interpreter reviewed:: Yes Do you feel safe going back to the place where you live?: Yes      Need for Family Participation in Patient Care: Yes (Comment) Care giver support system in place?: Yes (comment) Current home services: DME Criminal Activity/Legal Involvement  Pertinent to Current Situation/Hospitalization: No - Comment as needed  Activities of Daily Living Home Assistive Devices/Equipment: Bedside commode/3-in-1, Eyeglasses, Grab bars around toilet, Grab bars in shower, Shower chair without back, Walker (specify type) ADL Screening (condition at time of admission) Patient's cognitive ability adequate to safely complete daily activities?: Yes Is the patient deaf or have difficulty hearing?: Yes(HOH) Does the patient have difficulty seeing, even when wearing glasses/contacts?: Yes Does the patient have difficulty concentrating, remembering, or making decisions?: Yes(forgetful at times recently (per ED report)) Patient able to express need for assistance with ADLs?: Yes Does the patient have difficulty dressing or bathing?: Yes Independently performs ADLs?: No Communication: Independent Dressing (OT): Needs assistance Is this a change from baseline?: Pre-admission baseline Grooming: Needs assistance Is this a change from baseline?: Pre-admission baseline Feeding: Independent Bathing: Needs assistance Is this a change from baseline?: Pre-admission baseline Toileting: Needs assistance Is this a change from baseline?: Pre-admission baseline In/Out Bed: Needs assistance Is this a change from baseline?: Pre-admission baseline Walks in Home: Needs assistance Is this a change from baseline?: Pre-admission baseline Does the patient have difficulty walking or climbing stairs?: Yes Weakness of Legs: Both Weakness of Arms/Hands: Both  Permission Sought/Granted Permission sought to share information with : Facility Art therapist granted to share information with : Yes, Verbal Permission Granted     Permission granted to share info w  AGENCY: local snfs        Emotional Assessment Appearance:: Appears stated age Attitude/Demeanor/Rapport: Lethargic   Orientation: : Oriented to Self, Oriented to Place Alcohol / Substance Use:  Not Applicable Psych Involvement: No (comment)  Admission diagnosis:  Dehydration [E86.0] Hyponatremia [E87.1] Flank pain [R10.9] Generalized weakness [R53.1] AKI (acute kidney injury) ([place]) [N17.9] Other hydronephrosis [N13.39] Acute renal failure superimposed on stage 3b chronic kidney disease ([place]) [N17.9, N18.32] Cancer of renal pelvis, right (HCC) [C65.1] Patient Active Problem List   Diagnosis Date Noted  . Cancer of renal pelvis, right ([place]) 08/27/2019  . AKI (acute kidney injury) ([place]) 08/24/2019  . Acute renal failure superimposed on stage 3b chronic kidney disease ([place]) 08/23/2019  . Type 2 diabetes mellitus with stage 3 chronic kidney disease ([place]) 08/23/2019  . Hyperlipidemia associated with type 2 diabetes mellitus ([place]) 08/23/2019  . Normocytic anemia 08/23/2019  . Hypothyroidism 08/23/2019  . Hydronephrosis of right kidney 08/23/2019  . Hyponatremia 08/23/2019  . Hip pain 04/22/2019  . Pain in left knee 06/07/2017  . Acute non-infective otitis externa of right ear 10/31/2016  . Impacted cerumen of both ears 12/27/2015  . Marginal perforation of tympanic membrane of right ear 12/27/2015  . Sensorineural hearing loss (SNHL), bilateral 12/27/2015  . S/P lumbar laminectomy 08/19/2015  . Ischemic cardiomyopathy   . Chronic systolic CHF (congestive heart failure) ([place]) 08/18/2011  . Pleural effusion 08/18/2011  . Cellulitis 08/18/2011  . Dyspnea 08/17/2011  . 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 Physical deconditioning 07/19/2011  . Cancer ([place]) 07/08/2011  . Acute anterior wall MI ([place]) 07/07/2011  . Myocardial infarction ([place]) 07/07/2011  . Hypertension associated with diabetes ([place])   . Osteoarthritis of knee   . CAD (coronary artery disease)   . VSD (ventricular septal defect)    PCP:  Scheryl Marten, PA Pharmacy:   [facility] [address] [place]  [address] Phone: [phone] Fax: [phone]  [facility], [place] [address] Phone: [phone] Fax: [phone]     Social Determinants of Health (SDOH) Interventions    Readmission Risk Interventions Readmission Risk Prevention Plan 08/29/2019  Transportation Screening Complete  Social Work Consult for [place] Planning/Counseling [place] Screening Complete  Medication Review Press photographer) Complete  Some recent data might be hidden

## 2019-08-29 NOTE — Progress Notes (Signed)
Physical Therapy Treatment Patient Details Name: [name] MRN: [id number] DOB: 09/08/1924 Today's Date: 08/29/2019   Clinical Impression: Patient remains pleasant and agreeable to participate in therapy sessions. Patient is making slow progress with acute PT due to significant weakness. She continues to require +2 mod assist to initiate power up from elevated surfaces and has significant posterior lean in standing required 2 person assist to prevent LOB. Pt performed 3x sit<>stand from recliner and required mod assist to guide hips to sit safely in chair each time. Pt attempted gait training with RW, taking short low steps with minimal forward advancement. She require manual cues/facilitation to improve posture in standing and prevent LOB with steps. Pt remains limited by fatigue and requires intermittent seated rest after ~2 minutes of standing activity with constant external support. At this time pt will require 2 person assist for mobility if she returns home or evaluation for lift equipment to complete transfers safely and will need hospital bed and [facility] services. If family is not able to provide physical assist necessary recommend pt continues rehab at SNF level. She will continue to benefit from skilled PT Interventions to address impairments and progress towards goals. Acute PT will progress as able.     08/29/19 1400  PT Visit Information  Last PT Received On 08/29/19  Assistance Needed +2  History of Present Illness [name] is a 86 y.o. female with medical history significant for CAD s/p CABG, ICM, chronic systolic CHF (EF 99991111 in 2015), CKD stage III, hypertension, hyperlipidemia, hypothyroidism, VSD s/p patch repair, type 2 diabetes, anemia of chronic disease, and history of thymoma s/p resection and radiation who presents to the ED for evaluation of generalized weakness.  Subjective Data  Subjective "I want to get to the chair"  Patient Stated Goal return home  Precautions   Precautions Fall  Precaution Comments Monitor skin and joint integrity closely- frail and prone to skin tears and bruising.  Restrictions  Weight Bearing Restrictions No  Pain Assessment  Pain Assessment No/denies pain  Cognition  Arousal/Alertness Awake/alert  Behavior During Therapy WFL for tasks assessed/performed  Overall Cognitive Status No family/caregiver present to determine baseline cognitive functioning  Bed Mobility  General bed mobility comments pt OOB in recliner at start of session. ended in recliner.  Transfers  Overall transfer level Needs assistance  Equipment used Rolling walker (2 wheeled)  Transfers Sit to/from Stand  Sit to Stand From elevated surface;Mod assist;+2 safety/equipment;+2 physical assistance  General transfer comment pt performed sit<>stand 3x from bedside recliner, 2+ mod assist required to initiate power up and prevent LOB with rising. Pt with significant posterior lean in standing required verbal cues and manual facilitation at hips to improve posture. pt required max assist +2 to reposition in chair. recommend RN staff use Stedy for bed<>chair transfers for safety.  Ambulation/Gait  Ambulation/Gait assistance Mod assist;+2 physical assistance;+2 safety/equipment  Gait Distance (Feet) 3 Feet  Assistive device Rolling walker (2 wheeled)  Gait Pattern/deviations Step-to pattern;Shuffle;Trunk flexed;Narrow base of support;Leaning posteriorly  General Gait Details pt with posterior lean throughout requiring manual facilitation for improve posture. pt taking short shuffling steps with foro hip/knee flexion and poor foot clearance. pt moving one foot forward and then back again before attempting to advance contralateral LE limiting forward advance for ambulation. Pt peformed 2 small bouts of gait training with visual cues for step placement and was unsuccessful at advancing forward functional distance. Pt VERY WEAK and heavily reliant on 2 person external  support  to prevent LOB during standing and attempt at functional gait.   Gait velocity slow  Balance  Overall balance assessment Needs assistance  Sitting-balance support Bilateral upper extremity supported;Feet unsupported  Sitting balance-Leahy Scale Fair  Standing balance support Bilateral upper extremity supported;During functional activity  Standing balance-Leahy Scale Poor  Standing balance comment pt HEAVILY RELIANT ON EXTERNAL SUPPORT to prevent LOB in standing.  PT - End of Session  Equipment Utilized During Treatment Gait belt  Activity Tolerance Patient limited by fatigue  Patient left with call bell/phone within reach;in chair;with chair alarm set  Nurse Communication Mobility status;Need for lift equipment (recommend RN staff use Stedy for bed<>chair transfers)   PT - Assessment/Plan  PT Plan Current plan remains appropriate  PT Visit Diagnosis Unsteadiness on feet (R26.81);Muscle weakness (generalized) (M62.81);Difficulty in walking, not elsewhere classified (R26.2)  PT Frequency (ACUTE ONLY) Min 3X/week  Follow Up Recommendations Home health PT;Supervision/Assistance - 24 hour (HHPT with 24/7 from family, if not pt would benefit from SNF)  PT equipment None recommended by PT (TBD at venue)  AM-PAC PT "6 Clicks" Mobility Outcome Measure (Version 2)  Help needed turning from your back to your side while in a flat bed without using bedrails? 2  Help needed moving from lying on your back to sitting on the side of a flat bed without using bedrails? 2  Help needed moving to and from a bed to a chair (including a wheelchair)? 1  Help needed standing up from a chair using your arms (e.g., wheelchair or bedside chair)? 2  Help needed to walk in hospital room? 1  Help needed climbing 3-5 steps with a railing?  1  6 Click Score 9  Consider Recommendation of Discharge To: CIR/SNF/LTACH  PT Goal Progression  Progress towards PT goals Not progressing toward goals - comment (very  limited and weak)  Acute Rehab PT Goals  PT Goal Formulation With patient  Time For Goal Achievement 09/07/19  Potential to Achieve Goals Fair  PT Time Calculation  PT Start Time (ACUTE ONLY) 1240  PT Stop Time (ACUTE ONLY) 1306  PT Time Calculation (min) (ACUTE ONLY) 26 min  PT General Charges  $$ ACUTE PT VISIT 1 Visit  PT Treatments  $Gait Training 8-22 mins  $Therapeutic Activity 8-22 mins    Verner Mould, DPT Physical Therapist with [facility] [phone]  08/29/2019 7:00 PM

## 2019-08-30 LAB — BASIC METABOLIC PANEL
Anion gap: 10 (ref 5–15)
BUN: 42 mg/dL — ABNORMAL HIGH (ref 8–23)
CO2: 24 mmol/L (ref 22–32)
Calcium: 9 mg/dL (ref 8.9–10.3)
Chloride: 102 mmol/L (ref 98–111)
Creatinine, Ser: 1.77 mg/dL — ABNORMAL HIGH (ref 0.44–1.00)
GFR calc Af Amer: 28 mL/min — ABNORMAL LOW (ref >60)
GFR calc non Af Amer: 25 mL/min — ABNORMAL LOW (ref >60)
Glucose, Bld: 206 mg/dL — ABNORMAL HIGH (ref 70–99)
Potassium: 3.8 mmol/L (ref 3.5–5.1)
Sodium: 136 mmol/L (ref 135–145)

## 2019-08-30 LAB — CBC WITH DIFFERENTIAL/PLATELET
Abs Immature Granulocytes: 0.08 10*3/uL — ABNORMAL HIGH (ref 0.00–0.07)
Basophils Absolute: 0 10*3/uL (ref 0.0–0.1)
Basophils Relative: 0 %
Eosinophils Absolute: 0.3 10*3/uL (ref 0.0–0.5)
Eosinophils Relative: 2 %
HCT: 26.2 % — ABNORMAL LOW (ref 36.0–46.0)
Hemoglobin: 8.5 g/dL — ABNORMAL LOW (ref 12.0–15.0)
Immature Granulocytes: 1 %
Lymphocytes Relative: 4 %
Lymphs Abs: 0.5 10*3/uL — ABNORMAL LOW (ref 0.7–4.0)
MCH: 27 pg (ref 26.0–34.0)
MCHC: 32.4 g/dL (ref 30.0–36.0)
MCV: 83.2 fL (ref 80.0–100.0)
Monocytes Absolute: 1 10*3/uL (ref 0.1–1.0)
Monocytes Relative: 9 %
Neutro Abs: 9.7 10*3/uL — ABNORMAL HIGH (ref 1.7–7.7)
Neutrophils Relative %: 84 %
Platelets: 265 10*3/uL (ref 150–400)
RBC: 3.15 MIL/uL — ABNORMAL LOW (ref 3.87–5.11)
RDW: 15.3 % (ref 11.5–15.5)
WBC: 11.5 10*3/uL — ABNORMAL HIGH (ref 4.0–10.5)
nRBC: 0 % (ref 0.0–0.2)

## 2019-08-30 LAB — MAGNESIUM: Magnesium: 1.8 mg/dL (ref 1.7–2.4)

## 2019-08-30 MED ORDER — MIRTAZAPINE 15 MG PO TABS
7.5000 mg | ORAL_TABLET | Freq: Every day | ORAL | Status: DC
  Administered 2019-08-30 – 2019-08-31 (×2): 7.5 mg via ORAL
  Filled 2019-08-30 (×2): qty 1

## 2019-08-30 NOTE — Progress Notes (Signed)
Patient ID: [name], female   DOB: 1924/11/23, 86 y.o.   MRN: [id number]  PROGRESS NOTE    DIVA MCNEFF  [id number] DOB: 03-12-1925 DOA: 08/23/2019 PCP: Scheryl Marten, PA   Brief Narrative:  86 year old female with history of CAD status post CABG, ICM, chronic systolic CHF with EF of 30 to 35% in 2015, chronic kidney disease stage III, hypertension, hyperlipidemia, hypothyroidism, VSD status post patch repair, diabetes mellitus type 2, anemia of chronic disease, history of thymoma status post resection and radiation presented with generalized weakness and decreased p.o. intake.  She has been following with urology as an outpatient and had plans for cystoscopy/ureteroscopy on 08/27/2019.  Preop labs on 08/21/2019 showed worsening renal function with creatinine of 2.58 compared to baseline of 1.4 along with hyponatremia of 126.  Patient was admitted for the same.  Assessment & Plan:   AKI on chronic kidney disease stage III -Most likely due to right-sided obstruction, moderate right hydronephrosis in addition to prerenal azotemia from dehydration -Renal ultrasound suggested minimal chronic component with normal echogenicity -Creatinine 1.77 today.  Baseline creatinine around 1.3-1.4 -Monitor.  Bicarb level of 24 today.  Currently on bicarb drip.  DC bicarb drip.  Repeat a.m. labs. 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  Status post cystoscopy/ureteroscopy on 08/28/2019 which revealed possible right renal pelvis cancer.  Patient underwent right ureteral stent placement as well at that time.  Not a candidate for surgical intervention as per urology.  Outpatient follow-up with urology  UTI -Urine culture growing Klebsiella pneumonia and E. coli.  Currently on oral Keflex: Complete total of 7-day course of therapy.  Chronic systolic heart  failure CAD status post CABG with VSD patch at the time of bypass -Last EF of 30 to 35% in 2015.  Diuretic and ARB on hold due to volume depletion and acute kidney injury. -Strict input output.  Daily weights. -No longer on aspirin due to fall/bleeding risk -Continue beta-blocker and statin  Hyponatremia -Improved.  Sodium 136 today.  Leukocytosis -Monitor.  Improving  Hypertension -Continue Coreg.  Monitor blood pressure.  ARB/diuretic on hold  Diabetes mellitus type 2 -A1c 6.6.  Metformin on hold.  Hypomagnesemia-improved  Anemia of chronic disease -Probably from renal failure.  Hemoglobin stable.  Monitor  Hypothyroidism -Continue Synthroid  Hyperlipidemia -Continue statin  Generalized weakness Generalized deconditioning -Education officer, museum following for discharge planning to SNF. -Palliative care following.  Patient will benefit from outpatient palliative care follow-up once she gets discharged from SNF.   DVT prophylaxis: Heparin Code Status: DNR Family Communication: Spoke to daughter/Nina on phone on 08/29/2019 Disposition Plan: Currently medically stable for discharge to SNF. Consultants: Urology/palliative care  Procedures: Cystoscopy/ureteroscopy and right-sided stent placement on 08/27/2019 by urology  Antimicrobials:  Rocephin from 08/24/2019-08/27/2019 Keflex from 08/28/2019 onwards  Subjective: Patient seen and examined at bedside.  Awake, poor historian.  Still feels weak and tired.  No overnight fever or vomiting reported. Objective: Vitals:   08/29/19 0554 08/29/19 1436 08/29/19 2202 08/30/19 0618  BP: (!) 137/112 (!) 143/59 (!) 132/47 (!) 149/61  Pulse: 84 73 66 84  Resp: 15 16 15 15   Temp: 98.2 F (36.8 C) 98.1 F (36.7 C) 98 F (36.7 C) 99.2 F (37.3 C)  TempSrc: Oral Oral Oral Oral  SpO2: 92% 97% 93% (!) 85%  Weight: 66.9 kg   66.5 kg  Height:  Intake/Output Summary (Last 24 hours) at 08/30/2019 0806 Last data filed at 08/30/2019  0700 Gross per 24 hour  Intake 1399.53 ml  Output --  Net 1399.53 ml   Filed Weights   08/28/19 0528 08/29/19 0554 08/30/19 0618  Weight: 62.9 kg 66.9 kg 66.5 kg    Examination:  General exam: No acute distress.  Elderly female lying in bed.  Poor historian.  Slightly confused. Respiratory system: Bilateral decreased breath sounds at bases with scattered crackles.  No wheezing  cardiovascular system: S1-S2 heard, rate controlled Gastrointestinal system: Abdomen is nondistended, soft and nontender. Normal bowel sounds heard. Extremities: No cyanosis or clubbing.  Trace lower extremity edema present bilaterally  Data Reviewed: I have personally reviewed following labs and imaging studies  CBC: Recent Labs  Lab 08/24/19 0530 08/25/19 0814 08/27/19 0525 08/28/19 0510 08/30/19 0521  WBC 11.5* 8.8 13.3* 13.6* 11.5*  NEUTROABS  --   --   --  12.2* 9.7*  HGB 8.8* 8.5* 9.1* 8.8* 8.5*  HCT 28.3* 26.8* 29.1* 28.2* 26.2*  MCV 84.2 84.5 83.9 85.5 83.2  PLT 371 341 359 334 [id number]   Basic Metabolic Panel: Recent Labs  Lab 08/26/19 0530 08/27/19 0525 08/28/19 0510 08/29/19 0548 08/30/19 0521  NA 132* 134* 135 136 136  K 4.6 4.6 4.8 4.6 3.8  CL 105 109 108 111 102  CO2 18* 15* 17* 16* 24  GLUCOSE 154* 152* 145* 136* 206*  BUN 26* 26* 33* 45* 42*  CREATININE 1.88* 1.90* 1.92* 1.96* 1.77*  CALCIUM 9.3 9.4 9.4 9.7 9.0  MG  --   --  1.6* 1.8 1.8   GFR: Estimated Creatinine Clearance: 17.3 mL/min (A) (by C-G formula based on SCr of 1.77 mg/dL (H)). Liver Function Tests: Recent Labs  Lab 08/23/19 2158  AST 13*  ALT 12  ALKPHOS 78  BILITOT 0.6  PROT 6.6  ALBUMIN 3.2*   Recent Labs  Lab 08/23/19 2158  LIPASE 16   No results for input(s): AMMONIA in the last 168 hours. Coagulation Profile: No results for input(s): INR, PROTIME in the last 168 hours. Cardiac Enzymes: No results for input(s): CKTOTAL, CKMB, CKMBINDEX, TROPONINI in the last 168 hours. BNP (last 3  results) No results for input(s): PROBNP in the last 8760 hours. HbA1C: No results for input(s): HGBA1C in the last 72 hours. CBG: Recent Labs  Lab 08/25/19 0741 08/25/19 1201 08/27/19 1146 08/28/19 2120 08/29/19 2206  GLUCAP 110* 169* 142* 140* 217*   Lipid Profile: No results for input(s): CHOL, HDL, LDLCALC, TRIG, CHOLHDL, LDLDIRECT in the last 72 hours. Thyroid Function Tests: No results for input(s): TSH, T4TOTAL, FREET4, T3FREE, THYROIDAB in the last 72 hours. Anemia Panel: No results for input(s): VITAMINB12, FOLATE, FERRITIN, TIBC, IRON, RETICCTPCT in the last 72 hours. Sepsis Labs: No results for input(s): PROCALCITON, LATICACIDVEN in the last 168 hours.  Recent Results (from the past 240 hour(s))  SARS CORONAVIRUS 2 (TAT 6-24 HRS) Nasopharyngeal Nasopharyngeal Swab     Status: None   Collection Time: 08/23/19  1:21 PM   Specimen: Nasopharyngeal Swab  Result Value Ref Range Status   SARS Coronavirus 2 NEGATIVE NEGATIVE Final    Comment: (NOTE) SARS-CoV-2 target nucleic acids are NOT DETECTED. The SARS-CoV-2 RNA is generally detectable in upper and lower respiratory specimens during the acute phase of infection. Negative results do not preclude SARS-CoV-2 infection, do not rule out co-infections with other pathogens, and should not be used as the sole basis for treatment or other patient  management decisions. Negative results must be combined with clinical observations, patient history, and epidemiological information. The expected result is Negative. Fact Sheet for Patients: SugarRoll.be Fact Sheet for Healthcare Providers: https://www.woods-mathews.com/ This test is not yet approved or cleared by the [place] FDA and  has been authorized for detection and/or diagnosis of SARS-CoV-2 by FDA under an Emergency Use Authorization (EUA). This EUA will remain  in effect (meaning this test can be used) for the duration of  the COVID-19 declaration under Section 56 4(b)(1) of the Act, 21 U.S.C. section 360bbb-3(b)(1), unless the authorization is terminated or revoked sooner. Performed at [facility], [place] [address] [place], [place] [address]   Urine Culture     Status: Abnormal   Collection Time: 08/24/19 11:16 AM   Specimen: Urine, Clean Catch  Result Value Ref Range Status   Specimen Description   Final    URINE, CLEAN CATCH Performed at [facility], [place] [address] [place], [place] [address]    Special Requests   Final    NONE Performed at [facility], [place] [address], [place], [place] [address]    Culture (A)  Final    >=100,000 COLONIES/mL KLEBSIELLA PNEUMONIAE >=100,000 COLONIES/mL ESCHERICHIA COLI    Report Status 08/27/2019 FINAL  Final   Organism ID, Bacteria KLEBSIELLA PNEUMONIAE (A)  Final   Organism ID, Bacteria ESCHERICHIA COLI (A)  Final      Susceptibility   Escherichia coli - MIC*    AMPICILLIN 8 SENSITIVE Sensitive     CEFAZOLIN <=4 SENSITIVE Sensitive     CEFTRIAXONE <=0.25 SENSITIVE Sensitive     CIPROFLOXACIN <=0.25 SENSITIVE Sensitive     GENTAMICIN >=16 RESISTANT Resistant     IMIPENEM <=0.25 SENSITIVE Sensitive     NITROFURANTOIN <=16 SENSITIVE Sensitive     TRIMETH/SULFA <=20 SENSITIVE Sensitive     AMPICILLIN/SULBACTAM 4 SENSITIVE Sensitive     PIP/TAZO <=4 SENSITIVE Sensitive     * >=100,000 COLONIES/mL ESCHERICHIA COLI   Klebsiella pneumoniae - MIC*    AMPICILLIN >=32 RESISTANT Resistant     CEFAZOLIN <=4 SENSITIVE Sensitive     CEFTRIAXONE <=0.25 SENSITIVE Sensitive     CIPROFLOXACIN <=0.25 SENSITIVE Sensitive     GENTAMICIN <=1 SENSITIVE Sensitive     IMIPENEM <=0.25 SENSITIVE Sensitive     NITROFURANTOIN 64 INTERMEDIATE Intermediate     TRIMETH/SULFA <=20 SENSITIVE Sensitive     AMPICILLIN/SULBACTAM 8 SENSITIVE Sensitive     PIP/TAZO <=4 SENSITIVE Sensitive     * >=100,000 COLONIES/mL KLEBSIELLA PNEUMONIAE   Surgical pcr screen     Status: None   Collection Time: 08/26/19 10:42 PM   Specimen: Nasal Mucosa; Nasal Swab  Result Value Ref Range Status   MRSA, PCR NEGATIVE NEGATIVE Final   Staphylococcus aureus NEGATIVE NEGATIVE Final    Comment: (NOTE) The Xpert SA Assay (FDA approved for NASAL specimens in patients 85 years of age and older), is one component of a comprehensive surveillance program. It is not intended to diagnose infection nor to guide or monitor treatment. Performed at [facility], [place] [address], [place],  [address]          Radiology Studies: No results found.      Scheduled Meds: . carvedilol  6.25 mg Oral BID WC  . cephALEXin  250 mg Oral Q12H  . feeding supplement (ENSURE ENLIVE)  237 mL Oral TID BM  . heparin  5,000 Units Subcutaneous Q8H  . levothyroxine  25  mcg Oral QAC breakfast  . mouth rinse  15 mL Mouth Rinse BID  . polyethylene glycol  17 g Oral Daily  . senna-docusate  1 tablet Oral Daily  . simvastatin  40 mg Oral QPM  . sodium chloride flush  3 mL Intravenous Once   Continuous Infusions: . sodium bicarbonate 150 mEq in dextrose 5% 1000 mL 75 mL/hr at 08/30/19 0700          Aline August, MD [facility] 08/30/2019, 8:06 AM

## 2019-08-30 NOTE — Progress Notes (Signed)
Occupational Therapy Treatment Patient Details Name: [name] MRN: [id number] DOB: Jun 29, 1924 Today's Date: 08/30/2019    History of present illness [name] is a 86 y.o. female with medical history significant for CAD s/p CABG, ICM, chronic systolic CHF (EF [id number] in 2015), CKD stage III, hypertension, hyperlipidemia, hypothyroidism, VSD s/p patch repair, type 2 diabetes, anemia of chronic disease, and history of thymoma s/p resection and radiation who presents to the ED for evaluation of generalized weakness.   OT comments  Pt has demonstrated a decline in functional transfers requiring the use of sara stedy for transfer to recliner. Pt required Moderate assist for supine to sit with cues for hand placement. Pt was given verbal and tacile cues for feet and hand placement to complete sit to stand transfer with RW. Pt attempted twice with sit to stand transfer, but was not able to maintain proper standing balance with feet sliding into forward position and trunk in posterior lean. Pt was not able to follow sequencing cues, which resulting in use of stedy. Pt was able to complete grooming while sitting in recliner and self-feeding with setup.    Follow Up Recommendations  SNF;Supervision/Assistance - 24 hour    Equipment Recommendations  None recommended by OT    Recommendations for Other Services      Precautions / Restrictions Precautions Precautions: Fall Precaution Comments: Monitor skin and joint integrity closely- frail and prone to skin tears and bruising. Restrictions Weight Bearing Restrictions: No       Mobility Bed Mobility   Bed Mobility: Supine to Sit     Supine to sit: Mod assist;HOB elevated        Transfers Overall transfer level: Needs assistance Equipment used: Rolling walker (2 wheeled) Transfers: Sit to/from Stand Sit to Stand: Max assist;+2 physical assistance;+2 safety/equipment;From elevated surface              Balance    Sitting-balance support: Bilateral upper extremity supported;Feet unsupported Sitting balance-Leahy Scale: Fair     Standing balance support: Bilateral upper extremity supported;During functional activity Standing balance-Leahy Scale: Poor                             ADL either performed or assessed with clinical judgement   ADL   Eating/Feeding: Set up;Sitting   Grooming: Set up;Sitting;Wash/dry hands;Wash/dry face;Oral care                               Functional mobility during ADLs: +2 for safety/equipment;+2 for physical assistance;Total assistance       Vision       Perception     Praxis      Cognition Arousal/Alertness: Awake/alert Behavior During Therapy: WFL for tasks assessed/performed Overall Cognitive Status: No family/caregiver present to determine baseline cognitive functioning Area of Impairment: Following commands;Memory;Safety/judgement;Awareness;Problem solving                     Memory: Decreased short-term memory Following Commands: Follows one step commands inconsistently Safety/Judgement: Decreased awareness of safety Awareness: Emergent Problem Solving: Slow processing;Difficulty sequencing          Exercises     Shoulder Instructions       General Comments      Pertinent Vitals/ Pain       Pain Assessment: No/denies pain  Home Living  Prior Functioning/Environment              Frequency           Progress Toward Goals  OT Goals(current goals can now be found in the care plan section)  Progress towards OT goals: Progressing toward goals     Plan Discharge plan remains appropriate    Co-evaluation                 AM-PAC OT "6 Clicks" Daily Activity     Outcome Measure   Help from another person eating meals?: None Help from another person taking care of personal grooming?: A Little Help from another person  toileting, which includes using toliet, bedpan, or urinal?: Total Help from another person bathing (including washing, rinsing, drying)?: A Lot Help from another person to put on and taking off regular upper body clothing?: A Little Help from another person to put on and taking off regular lower body clothing?: A Lot 6 Click Score: 15    End of Session Equipment Utilized During Treatment: Gait belt;Rolling walker  OT Visit Diagnosis: Unsteadiness on feet (R26.81);Other abnormalities of gait and mobility (R26.89);Muscle weakness (generalized) (M62.81)   Activity Tolerance Patient limited by pain   Patient Left in chair;with call bell/phone within reach;with chair alarm set   Nurse Communication Mobility status        Time: [id number] OT Time Calculation (min): 31 min  Charges: OT General Charges $OT Visit: 1 Visit  Kynnedi Zweig OTR/L    Andra Heslin 08/30/2019, 1:15 PM

## 2019-08-30 NOTE — Progress Notes (Signed)
Daily Progress Note   Patient Name: [name]       Date: 08/30/2019 DOB: 1925-03-06  Age: 86 y.o. MRN#: [id number] Attending Physician: Aline August, MD Primary Care Physician: Scheryl Marten, [place] Admit Date: 08/23/2019  Reason for Consultation/Follow-up: Establishing goals of care  Subjective: I met today with [name].  She was sitting in bedside chair and denied complaints today.  States pain remains better controlled since having stent placed.    Her daughter was at the bedside today.  We also called her son, [name], and participate in conversation.  We discussed appetite and she reports that it is still "down."  We reviewed if she is having any other problems and she also denies having trouble sleeping at night.  Discussed risk versus benefit of trial of medication to see if it benefits her in appetite and sleep.  She is very open to this and her family agrees that they would want to see if something else is helpful to help stimulate her appetite as they are very concerned about her nutritional status moving forward.  Length of Stay: 6  Current Medications: Scheduled Meds:  . carvedilol  6.25 mg Oral BID WC  . cephALEXin  250 mg Oral Q12H  . feeding supplement (ENSURE ENLIVE)  237 mL Oral TID BM  . heparin  5,000 Units Subcutaneous Q8H  . levothyroxine  25 mcg Oral QAC breakfast  . mouth rinse  15 mL Mouth Rinse BID  . mirtazapine  7.5 mg Oral QHS  . polyethylene glycol  17 g Oral Daily  . senna-docusate  1 tablet Oral Daily  . simvastatin  40 mg Oral QPM  . sodium chloride flush  3 mL Intravenous Once    Continuous Infusions:   PRN Meds: acetaminophen **OR** acetaminophen, lip balm, ondansetron **OR** ondansetron (ZOFRAN) IV, traMADol  Physical Exam           General: Alert, awake, in no acute distress.  Tired appearing.  Sitting in bedside chair Heart: Regular rate and rhythm. No murmur appreciated. Lungs: Good air movement, clear Abdomen: Soft, nontender, nondistended, positive bowel sounds.  Ext: No significant edema Skin: Warm and dry  Vital Signs: BP (!) 157/65 (BP Location: Left Arm)   Pulse 87   Temp 97.6 F (36.4 C) (Oral)   Resp 14  Ht 5' (1.524 m)   Wt 66.5 kg   SpO2 97%   BMI 28.63 kg/m  SpO2: SpO2: 97 % O2 Device: O2 Device: Nasal Cannula O2 Flow Rate: O2 Flow Rate (L/min): 3 L/min  Intake/output summary:   Intake/Output Summary (Last 24 hours) at 08/30/2019 1923 Last data filed at 08/30/2019 1427 Gross per 24 hour  Intake 1529 ml  Output 400 ml  Net 1129 ml   LBM: Last BM Date: 08/29/19 Baseline Weight: Weight: 58.5 kg Most recent weight: Weight: 66.5 kg       Palliative Assessment/Data:    Flowsheet Rows     Most Recent Value  Intake Tab  Referral Department  Hospitalist  Unit at Time of Referral  Oncology Unit  Palliative Care Primary Diagnosis  Cancer  Date Notified  08/25/19  Palliative Care Type  New Palliative care  Reason for referral  Clarify Goals of Care  Date of Admission  08/23/19  Date first seen by Palliative Care  08/26/19  # of days Palliative referral response time  1 Day(s)  # of days IP prior to Palliative referral  2  Clinical Assessment  Palliative Performance Scale Score  60%  Psychosocial & Spiritual Assessment  Palliative Care Outcomes  Patient/Family meeting held?  Yes  Who was at the meeting?  Patient, daughter, son      Patient Active Problem List   Diagnosis Date Noted  . Cancer of renal pelvis, right ([place]) 08/27/2019  . AKI (acute kidney injury) ([place]) 08/24/2019  . Acute renal failure superimposed on stage 3b chronic kidney disease ([place]) 08/23/2019  . Type 2 diabetes mellitus with stage 3 chronic kidney disease ([place]) 08/23/2019  . Hyperlipidemia associated with  type 2 diabetes mellitus ([place]) 08/23/2019  . Normocytic anemia 08/23/2019  . Hypothyroidism 08/23/2019  . Hydronephrosis of right kidney 08/23/2019  . Hyponatremia 08/23/2019  . Hip pain 04/22/2019  . Pain in left knee 06/07/2017  . Acute non-infective otitis externa of right ear 10/31/2016  . Impacted cerumen of both ears 12/27/2015  . Marginal perforation of tympanic membrane of right ear 12/27/2015  . Sensorineural hearing loss (SNHL), bilateral 12/27/2015  . S/P lumbar laminectomy 08/19/2015  . Ischemic cardiomyopathy   . Chronic systolic CHF (congestive heart failure) ([place]) 08/18/2011  . Pleural effusion 08/18/2011  . Cellulitis 08/18/2011  . Dyspnea 08/17/2011  . 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 Physical deconditioning 07/19/2011  . Cancer ([place]) 07/08/2011  . Acute anterior wall MI ([place]) 07/07/2011  . Myocardial infarction ([place]) 07/07/2011  . Hypertension associated with diabetes ([place])   . Osteoarthritis of knee   . CAD (coronary artery disease)   . VSD (ventricular septal defect)     Palliative Care Assessment & Plan   Patient Profile: 86 yo female with new renal papillary carcinoma  Recommendations/Plan: -Ms. [name] continues to report poor appetite.  She also states that she is not sleeping at night.  We will therefore plan for trial of Remeron to see if this benefits her and helping sleep and also with her appetite.  Plan to begin Remeron 7.5 mg nightly.  Plan is for follow-up with medical oncology to discuss potential disease modifying therapy for newly diagnosed urothelial cancer.  Per telephone note in chart, appointment set for Dr. Alen Blew on 4/2.  Family had been wanting to work to transition home, however, her daughter cannot care for her if she is not able to be independently ambulatory.  TOC working with family for options  for SNF for short term rehab.  Recommend palliative care to follow with patient at discharge.  As she is a  [facility] patient she is candidate for care connections program through [facility] [place].  I called and discussed with liaison and they will follow Ms. [name] once she transitions home (their program does not follow patients at [facility]).  Code Status:    Code Status Orders  (From admission, onward)         Start     Ordered   08/23/19 2338  Do not attempt resuscitation (DNR)  Continuous    Question Answer Comment  In the event of cardiac or respiratory ARREST Do not call a "code blue"   In the event of cardiac or respiratory ARREST Do not perform Intubation, CPR, defibrillation or ACLS   In the event of cardiac or respiratory ARREST Use medication by any route, position, wound care, and other measures to relive pain and suffering. May use oxygen, suction and manual treatment of airway obstruction as needed for comfort.      08/23/19 2338        Code Status History    Date Active Date Inactive Code Status Order ID Comments User Context   04/22/2019 2045 04/24/2019 1721 DNR [id number]  Truddie Hidden, MD Inpatient   07/17/2011 1340 07/18/2011 2016 Full Code [id number]  Grace Isaac, MD Inpatient   Advance Care Planning Activity    Advance Directive Documentation     Most Recent Value  Type of Advance Directive  Healthcare Power of Attorney, Living will  Pre-existing out of facility DNR order (yellow form or pink MOST form)  --  "MOST" Form in Place?  --       Prognosis: Guarded  Discharge Planning:  Home with Home Health and recommendation for palliative care follow-up through care connections program  Care plan was discussed with patient, son, and daughter  Thank you for allowing the Palliative Medicine Team to assist in the care of this patient.   Total Time 30 Prolonged Time Billed No      Greater than 50%  of this time was spent counseling and coordinating care related to the above assessment and plan.  Micheline Rough, MD  Please contact Palliative Medicine Team  phone at [phone] for questions and concerns.

## 2019-08-31 LAB — CBC WITH DIFFERENTIAL/PLATELET
Abs Immature Granulocytes: 0.11 10*3/uL — ABNORMAL HIGH (ref 0.00–0.07)
Basophils Absolute: 0.1 10*3/uL (ref 0.0–0.1)
Basophils Relative: 0 %
Eosinophils Absolute: 0.3 10*3/uL (ref 0.0–0.5)
Eosinophils Relative: 2 %
HCT: 26.8 % — ABNORMAL LOW (ref 36.0–46.0)
Hemoglobin: 8.3 g/dL — ABNORMAL LOW (ref 12.0–15.0)
Immature Granulocytes: 1 %
Lymphocytes Relative: 4 %
Lymphs Abs: 0.5 10*3/uL — ABNORMAL LOW (ref 0.7–4.0)
MCH: 26.3 pg (ref 26.0–34.0)
MCHC: 31 g/dL (ref 30.0–36.0)
MCV: 85.1 fL (ref 80.0–100.0)
Monocytes Absolute: 1.3 10*3/uL — ABNORMAL HIGH (ref 0.1–1.0)
Monocytes Relative: 10 %
Neutro Abs: 10.9 10*3/uL — ABNORMAL HIGH (ref 1.7–7.7)
Neutrophils Relative %: 83 %
Platelets: 271 10*3/uL (ref 150–400)
RBC: 3.15 MIL/uL — ABNORMAL LOW (ref 3.87–5.11)
RDW: 15.1 % (ref 11.5–15.5)
WBC: 13.2 10*3/uL — ABNORMAL HIGH (ref 4.0–10.5)
nRBC: 0 % (ref 0.0–0.2)

## 2019-08-31 LAB — BASIC METABOLIC PANEL
Anion gap: 10 (ref 5–15)
BUN: 36 mg/dL — ABNORMAL HIGH (ref 8–23)
CO2: 25 mmol/L (ref 22–32)
Calcium: 9.2 mg/dL (ref 8.9–10.3)
Chloride: 103 mmol/L (ref 98–111)
Creatinine, Ser: 1.74 mg/dL — ABNORMAL HIGH (ref 0.44–1.00)
GFR calc Af Amer: 29 mL/min — ABNORMAL LOW (ref >60)
GFR calc non Af Amer: 25 mL/min — ABNORMAL LOW (ref >60)
Glucose, Bld: 136 mg/dL — ABNORMAL HIGH (ref 70–99)
Potassium: 4 mmol/L (ref 3.5–5.1)
Sodium: 138 mmol/L (ref 135–145)

## 2019-08-31 LAB — MAGNESIUM: Magnesium: 1.7 mg/dL (ref 1.7–2.4)

## 2019-08-31 NOTE — Anesthesia Postprocedure Evaluation (Signed)
Anesthesia Post Note  Patient: [name]  Procedure(s) Performed: CYSTOSCOPY, BILATERAL RETROGRADE, RIGHT URETEROSCOPY, RIGHT RENAL PELVIS BIOPSY, RIGHT URETERAL STENT PLACEMENT (Bilateral )     Patient location during evaluation: PACU Anesthesia Type: General Level of consciousness: sedated Pain management: pain level controlled Vital Signs Assessment: post-procedure vital signs reviewed and stable Respiratory status: spontaneous breathing Cardiovascular status: stable Postop Assessment: no apparent nausea or vomiting Anesthetic complications: no    Last Vitals:  Vitals:   08/30/19 2107 08/31/19 0603  BP: (!) 139/50 (!) 156/62  Pulse: 72 81  Resp: 18 18  Temp: 36.9 C 36.8 C  SpO2: 96% 97%    Last Pain:  Vitals:   08/31/19 0603  TempSrc: Oral  PainSc:    Pain Goal: Patients Stated Pain Goal: 0 (08/27/19 2115)                 Huston Foley

## 2019-08-31 NOTE — Progress Notes (Addendum)
Patient ID: [name], female   DOB: 25-Feb-1925, 86 y.o.   MRN: [id number]  PROGRESS NOTE    [name]  [id number] DOB: Mar 05, 1925 DOA: 08/23/2019 PCP: Scheryl Marten, PA   Brief Narrative:  86 year old female with history of CAD status post CABG, ICM, chronic systolic CHF with EF of 30 to 35% in 2015, chronic kidney disease stage III, hypertension, hyperlipidemia, hypothyroidism, VSD status post patch repair, diabetes mellitus type 2, anemia of chronic disease, history of thymoma status post resection and radiation presented with generalized weakness and decreased p.o. intake.  She has been following with urology as an outpatient and had plans for cystoscopy/ureteroscopy on 08/27/2019.  Preop labs on 08/21/2019 showed worsening renal function with creatinine of 2.58 compared to baseline of 1.4 along with hyponatremia of 126.  Patient was admitted for the same.  She had cystoscopy and ureteroscopy which showed probable right renal pelvis cancer and patient underwent right ureteral stent placement by urology.  Urology recommended that patient is not a surgical candidate and recommended outpatient follow-up with urology.  Palliative care also was consulted.  Currently awaiting SNF placement.  Assessment & Plan:   AKI on chronic kidney disease stage III -Most likely due to right-sided obstruction, moderate right hydronephrosis in addition to prerenal azotemia from dehydration -Renal ultrasound suggested minimal chronic component with normal echogenicity -Creatinine 1.74 today.  Baseline creatinine around 1.3-1.4 -Monitor.  Treated briefly with bicarb drip which has already been discontinued on 08/30/2019.   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  Status post cystoscopy/ureteroscopy on 08/28/2019 which revealed possible right renal pelvis  cancer.  Patient underwent right ureteral stent placement as well at that time.  Not a candidate for surgical intervention as per urology.  Outpatient follow-up with urology  UTI -Urine culture growing Klebsiella pneumonia and E. coli.  Has completed 7-day course of antibiotic therapy.  Chronic systolic heart failure CAD status post CABG with VSD patch at the time of bypass -Last EF of 30 to 35% in 2015.  Diuretic and ARB on hold due to volume depletion and acute kidney injury. -Strict input output.  Daily weights. -No longer on aspirin due to fall/bleeding risk -Continue beta-blocker and statin  Hyponatremia -Resolved  Leukocytosis -Has intermittent leukocytosis.  Monitor  Hypertension -Continue Coreg.  Monitor blood pressure.  ARB/diuretic on hold  Diabetes mellitus type 2 -A1c 6.6.  Metformin on hold.  Hypomagnesemia-improved  Anemia of chronic disease -Probably from renal failure.  Hemoglobin stable.  Monitor  Hypothyroidism -Continue Synthroid  Hyperlipidemia -Continue statin  Generalized weakness Generalized deconditioning -Education officer, museum following for discharge planning to SNF. -Palliative care following.  Patient will benefit from outpatient palliative care follow-up once she gets discharged from SNF.   DVT prophylaxis: Heparin Code Status: DNR Family Communication: Spoke to daughter/Nina on phone on 08/29/2019 Disposition Plan: Currently medically stable for discharge to SNF.  SNF once bed is available. Consultants: Urology/palliative care  Procedures: Cystoscopy/ureteroscopy and right-sided stent placement on 08/27/2019 by urology  Antimicrobials:  Rocephin from 08/24/2019-08/27/2019 Keflex from 08/28/2019-08/30/2019  Subjective: Patient seen and examined at bedside.  Still feels very weak and tired.  Oral intake is very poor.  No overnight fever, vomiting reported by nursing staff.  Poor historian. Objective: Vitals:   08/30/19 1400 08/30/19 2107 08/31/19  0603 08/31/19 0657  BP: (!) 157/65 (!) 139/50 (!) 156/62   Pulse: 87 72 81   Resp:  14 18 18    Temp: 97.6 F (36.4 C) 98.4 F (36.9 C) 98.2 F (36.8 C)   TempSrc: Oral Oral Oral   SpO2: 97% 96% 97%   Weight:    64.6 kg  Height:        Intake/Output Summary (Last 24 hours) at 08/31/2019 0814 Last data filed at 08/31/2019 [id number] Gross per 24 hour  Intake 340 ml  Output 1050 ml  Net -710 ml   Filed Weights   08/29/19 0554 08/30/19 0618 08/31/19 0657  Weight: 66.9 kg 66.5 kg 64.6 kg    Examination:  General exam: No distress.  Elderly female lying in bed.  Extremely poor historian.  Still slightly confused.   Respiratory system: Bilateral decreased breath sounds at bases with some crackles.   Cardiovascular system: Rate controlled, S1-S2 heard Gastrointestinal system: Abdomen is nondistended, soft and nontender.  Bowel sounds are heard  extremities: No cyanosis or clubbing.  Trace lower extremity edema present bilaterally  Data Reviewed: I have personally reviewed following labs and imaging studies  CBC: Recent Labs  Lab 08/25/19 0814 08/27/19 0525 08/28/19 0510 08/30/19 0521 08/31/19 0531  WBC 8.8 13.3* 13.6* 11.5* 13.2*  NEUTROABS  --   --  12.2* 9.7* 10.9*  HGB 8.5* 9.1* 8.8* 8.5* 8.3*  HCT 26.8* 29.1* 28.2* 26.2* 26.8*  MCV 84.5 83.9 85.5 83.2 85.1  PLT 341 359 334 265 [id number]   Basic Metabolic Panel: Recent Labs  Lab 08/27/19 0525 08/28/19 0510 08/29/19 0548 08/30/19 0521 08/31/19 0531  NA 134* 135 136 136 138  K 4.6 4.8 4.6 3.8 4.0  CL 109 108 111 102 103  CO2 15* 17* 16* 24 25  GLUCOSE 152* 145* 136* 206* 136*  BUN 26* 33* 45* 42* 36*  CREATININE 1.90* 1.92* 1.96* 1.77* 1.74*  CALCIUM 9.4 9.4 9.7 9.0 9.2  MG  --  1.6* 1.8 1.8 1.7   GFR: Estimated Creatinine Clearance: 17.3 mL/min (A) (by C-G formula based on SCr of 1.74 mg/dL (H)). Liver Function Tests: No results for input(s): AST, ALT, ALKPHOS, BILITOT, PROT, ALBUMIN in the last 168 hours. No  results for input(s): LIPASE, AMYLASE in the last 168 hours. No results for input(s): AMMONIA in the last 168 hours. Coagulation Profile: No results for input(s): INR, PROTIME in the last 168 hours. Cardiac Enzymes: No results for input(s): CKTOTAL, CKMB, CKMBINDEX, TROPONINI in the last 168 hours. BNP (last 3 results) No results for input(s): PROBNP in the last 8760 hours. HbA1C: No results for input(s): HGBA1C in the last 72 hours. CBG: Recent Labs  Lab 08/25/19 0741 08/25/19 1201 08/27/19 1146 08/28/19 2120 08/29/19 2206  GLUCAP 110* 169* 142* 140* 217*   Lipid Profile: No results for input(s): CHOL, HDL, LDLCALC, TRIG, CHOLHDL, LDLDIRECT in the last 72 hours. Thyroid Function Tests: No results for input(s): TSH, T4TOTAL, FREET4, T3FREE, THYROIDAB in the last 72 hours. Anemia Panel: No results for input(s): VITAMINB12, FOLATE, FERRITIN, TIBC, IRON, RETICCTPCT in the last 72 hours. Sepsis Labs: No results for input(s): PROCALCITON, LATICACIDVEN in the last 168 hours.  Recent Results (from the past 240 hour(s))  SARS CORONAVIRUS 2 (TAT 6-24 HRS) Nasopharyngeal Nasopharyngeal Swab     Status: None   Collection Time: 08/23/19  1:21 PM   Specimen: Nasopharyngeal Swab  Result Value Ref Range Status   SARS Coronavirus 2 NEGATIVE NEGATIVE Final    Comment: (NOTE) SARS-CoV-2 target nucleic acids are NOT DETECTED. The SARS-CoV-2 RNA is generally detectable in upper and lower respiratory specimens  during the acute phase of infection. Negative results do not preclude SARS-CoV-2 infection, do not rule out co-infections with other pathogens, and should not be used as the sole basis for treatment or other patient management decisions. Negative results must be combined with clinical observations, patient history, and epidemiological information. The expected result is Negative. Fact Sheet for Patients: SugarRoll.be Fact Sheet for Healthcare  Providers: https://www.woods-mathews.com/ This test is not yet approved or cleared by the [place] FDA and  has been authorized for detection and/or diagnosis of SARS-CoV-2 by FDA under an Emergency Use Authorization (EUA). This EUA will remain  in effect (meaning this test can be used) for the duration of the COVID-19 declaration under Section 56 4(b)(1) of the Act, 21 U.S.C. section 360bbb-3(b)(1), unless the authorization is terminated or revoked sooner. Performed at [facility], [place] [address] [place], [place] [address]   Urine Culture     Status: Abnormal   Collection Time: 08/24/19 11:16 AM   Specimen: Urine, Clean Catch  Result Value Ref Range Status   Specimen Description   Final    URINE, CLEAN CATCH Performed at [facility], [place] [address] [place], [place] [address]    Special Requests   Final    NONE Performed at [facility], [place] [address] [place], [place] [address]    Culture (A)  Final    >=100,000 COLONIES/mL KLEBSIELLA PNEUMONIAE >=100,000 COLONIES/mL ESCHERICHIA COLI    Report Status 08/27/2019 FINAL  Final   Organism ID, Bacteria KLEBSIELLA PNEUMONIAE (A)  Final   Organism ID, Bacteria ESCHERICHIA COLI (A)  Final      Susceptibility   Escherichia coli - MIC*    AMPICILLIN 8 SENSITIVE Sensitive     CEFAZOLIN <=4 SENSITIVE Sensitive     CEFTRIAXONE <=0.25 SENSITIVE Sensitive     CIPROFLOXACIN <=0.25 SENSITIVE Sensitive     GENTAMICIN >=16 RESISTANT Resistant     IMIPENEM <=0.25 SENSITIVE Sensitive     NITROFURANTOIN <=16 SENSITIVE Sensitive     TRIMETH/SULFA <=20 SENSITIVE Sensitive     AMPICILLIN/SULBACTAM 4 SENSITIVE Sensitive     PIP/TAZO <=4 SENSITIVE Sensitive     * >=100,000 COLONIES/mL ESCHERICHIA COLI   Klebsiella pneumoniae - MIC*    AMPICILLIN >=32 RESISTANT Resistant     CEFAZOLIN <=4 SENSITIVE Sensitive     CEFTRIAXONE <=0.25 SENSITIVE Sensitive     CIPROFLOXACIN <=0.25  SENSITIVE Sensitive     GENTAMICIN <=1 SENSITIVE Sensitive     IMIPENEM <=0.25 SENSITIVE Sensitive     NITROFURANTOIN 64 INTERMEDIATE Intermediate     TRIMETH/SULFA <=20 SENSITIVE Sensitive     AMPICILLIN/SULBACTAM 8 SENSITIVE Sensitive     PIP/TAZO <=4 SENSITIVE Sensitive     * >=100,000 COLONIES/mL KLEBSIELLA PNEUMONIAE  Surgical pcr screen     Status: None   Collection Time: 08/26/19 10:42 PM   Specimen: Nasal Mucosa; Nasal Swab  Result Value Ref Range Status   MRSA, PCR NEGATIVE NEGATIVE Final   Staphylococcus aureus NEGATIVE NEGATIVE Final    Comment: (NOTE) The Xpert SA Assay (FDA approved for NASAL specimens in patients 65 years of age and older), is one component of a comprehensive surveillance program. It is not intended to diagnose infection nor to guide or monitor treatment. Performed at [facility], [place] [address] [place], [place] [address]          Radiology Studies: No results found.      Scheduled Meds: . carvedilol  6.25 mg Oral  BID WC  . feeding supplement (ENSURE ENLIVE)  237 mL Oral TID BM  . heparin  5,000 Units Subcutaneous Q8H  . levothyroxine  25 mcg Oral QAC breakfast  . mouth rinse  15 mL Mouth Rinse BID  . mirtazapine  7.5 mg Oral QHS  . polyethylene glycol  17 g Oral Daily  . senna-docusate  1 tablet Oral Daily  . simvastatin  40 mg Oral QPM  . sodium chloride flush  3 mL Intravenous Once   Continuous Infusions:         Aline August, MD [facility] 08/31/2019, 8:14 AM

## 2019-08-31 NOTE — Progress Notes (Signed)
Daily Progress Note   Patient Name: [name]       Date: 08/31/2019 DOB: 07/12/1924  Age: 86 y.o. MRN#: [id number] Attending Physician: Aline August, MD Primary Care Physician: Scheryl Marten, [place] Admit Date: 08/23/2019  Reason for Consultation/Follow-up: Establishing goals of care  Subjective: I met today with Ms. Sarr.  She was sitting in bed and denied complaints today.  States pain remains better controlled since having stent placed.    Her son was at the bedside today.  We discussed appetite and hope that this will improve.  Antony Haste has brought some of her favorites (such as fresh pineapple) to see of she can eat more.  Length of Stay: 7  Current Medications: Scheduled Meds:   carvedilol  6.25 mg Oral BID WC   feeding supplement (ENSURE ENLIVE)  237 mL Oral TID BM   heparin  5,000 Units Subcutaneous Q8H   levothyroxine  25 mcg Oral QAC breakfast   mouth rinse  15 mL Mouth Rinse BID   mirtazapine  7.5 mg Oral QHS   polyethylene glycol  17 g Oral Daily   senna-docusate  1 tablet Oral Daily   simvastatin  40 mg Oral QPM   sodium chloride flush  3 mL Intravenous Once    Continuous Infusions:   PRN Meds: acetaminophen **OR** acetaminophen, lip balm, ondansetron **OR** ondansetron (ZOFRAN) IV, traMADol  Physical Exam         General: Alert, awake, in no acute distress.  Tired appearing.  Heart: Regular rate and rhythm. No murmur appreciated. Lungs: Good air movement, clear Abdomen: Soft, nontender, nondistended, positive bowel sounds.  Ext: No significant edema Skin: Warm and dry  Vital Signs: BP (!) 138/50 (BP Location: Right Arm)    Pulse 71    Temp 97.7 F (36.5 C) (Oral)    Resp 18    Ht 5' (1.524 m)    Wt 64.6 kg    SpO2 91%    BMI 27.81 kg/m   SpO2: SpO2: 91 % O2 Device: O2 Device: Room Air O2 Flow Rate: O2 Flow Rate (L/min): 3 L/min  Intake/output summary:   Intake/Output Summary (Last 24 hours) at 08/31/2019 2235 Last data filed at 08/31/2019 1833 Gross per 24 hour  Intake 270 ml  Output 1050 ml  Net -780 ml  LBM: Last BM Date: 08/29/19 Baseline Weight: Weight: 58.5 kg Most recent weight: Weight: 64.6 kg       Palliative Assessment/Data:    Flowsheet Rows     Most Recent Value  Intake Tab  Referral Department  Hospitalist  Unit at Time of Referral  Oncology Unit  Palliative Care Primary Diagnosis  Cancer  Date Notified  08/25/19  Palliative Care Type  New Palliative care  Reason for referral  Clarify Goals of Care  Date of Admission  08/23/19  Date first seen by Palliative Care  08/26/19  # of days Palliative referral response time  1 Day(s)  # of days IP prior to Palliative referral  2  Clinical Assessment  Palliative Performance Scale Score  60%  Psychosocial & Spiritual Assessment  Palliative Care Outcomes  Patient/Family meeting held?  Yes  Who was at the meeting?  Patient, daughter, son      Patient Active Problem List   Diagnosis Date Noted   Cancer of renal pelvis, right ([place]) 08/27/2019   AKI (acute kidney injury) ([place]) 08/24/2019   Acute renal failure superimposed on stage 3b chronic kidney disease ([place]) 08/23/2019   Type 2 diabetes mellitus with stage 3 chronic kidney disease ([place]) 08/23/2019   Hyperlipidemia associated with type 2 diabetes mellitus ([place]) 08/23/2019   Normocytic anemia 08/23/2019   Hypothyroidism 08/23/2019   Hydronephrosis of right kidney 08/23/2019   Hyponatremia 08/23/2019   Hip pain 04/22/2019   Pain in left knee 06/07/2017   Acute non-infective otitis externa of right ear 10/31/2016   Impacted cerumen of both ears 12/27/2015   Marginal perforation of tympanic membrane of right ear 12/27/2015   Sensorineural hearing loss (SNHL), bilateral  12/27/2015   S/P lumbar laminectomy 08/19/2015   Ischemic cardiomyopathy    Chronic systolic CHF (congestive heart failure) ([place]) 08/18/2011   Pleural effusion 08/18/2011   Cellulitis 08/18/2011   Dyspnea 08/17/2011   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  Discussed plan to continue to encourage intake and family hope that she will be able to rehab to regain functional status. -Ms. Helling continues to report poor appetite.  She also states that she is not sleeping at night.  Continue trial of Remeron to see if this benefits her and helping sleep and also with her appetite.   Plan is for follow-up with medical oncology to discuss potential disease modifying therapy for newly diagnosed urothelial cancer.  Per telephone note in chart, appointment set for Dr. Alen Blew on 4/2.  Family had been wanting to work to transition home, however, her daughter cannot care for her if she is not able to be independently ambulatory.  TOC working with family for options for SNF for short term rehab.  Recommend palliative care to follow with patient at discharge.  As she is a [facility] patient she is candidate for care connections program through [facility] [place].  I called and discussed with liaison and they will follow Ms. Barfuss once she transitions home (their program does  not follow patients at [facility]).  Code Status:    Code Status Orders  (From admission, onward)         Start     Ordered   08/23/19 2338   Do not attempt resuscitation (DNR)  Continuous    Question Answer Comment  In the event of cardiac or respiratory ARREST Do not call a code blue   In the event of cardiac or respiratory ARREST Do not perform Intubation, CPR, defibrillation or ACLS   In the event of cardiac or respiratory ARREST Use medication by any route, position, wound care, and other measures to relive pain and suffering. May use oxygen, suction and manual treatment of airway obstruction as needed for comfort.      08/23/19 2338        Code Status History    Date Active Date Inactive Code Status Order ID Comments User Context   04/22/2019 2045 04/24/2019 1721 DNR [id number]  Truddie Hidden, MD Inpatient   07/17/2011 1340 07/18/2011 2016 Full Code [id number]  Grace Isaac, MD Inpatient   Advance Care Planning Activity    Advance Directive Documentation     Most Recent Value  Type of Advance Directive  Healthcare Power of Attorney, Living will  Pre-existing out of facility DNR order (yellow form or pink MOST form)  --  "MOST" Form in Place?  --       Prognosis: Guarded  Discharge Planning:  [facility] for rehab with Palliative care service follow-up on return home  Care plan was discussed with patient, son, and daughter  Thank you for allowing the Palliative Medicine Team to assist in the care of this patient.   Total Time 25 Prolonged Time Billed No      Greater than 50%  of this time was spent counseling and coordinating care related to the above assessment and plan.  Micheline Rough, MD  Please contact Palliative Medicine Team phone at [phone] for questions and concerns.

## 2019-08-31 NOTE — Plan of Care (Signed)
  Problem: Activity: Goal: Risk for activity intolerance will decrease Outcome: Not Progressing   Problem: Nutrition: Goal: Adequate nutrition will be maintained Outcome: Not Progressing   Problem: Safety: Goal: Ability to remain free from injury will improve Outcome: Progressing   Problem: Elimination: Goal: Will not experience complications related to urinary retention Outcome: Progressing

## 2019-09-01 LAB — SARS CORONAVIRUS 2 (TAT 6-24 HRS): SARS Coronavirus 2: NEGATIVE

## 2019-09-01 MED ORDER — MIRTAZAPINE 15 MG PO TABS
15.0000 mg | ORAL_TABLET | Freq: Every day | ORAL | Status: DC
  Administered 2019-09-01 – 2019-09-05 (×4): 15 mg via ORAL
  Filled 2019-09-01 (×5): qty 1

## 2019-09-01 MED ORDER — MAGNESIUM SULFATE IN D5W 1-5 GM/100ML-% IV SOLN
1.0000 g | Freq: Once | INTRAVENOUS | Status: AC
  Administered 2019-09-01: 1 g via INTRAVENOUS
  Filled 2019-09-01: qty 100

## 2019-09-01 NOTE — TOC Progression Note (Signed)
Transition of Care [facility]) - Progression Note    Patient Details  Name: LUCKY ALVERSON MRN: [id number] Date of Birth: 17-Feb-1925  [facility] CM/SW Contact  Maile Linford, Marjie Skiff, RN Phone Number: 09/01/2019, 2:21 PM  Clinical Narrative:    This CM met with both son and daughter at bedside today. [facility] was chosen for SNF. [facility] can not start auth until we have an idea of what biologic medication may be given for her cancer. Pt has an appointment with Dr Alen Blew on Friday 4/2. This CM attempted to call cancer center to ask above question. This CM was informed that MD was off this afternoon. Attending to speak to oncology about placing recommendations in the pt chart so we can move forward with SNF placement. TOC will continue to follow.   Expected Discharge Plan: [place] Barriers to Discharge: Continued Medical Work up, Ship broker  Expected Discharge Plan and Services Expected Discharge Plan: [place] Choice: [place] arrangements for the past 2 months: Single Family Home                                       Social Determinants of Health (SDOH) Interventions    Readmission Risk Interventions Readmission Risk Prevention Plan 08/29/2019  Transportation Screening Complete  Social Work Consult for [place] Planning/Counseling [place] Screening Complete  Medication Review Press photographer) Complete  Some recent data might be hidden

## 2019-09-01 NOTE — Care Management Important Message (Signed)
Important Message  Patient Details IM Letter given to Marney Doctor RN Case Manager to present to the Patient Name: [name] MRN: [id number] Date of Birth: [birth year]   Medicare Important Message Given:  Yes     Kerin Salen 09/01/2019, 10:07 AM

## 2019-09-01 NOTE — Progress Notes (Signed)
Daily Progress Note   Patient Name: [name]       Date: 09/01/2019 DOB: May 19, 1925  Age: 86 y.o. MRN#: [id number] Attending Physician: Kayleen Memos, DO Primary Care Physician: Scheryl Marten, [place] Admit Date: 08/23/2019  Reason for Consultation/Follow-up: Establishing goals of care  Subjective: I met today with [name].  She was sitting in bedside chair and denied complaints today.  States pain remains controlled since having stent placed.    Her son was at the bedside today.  We discussed appetite, which is slightly improved today.  She has had more ensure and some fresh pineapple, as well as some cereal for breakfast.    Family is focused on appetite as lack of nutrition has been a big concern and [name] is hopeful that this will continue improve.    We discussed increasing her remeron dose this evening to see if any further benefit in appetite.  She had BM this morning and she is hopeful this will improve appetite as well.  Length of Stay: 8  Current Medications: Scheduled Meds:  . carvedilol  6.25 mg Oral BID WC  . feeding supplement (ENSURE ENLIVE)  237 mL Oral TID BM  . heparin  5,000 Units Subcutaneous Q8H  . levothyroxine  25 mcg Oral QAC breakfast  . mouth rinse  15 mL Mouth Rinse BID  . mirtazapine  7.5 mg Oral QHS  . polyethylene glycol  17 g Oral Daily  . senna-docusate  1 tablet Oral Daily  . simvastatin  40 mg Oral QPM  . sodium chloride flush  3 mL Intravenous Once    Continuous Infusions:   PRN Meds: acetaminophen **OR** acetaminophen, lip balm, ondansetron **OR** ondansetron (ZOFRAN) IV, traMADol  Physical Exam         General: Alert, awake, in no acute distress.  Tired appearing, but more interactive and smiling than yesterday.  Heart: Regular  rate and rhythm. No murmur appreciated. Lungs: Good air movement, clear Abdomen: Soft, nontender, nondistended, positive bowel sounds.  Ext: No significant edema Skin: Warm and dry  Vital Signs: BP (!) 159/62 (BP Location: Right Arm)   Pulse 79   Temp 97.9 F (36.6 C) (Oral)   Resp 20   Ht 5' (1.524 m)   Wt 66.1 kg   SpO2 96%  BMI 28.46 kg/m  SpO2: SpO2: 96 % O2 Device: O2 Device: Room Air O2 Flow Rate: O2 Flow Rate (L/min): 3 L/min  Intake/output summary:   Intake/Output Summary (Last 24 hours) at 09/01/2019 1334 Last data filed at 09/01/2019 4098 Gross per 24 hour  Intake 170 ml  Output 450 ml  Net -280 ml   LBM: Last BM Date: 08/29/19 Baseline Weight: Weight: 58.5 kg Most recent weight: Weight: 66.1 kg       Palliative Assessment/Data:    Flowsheet Rows     Most Recent Value  Intake Tab  Referral Department  Hospitalist  Unit at Time of Referral  Oncology Unit  Palliative Care Primary Diagnosis  Cancer  Date Notified  08/25/19  Palliative Care Type  New Palliative care  Reason for referral  Clarify Goals of Care  Date of Admission  08/23/19  Date first seen by Palliative Care  08/26/19  # of days Palliative referral response time  1 Day(s)  # of days IP prior to Palliative referral  2  Clinical Assessment  Palliative Performance Scale Score  60%  Psychosocial & Spiritual Assessment  Palliative Care Outcomes  Patient/Family meeting held?  Yes  Who was at the meeting?  Patient, daughter, son      Patient Active Problem List   Diagnosis Date Noted  . Cancer of renal pelvis, right ([place]) 08/27/2019  . AKI (acute kidney injury) ([place]) 08/24/2019  . Acute renal failure superimposed on stage 3b chronic kidney disease ([place]) 08/23/2019  . Type 2 diabetes mellitus with stage 3 chronic kidney disease ([place]) 08/23/2019  . Hyperlipidemia associated with type 2 diabetes mellitus ([place]) 08/23/2019  . Normocytic anemia 08/23/2019  . Hypothyroidism 08/23/2019  .  Hydronephrosis of right kidney 08/23/2019  . Hyponatremia 08/23/2019  . Hip pain 04/22/2019  . Pain in left knee 06/07/2017  . Acute non-infective otitis externa of right ear 10/31/2016  . Impacted cerumen of both ears 12/27/2015  . Marginal perforation of tympanic membrane of right ear 12/27/2015  . Sensorineural hearing loss (SNHL), bilateral 12/27/2015  . S/P lumbar laminectomy 08/19/2015  . Ischemic cardiomyopathy   . Chronic systolic CHF (congestive heart failure) ([place]) 08/18/2011  . Pleural effusion 08/18/2011  . Cellulitis 08/18/2011  . Dyspnea 08/17/2011  . Malignant neoplasm of anterior mediastinum ([place]) 08/07/2011  . Physical deconditioning 07/19/2011  . Cancer ([place]) 07/08/2011  . Acute anterior wall MI ([place]) 07/07/2011  . Myocardial infarction ([place]) 07/07/2011  . Hypertension associated with diabetes ([place])   . Osteoarthritis of knee   . CAD (coronary artery disease)   . VSD (ventricular septal defect)     Palliative Care Assessment & Plan   Patient Profile: 86 yo female with new renal papillary carcinoma  Recommendations/Plan: - Met today with patient and her son, [name].  Family hopeful that she will be able to rehab to regain functional status.  Much of this will depend on her nutrition, which remains compromised.   - [name] continues to report poor appetite.   Continue trial of Remeron to see if this benefits her and helping sleep and also with her appetite. Will increase Remeron to 53m nightly.  I also believe that constipation may be contributing factor as well.  She did have BM today. - Constipation: Had BM today.  Continue senna and miralax. - Plan is for follow-up with medical oncology to discuss potential disease modifying therapy for newly diagnosed urothelial cancer.  Per telephone note in chart, appointment set for Dr. SAlen Blew  on 4/2. - Recommend palliative care to follow with patient at discharge.  As she is a [facility] patient she is candidate for care  connections program through [facility] [place].  I called and discussed with liaison and they will follow [name] once she transitions home (their program does not follow patients at [facility]). - Discussed with TOC team.  Appreciate assistance in finding placement for [name].   Code Status:    Code Status Orders  (From admission, onward)         Start     Ordered   08/23/19 2338  Do not attempt resuscitation (DNR)  Continuous    Question Answer Comment  In the event of cardiac or respiratory ARREST Do not call a "code blue"   In the event of cardiac or respiratory ARREST Do not perform Intubation, CPR, defibrillation or ACLS   In the event of cardiac or respiratory ARREST Use medication by any route, position, wound care, and other measures to relive pain and suffering. May use oxygen, suction and manual treatment of airway obstruction as needed for comfort.      08/23/19 2338        Code Status History    Date Active Date Inactive Code Status Order ID Comments User Context   04/22/2019 2045 04/24/2019 1721 DNR [id number]  Truddie Hidden, MD Inpatient   07/17/2011 1340 07/18/2011 2016 Full Code [id number]  Grace Isaac, MD Inpatient   Advance Care Planning Activity    Advance Directive Documentation     Most Recent Value  Type of Advance Directive  Healthcare Power of Attorney, Living will  Pre-existing out of facility DNR order (yellow form or pink MOST form)  --  "MOST" Form in Place?  --       Prognosis: Guarded  Discharge Planning:  [place] for rehab with Palliative care service follow-up on return home  Care plan was discussed with patient, son, and daughter  Thank you for allowing the Palliative Medicine Team to assist in the care of this patient.  Time in: 1150 Time out: 1215 Total Time 25 Prolonged Time Billed No      Greater than 50%  of this time was spent counseling and coordinating care related to the above assessment and  plan.  Micheline Rough, MD  Please contact Palliative Medicine Team phone at [phone] for questions and concerns.

## 2019-09-01 NOTE — Progress Notes (Signed)
PROGRESS NOTE  [name] [id number] DOB: 1924/11/28 DOA: 08/23/2019 PCP: Scheryl Marten, PA  HPI/Recap of past 63 hours: 86 year old female with history of CAD status post CABG, ICM, chronic systolic CHF with EF of 30 to 35% in 2015, chronic kidney disease stage III, hypertension, hyperlipidemia, hypothyroidism, VSD status post patch repair, diabetes mellitus type 2, anemia of chronic disease, history of thymoma status post resection and radiation presented with generalized weakness and decreased p.o. intake.  She has been following with urology as an outpatient and had plans for cystoscopy/ureteroscopy on 08/27/2019.  Preop labs on 08/21/2019 showed worsening renal function with creatinine of 2.58 compared to baseline of 1.4 along with hyponatremia of 126.  Patient was admitted for the same.  She had cystoscopy and ureteroscopy which showed probable right renal pelvis cancer and patient underwent right ureteral stent placement by urology.  Urology recommended that patient is not a surgical candidate and recommended outpatient follow-up with urology.  Palliative care also was consulted.  Currently awaiting SNF placement.  09/01/19:  Seen and examined, reports R flank pain.   Assessment/Plan: Principal Problem:   Acute renal failure superimposed on stage 3b chronic kidney disease (HCC) Active Problems:   Hypertension associated with diabetes ([place])   CAD (coronary artery disease)   Chronic systolic CHF (congestive heart failure) (HCC)   Type 2 diabetes mellitus with stage 3 chronic kidney disease (HCC)   Hyperlipidemia associated with type 2 diabetes mellitus (HCC)   Normocytic anemia   Hypothyroidism   Hydronephrosis of right kidney   Hyponatremia   AKI (acute kidney injury) ([place])   Cancer of renal pelvis, right ([place])   Newly diagnosed right renal pelvis/ureteral cancer by biopsy and stent placement on 08/27/2019 by Dr. Tresa Moore Plan to follow-up with urology outpatient and to  follow-up with oncology Dr. Alen Blew Palliative care team has been consulted to assist with establishing goals of care. Continue management as directed by urology  AKI on CKD 3 likely secondary to obstructive malignancy Baseline creatinine appears to be 1.4 with GFR of 32 Presented with creatinine greater than 2.5 Creatinine trending down Continue to avoid nephrotoxins, dehydration and hypotension  Hypovolemic hyponatremia, resolved.  Hypomagnesemia Repleted with 1 g of IV magnesium.  Klebsiella/E. coli UTI Completed course of antibiotics  Chronic systolic CHF Euvolemic on exam Continue strict I's and O's and daily weight Continue cardiac medications  Hypothyroidism-continue Synthroid  Anemia of chronic disease in the setting of CKD Stable No overt bleeding  DM2 A1c 6.6 ISS  HLD C/w statin  Physical debility PT OT rec SNF TOC assisting with placement  DVT prophylaxis: Heparin sq TIID Code Status: DNR Family Communication: will update family  Disposition Plan: Currently medically stable for discharge to SNF.  SNF once bed is available.  Consultants: Urology/palliative care/ curb-sided with oncology Dr. Alen Blew 3/29.  Procedures: Cystoscopy/ureteroscopy and right-sided stent placement on 08/27/2019 by urology  Antimicrobials:  Rocephin from 08/24/2019-08/27/2019 Keflex from 08/28/2019-08/30/2019     Objective: Vitals:   08/31/19 0657 08/31/19 1420 08/31/19 2054 09/01/19 0615  BP:  (!) 161/54 (!) 138/50 (!) 159/62  Pulse:  72 71 79  Resp:  15 18 20   Temp:  (!) 97.5 F (36.4 C) 97.7 F (36.5 C) 97.9 F (36.6 C)  TempSrc:  Oral Oral Oral  SpO2:  95% 91% 96%  Weight: 64.6 kg   66.1 kg  Height:        Intake/Output Summary (Last 24 hours) at 09/01/2019 1312 Last data filed at  09/01/2019 [id number] Gross per 24 hour  Intake 170 ml  Output 450 ml  Net -280 ml   Filed Weights   08/30/19 0618 08/31/19 0657 09/01/19 0615  Weight: 66.5 kg 64.6 kg 66.1 kg     Exam:  . General: 86 y.o. year-old female well developed well nourished in no acute distress.  Alert and interactive. . Cardiovascular: Regular rate and rhythm with no rubs or gallops.  No thyromegaly or JVD noted.   Marland Kitchen Respiratory: Clear to auscultation with no wheezes or rales. Good inspiratory effort. . Abdomen: Soft nontender nondistended with normal bowel sounds x4 quadrants. . Musculoskeletal: No lower extremity edema. 2/4 pulses in all 4 extremities. Marland Kitchen Psychiatry: Mood is appropriate for condition and setting   Data Reviewed: CBC: Recent Labs  Lab 08/27/19 0525 08/28/19 0510 08/30/19 0521 08/31/19 0531  WBC 13.3* 13.6* 11.5* 13.2*  NEUTROABS  --  12.2* 9.7* 10.9*  HGB 9.1* 8.8* 8.5* 8.3*  HCT 29.1* 28.2* 26.2* 26.8*  MCV 83.9 85.5 83.2 85.1  PLT 359 334 265 [id number]   Basic Metabolic Panel: Recent Labs  Lab 08/27/19 0525 08/28/19 0510 08/29/19 0548 08/30/19 0521 08/31/19 0531  NA 134* 135 136 136 138  K 4.6 4.8 4.6 3.8 4.0  CL 109 108 111 102 103  CO2 15* 17* 16* 24 25  GLUCOSE 152* 145* 136* 206* 136*  BUN 26* 33* 45* 42* 36*  CREATININE 1.90* 1.92* 1.96* 1.77* 1.74*  CALCIUM 9.4 9.4 9.7 9.0 9.2  MG  --  1.6* 1.8 1.8 1.7   GFR: Estimated Creatinine Clearance: 17.5 mL/min (A) (by C-G formula based on SCr of 1.74 mg/dL (H)). Liver Function Tests: No results for input(s): AST, ALT, ALKPHOS, BILITOT, PROT, ALBUMIN in the last 168 hours. No results for input(s): LIPASE, AMYLASE in the last 168 hours. No results for input(s): AMMONIA in the last 168 hours. Coagulation Profile: No results for input(s): INR, PROTIME in the last 168 hours. Cardiac Enzymes: No results for input(s): CKTOTAL, CKMB, CKMBINDEX, TROPONINI in the last 168 hours. BNP (last 3 results) No results for input(s): PROBNP in the last 8760 hours. HbA1C: No results for input(s): HGBA1C in the last 72 hours. CBG: Recent Labs  Lab 08/27/19 1146 08/28/19 2120 08/29/19 2206  GLUCAP 142* 140*  217*   Lipid Profile: No results for input(s): CHOL, HDL, LDLCALC, TRIG, CHOLHDL, LDLDIRECT in the last 72 hours. Thyroid Function Tests: No results for input(s): TSH, T4TOTAL, FREET4, T3FREE, THYROIDAB in the last 72 hours. Anemia Panel: No results for input(s): VITAMINB12, FOLATE, FERRITIN, TIBC, IRON, RETICCTPCT in the last 72 hours. Urine analysis:    Component Value Date/Time   COLORURINE YELLOW 08/23/2019 2102   APPEARANCEUR HAZY (A) 08/23/2019 2102   LABSPEC 1.012 08/23/2019 2102   PHURINE 5.0 08/23/2019 2102   GLUCOSEU NEGATIVE 08/23/2019 2102   HGBUR SMALL (A) 08/23/2019 2102   BILIRUBINUR NEGATIVE 08/23/2019 2102   KETONESUR 5 (A) 08/23/2019 2102   PROTEINUR NEGATIVE 08/23/2019 2102   UROBILINOGEN 1.0 07/19/2011 1755   NITRITE NEGATIVE 08/23/2019 2102   LEUKOCYTESUR MODERATE (A) 08/23/2019 2102   Sepsis Labs: @LABRCNTIP (procalcitonin:4,lacticidven:4)  ) Recent Results (from the past 240 hour(s))  SARS CORONAVIRUS 2 (TAT 6-24 HRS) Nasopharyngeal Nasopharyngeal Swab     Status: None   Collection Time: 08/23/19  1:21 PM   Specimen: Nasopharyngeal Swab  Result Value Ref Range Status   SARS Coronavirus 2 NEGATIVE NEGATIVE Final    Comment: (NOTE) SARS-CoV-2 target nucleic acids are NOT DETECTED. The  SARS-CoV-2 RNA is generally detectable in upper and lower respiratory specimens during the acute phase of infection. Negative results do not preclude SARS-CoV-2 infection, do not rule out co-infections with other pathogens, and should not be used as the sole basis for treatment or other patient management decisions. Negative results must be combined with clinical observations, patient history, and epidemiological information. The expected result is Negative. Fact Sheet for Patients: SugarRoll.be Fact Sheet for Healthcare Providers: https://www.woods-mathews.com/ This test is not yet approved or cleared by the [place] FDA  and  has been authorized for detection and/or diagnosis of SARS-CoV-2 by FDA under an Emergency Use Authorization (EUA). This EUA will remain  in effect (meaning this test can be used) for the duration of the COVID-19 declaration under Section 56 4(b)(1) of the Act, 21 U.S.C. section 360bbb-3(b)(1), unless the authorization is terminated or revoked sooner. Performed at [facility], [place] [address] [place], [place] [address]   Urine Culture     Status: Abnormal   Collection Time: 08/24/19 11:16 AM   Specimen: Urine, Clean Catch  Result Value Ref Range Status   Specimen Description   Final    URINE, CLEAN CATCH Performed at [facility], Center [address] [place], [place] [address]    Special Requests   Final    NONE Performed at [facility], [place] [address], [place], [place] [address]    Culture (A)  Final    >=100,000 COLONIES/mL KLEBSIELLA PNEUMONIAE >=100,000 COLONIES/mL ESCHERICHIA COLI    Report Status 08/27/2019 FINAL  Final   Organism ID, Bacteria KLEBSIELLA PNEUMONIAE (A)  Final   Organism ID, Bacteria ESCHERICHIA COLI (A)  Final      Susceptibility   Escherichia coli - MIC*    AMPICILLIN 8 SENSITIVE Sensitive     CEFAZOLIN <=4 SENSITIVE Sensitive     CEFTRIAXONE <=0.25 SENSITIVE Sensitive     CIPROFLOXACIN <=0.25 SENSITIVE Sensitive     GENTAMICIN >=16 RESISTANT Resistant     IMIPENEM <=0.25 SENSITIVE Sensitive     NITROFURANTOIN <=16 SENSITIVE Sensitive     TRIMETH/SULFA <=20 SENSITIVE Sensitive     AMPICILLIN/SULBACTAM 4 SENSITIVE Sensitive     PIP/TAZO <=4 SENSITIVE Sensitive     * >=100,000 COLONIES/mL ESCHERICHIA COLI   Klebsiella pneumoniae - MIC*    AMPICILLIN >=32 RESISTANT Resistant     CEFAZOLIN <=4 SENSITIVE Sensitive     CEFTRIAXONE <=0.25 SENSITIVE Sensitive     CIPROFLOXACIN <=0.25 SENSITIVE Sensitive     GENTAMICIN <=1 SENSITIVE Sensitive     IMIPENEM <=0.25 SENSITIVE Sensitive     NITROFURANTOIN 64  INTERMEDIATE Intermediate     TRIMETH/SULFA <=20 SENSITIVE Sensitive     AMPICILLIN/SULBACTAM 8 SENSITIVE Sensitive     PIP/TAZO <=4 SENSITIVE Sensitive     * >=100,000 COLONIES/mL KLEBSIELLA PNEUMONIAE  Surgical pcr screen     Status: None   Collection Time: 08/26/19 10:42 PM   Specimen: Nasal Mucosa; Nasal Swab  Result Value Ref Range Status   MRSA, PCR NEGATIVE NEGATIVE Final   Staphylococcus aureus NEGATIVE NEGATIVE Final    Comment: (NOTE) The Xpert SA Assay (FDA approved for NASAL specimens in patients 74 years of age and older), is one component of a comprehensive surveillance program. It is not intended to diagnose infection nor to guide or monitor treatment. Performed at [facility], [place] [address], [place], [place] [address]       Studies: No results found.  Scheduled Meds: . carvedilol  6.25 mg Oral BID WC  . feeding supplement (ENSURE ENLIVE)  237 mL Oral TID BM  . heparin  5,000 Units Subcutaneous Q8H  . levothyroxine  25 mcg Oral QAC breakfast  . mouth rinse  15 mL Mouth Rinse BID  . mirtazapine  7.5 mg Oral QHS  . polyethylene glycol  17 g Oral Daily  . senna-docusate  1 tablet Oral Daily  . simvastatin  40 mg Oral QPM  . sodium chloride flush  3 mL Intravenous Once    Continuous Infusions:   LOS: 8 days     Kayleen Memos, MD [facility] Pager [phone]  If 7PM-7AM, please contact night-coverage [email or web address] Password [email or web address] 09/01/2019, 1:12 PM

## 2019-09-01 NOTE — Progress Notes (Signed)
Physical Therapy Treatment Patient Details Name: [name] MRN: [id number] DOB: Feb 08, 1925 Today's Date: 09/01/2019    History of Present Illness [name] is a 86 y.o. female with medical history significant for CAD s/p CABG, ICM, chronic systolic CHF (EF [id number] in 2015), CKD stage III, hypertension, hyperlipidemia, hypothyroidism, VSD s/p patch repair, type 2 diabetes, anemia of chronic disease, and history of thymoma s/p resection and radiation who presents to the ED for evaluation of generalized weakness.    PT Comments    Patient making limited progress with therapy and continues to require increased assistance for bed mobility and transfers due to progressing weakness. Pt's daughter present for entire session and son for half of session. Discussed pt's mobility needs for 2+ assist and equipment needs. Educated on recommendation for SNF follow up; family has expressed interest in SNF. Acute PT will continue to follow and progress pt as able.   Follow Up Recommendations  SNF     Equipment Recommendations  Other (comment)(TBD at follow up venue)    Recommendations for Other Services       Precautions / Restrictions Precautions Precautions: Fall Precaution Comments: Monitor skin and joint integrity closely- frail and prone to skin tears and bruising. Restrictions Weight Bearing Restrictions: No    Mobility  Bed Mobility   Bed Mobility: Supine to Sit     Supine to sit: Mod assist;HOB elevated        Transfers Overall transfer level: Needs assistance   Transfers: Sit to/from Stand;Stand Pivot Transfers Sit to Stand: Max assist;+2 physical assistance;+2 safety/equipment;From elevated surface Stand pivot transfers: Total assist;+2 safety/equipment(Stedy)       General transfer comment: pt continues to be limitd by weakness and postural impairments with sit<>stand transfers. Max assist required for positioning in preparation for stand and to initiate power  up. pt with significant posterior lean requirign max +2 to shit hips forward for more upright posture. Pt unable to obtain full upright standing from Stedy paddles on 3rs/4th attempt. on 5th final stand pt able to rise with max assist to clear paddles and sit in recliner.   Ambulation/Gait          Stairs        Wheelchair Mobility    Modified Rankin (Stroke Patients Only)       Balance   Sitting-balance support: Bilateral upper extremity supported;Feet unsupported Sitting balance-Leahy Scale: Poor     Standing balance support: Bilateral upper extremity supported;During functional activity Standing balance-Leahy Scale: Poor           Cognition Arousal/Alertness: Lethargic Behavior During Therapy: WFL for tasks assessed/performed Overall Cognitive Status: Impaired/Different from baseline Area of Impairment: Following commands;Memory;Safety/judgement;Awareness;Problem solving      Memory: Decreased short-term memory Following Commands: Follows one step commands inconsistently Safety/Judgement: Decreased awareness of safety Awareness: Emergent Problem Solving: Slow processing;Difficulty sequencing;Requires verbal cues General Comments: pt lethargic today but improved with activity. pt required repeated cues for tasks. Pt with difficulty speaking throughout session and after a small drink of water pt able to speak more clearly.      Exercises Other Exercises Other Exercises: pt using bil LE's to push stedy away from herself.     General Comments        Pertinent Vitals/Pain Pain Assessment: No/denies pain           PT Goals (current goals can now be found in the care plan section) Acute Rehab PT Goals Patient Stated Goal: return home PT Goal Formulation: With  patient/family Time For Goal Achievement: 09/07/19 Potential to Achieve Goals: Fair Progress towards PT goals: Not progressing toward goals - comment;PT to reassess next treatment     Frequency    Min 2X/week      PT Plan Discharge plan needs to be updated;Frequency needs to be updated       AM-PAC PT "6 Clicks" Mobility   Outcome Measure  Help needed turning from your back to your side while in a flat bed without using bedrails?: A Lot Help needed moving from lying on your back to sitting on the side of a flat bed without using bedrails?: Total Help needed moving to and from a bed to a chair (including a wheelchair)?: Total Help needed standing up from a chair using your arms (e.g., wheelchair or bedside chair)?: Total Help needed to walk in hospital room?: Total Help needed climbing 3-5 steps with a railing? : Total 6 Click Score: 7    End of Session Equipment Utilized During Treatment: Gait belt;Oxygen Activity Tolerance: Patient limited by fatigue Patient left: in chair;with call bell/phone within reach;with chair alarm set;with family/visitor present Nurse Communication: Mobility status PT Visit Diagnosis: Unsteadiness on feet (R26.81);Muscle weakness (generalized) (M62.81);Difficulty in walking, not elsewhere classified (R26.2)     Time: 1123-1156(last 5-8 minutes with case manager and family discussing DC) PT Time Calculation (min) (ACUTE ONLY): 33 min  Charges:  $Therapeutic Activity: 23-37 mins                     Verner Mould, DPT Physical Therapist with [facility] [phone]  09/01/2019 12:57 PM

## 2019-09-02 LAB — COMPREHENSIVE METABOLIC PANEL
ALT: 16 U/L (ref 0–44)
AST: 16 U/L (ref 15–41)
Albumin: 2.6 g/dL — ABNORMAL LOW (ref 3.5–5.0)
Alkaline Phosphatase: 116 U/L (ref 38–126)
Anion gap: 8 (ref 5–15)
BUN: 41 mg/dL — ABNORMAL HIGH (ref 8–23)
CO2: 27 mmol/L (ref 22–32)
Calcium: 9.4 mg/dL (ref 8.9–10.3)
Chloride: 103 mmol/L (ref 98–111)
Creatinine, Ser: 1.87 mg/dL — ABNORMAL HIGH (ref 0.44–1.00)
GFR calc Af Amer: 27 mL/min — ABNORMAL LOW (ref >60)
GFR calc non Af Amer: 23 mL/min — ABNORMAL LOW (ref >60)
Glucose, Bld: 173 mg/dL — ABNORMAL HIGH (ref 70–99)
Potassium: 4.1 mmol/L (ref 3.5–5.1)
Sodium: 138 mmol/L (ref 135–145)
Total Bilirubin: 0.6 mg/dL (ref 0.3–1.2)
Total Protein: 5.9 g/dL — ABNORMAL LOW (ref 6.5–8.1)

## 2019-09-02 LAB — MAGNESIUM: Magnesium: 2.1 mg/dL (ref 1.7–2.4)

## 2019-09-02 LAB — CBC WITH DIFFERENTIAL/PLATELET
Abs Immature Granulocytes: 0.13 10*3/uL — ABNORMAL HIGH (ref 0.00–0.07)
Basophils Absolute: 0.1 10*3/uL (ref 0.0–0.1)
Basophils Relative: 1 %
Eosinophils Absolute: 0.4 10*3/uL (ref 0.0–0.5)
Eosinophils Relative: 3 %
HCT: 28.9 % — ABNORMAL LOW (ref 36.0–46.0)
Hemoglobin: 8.9 g/dL — ABNORMAL LOW (ref 12.0–15.0)
Immature Granulocytes: 1 %
Lymphocytes Relative: 4 %
Lymphs Abs: 0.5 10*3/uL — ABNORMAL LOW (ref 0.7–4.0)
MCH: 26.4 pg (ref 26.0–34.0)
MCHC: 30.8 g/dL (ref 30.0–36.0)
MCV: 85.8 fL (ref 80.0–100.0)
Monocytes Absolute: 1.1 10*3/uL — ABNORMAL HIGH (ref 0.1–1.0)
Monocytes Relative: 7 %
Neutro Abs: 12.6 10*3/uL — ABNORMAL HIGH (ref 1.7–7.7)
Neutrophils Relative %: 84 %
Platelets: 329 10*3/uL (ref 150–400)
RBC: 3.37 MIL/uL — ABNORMAL LOW (ref 3.87–5.11)
RDW: 15.4 % (ref 11.5–15.5)
WBC: 14.8 10*3/uL — ABNORMAL HIGH (ref 4.0–10.5)
nRBC: 0 % (ref 0.0–0.2)

## 2019-09-02 LAB — LACTIC ACID, PLASMA: Lactic Acid, Venous: 1.3 mmol/L (ref 0.5–1.9)

## 2019-09-02 LAB — PHOSPHORUS: Phosphorus: 3.1 mg/dL (ref 2.5–4.6)

## 2019-09-02 LAB — PROCALCITONIN: Procalcitonin: <0.1 ng/mL

## 2019-09-02 MED ORDER — LACTATED RINGERS IV SOLN
INTRAVENOUS | Status: DC
  Administered 2019-09-02: 1000 mL via INTRAVENOUS

## 2019-09-02 NOTE — Progress Notes (Signed)
I was asked by Dr. Nevada Crane to comment on [name] case.  She is not known to me at this point but I reviewed her chart to answer specific question.  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  There are no plans for systemic therapy at this point.  Given her advanced age and poor functional status there is certainly no plans to do this inpatient.  I recommend outpatient oncology follow-up upon her discharge we can revisit this in the future.  There is no anticancer oral medication prescribed for this particular indication.   Please call with any questions.

## 2019-09-02 NOTE — Progress Notes (Signed)
PROGRESS NOTE  [name] [id number] DOB: [birth year] DOA: 08/23/2019 PCP: Scheryl Marten, PA  HPI/Recap of past 103 hours: 87 year old female with history of CAD status post CABG, ICM, chronic systolic CHF with EF of 30 to 35% in 2015, chronic kidney disease stage III, hypertension, hyperlipidemia, hypothyroidism, VSD status post patch repair, diabetes mellitus type 2, anemia of chronic disease, history of thymoma status post resection and radiation presented with generalized weakness and decreased p.o. intake.  She has been following with urology as an outpatient and had plans for cystoscopy/ureteroscopy on 08/27/2019.  Preop labs on 08/21/2019 showed worsening renal function with creatinine of 2.58 compared to baseline of 1.4 along with hyponatremia of 126.  Patient was admitted for the same.  She had cystoscopy and ureteroscopy which showed probable right renal pelvis cancer and patient underwent right ureteral stent placement by urology.  Urology recommended that patient is not a surgical candidate and recommended outpatient follow-up with urology.  Palliative care also was consulted.  Currently awaiting SNF placement.  09/02/19:  Less interactive this AM.  Worsening leukocytosis and neutrophilia.  Repeating cultures.    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  Tresa Moore Plan to follow-up with urology outpatient and to follow-up with oncology Dr. Alen Blew Palliative care team has been consulted to assist with establishing goals of care. Continue management as directed by urology Will follow up with oncology and urology outpatient  Worsening leukocytosis/neutrophilia Repeat Ucx and obtain blood cx peripherally x 2  Off abx, completed recently course of IV abx for E-coli/Klebs UTI Monitor off abx tonight and repeat CBC with diff tomorrow  Worsening AKI on CKD 3 likely secondary to obstructive malignancy Baseline creatinine appears to be 1.4 with GFR of 32 Presented with creatinine greater than 2.5 Cr trending upward 1.87 Start LR 75cc/hr Continue to avoid nephrotoxins, dehydration and hypotension  Hypovolemic hyponatremia, resolved.  Hypomagnesemia, resolved Mg2+ 2.1 Repleted intravenously  Klebsiella/E. coli UTI Completed course of antibiotics  Chronic systolic CHF Hypovolemic on exam Closely monitor on IV fluid Continue strict I's and O's and daily weight Continue cardiac medications  Hypothyroidism-continue Synthroid  Anemia of chronic disease in the setting of CKD Stable No overt bleeding  DM2 A1c 6.6 ISS  HLD C/w statin  Physical debility PT OT rec SNF TOC assisting with placement  DVT prophylaxis: Heparin sq TID Code Status: DNR Family Communication: will update family  Disposition Plan: Plan to dc to SNF once active infective process is ruled out.  Consultants: Urology/palliative care/ curb-sided with oncology Dr. Alen Blew 3/29.  Procedures: Cystoscopy/ureteroscopy and right-sided stent placement on 08/27/2019 by urology  Antimicrobials:  Rocephin from 08/24/2019-08/27/2019 Keflex from 08/28/2019-08/30/2019     Objective: Vitals:   09/01/19 2312 09/02/19 0500 09/02/19 0546 09/02/19 1343  BP: (!) 155/51  (!) 170/58 (!) 136/53  Pulse: 71  79 68  Resp: 15  16 17   Temp:  97.7 F (36.5 C)  98 F (36.7 C) 97.7  F (36.5 C)  TempSrc: Oral  Oral Oral  SpO2: 96%  98% 98%  Weight:  63.8 kg    Height:        Intake/Output Summary (Last 24 hours) at 09/02/2019 1639 Last data filed at 09/02/2019 [id number] Gross per 24 hour  Intake 200 ml  Output 1000 ml  Net -800 ml   Filed Weights   08/31/19 0657 09/01/19 0615 09/02/19 0500  Weight: 64.6 kg 66.1 kg 63.8 kg    Exam:  . General: 86 y.o. year-old female Frail, minimally interactive.  In no acute distress. . Cardiovascular: RRR no rubs or gallops . Respiratory:CTA no wheezes or rales . Abdomen:Soft NBS NT . Musculoskeletal: No LE edema . Psychiatry: Mood is appropriate  Data Reviewed: CBC: Recent Labs  Lab 08/27/19 0525 08/28/19 0510 08/30/19 0521 08/31/19 0531 09/02/19 0635  WBC 13.3* 13.6* 11.5* 13.2* 14.8*  NEUTROABS  --  12.2* 9.7* 10.9* 12.6*  HGB 9.1* 8.8* 8.5* 8.3* 8.9*  HCT 29.1* 28.2* 26.2* 26.8* 28.9*  MCV 83.9 85.5 83.2 85.1 85.8  PLT 359 334 265 271 [id number]   Basic Metabolic Panel: Recent Labs  Lab 08/28/19 0510 08/29/19 0548 08/30/19 0521 08/31/19 0531 09/02/19 0635  NA 135 136 136 138 138  K 4.8 4.6 3.8 4.0 4.1  CL 108 111 102 103 103  CO2 17* 16* 24 25 27   GLUCOSE 145* 136* 206* 136* 173*  BUN 33* 45* 42* 36* 41*  CREATININE 1.92* 1.96* 1.77* 1.74* 1.87*  CALCIUM 9.4 9.7 9.0 9.2 9.4  MG 1.6* 1.8 1.8 1.7 2.1  PHOS  --   --   --   --  3.1   GFR: Estimated Creatinine Clearance: 16 mL/min (A) (by C-G formula based on SCr of 1.87 mg/dL (H)). Liver Function Tests: Recent Labs  Lab 09/02/19 0635  AST 16  ALT 16  ALKPHOS 116  BILITOT 0.6  PROT 5.9*  ALBUMIN 2.6*   No results for input(s): LIPASE, AMYLASE in the last 168 hours. No results for input(s): AMMONIA in the last 168 hours. Coagulation Profile: No results for input(s): INR, PROTIME in the last 168 hours. Cardiac Enzymes: No results for input(s): CKTOTAL, CKMB, CKMBINDEX, TROPONINI in the last 168 hours. BNP (last 3 results) No results for  input(s): PROBNP in the last 8760 hours. HbA1C: No results for input(s): HGBA1C in the last 72 hours. CBG: Recent Labs  Lab 08/27/19 1146 08/28/19 2120 08/29/19 2206  GLUCAP 142* 140* 217*   Lipid Profile: No results for input(s): CHOL, HDL, LDLCALC, TRIG, CHOLHDL, LDLDIRECT in the last 72 hours. Thyroid Function Tests: No results for input(s): TSH, T4TOTAL, FREET4, T3FREE, THYROIDAB in the last 72 hours. Anemia Panel: No results for input(s): VITAMINB12, FOLATE, FERRITIN, TIBC, IRON, RETICCTPCT in the last 72 hours. Urine analysis:    Component Value Date/Time   COLORURINE YELLOW 08/23/2019 2102   APPEARANCEUR HAZY (A) 08/23/2019 2102   LABSPEC 1.012 08/23/2019 2102   PHURINE 5.0 08/23/2019 2102   GLUCOSEU NEGATIVE 08/23/2019 2102   HGBUR SMALL (A) 08/23/2019 2102   BILIRUBINUR NEGATIVE 08/23/2019 2102   KETONESUR 5 (A) 08/23/2019 2102   PROTEINUR NEGATIVE 08/23/2019 2102   UROBILINOGEN 1.0 07/19/2011 1755   NITRITE NEGATIVE 08/23/2019 2102   LEUKOCYTESUR MODERATE (A) 08/23/2019 2102   Sepsis Labs: @LABRCNTIP (procalcitonin:4,lacticidven:4)  ) Recent Results (from the past 240 hour(s))  Urine Culture     Status: Abnormal   Collection Time:  08/24/19 11:16 AM   Specimen: Urine, Clean Catch  Result Value Ref Range Status   Specimen Description   Final    URINE, CLEAN CATCH Performed at [facility], [place] [address], [place], [place] [address]    Special Requests   Final    NONE Performed at [facility], [place] [address], [place], [place] [address]    Culture (A)  Final    >=100,000 COLONIES/mL KLEBSIELLA PNEUMONIAE >=100,000 COLONIES/mL ESCHERICHIA COLI    Report Status 08/27/2019 FINAL  Final   Organism ID, Bacteria KLEBSIELLA PNEUMONIAE (A)  Final   Organism ID, Bacteria ESCHERICHIA COLI (A)  Final      Susceptibility   Escherichia coli - MIC*    AMPICILLIN 8 SENSITIVE Sensitive     CEFAZOLIN <=4 SENSITIVE Sensitive      CEFTRIAXONE <=0.25 SENSITIVE Sensitive     CIPROFLOXACIN <=0.25 SENSITIVE Sensitive     GENTAMICIN >=16 RESISTANT Resistant     IMIPENEM <=0.25 SENSITIVE Sensitive     NITROFURANTOIN <=16 SENSITIVE Sensitive     TRIMETH/SULFA <=20 SENSITIVE Sensitive     AMPICILLIN/SULBACTAM 4 SENSITIVE Sensitive     PIP/TAZO <=4 SENSITIVE Sensitive     * >=100,000 COLONIES/mL ESCHERICHIA COLI   Klebsiella pneumoniae - MIC*    AMPICILLIN >=32 RESISTANT Resistant     CEFAZOLIN <=4 SENSITIVE Sensitive     CEFTRIAXONE <=0.25 SENSITIVE Sensitive     CIPROFLOXACIN <=0.25 SENSITIVE Sensitive     GENTAMICIN <=1 SENSITIVE Sensitive     IMIPENEM <=0.25 SENSITIVE Sensitive     NITROFURANTOIN 64 INTERMEDIATE Intermediate     TRIMETH/SULFA <=20 SENSITIVE Sensitive     AMPICILLIN/SULBACTAM 8 SENSITIVE Sensitive     PIP/TAZO <=4 SENSITIVE Sensitive     * >=100,000 COLONIES/mL KLEBSIELLA PNEUMONIAE  Surgical pcr screen     Status: None   Collection Time: 08/26/19 10:42 PM   Specimen: Nasal Mucosa; Nasal Swab  Result Value Ref Range Status   MRSA, PCR NEGATIVE NEGATIVE Final   Staphylococcus aureus NEGATIVE NEGATIVE Final    Comment: (NOTE) The Xpert SA Assay (FDA approved for NASAL specimens in patients 24 years of age and older), is one component of a comprehensive surveillance program. It is not intended to diagnose infection nor to guide or monitor treatment. Performed at [facility], [place] [address], [place], [place] [address]   SARS CORONAVIRUS 2 (TAT 6-24 HRS) Nasopharyngeal Nasopharyngeal Swab     Status: None   Collection Time: 09/01/19 10:04 AM   Specimen: Nasopharyngeal Swab  Result Value Ref Range Status   SARS Coronavirus 2 NEGATIVE NEGATIVE Final    Comment: (NOTE) SARS-CoV-2 target nucleic acids are NOT DETECTED. The SARS-CoV-2 RNA is generally detectable in upper and lower respiratory specimens during the acute phase of infection. Negative results do not preclude  SARS-CoV-2 infection, do not rule out co-infections with other pathogens, and should not be used as the sole basis for treatment or other patient management decisions. Negative results must be combined with clinical observations, patient history, and epidemiological information. The expected result is Negative. Fact Sheet for Patients: SugarRoll.be Fact Sheet for Healthcare Providers: https://www.woods-mathews.com/ This test is not yet approved or cleared by the [place] FDA and  has been authorized for detection and/or diagnosis of SARS-CoV-2 by FDA under an Emergency Use Authorization (EUA). This EUA will remain  in effect (meaning this test can be used) for the duration of the COVID-19 declaration under Section 56 4(b)(1) of the  Act, 21 U.S.C. section 360bbb-3(b)(1), unless the authorization is terminated or revoked sooner. Performed at [facility], [place] [address] [place], [place] [address]   Culture, blood (routine x 2)     Status: None (Preliminary result)   Collection Time: 09/02/19 10:04 AM   Specimen: BLOOD  Result Value Ref Range Status   Specimen Description   Final    BLOOD LEFT ARM Performed at [place] [address] [place], [place] [address]    Special Requests   Final    BOTTLES DRAWN AEROBIC ONLY Blood Culture results may not be optimal due to an inadequate volume of blood received in culture bottles Performed at [place] [address], [place], [place] [address]    Culture   Final    NO GROWTH < 12 HOURS Performed at [place] [address] [place], [place] [address]    Report Status PENDING  Incomplete  Culture, blood (routine x 2)     Status: None (Preliminary result)   Collection Time: 09/02/19 10:07 AM   Specimen: BLOOD  Result Value Ref Range Status   Specimen Description   Final    BLOOD RIGHT ANTECUBITAL Performed at [place] [address], [place], [place] [address]    Special Requests   Final    BOTTLES DRAWN AEROBIC AND ANAEROBIC Blood Culture adequate volume Performed at [place] [address], [place], [place] [address]    Culture   Final    NO GROWTH < 12 HOURS Performed at [place] [address], [place], [place] [address]    Report Status PENDING  Incomplete      Studies: No results found.  Scheduled Meds: . carvedilol  6.25 mg Oral BID WC  . feeding supplement (ENSURE ENLIVE)  237 mL Oral TID BM  . heparin  5,000 Units Subcutaneous Q8H  . levothyroxine  25 mcg Oral QAC breakfast  . mouth rinse  15 mL Mouth Rinse BID  . mirtazapine  15 mg Oral QHS  . polyethylene glycol  17 g Oral Daily  . senna-docusate  1 tablet Oral Daily  . simvastatin  40 mg Oral QPM  . sodium chloride flush  3 mL Intravenous Once    Continuous Infusions:   LOS: 9 days     Kayleen Memos, MD [facility] Pager [phone]  If 7PM-7AM, please contact night-coverage [email or web address] Password [facility] 09/02/2019, 4:39 PM

## 2019-09-02 NOTE — Progress Notes (Addendum)
Urine sent to lab 

## 2019-09-02 NOTE — Progress Notes (Signed)
Occupational Therapy Treatment Patient Details Name: [name] MRN: [id number] DOB: [birth year] Today's Date: 09/02/2019    History of present illness [name] is a 86 y.o. female with medical history significant for CAD s/p CABG, ICM, chronic systolic CHF (EF 99991111 in 2015), CKD stage III, hypertension, hyperlipidemia, hypothyroidism, VSD s/p patch repair, type 2 diabetes, anemia of chronic disease, and history of thymoma s/p resection and radiation who presents to the ED for evaluation of generalized weakness.   OT comments  Pt presents supine in bed agreeable to OT treatment session. Pt tolerated sitting EOB approx 9 min while completing basic ADL tasks including grooming and self-feeding. Pt requiring intermittent minA for seated balance moreso when performing bimanual task. Pt requiring up to maxA for completion of bed mobility. Pt with delayed processing but following majority of basic commands given repetition and multimodal cues. Feel SNF recommendation remains appropriate at this time. Will continue to follow acutely.   Follow Up Recommendations  SNF;Supervision/Assistance - 24 hour    Equipment Recommendations  None recommended by OT          Precautions / Restrictions Precautions Precautions: Fall Precaution Comments: Monitor skin and joint integrity closely- frail and prone to skin tears and bruising. Restrictions Weight Bearing Restrictions: No       Mobility Bed Mobility Overal bed mobility: Needs Assistance Bed Mobility: Supine to Sit;Sit to Supine     Supine to sit: Max assist;HOB elevated Sit to supine: Max assist   General bed mobility comments: assist for LEs over EOB and trunk elevation, assist to guide trunk/LEs when returning to supine   Transfers                 General transfer comment: not attempted     Balance Overall balance assessment: Needs assistance Sitting-balance support: Bilateral upper extremity supported;Single  extremity supported;Feet supported Sitting balance-Leahy Scale: Fair Sitting balance - Comments: pt often seeking at least single UE support, intermittent minA from therapist                                    ADL either performed or assessed with clinical judgement   ADL Overall ADL's : Needs assistance/impaired Eating/Feeding: Set up;Supervision/ safety;Sitting Eating/Feeding Details (indicate cue type and reason): setup with breakfast tray end of session, cutting up food, opening containers, pt drinking cup of water while seated EOB with supervision/minguard for balance  Grooming: Wash/dry face;Brushing hair;Min guard;Minimal assistance;Sitting Grooming Details (indicate cue type and reason): light minA for sitting balance EOB during grooming ADL                               General ADL Comments: pt tolerated sitting EOB approx 7-9 min during session, completing grooming and self-feeding ADL tasks                        Cognition Arousal/Alertness: Awake/alert;Lethargic(intermittently lethargic) Behavior During Therapy: WFL for tasks assessed/performed Overall Cognitive Status: Impaired/Different from baseline Area of Impairment: Following commands;Memory;Safety/judgement;Awareness;Problem solving                     Memory: Decreased short-term memory Following Commands: Follows one step commands inconsistently;Follows one step commands with increased time Safety/Judgement: Decreased awareness of safety Awareness: Emergent Problem Solving: Slow processing;Difficulty sequencing;Requires verbal cues General Comments: requires intermittent cues  for response to questions or following commands, making needs known PRN during session         Exercises     Shoulder Instructions       General Comments      Pertinent Vitals/ Pain       Pain Assessment: No/denies pain  Home Living                                           Prior Functioning/Environment              Frequency  Min 2X/week        Progress Toward Goals  OT Goals(current goals can now be found in the care plan section)  Progress towards OT goals: Progressing toward goals  Acute Rehab OT Goals Patient Stated Goal: return home OT Goal Formulation: With patient Time For Goal Achievement: 09/07/19 Potential to Achieve Goals: Good ADL Goals Pt Will Perform Grooming: with min guard assist;standing Pt Will Perform Lower Body Bathing: with min assist;sit to/from stand;sitting/lateral leans;with adaptive equipment Pt Will Perform Lower Body Dressing: with min assist;sitting/lateral leans;sit to/from stand;with adaptive equipment Pt Will Transfer to Toilet: with supervision;ambulating;regular height toilet;grab bars Pt Will Perform Tub/Shower Transfer: Shower transfer;ambulating;shower seat;rolling walker  Plan Discharge plan remains appropriate    Co-evaluation                 AM-PAC OT "6 Clicks" Daily Activity     Outcome Measure   Help from another person eating meals?: A Little Help from another person taking care of personal grooming?: A Lot Help from another person toileting, which includes using toliet, bedpan, or urinal?: Total Help from another person bathing (including washing, rinsing, drying)?: A Lot Help from another person to put on and taking off regular upper body clothing?: A Little Help from another person to put on and taking off regular lower body clothing?: A Lot 6 Click Score: 13    End of Session Equipment Utilized During Treatment: Oxygen  OT Visit Diagnosis: Unsteadiness on feet (R26.81);Other abnormalities of gait and mobility (R26.89);Muscle weakness (generalized) (M62.81)   Activity Tolerance Patient tolerated treatment well   Patient Left in bed;with call bell/phone within reach;with bed alarm set   Nurse Communication Mobility status        Time: [id number] OT Time Calculation  (min): 33 min  Charges: OT General Charges $OT Visit: 1 Visit OT Treatments $Self Care/Home Management : 23-37 mins  Lou Cal, [place] Pager [phone] Office [phone]    Raymondo Band 09/02/2019, 9:52 AM

## 2019-09-03 LAB — CBC WITH DIFFERENTIAL/PLATELET
Abs Immature Granulocytes: 0.12 10*3/uL — ABNORMAL HIGH (ref 0.00–0.07)
Basophils Absolute: 0.1 10*3/uL (ref 0.0–0.1)
Basophils Relative: 0 %
Eosinophils Absolute: 0.6 10*3/uL — ABNORMAL HIGH (ref 0.0–0.5)
Eosinophils Relative: 4 %
HCT: 28.8 % — ABNORMAL LOW (ref 36.0–46.0)
Hemoglobin: 8.9 g/dL — ABNORMAL LOW (ref 12.0–15.0)
Immature Granulocytes: 1 %
Lymphocytes Relative: 5 %
Lymphs Abs: 0.7 10*3/uL (ref 0.7–4.0)
MCH: 26.9 pg (ref 26.0–34.0)
MCHC: 30.9 g/dL (ref 30.0–36.0)
MCV: 87 fL (ref 80.0–100.0)
Monocytes Absolute: 1.2 10*3/uL — ABNORMAL HIGH (ref 0.1–1.0)
Monocytes Relative: 9 %
Neutro Abs: 11.1 10*3/uL — ABNORMAL HIGH (ref 1.7–7.7)
Neutrophils Relative %: 81 %
Platelets: 331 10*3/uL (ref 150–400)
RBC: 3.31 MIL/uL — ABNORMAL LOW (ref 3.87–5.11)
RDW: 15.5 % (ref 11.5–15.5)
WBC: 13.7 10*3/uL — ABNORMAL HIGH (ref 4.0–10.5)
nRBC: 0 % (ref 0.0–0.2)

## 2019-09-03 LAB — BASIC METABOLIC PANEL
Anion gap: 9 (ref 5–15)
BUN: 44 mg/dL — ABNORMAL HIGH (ref 8–23)
CO2: 26 mmol/L (ref 22–32)
Calcium: 9.6 mg/dL (ref 8.9–10.3)
Chloride: 106 mmol/L (ref 98–111)
Creatinine, Ser: 1.95 mg/dL — ABNORMAL HIGH (ref 0.44–1.00)
GFR calc Af Amer: 25 mL/min — ABNORMAL LOW (ref >60)
GFR calc non Af Amer: 22 mL/min — ABNORMAL LOW (ref >60)
Glucose, Bld: 163 mg/dL — ABNORMAL HIGH (ref 70–99)
Potassium: 4.3 mmol/L (ref 3.5–5.1)
Sodium: 141 mmol/L (ref 135–145)

## 2019-09-03 LAB — URINALYSIS, ROUTINE W REFLEX MICROSCOPIC
Bilirubin Urine: NEGATIVE
Glucose, UA: NEGATIVE mg/dL
Ketones, ur: NEGATIVE mg/dL
Nitrite: NEGATIVE
Protein, ur: 100 mg/dL — AB
RBC / HPF: >50 RBC/hpf — ABNORMAL HIGH (ref 0–5)
Specific Gravity, Urine: 1.015 (ref 1.005–1.030)
pH: 8 (ref 5.0–8.0)

## 2019-09-03 MED ORDER — CIPROFLOXACIN HCL 500 MG PO TABS
500.0000 mg | ORAL_TABLET | Freq: Every day | ORAL | Status: DC
  Administered 2019-09-03 – 2019-09-04 (×2): 500 mg via ORAL
  Filled 2019-09-03 (×2): qty 1

## 2019-09-03 MED ORDER — LACTATED RINGERS IV SOLN: INTRAVENOUS | Status: DC

## 2019-09-03 NOTE — Progress Notes (Signed)
Physical Therapy Treatment Patient Details Name: MAECIE KILZER MRN: [id number] DOB: [birth year] Today's Date: 09/03/2019    History of Present Illness [name] is a 86 y.o. female with medical history significant for CAD s/p CABG, ICM, chronic systolic CHF (EF 99991111 in 2015), CKD stage III, hypertension, hyperlipidemia, hypothyroidism, VSD s/p patch repair, type 2 diabetes, anemia of chronic disease, and history of thymoma s/p resection and radiation who presents to the ED for evaluation of generalized weakness.    PT Comments    Pt continues to require assist of 2 for transfers; however, she was able to perform sit to stand 5x2 from [place] (high perch position) with supervision.  Pt required max cues for sequencing with transfers and for exercise technique. Demonstrates gradual progress. Continue to recommend SNF.   Follow Up Recommendations  SNF     Equipment Recommendations  Wheelchair (measurements PT);Hospital bed;3in1 (PT);Wheelchair cushion (measurements PT)    Recommendations for Other Services       Precautions / Restrictions Precautions Precautions: Fall Precaution Comments: Monitor skin and joint integrity closely- frail and prone to skin tears and bruising.    Mobility  Bed Mobility Overal bed mobility: Needs Assistance Bed Mobility: Supine to Sit     Supine to sit: Mod assist;+2 for physical assistance     General bed mobility comments: required cues for sequencing and weight shifting and mod A of 2 to elevate trunk and scoot forward  Transfers Overall transfer level: Needs assistance   Transfers: Sit to/from Stand Sit to Stand: Max assist;+2 physical assistance Stand pivot transfers: Total assist([place])       General transfer comment: Pt reports max x 2 to stand from EOB - given mulitmodal cues and demo for use of momentum to stand but pt unable  ; see exercises for standing from [facility].  Ambulation/Gait                 Stairs             Wheelchair Mobility    Modified Rankin (Stroke Patients Only)       Balance Overall balance assessment: Needs assistance Sitting-balance support: Single extremity supported;Feet supported Sitting balance-Leahy Scale: Fair     Standing balance support: Bilateral upper extremity supported;During functional activity Standing balance-Leahy Scale: Fair                              Cognition Arousal/Alertness: Awake/alert Behavior During Therapy: WFL for tasks assessed/performed Overall Cognitive Status: No family/caregiver present to determine baseline cognitive functioning                         Following Commands: Follows one step commands inconsistently;Follows one step commands with increased time     Problem Solving: Slow processing;Difficulty sequencing;Requires verbal cues;Requires tactile cues        Exercises General Exercises - Lower Extremity Ankle Circles/Pumps: AROM;Both;10 reps Long Arc Quad: AROM;Both;10 reps Heel Slides: AAROM;Both;10 reps Other Exercises Other Exercises: Sit to stand in [place] 5x2, with rest breaks, and supervision    General Comments General comments (skin integrity, edema, etc.): Pt on 3 LPM O2.  Unable to get pulse ox reading - DOE was 2/4      Pertinent Vitals/Pain Pain Assessment: No/denies pain    Home Living                      Prior  Function            PT Goals (current goals can now be found in the care plan section) Progress towards PT goals: Progressing toward goals    Frequency    Min 2X/week      PT Plan Current plan remains appropriate    Co-evaluation              AM-PAC PT "6 Clicks" Mobility   Outcome Measure  Help needed turning from your back to your side while in a flat bed without using bedrails?: A Lot Help needed moving from lying on your back to sitting on the side of a flat bed without using bedrails?: Total Help needed moving to and from a bed to  a chair (including a wheelchair)?: Total Help needed standing up from a chair using your arms (e.g., wheelchair or bedside chair)?: Total Help needed to walk in hospital room?: Total Help needed climbing 3-5 steps with a railing? : Total 6 Click Score: 7    End of Session Equipment Utilized During Treatment: Gait belt;Oxygen Activity Tolerance: Patient limited by fatigue Patient left: in chair;with call bell/phone within reach;with chair alarm set Nurse Communication: Mobility status;Need for lift equipment(white board communication) PT Visit Diagnosis: Unsteadiness on feet (R26.81);Muscle weakness (generalized) (M62.81);Difficulty in walking, not elsewhere classified (R26.2)     Time: [id number] PT Time Calculation (min) (ACUTE ONLY): 24 min  Charges:  $Therapeutic Exercise: 8-22 mins $Therapeutic Activity: 8-22 mins                     Maggie Font, PT Acute Rehab Services Pager [phone] [facility] [phone] [facility] [phone]    Karlton Lemon 09/03/2019, 2:53 PM

## 2019-09-03 NOTE — Progress Notes (Signed)
PHARMACY NOTE:  ANTIMICROBIAL RENAL DOSAGE ADJUSTMENT  Current antimicrobial regimen includes a mismatch between antimicrobial dosage and estimated renal function.  As per policy approved by the Pharmacy & Therapeutics and Medical Executive Committees, the antimicrobial dosage will be adjusted accordingly.  Current antimicrobial dosage:  Ciprofloxacin 500 mg bid  Indication: UTI  Renal Function:  Estimated Creatinine Clearance: 15.3 mL/min (A) (by C-G formula based on SCr of 1.95 mg/dL (H)). []      On intermittent HD, scheduled: []      On CRRT    Antimicrobial dosage has been changed to:  Cipro 500 mg daily  Additional comments:   Thank you for allowing pharmacy to be a part of this patient's care.  Napoleon Form, [facility] 09/03/2019 3:12 PM

## 2019-09-03 NOTE — Consult Note (Signed)
[facility]--The Home-Based Palliative Care Division of [facility]--Appreciate the referral on pt with new cancer dx.  Spoke by phone today with dtr [name] [phone].  We reviewed the CC services available to pt as a [employer] member.  [name] confirms that pt's immediate d/c plan is for rehab at [facility] followed by transition back home.  Tentative plan made to begin engagement with pt when she returns home through the [facility] program.  We look forward to serving this pt/family in the future.  Thank you.  Wynetta Fines, RN [phone]

## 2019-09-03 NOTE — Progress Notes (Signed)
PROGRESS NOTE  [name] [id number] DOB: February 14, 1925 DOA: 08/23/2019 PCP: Scheryl Marten, PA  HPI/Recap of past 71 hours: 86 year old female with history of CAD status post CABG, ICM, chronic systolic CHF with EF of 30 to 35% in 2015, chronic kidney disease stage III, hypertension, hyperlipidemia, hypothyroidism, VSD status post patch repair, diabetes mellitus type 2, anemia of chronic disease, history of thymoma status post resection and radiation presented with generalized weakness and decreased p.o. intake.  She has been following with urology as an outpatient and had plans for cystoscopy/ureteroscopy on 08/27/2019.  Preop labs on 08/21/2019 showed worsening renal function with creatinine of 2.58 compared to baseline of 1.4 along with hyponatremia of 126.  Patient was admitted for the same.  She had cystoscopy and ureteroscopy which showed probable right renal pelvis cancer and patient underwent right ureteral stent placement by urology.  Urology recommended that patient is not a surgical candidate and recommended outpatient follow-up with urology.  Palliative care also was consulted.  Currently awaiting SNF placement.  09/03/19: Responded well to IV fluid.  A little more interactive today.  Low urine output noted.  We will continue IV fluid for now.  Leukocytosis slowly trending down.  We will continue to follow UA and urine culture.     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 Tresa Moore Plan to follow-up with urology outpatient and to follow-up with oncology Dr. Alen Blew Palliative care team has been consulted to assist with establishing goals of care. Continue management as directed by urology Will follow up with oncology and urology outpatient  Leukocytosis/neutrophilia Repeat UA/Ucx and obtain blood cx peripherally x 2, continue to follow results.   Off abx, completed recently course of IV abx for E-coli/Klebs UTI on 08/30/19. Monitor off abx, leukocytosis slowly trending down, afebrile overnight. Lactic acid and procalcitonin negative on 09/02/2019  Worsening AKI on CKD 3 likely secondary to obstructive malignancy Baseline creatinine appears to be 1.4 with GFR of 32 Presented with creatinine greater than 2.5 Cr trending upward 1.87>> 1.9 Continue LR 50cc/hr Monitor volume status on IV fluid Continue to avoid nephrotoxins, dehydration and hypotension  Hypovolemic hyponatremia, resolved. Serum sodium 141  Hypomagnesemia, resolved Mg2+ 2.1 Repleted intravenously  Klebsiella/E. coli UTI Completed course of antibiotics  Chronic systolic CHF Hypovolemic on exam Continue to closely monitor on IV fluid Continue strict I's and O's and daily weight Continue cardiac medications  Hypothyroidism-continue Synthroid  Anemia of chronic disease in the setting of CKD Hemoglobin stable 8.9 from 8.9 No overt bleeding  DM2 A1c 6.6 Continue ISS Avoid hypoglycemia  HLD C/w statin  Physical debility PT OT rec SNF TOC assisting with placement  DVT prophylaxis: Heparin sq TID Code Status: DNR Family Communication: will update family  Disposition Plan:  Patient is from home.  Anticipate discharge to SNF in the next 24 to 48 hours once active infective process is ruled  out.  Barrier to discharge: Leukocytosis, UA and Urine culture  pending.  Consultants: Urology/palliative care/ curb-sided with oncology Dr. Alen Blew 3/29.  Procedures: Cystoscopy/ureteroscopy and right-sided stent placement on 08/27/2019 by urology  Antimicrobials:  Rocephin from 08/24/2019-08/27/2019 Keflex from 08/28/2019-08/30/2019     Objective: Vitals:   09/02/19 0546 09/02/19 1343 09/02/19 2150 09/03/19 0548  BP: (!) 170/58 (!) 136/53 (!) 123/100 (!) 150/54  Pulse: 79 68 68 75  Resp: 16 17 14 14   Temp: 98 F (36.7 C) 97.7 F (36.5 C) 98.9 F (37.2 C) 98.6 F (37 C)  TempSrc: Oral Oral Oral Oral  SpO2: 98% 98% 97% (!) 89%  Weight:      Height:        Intake/Output Summary (Last 24 hours) at 09/03/2019 1009 Last data filed at 09/03/2019 0108 Gross per 24 hour  Intake --  Output 280 ml  Net -280 ml   Filed Weights   08/31/19 0657 09/01/19 0615 09/02/19 0500  Weight: 64.6 kg 66.1 kg 63.8 kg    Exam:  . General: 86 y.o. year-old female frail, alert in no acute distress. . Cardiovascular: Regular rate and rhythm no rubs or gallops.   Marland Kitchen Respiratory: Clear to auscultation no wheezes no rales.   . Abdomen: Soft bowel sounds present.  Nontender.   . Musculoskeletal: No lower extremity edema bilaterally.   Marland Kitchen Psychiatry: Mood is appropriate for condition and setting.    Data Reviewed: CBC: Recent Labs  Lab 08/28/19 0510 08/30/19 0521 08/31/19 0531 09/02/19 0635 09/03/19 0529  WBC 13.6* 11.5* 13.2* 14.8* 13.7*  NEUTROABS 12.2* 9.7* 10.9* 12.6* 11.1*  HGB 8.8* 8.5* 8.3* 8.9* 8.9*  HCT 28.2* 26.2* 26.8* 28.9* 28.8*  MCV 85.5 83.2 85.1 85.8 87.0  PLT 334 265 271 329 [id number]   Basic Metabolic Panel: Recent Labs  Lab 08/28/19 0510 08/28/19 0510 08/29/19 0548 08/30/19 0521 08/31/19 0531 09/02/19 0635 09/03/19 0529  NA 135   < > 136 136 138 138 141  K 4.8   < > 4.6 3.8 4.0 4.1 4.3  CL 108   < > 111 102 103 103 106  CO2 17*   < > 16* 24 25 27 26   GLUCOSE 145*   < > 136* 206* 136* 173* 163*  BUN 33*   < > 45* 42* 36* 41*  44*  CREATININE 1.92*   < > 1.96* 1.77* 1.74* 1.87* 1.95*  CALCIUM 9.4   < > 9.7 9.0 9.2 9.4 9.6  MG 1.6*  --  1.8 1.8 1.7 2.1  --   PHOS  --   --   --   --   --  3.1  --    < > = values in this interval not displayed.   GFR: Estimated Creatinine Clearance: 15.3 mL/min (A) (by C-G formula based on SCr of 1.95 mg/dL (H)). Liver Function Tests: Recent Labs  Lab 09/02/19 0635  AST 16  ALT 16  ALKPHOS 116  BILITOT 0.6  PROT 5.9*  ALBUMIN 2.6*   No results for input(s): LIPASE, AMYLASE in the last 168 hours. No results for input(s): AMMONIA in the last 168 hours. Coagulation Profile: No results for input(s): INR, PROTIME in the last 168 hours. Cardiac Enzymes: No results for input(s): CKTOTAL, CKMB, CKMBINDEX, TROPONINI in the last 168 hours. BNP (last 3 results) No results for input(s): PROBNP in the last 8760 hours. HbA1C: No results for input(s): HGBA1C in the last 72 hours. CBG: Recent Labs  Lab 08/27/19 1146 08/28/19  2120 08/29/19 2206  GLUCAP 142* 140* 217*   Lipid Profile: No results for input(s): CHOL, HDL, LDLCALC, TRIG, CHOLHDL, LDLDIRECT in the last 72 hours. Thyroid Function Tests: No results for input(s): TSH, T4TOTAL, FREET4, T3FREE, THYROIDAB in the last 72 hours. Anemia Panel: No results for input(s): VITAMINB12, FOLATE, FERRITIN, TIBC, IRON, RETICCTPCT in the last 72 hours. Urine analysis:    Component Value Date/Time   COLORURINE YELLOW 08/23/2019 2102   APPEARANCEUR HAZY (A) 08/23/2019 2102   LABSPEC 1.012 08/23/2019 2102   PHURINE 5.0 08/23/2019 2102   GLUCOSEU NEGATIVE 08/23/2019 2102   HGBUR SMALL (A) 08/23/2019 2102   BILIRUBINUR NEGATIVE 08/23/2019 2102   KETONESUR 5 (A) 08/23/2019 2102   PROTEINUR NEGATIVE 08/23/2019 2102   UROBILINOGEN 1.0 07/19/2011 1755   NITRITE NEGATIVE 08/23/2019 2102   LEUKOCYTESUR MODERATE (A) 08/23/2019 2102   Sepsis Labs: @LABRCNTIP (procalcitonin:4,lacticidven:4)  ) Recent Results (from the past 240  hour(s))  Urine Culture     Status: Abnormal   Collection Time: 08/24/19 11:16 AM   Specimen: Urine, Clean Catch  Result Value Ref Range Status   Specimen Description   Final    URINE, CLEAN CATCH Performed at [facility], [place] [address], [place], [place] [address]    Special Requests   Final    NONE Performed at [facility] [place] [address] [place], [place] [address]    Culture (A)  Final    >=100,000 COLONIES/mL KLEBSIELLA PNEUMONIAE >=100,000 COLONIES/mL ESCHERICHIA COLI    Report Status 08/27/2019 FINAL  Final   Organism ID, Bacteria KLEBSIELLA PNEUMONIAE (A)  Final   Organism ID, Bacteria ESCHERICHIA COLI (A)  Final      Susceptibility   Escherichia coli - MIC*    AMPICILLIN 8 SENSITIVE Sensitive     CEFAZOLIN <=4 SENSITIVE Sensitive     CEFTRIAXONE <=0.25 SENSITIVE Sensitive     CIPROFLOXACIN <=0.25 SENSITIVE Sensitive     GENTAMICIN >=16 RESISTANT Resistant     IMIPENEM <=0.25 SENSITIVE Sensitive     NITROFURANTOIN <=16 SENSITIVE Sensitive     TRIMETH/SULFA <=20 SENSITIVE Sensitive     AMPICILLIN/SULBACTAM 4 SENSITIVE Sensitive     PIP/TAZO <=4 SENSITIVE Sensitive     * >=100,000 COLONIES/mL ESCHERICHIA COLI   Klebsiella pneumoniae - MIC*    AMPICILLIN >=32 RESISTANT Resistant     CEFAZOLIN <=4 SENSITIVE Sensitive     CEFTRIAXONE <=0.25 SENSITIVE Sensitive     CIPROFLOXACIN <=0.25 SENSITIVE Sensitive     GENTAMICIN <=1 SENSITIVE Sensitive     IMIPENEM <=0.25 SENSITIVE Sensitive     NITROFURANTOIN 64 INTERMEDIATE Intermediate     TRIMETH/SULFA <=20 SENSITIVE Sensitive     AMPICILLIN/SULBACTAM 8 SENSITIVE Sensitive     PIP/TAZO <=4 SENSITIVE Sensitive     * >=100,000 COLONIES/mL KLEBSIELLA PNEUMONIAE  Surgical pcr screen     Status: None   Collection Time: 08/26/19 10:42 PM   Specimen: Nasal Mucosa; Nasal Swab  Result Value Ref Range Status   MRSA, PCR NEGATIVE NEGATIVE Final   Staphylococcus aureus NEGATIVE NEGATIVE Final     Comment: (NOTE) The Xpert SA Assay (FDA approved for NASAL specimens in patients 37 years of age and older), is one component of a comprehensive surveillance program. It is not intended to diagnose infection nor to guide or monitor treatment. Performed at [facility], [place] [address] [place] [address], [place], [place] [address]   SARS CORONAVIRUS 2 (TAT 6-24 HRS) Nasopharyngeal Nasopharyngeal Swab     Status: None  Collection Time: 09/01/19 10:04 AM   Specimen: Nasopharyngeal Swab  Result Value Ref Range Status   SARS Coronavirus 2 NEGATIVE NEGATIVE Final    Comment: (NOTE) SARS-CoV-2 target nucleic acids are NOT DETECTED. The SARS-CoV-2 RNA is generally detectable in upper and lower respiratory specimens during the acute phase of infection. Negative results do not preclude SARS-CoV-2 infection, do not rule out co-infections with other pathogens, and should not be used as the sole basis for treatment or other patient management decisions. Negative results must be combined with clinical observations, patient history, and epidemiological information. The expected result is Negative. Fact Sheet for Patients: SugarRoll.be Fact Sheet for Healthcare Providers: https://www.woods-mathews.com/ This test is not yet approved or cleared by the [place] FDA and  has been authorized for detection and/or diagnosis of SARS-CoV-2 by FDA under an Emergency Use Authorization (EUA). This EUA will remain  in effect (meaning this test can be used) for the duration of the COVID-19 declaration under Section 56 4(b)(1) of the Act, 21 U.S.C. section 360bbb-3(b)(1), unless the authorization is terminated or revoked sooner. Performed at [facility], [place] [address] [place], [place] [address]   Culture, blood (routine x 2)     Status: None (Preliminary result)   Collection Time: 09/02/19 10:04 AM   Specimen: BLOOD  Result Value Ref Range Status    Specimen Description   Final    BLOOD LEFT ARM Performed at [place] [address], [place], [place] [address]    Special Requests   Final    BOTTLES DRAWN AEROBIC ONLY Blood Culture results may not be optimal due to an inadequate volume of blood received in culture bottles Performed at [place] [address] [place], [place] [address]    Culture   Final    NO GROWTH < 12 HOURS Performed at [place] [address], [place], [place] [address]    Report Status PENDING  Incomplete  Culture, blood (routine x 2)     Status: None (Preliminary result)   Collection Time: 09/02/19 10:07 AM   Specimen: BLOOD  Result Value Ref Range Status   Specimen Description   Final    BLOOD RIGHT ANTECUBITAL Performed at [place] [address], [place], [place] [address]    Special Requests   Final    BOTTLES DRAWN AEROBIC AND ANAEROBIC Blood Culture adequate volume Performed at [place] [address], [place], [place] [address]    Culture   Final    NO GROWTH < 12 HOURS Performed at [place] [address] [place], [place] [address]    Report Status PENDING  Incomplete      Studies: No results found.  Scheduled Meds: . carvedilol  6.25 mg Oral BID WC  . feeding supplement (ENSURE ENLIVE)  237 mL Oral TID BM  . heparin  5,000 Units Subcutaneous Q8H  . levothyroxine  25 mcg Oral QAC breakfast  . mouth rinse  15 mL Mouth Rinse BID  . mirtazapine  15 mg Oral QHS  . polyethylene glycol  17 g Oral Daily  . senna-docusate  1 tablet Oral Daily  . simvastatin  40 mg Oral QPM  . sodium chloride flush  3 mL Intravenous Once    Continuous Infusions: . lactated ringers       LOS: 10 days     Kayleen Memos, MD [facility] Pager [phone]  If 7PM-7AM, please  contact night-coverage [email or web address] Password [facility] 09/03/2019, 10:09 AM

## 2019-09-04 ENCOUNTER — Inpatient Hospital Stay (HOSPITAL_COMMUNITY): Payer: Medicare HMO

## 2019-09-04 ENCOUNTER — Encounter (HOSPITAL_COMMUNITY): Payer: Self-pay | Admitting: Internal Medicine

## 2019-09-04 LAB — CBC WITH DIFFERENTIAL/PLATELET
Abs Immature Granulocytes: 0.13 10*3/uL — ABNORMAL HIGH (ref 0.00–0.07)
Basophils Absolute: 0.1 10*3/uL (ref 0.0–0.1)
Basophils Relative: 1 %
Eosinophils Absolute: 0.4 10*3/uL (ref 0.0–0.5)
Eosinophils Relative: 3 %
HCT: 28 % — ABNORMAL LOW (ref 36.0–46.0)
Hemoglobin: 8.5 g/dL — ABNORMAL LOW (ref 12.0–15.0)
Immature Granulocytes: 1 %
Lymphocytes Relative: 4 %
Lymphs Abs: 0.6 10*3/uL — ABNORMAL LOW (ref 0.7–4.0)
MCH: 26.4 pg (ref 26.0–34.0)
MCHC: 30.4 g/dL (ref 30.0–36.0)
MCV: 87 fL (ref 80.0–100.0)
Monocytes Absolute: 0.9 10*3/uL (ref 0.1–1.0)
Monocytes Relative: 7 %
Neutro Abs: 11.9 10*3/uL — ABNORMAL HIGH (ref 1.7–7.7)
Neutrophils Relative %: 84 %
Platelets: 320 10*3/uL (ref 150–400)
RBC: 3.22 MIL/uL — ABNORMAL LOW (ref 3.87–5.11)
RDW: 15.5 % (ref 11.5–15.5)
WBC: 14.1 10*3/uL — ABNORMAL HIGH (ref 4.0–10.5)
nRBC: 0 % (ref 0.0–0.2)

## 2019-09-04 LAB — URINE CULTURE: Culture: 30000 — AB

## 2019-09-04 LAB — BASIC METABOLIC PANEL
Anion gap: 8 (ref 5–15)
BUN: 48 mg/dL — ABNORMAL HIGH (ref 8–23)
CO2: 26 mmol/L (ref 22–32)
Calcium: 9.4 mg/dL (ref 8.9–10.3)
Chloride: 107 mmol/L (ref 98–111)
Creatinine, Ser: 1.95 mg/dL — ABNORMAL HIGH (ref 0.44–1.00)
GFR calc Af Amer: 25 mL/min — ABNORMAL LOW (ref >60)
GFR calc non Af Amer: 22 mL/min — ABNORMAL LOW (ref >60)
Glucose, Bld: 206 mg/dL — ABNORMAL HIGH (ref 70–99)
Potassium: 4.3 mmol/L (ref 3.5–5.1)
Sodium: 141 mmol/L (ref 135–145)

## 2019-09-04 NOTE — Progress Notes (Signed)
PROGRESS NOTE  [name] [id number] DOB: [birth year] DOA: 08/23/2019 PCP: Scheryl Marten, PA  HPI/Recap of past 62 hours: 86 year old female with history of CAD status post CABG, ICM, chronic systolic CHF with EF of 30 to 35% in 2013, chronic kidney disease stage III, hypertension, hyperlipidemia, hypothyroidism, VSD status post patch repair, diabetes mellitus type 2, anemia of chronic disease, history of thymoma status post resection and radiation presented from home with generalized weakness and decreased p.o. intake.  She has been following with urology as an outpatient and had plans for cystoscopy/ureteroscopy on 08/27/2019.  Preop labs on 08/21/2019 showed worsening renal function with creatinine of 2.58 compared to baseline of 1.4 along with hyponatremia of 126.  Patient was admitted for the same.  She had cystoscopy and ureteroscopy with ureteral stent placement.   Biopsies taken later revealed detached small fragments of high grade papillary urothelial carcinoma.  Had a CT renal stone study done which showed:  severe right hydronephrosis and perinephric stranding, R renal mass, Cholelithiasis, Small right pleural effusion, Aortic atherosclerosis.  Was treated with IV antibiotics for R pyelonephritis/ Klebsiella P-E-coli UTI.   Hospital course complicated by generalized weakness, worsening leukocytosis for which a repeated UA and UCX were done.  UCx showed 30,000 colonies of Pseudomonas A, pansensitive.  Was started on po Ciprofloxacin on 09/03/19.  Due to worsening leukocytosis and neutrophilia, a CT scan abd pelvis was repeated on 09/04/19 which showed suspected rapidly progressing renal cancer.  Discussed findings with the patient's daughter [name] in person with her brother on the phone.  Also discussed with Dr. Tresa Moore of urology and Dr. Alen Blew of Oncology.    09/04/19:  Mainly somnolent and minimally interactive.    Assessment/Plan: Principal Problem:   Acute renal failure  superimposed on stage 3b chronic kidney disease (HCC) Active Problems:   Hypertension associated with diabetes (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 Tresa Moore Dr. Alen Blew will see in consultation on 09/05/19 Repeated CT abd/pelv wo contrast showed suspected rapidly progressing renal cancer Discussed findings with Dr. Tresa Moore and Dr. Alen Blew Updated the patient's daughter in person and the patient's son via phone 4/1. Palliative care team following to assist with establishing goals of care. Poor prognosis, appears to be rapidly progressing, likely less than 3 months survival  B/L pleural effusions/Possible loculation R pleural effusion, unclear etiology Personally reviewed CT Continue to maintain O2 sats >90% Currently 96% 2L  Leukocytosis/neutrophilia, R pyelonephritis? Recently completed course of IV abx Ucx 3/30 showed 30,000 colonies of Pseudomonas A, pansensitive.  Was started on po Ciprofloxacin on 09/03/19. WBC 14K, neutrophil 11.9K Monitor fever curve and WBC  AKI on CKD 3 likely secondary to obstructive malignancy Baseline creatinine appears to be 1.4 with GFR of 32 Presented with creatinine greater than 2.5 Cr trending upward 1.87>> 1.9 Continue to avoid nephrotoxins, dehydration and hypotension  Resolved Hypovolemic hyponatremia Serum sodium 141  Resolved Hypomagnesemia Mg2+ 2.1  Treated Klebsiella/E. coli UTI Completed course of antibiotics  Chronic systolic CHF Continue strict  I's  and O's and daily weight Continue cardiac medications  Hypothyroidism Continue Synthroid  Anemia of chronic disease in the setting of CKD Hemoglobin 8.5 from 8.9, MCV 87 Baseline Hg 10  Type 2 DM with hyperglycemia A1c 6.6 on 08/23/19 Continue ISS  HLD C/w statin  Physical debility PT OT rec SNF TOC assisting with placement  Goals of care Palliative following  DNR Poor prognosis, likely less than 3 months survival in the setting of an aggressive progression of renal papillary carcinoma with bones metastasis.  DVT prophylaxis: Heparin sq TID Code Status: DNR Family Communication: Updated the son and daughter on 09/04/19.  Disposition Plan:  Patient is from home.  Barrier to discharge: worsening leukocytosis, progression of her renal cancer, goals of care discussion.  Consultants: Urology/palliative care/ Oncology  Procedures: Cystoscopy/ureteroscopy and right-sided stent placement on 08/27/2019 by urology  Antimicrobials:  Rocephin from 08/24/2019-08/27/2019 Keflex from 08/28/2019-08/30/2019 Cipro 09/03/2019>>     Objective: Vitals:   09/03/19 0548 09/03/19 1458 09/03/19 2137 09/04/19 0540  BP: (!) 150/54 (!) 139/41 (!) 133/55 (!) 131/50  Pulse: 75 74 84 74  Resp: 14 15 14 14   Temp: 98.6 F (37 C) 97.6 F (36.4 C) 97.8 F (36.6 C) 97.9 F (36.6 C)  TempSrc: Oral Oral Oral Oral  SpO2: (!) 89% 99% 97% 96%  Weight:      Height:        Intake/Output Summary (Last 24 hours) at 09/04/2019 1131 Last data filed at 09/03/2019 1651 Gross per 24 hour  Intake 240 ml  Output --  Net 240 ml   Filed Weights   08/31/19 0657 09/01/19 0615 09/02/19 0500  Weight: 64.6 kg 66.1 kg 63.8 kg    Exam:  . General: 86 y.o. year-old female Frail in no acute distress.  Minimally interactive. . Cardiovascular: Regular rate and rhythm no rubs or gallops.   Marland Kitchen Respiratory: Rales at bases.  Poor inspiratory effort. . Abdomen: Soft nontender bowel sounds present.    . Musculoskeletal: No lower extremity edema bilaterally. Marland Kitchen Psychiatry: Unable to assess mood due to somnolence and minimal interaction.  Data Reviewed: CBC: Recent Labs  Lab 08/30/19 0521 08/31/19 0531 09/02/19 0635 09/03/19 0529 09/04/19 0454  WBC 11.5* 13.2* 14.8* 13.7* 14.1*  NEUTROABS 9.7* 10.9* 12.6* 11.1* 11.9*  HGB 8.5* 8.3* 8.9* 8.9* 8.5*  HCT 26.2* 26.8* 28.9* 28.8* 28.0*  MCV 83.2 85.1 85.8 87.0 87.0  PLT 265 271 329 331 [id number]   Basic Metabolic Panel: Recent Labs  Lab 08/29/19 0548 08/29/19 0548 08/30/19 0521 08/31/19 0531 09/02/19 0635 09/03/19 0529 09/04/19 0454  NA 136   < > 136 138 138 141 141  K 4.6   < > 3.8 4.0 4.1 4.3 4.3  CL 111   < > 102 103 103 106 107  CO2 16*   < > 24 25 27 26 26   GLUCOSE 136*   < > 206* 136* 173* 163* 206*  BUN 45*   < > 42* 36* 41* 44* 48*  CREATININE 1.96*   < > 1.77* 1.74* 1.87* 1.95* 1.95*  CALCIUM 9.7   < > 9.0 9.2 9.4 9.6 9.4  MG 1.8  --  1.8 1.7 2.1  --   --   PHOS  --   --   --   --  3.1  --   --    < > = values in this interval not displayed.   GFR: Estimated Creatinine Clearance: 15.3 mL/min (A) (by C-G formula based on  SCr of 1.95 mg/dL (H)). Liver Function Tests: Recent Labs  Lab 09/02/19 0635  AST 16  ALT 16  ALKPHOS 116  BILITOT 0.6  PROT 5.9*  ALBUMIN 2.6*   No results for input(s): LIPASE, AMYLASE in the last 168 hours. No results for input(s): AMMONIA in the last 168 hours. Coagulation Profile: No results for input(s): INR, PROTIME in the last 168 hours. Cardiac Enzymes: No results for input(s): CKTOTAL, CKMB, CKMBINDEX, TROPONINI in the last 168 hours. BNP (last 3 results) No results for input(s): PROBNP in the last 8760 hours. HbA1C: No results for input(s): HGBA1C in the last 72 hours. CBG: Recent Labs  Lab 08/28/19 2120 08/29/19 2206  GLUCAP 140* 217*   Lipid Profile: No results for input(s): CHOL, HDL, LDLCALC, TRIG, CHOLHDL, LDLDIRECT in the last 72 hours. Thyroid Function  Tests: No results for input(s): TSH, T4TOTAL, FREET4, T3FREE, THYROIDAB in the last 72 hours. Anemia Panel: No results for input(s): VITAMINB12, FOLATE, FERRITIN, TIBC, IRON, RETICCTPCT in the last 72 hours. Urine analysis:    Component Value Date/Time   COLORURINE AMBER (A) 09/03/2019 1430   APPEARANCEUR CLOUDY (A) 09/03/2019 1430   LABSPEC 1.015 09/03/2019 1430   PHURINE 8.0 09/03/2019 1430   GLUCOSEU NEGATIVE 09/03/2019 1430   HGBUR LARGE (A) 09/03/2019 1430   BILIRUBINUR NEGATIVE 09/03/2019 1430   KETONESUR NEGATIVE 09/03/2019 1430   PROTEINUR 100 (A) 09/03/2019 1430   UROBILINOGEN 1.0 07/19/2011 1755   NITRITE NEGATIVE 09/03/2019 1430   LEUKOCYTESUR TRACE (A) 09/03/2019 1430   Sepsis Labs: @LABRCNTIP (procalcitonin:4,lacticidven:4)  ) Recent Results (from the past 240 hour(s))  Surgical pcr screen     Status: None   Collection Time: 08/26/19 10:42 PM   Specimen: Nasal Mucosa; Nasal Swab  Result Value Ref Range Status   MRSA, PCR NEGATIVE NEGATIVE Final   Staphylococcus aureus NEGATIVE NEGATIVE Final    Comment: (NOTE) The Xpert SA Assay (FDA approved for NASAL specimens in patients 31 years of age and older), is one component of a comprehensive surveillance program. It is not intended to diagnose infection nor to guide or monitor treatment. Performed at [facility], [place] [address], [place], [place] [address]   SARS CORONAVIRUS 2 (TAT 6-24 HRS) Nasopharyngeal Nasopharyngeal Swab     Status: None   Collection Time: 09/01/19 10:04 AM   Specimen: Nasopharyngeal Swab  Result Value Ref Range Status   SARS Coronavirus 2 NEGATIVE NEGATIVE Final    Comment: (NOTE) SARS-CoV-2 target nucleic acids are NOT DETECTED. The SARS-CoV-2 RNA is generally detectable in upper and lower respiratory specimens during the acute phase of infection. Negative results do not preclude SARS-CoV-2 infection, do not rule out co-infections with other pathogens, and should not  be used as the sole basis for treatment or other patient management decisions. Negative results must be combined with clinical observations, patient history, and epidemiological information. The expected result is Negative. Fact Sheet for Patients: SugarRoll.be Fact Sheet for Healthcare Providers: https://www.woods-mathews.com/ This test is not yet approved or cleared by the [place] FDA and  has been authorized for detection and/or diagnosis of SARS-CoV-2 by FDA under an Emergency Use Authorization (EUA). This EUA will remain  in effect (meaning this test can be used) for the duration of the COVID-19 declaration under Section 56 4(b)(1) of the Act, 21 U.S.C. section 360bbb-3(b)(1), unless the authorization is terminated or revoked sooner. Performed at [facility], [place] [address], [place], [place] [address]   Culture, blood (routine x 2)  Status: None (Preliminary result)   Collection Time: 09/02/19 10:04 AM   Specimen: BLOOD  Result Value Ref Range Status   Specimen Description   Final    BLOOD LEFT ARM Performed at [facility] [address], [place], [place] [address]    Special Requests   Final    BOTTLES DRAWN AEROBIC ONLY Blood Culture results may not be optimal due to an inadequate volume of blood received in culture bottles Performed at [facility] [address] [place], [place] [address]    Culture   Final    NO GROWTH 2 DAYS Performed at [facility] [address] [place], [place] [address]    Report Status PENDING  Incomplete  Culture, blood (routine x 2)     Status: None (Preliminary result)   Collection Time: 09/02/19 10:07 AM   Specimen: BLOOD  Result Value Ref Range Status   Specimen Description   Final    BLOOD RIGHT ANTECUBITAL Performed at [facility] [address], [place], [place] [address]    Special Requests   Final     BOTTLES DRAWN AEROBIC AND ANAEROBIC Blood Culture adequate volume Performed at [facility] [address], [place], [place] [address]    Culture   Final    NO GROWTH 2 DAYS Performed at [facility] [address] [place], [place] [address]    Report Status PENDING  Incomplete  Culture, Urine     Status: Abnormal   Collection Time: 09/02/19  4:33 PM   Specimen: Urine, Clean Catch  Result Value Ref Range Status   Specimen Description   Final    URINE, CLEAN CATCH Performed at [facility], [place] [address] [place],  [address]    Special Requests   Final    NONE Performed at [facility], [place] [address] [place], [place] [address]    Culture 30,000 COLONIES/mL PSEUDOMONAS AERUGINOSA (A)  Final   Report Status 09/04/2019 FINAL  Final   Organism ID, Bacteria PSEUDOMONAS AERUGINOSA (A)  Final      Susceptibility   Pseudomonas aeruginosa - MIC*    CEFTAZIDIME 2 SENSITIVE Sensitive     CIPROFLOXACIN <=0.25 SENSITIVE Sensitive     GENTAMICIN <=1 SENSITIVE Sensitive     IMIPENEM 2 SENSITIVE Sensitive     PIP/TAZO <=4 SENSITIVE Sensitive     CEFEPIME 2 SENSITIVE Sensitive     * 30,000 COLONIES/mL PSEUDOMONAS AERUGINOSA      Studies: CT ABDOMEN PELVIS WO CONTRAST  Result Date: 09/04/2019 CLINICAL DATA:  Urothelial cancer of the right renal pelvis. Pyelonephritis. Stent placement. EXAM: CT ABDOMEN AND PELVIS WITHOUT CONTRAST TECHNIQUE: Multidetector CT imaging of the abdomen and pelvis was performed following the standard protocol without IV contrast. COMPARISON:  08/23/2019 FINDINGS: Despite efforts by the technologist and patient, motion artifact is present on today's exam and could not be eliminated. This reduces exam sensitivity and specificity. Lower chest: Increased size and potential loculation of the right pleural effusion. New small left pleural effusion. Abnormal fluid density in the retrosternal region on top  most images such as image 1/2, significance uncertain. There is atelectasis of most of the right lower lobe with air bronchograms. Atherosclerotic calcification of thoracic aorta branch vessels. Low-density blood pool suggests anemia. Stable calcification of the pericardium along the cardiac apex suggesting prior calcific pericarditis. Hepatobiliary: Dependent density in the gallbladder likely from small settling gallstones. Focal fatty infiltration  along the falciform ligament. Pancreas: Unremarkable Spleen: Unremarkable Adrenals/Urinary Tract: Both adrenal glands appear normal. The left kidney appears normal. Expanded right kidney with soft tissue density along the renal pelvis probably representing a combination of tumor and blood products given the increase in prominence over the last 12 days. A right ureteral stent is present proximal loop formed in the right kidney upper pole collecting system and distal loop in the urinary bladder. Suspected 0.4 cm right kidney lower pole nonobstructive renal calculus on image 56/2. Accentuated density in the right ureter around the stent, likely from blood products although a component of tumor is difficult to exclude. Mildly distended urinary bladder. Small amount of gas in the urinary bladder is likely from recent instrumentation. Worsening right perirenal stranding is observed. Stomach/Bowel: Stranding and indistinctness of tissue planes along the descending and proximal transverse duodenum, new inflammation in this vicinity is not excluded. Prominent stool throughout the colon favors constipation. Prominence of stool in the rectal vault with surrounding stranding in the margins of the perirectal space, early stercoral colitis is difficult to exclude. Sigmoid colon diverticulosis. Vascular/Lymphatic: Aortoiliac atherosclerotic vascular disease. Retrocaval node 1.3 cm in short axis on image 40/2, previously 0.9 cm. Right common iliac node 1.0 cm in short axis on image  61/2, previously same. Possible indistinct adenopathy in the vicinity of the bifurcation of the right common iliac artery, with the adjacent density of the right ureter noted. Tumor nodule or adenopathy adjacent to the right psoas muscle and near the external iliac vessels measures 2.4 by 1.5 cm on image 69/2, previously 2.3 by 1.3 cm. Reproductive: Presumed dystrophic calcifications along the introitus. The uterus is absent. Other: No supplemental non-categorized findings. Musculoskeletal: Expansile lytic and destructive 3.6 by 2.0 cm lesion in the right iliac bone on image 63/2, rapidly progressive given that the lesion is not easily appreciable on the prior CT of the right hip from 08/01/2019. Fracture along the inferior endplate of L1 as shown on prior lumbar spine from 08/01/2019. New lytic expansile lesion in the posterior vertebral body at T12 causing a posterior bulge in the vertebral contour on image 62/6 compatible with metastatic lesion. There is a pathologic vertical fracture extending through the inferior and superior endplates along with associated endplate compression as shown on image 63/6. Enlarging sclerotic lesion in the L3 vertebral body measuring 1.3 cm in diameter on image 63/6, previously 0.8 by 0.4 cm on 08/01/2019, favoring a metastatic lesion. Bilateral pars defects at L5 appear chronic with grade 1 anterolisthesis. Lumbar spondylosis and degenerative disc disease cause multilevel impingement in the lumbar spine. IMPRESSION: 1. Enlarging soft tissue density along the right renal pelvis and right ureter, probably a combination of tumor and blood products given the increase in prominence over the last 12 days. Superimposed pyelonephritis is not excluded and there is increase in perirenal stranding on the right. A right ureteral stent is in place. 2. Rapidly progressive osseous metastatic lesions including a lytic and destructive 3.6 cm lesion of the right iliac bone; lytic expansile lesion  posteriorly in the T12 vertebral body with associated pathologic fracture; and an enlarging sclerotic lesion in the L3 vertebral body. Stable inferior endplate fracture L1, indeterminate for malignant etiology. 3. Worsening retroperitoneal and right pelvic adenopathy. 4. New stranding and indistinctness of tissue planes along the descending and proximal transverse duodenum, potentially from duodenitis. 5. Enlarging right pleural effusion with potential loculation. New small left pleural effusion. 6. Prominent stool throughout the colon favors constipation. Prominence of stool in the  rectal vault with surrounding stranding in the margins of the perirectal space, early stercoral colitis is difficult to exclude. 7. Other imaging findings of potential clinical significance: Low-density blood pool suggests anemia. Stable calcification of the pericardium along the cardiac apex suggesting prior calcific pericarditis. 0.4 cm right kidney lower pole nonobstructive renal calculus. Cholelithiasis. Sigmoid colon diverticulosis. Lumbar spondylosis and degenerative disc disease causing multilevel impingement. Chronic bilateral pars defects L5. Aortic Atherosclerosis (ICD10-I70.0). Electronically Signed   By: Van Clines M.D.   On: 09/04/2019 08:45    Scheduled Meds: . carvedilol  6.25 mg Oral BID WC  . ciprofloxacin  500 mg Oral Daily  . feeding supplement (ENSURE ENLIVE)  237 mL Oral TID BM  . heparin  5,000 Units Subcutaneous Q8H  . levothyroxine  25 mcg Oral QAC breakfast  . mouth rinse  15 mL Mouth Rinse BID  . mirtazapine  15 mg Oral QHS  . polyethylene glycol  17 g Oral Daily  . senna-docusate  1 tablet Oral Daily  . simvastatin  40 mg Oral QPM  . sodium chloride flush  3 mL Intravenous Once    Continuous Infusions:    LOS: 11 days     Kayleen Memos, MD [facility] Pager [phone]  If 7PM-7AM, please contact night-coverage [email or web address] Password [facility] 09/04/2019, 11:31 AM

## 2019-09-04 NOTE — Care Management Important Message (Signed)
Important Message  Patient Details IM Letter given to Marney Doctor RN Case Manager to present to the Patient Name: STANLEY PARISEAU MRN: [id number] Date of Birth: May 30, 1925   Medicare Important Message Given:  Yes     Kerin Salen 09/04/2019, 10:24 AM

## 2019-09-05 ENCOUNTER — Inpatient Hospital Stay: Payer: Medicare HMO | Admitting: Oncology

## 2019-09-05 DIAGNOSIS — R531 Weakness: Secondary | ICD-10-CM

## 2019-09-05 DIAGNOSIS — Z7189 Other specified counseling: Secondary | ICD-10-CM

## 2019-09-05 DIAGNOSIS — R52 Pain, unspecified: Secondary | ICD-10-CM

## 2019-09-05 DIAGNOSIS — Z515 Encounter for palliative care: Secondary | ICD-10-CM

## 2019-09-05 DIAGNOSIS — C651 Malignant neoplasm of right renal pelvis: Principal | ICD-10-CM

## 2019-09-05 LAB — CBC WITH DIFFERENTIAL/PLATELET
Abs Immature Granulocytes: 0.18 10*3/uL — ABNORMAL HIGH (ref 0.00–0.07)
Basophils Absolute: 0.1 10*3/uL (ref 0.0–0.1)
Basophils Relative: 1 %
Eosinophils Absolute: 0.5 10*3/uL (ref 0.0–0.5)
Eosinophils Relative: 3 %
HCT: 29.2 % — ABNORMAL LOW (ref 36.0–46.0)
Hemoglobin: 9.1 g/dL — ABNORMAL LOW (ref 12.0–15.0)
Immature Granulocytes: 1 %
Lymphocytes Relative: 3 %
Lymphs Abs: 0.5 10*3/uL — ABNORMAL LOW (ref 0.7–4.0)
MCH: 27.1 pg (ref 26.0–34.0)
MCHC: 31.2 g/dL (ref 30.0–36.0)
MCV: 86.9 fL (ref 80.0–100.0)
Monocytes Absolute: 1.1 10*3/uL — ABNORMAL HIGH (ref 0.1–1.0)
Monocytes Relative: 7 %
Neutro Abs: 14 10*3/uL — ABNORMAL HIGH (ref 1.7–7.7)
Neutrophils Relative %: 85 %
Platelets: 352 10*3/uL (ref 150–400)
RBC: 3.36 MIL/uL — ABNORMAL LOW (ref 3.87–5.11)
RDW: 15.7 % — ABNORMAL HIGH (ref 11.5–15.5)
WBC: 16.3 10*3/uL — ABNORMAL HIGH (ref 4.0–10.5)
nRBC: 0 % (ref 0.0–0.2)

## 2019-09-05 LAB — GLUCOSE, CAPILLARY
Glucose-Capillary: 190 mg/dL — ABNORMAL HIGH (ref 70–99)
Glucose-Capillary: 145 mg/dL — ABNORMAL HIGH (ref 70–99)
Glucose-Capillary: 185 mg/dL — ABNORMAL HIGH (ref 70–99)

## 2019-09-05 LAB — COMPREHENSIVE METABOLIC PANEL
ALT: 13 U/L (ref 0–44)
AST: 19 U/L (ref 15–41)
Albumin: 2.7 g/dL — ABNORMAL LOW (ref 3.5–5.0)
Alkaline Phosphatase: 114 U/L (ref 38–126)
Anion gap: 11 (ref 5–15)
BUN: 52 mg/dL — ABNORMAL HIGH (ref 8–23)
CO2: 26 mmol/L (ref 22–32)
Calcium: 9.8 mg/dL (ref 8.9–10.3)
Chloride: 106 mmol/L (ref 98–111)
Creatinine, Ser: 1.93 mg/dL — ABNORMAL HIGH (ref 0.44–1.00)
GFR calc Af Amer: 26 mL/min — ABNORMAL LOW (ref >60)
GFR calc non Af Amer: 22 mL/min — ABNORMAL LOW (ref >60)
Glucose, Bld: 192 mg/dL — ABNORMAL HIGH (ref 70–99)
Potassium: 4.4 mmol/L (ref 3.5–5.1)
Sodium: 143 mmol/L (ref 135–145)
Total Bilirubin: 0.4 mg/dL (ref 0.3–1.2)
Total Protein: 6.1 g/dL — ABNORMAL LOW (ref 6.5–8.1)

## 2019-09-05 LAB — PHOSPHORUS: Phosphorus: 3.2 mg/dL (ref 2.5–4.6)

## 2019-09-05 LAB — MAGNESIUM: Magnesium: 2 mg/dL (ref 1.7–2.4)

## 2019-09-05 MED ORDER — MORPHINE SULFATE (CONCENTRATE) 10 MG/0.5ML PO SOLN
5.0000 mg | ORAL | Status: DC | PRN
  Administered 2019-09-06: 5 mg via ORAL
  Filled 2019-09-05: qty 0.5

## 2019-09-05 MED ORDER — INSULIN ASPART 100 UNIT/ML ~~LOC~~ SOLN: 0.0000 [IU] | Freq: Every day | SUBCUTANEOUS | Status: DC

## 2019-09-05 MED ORDER — CIPROFLOXACIN IN D5W 400 MG/200ML IV SOLN: 400.0000 mg | INTRAVENOUS | Status: DC

## 2019-09-05 MED ORDER — PIPERACILLIN-TAZOBACTAM IN DEX 2-0.25 GM/50ML IV SOLN
2.2500 g | Freq: Three times a day (TID) | INTRAVENOUS | Status: DC
  Administered 2019-09-05 – 2019-09-06 (×4): 2.25 g via INTRAVENOUS
  Filled 2019-09-05 (×5): qty 50

## 2019-09-05 MED ORDER — INSULIN ASPART 100 UNIT/ML ~~LOC~~ SOLN
0.0000 [IU] | Freq: Three times a day (TID) | SUBCUTANEOUS | Status: DC
  Administered 2019-09-05: 2 [IU] via SUBCUTANEOUS
  Administered 2019-09-05 – 2019-09-06 (×2): 1 [IU] via SUBCUTANEOUS
  Administered 2019-09-06: 2 [IU] via SUBCUTANEOUS

## 2019-09-05 NOTE — Progress Notes (Signed)
PROGRESS NOTE  [name] [id number] DOB: Mar 11, 1925 DOA: 08/23/2019 PCP: Scheryl Marten, PA  HPI/Recap of past 57 hours: 86 year old female with history of CAD status post CABG, ICM, chronic systolic CHF with EF of 30 to 35% in 2013, chronic kidney disease stage III, hypertension, hyperlipidemia, hypothyroidism, VSD status post patch repair, diabetes mellitus type 2, anemia of chronic disease, history of thymoma status post resection and radiation presented from home with generalized weakness and decreased p.o. intake.  She has been following with urology as an outpatient and had plans for cystoscopy/ureteroscopy on 08/27/2019.  Preop labs on 08/21/2019 showed worsening renal function with creatinine of 2.58 compared to baseline of 1.4 along with hyponatremia of 126.  Patient was admitted for the same.  She had cystoscopy and ureteroscopy with ureteral stent placement.   Biopsies taken later revealed detached small fragments of high grade papillary urothelial carcinoma.  Had a CT renal stone study done which showed:  severe right hydronephrosis and perinephric stranding, R renal mass, Cholelithiasis, Small right pleural effusion, Aortic atherosclerosis.  Was treated with IV antibiotics for R pyelonephritis/ Klebsiella P-E-coli UTI.   Hospital course complicated by generalized weakness, worsening leukocytosis for which a repeated UA and UCX were done.  UCx showed 30,000 colonies of Pseudomonas A, pansensitive.  Was started on po Ciprofloxacin on 09/03/19.  Due to worsening leukocytosis and neutrophilia, a CT scan abd pelvis was repeated on 09/04/19 which showed suspected rapidly progressing renal cancer.  Discussed findings with the patient's daughter [name] in person with her brother on the phone.  Also discussed with Dr. Tresa Moore of urology and Dr. Alen Blew of Oncology.    09/05/19:  She is minimally interactive this morning.  No specific complaints.  Leukocytosis and neutrophilia worsening.  Will stop  po cipro and start Zosyn empirically.  Blood culture x2 negative to date, continue to follow.      Assessment/Plan: Principal Problem:   Acute renal failure superimposed on stage 3b chronic kidney disease (HCC) Active Problems:   Hypertension associated with diabetes ([place])   CAD (coronary artery disease)   Chronic systolic CHF (congestive heart failure) (HCC)   Type 2 diabetes mellitus with stage 3 chronic kidney disease (HCC)   Hyperlipidemia associated with type 2 diabetes mellitus (HCC)   Normocytic anemia   Hypothyroidism   Hydronephrosis of right kidney   Hyponatremia   AKI (acute kidney injury) ([place])   Cancer of renal pelvis, right ([place])   Newly diagnosed right 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 Tresa Moore Dr. Alen Blew will see in consultation on 09/05/19 Repeated CT abd/pelv wo contrast showed suspected rapidly progressing renal cancer Discussed findings with Dr. Tresa Moore and Dr. Alen Blew Updated the patient's daughter in person and the patient's son via phone 4/1. Palliative care team following to assist with establishing goals of care. Poor prognosis, appears to be rapidly progressing, likely less than 3 months survival  Worsening Leukocytosis/neutrophilia, R pyelonephritis? Recently completed course of IV abx Ucx 3/30 showed 30,000 colonies of Pseudomonas A, pansensitive.  Was started on po Ciprofloxacin on 09/03/19, dced on 4/2 due to worsening labs.  Started Zosyn. WBC 14K>> 16K, neutrophil 11.9K>>14K Continue to Monitor fever curve and WBC  B/L pleural effusions/Possible loculation R pleural effusion, unclear etiology Personally reviewed CT Continue to maintain O2 sats >90% Currently 94% 2L  AKI on CKD 3 likely secondary to obstructive malignancy Baseline creatinine appears to be 1.4 with GFR of 32 Presented  with creatinine greater than 2.5 Cr trending upward 1.87>> 1.9 Continue to avoid  nephrotoxins, dehydration and hypotension  Resolved Hypovolemic hyponatremia Serum sodium 141> 143  Resolved Hypomagnesemia Mg2+ 2.1> 2.0  Treated Klebsiella/E. coli UTI Completed course of antibiotics  Chronic systolic CHF Continue strict I's and O's and daily weight Continue cardiac medications  Hypothyroidism Continue Synthroid  Anemia of chronic disease in the setting of CKD and malignancy Hemoglobin 9.1 from 8.5 from 8.9, MCV 87 Baseline Hg 10 No overt bleeding  Type 2 DM with hyperglycemia A1c 6.6 on 08/23/19 Continue ISS  HLD C/w statin  Physical debility PT OT rec SNF TOC assisting with placement  Goals of care Palliative following  DNR Poor prognosis, likely less than 3 months survival in the setting of an aggressive progression of renal papillary carcinoma with bones metastasis.  DVT prophylaxis: Heparin sq TID Code Status: DNR Family Communication: Updated the son and daughter on 09/04/19.  Disposition Plan:  Patient is from home.  Barrier to discharge: worsening leukocytosis, progression of her renal cancer, goals of care discussion.  Consultants: Urology/palliative care/ Oncology  Procedures: Cystoscopy/ureteroscopy and right-sided stent placement on 08/27/2019 by urology  Antimicrobials:  Rocephin from 08/24/2019-08/27/2019 Keflex from 08/28/2019-08/30/2019 Cipro 09/03/2019>>     Objective: Vitals:   09/04/19 0540 09/04/19 1339 09/04/19 2047 09/05/19 0546  BP: (!) 131/50 (!) 140/53 (!) 129/48 (!) 157/63  Pulse: 74 69 80 87  Resp: 14 14 14 14   Temp: 97.9 F (36.6 C) 97.6 F (36.4 C) 98 F (36.7 C) 98.3 F (36.8 C)  TempSrc: Oral Oral Oral Oral  SpO2: 96% 96% 98% 94%  Weight:      Height:        Intake/Output Summary (Last 24 hours) at 09/05/2019 1021 Last data filed at 09/05/2019 [id number] Gross per 24 hour  Intake 120 ml  Output 700 ml  Net -580 ml   Filed Weights   08/31/19 0657 09/01/19 0615 09/02/19 0500  Weight: 64.6 kg 66.1 kg  63.8 kg    Exam:  . General: 86 y.o. year-old female Frail in no acute distress.  Minimally interactive.   . Cardiovascular: Regular rate and rhythm no rubs or gallops.   Marland Kitchen Respiratory: Mild rales at bases no wheezing noted.  Poor inspiratory effort. . Abdomen: Soft nontender normal bowel sounds present.   . Musculoskeletal: No lower extremity edema bilaterally.   Marland Kitchen Psychiatry: Mood is appropriate for condition and setting.   Data Reviewed: CBC: Recent Labs  Lab 08/31/19 0531 09/02/19 0635 09/03/19 0529 09/04/19 0454 09/05/19 0657  WBC 13.2* 14.8* 13.7* 14.1* 16.3*  NEUTROABS 10.9* 12.6* 11.1* 11.9* 14.0*  HGB 8.3* 8.9* 8.9* 8.5* 9.1*  HCT 26.8* 28.9* 28.8* 28.0* 29.2*  MCV 85.1 85.8 87.0 87.0 86.9  PLT 271 329 331 320 [id number]   Basic Metabolic Panel: Recent Labs  Lab 08/30/19 0521 08/30/19 0521 08/31/19 0531 09/02/19 0635 09/03/19 0529 09/04/19 0454 09/05/19 0657  NA 136   < > 138 138 141 141 143  K 3.8   < > 4.0 4.1 4.3 4.3 4.4  CL 102   < > 103 103 106 107 106  CO2 24   < > 25 27 26 26 26   GLUCOSE 206*   < > 136* 173* 163* 206* 192*  BUN 42*   < > 36* 41* 44* 48* 52*  CREATININE 1.77*   < > 1.74* 1.87* 1.95* 1.95* 1.93*  CALCIUM 9.0   < > 9.2 9.4 9.6 9.4 9.8  MG 1.8  --  1.7 2.1  --   --  2.0  PHOS  --   --   --  3.1  --   --  3.2   < > = values in this interval not displayed.   GFR: Estimated Creatinine Clearance: 15.5 mL/min (A) (by C-G formula based on SCr of 1.93 mg/dL (H)). Liver Function Tests: Recent Labs  Lab 09/02/19 0635 09/05/19 0657  AST 16 19  ALT 16 13  ALKPHOS 116 114  BILITOT 0.6 0.4  PROT 5.9* 6.1*  ALBUMIN 2.6* 2.7*   No results for input(s): LIPASE, AMYLASE in the last 168 hours. No results for input(s): AMMONIA in the last 168 hours. Coagulation Profile: No results for input(s): INR, PROTIME in the last 168 hours. Cardiac Enzymes: No results for input(s): CKTOTAL, CKMB, CKMBINDEX, TROPONINI in the last 168 hours. BNP (last 3  results) No results for input(s): PROBNP in the last 8760 hours. HbA1C: No results for input(s): HGBA1C in the last 72 hours. CBG: Recent Labs  Lab 08/29/19 2206  GLUCAP 217*   Lipid Profile: No results for input(s): CHOL, HDL, LDLCALC, TRIG, CHOLHDL, LDLDIRECT in the last 72 hours. Thyroid Function Tests: No results for input(s): TSH, T4TOTAL, FREET4, T3FREE, THYROIDAB in the last 72 hours. Anemia Panel: No results for input(s): VITAMINB12, FOLATE, FERRITIN, TIBC, IRON, RETICCTPCT in the last 72 hours. Urine analysis:    Component Value Date/Time   COLORURINE AMBER (A) 09/03/2019 1430   APPEARANCEUR CLOUDY (A) 09/03/2019 1430   LABSPEC 1.015 09/03/2019 1430   PHURINE 8.0 09/03/2019 1430   GLUCOSEU NEGATIVE 09/03/2019 1430   HGBUR LARGE (A) 09/03/2019 1430   BILIRUBINUR NEGATIVE 09/03/2019 1430   KETONESUR NEGATIVE 09/03/2019 1430   PROTEINUR 100 (A) 09/03/2019 1430   UROBILINOGEN 1.0 07/19/2011 1755   NITRITE NEGATIVE 09/03/2019 1430   LEUKOCYTESUR TRACE (A) 09/03/2019 1430   Sepsis Labs: @LABRCNTIP (procalcitonin:4,lacticidven:4)  ) Recent Results (from the past 240 hour(s))  Surgical pcr screen     Status: None   Collection Time: 08/26/19 10:42 PM   Specimen: Nasal Mucosa; Nasal Swab  Result Value Ref Range Status   MRSA, PCR NEGATIVE NEGATIVE Final   Staphylococcus aureus NEGATIVE NEGATIVE Final    Comment: (NOTE) The Xpert SA Assay (FDA approved for NASAL specimens in patients 65 years of age and older), is one component of a comprehensive surveillance program. It is not intended to diagnose infection nor to guide or monitor treatment. Performed at [facility], [place] [address], [place], [place] [address]   SARS CORONAVIRUS 2 (TAT 6-24 HRS) Nasopharyngeal Nasopharyngeal Swab     Status: None   Collection Time: 09/01/19 10:04 AM   Specimen: Nasopharyngeal Swab  Result Value Ref Range Status   SARS Coronavirus 2 NEGATIVE NEGATIVE Final     Comment: (NOTE) SARS-CoV-2 target nucleic acids are NOT DETECTED. The SARS-CoV-2 RNA is generally detectable in upper and lower respiratory specimens during the acute phase of infection. Negative results do not preclude SARS-CoV-2 infection, do not rule out co-infections with other pathogens, and should not be used as the sole basis for treatment or other patient management decisions. Negative results must be combined with clinical observations, patient history, and epidemiological information. The expected result is Negative. Fact Sheet for Patients: SugarRoll.be Fact Sheet for Healthcare Providers: https://www.woods-mathews.com/ This test is not yet approved or cleared by the [place] FDA and  has been authorized for detection and/or diagnosis of SARS-CoV-2 by FDA under an Emergency  Use Authorization (EUA). This EUA will remain  in effect (meaning this test can be used) for the duration of the COVID-19 declaration under Section 56 4(b)(1) of the Act, 21 U.S.C. section 360bbb-3(b)(1), unless the authorization is terminated or revoked sooner. Performed at [facility], [place] [address] [place], [place] [address]   Culture, blood (routine x 2)     Status: None (Preliminary result)   Collection Time: 09/02/19 10:04 AM   Specimen: BLOOD  Result Value Ref Range Status   Specimen Description   Final    BLOOD LEFT ARM Performed at [facility] [address], [place], [place] [address]    Special Requests   Final    BOTTLES DRAWN AEROBIC ONLY Blood Culture results may not be optimal due to an inadequate volume of blood received in culture bottles Performed at [facility] [address] [place], [place] [address]    Culture   Final    NO GROWTH 2 DAYS Performed at [facility] [address], [place], [place] [address]    Report Status PENDING  Incomplete  Culture, blood (routine x 2)      Status: None (Preliminary result)   Collection Time: 09/02/19 10:07 AM   Specimen: BLOOD  Result Value Ref Range Status   Specimen Description   Final    BLOOD RIGHT ANTECUBITAL Performed at [facility] [address], [place], [place] [address]    Special Requests   Final    BOTTLES DRAWN AEROBIC AND ANAEROBIC Blood Culture adequate volume Performed at [facility] [address], [place], [place] [address]    Culture   Final    NO GROWTH 2 DAYS Performed at [facility] [address], [place], [place] [address]    Report Status PENDING  Incomplete  Culture, Urine     Status: Abnormal   Collection Time: 09/02/19  4:33 PM   Specimen: Urine, Clean Catch  Result Value Ref Range Status   Specimen Description   Final    URINE, CLEAN CATCH Performed at [facility], [place] [address], [place],  [address]    Special Requests   Final    NONE Performed at [facility], [place] [address] [place], [place] [address]    Culture 30,000 COLONIES/mL PSEUDOMONAS AERUGINOSA (A)  Final   Report Status 09/04/2019 FINAL  Final   Organism ID, Bacteria PSEUDOMONAS AERUGINOSA (A)  Final      Susceptibility   Pseudomonas aeruginosa - MIC*    CEFTAZIDIME 2 SENSITIVE Sensitive     CIPROFLOXACIN <=0.25 SENSITIVE Sensitive     GENTAMICIN <=1 SENSITIVE Sensitive     IMIPENEM 2 SENSITIVE Sensitive     PIP/TAZO <=4 SENSITIVE Sensitive     CEFEPIME 2 SENSITIVE Sensitive     * 30,000 COLONIES/mL PSEUDOMONAS AERUGINOSA      Studies: No results found.  Scheduled Meds: . carvedilol  6.25 mg Oral BID WC  . feeding supplement (ENSURE ENLIVE)  237 mL Oral TID BM  . heparin  5,000 Units Subcutaneous Q8H  . insulin aspart  0-5 Units Subcutaneous QHS  . insulin aspart  0-9 Units Subcutaneous TID WC  . levothyroxine  25 mcg Oral QAC breakfast  . mouth rinse  15 mL Mouth Rinse BID  . mirtazapine  15 mg Oral QHS  .  polyethylene glycol  17 g Oral Daily  . senna-docusate  1 tablet Oral  Daily  . simvastatin  40 mg Oral QPM  . sodium chloride flush  3 mL Intravenous Once    Continuous Infusions:    LOS: 12 days     Kayleen Memos, MD [facility] Pager [phone]  If 7PM-7AM, please contact night-coverage [email or web address] Password [facility] 09/05/2019, 10:21 AM

## 2019-09-05 NOTE — Progress Notes (Signed)
Physical Therapy Treatment Patient Details Name: [name] MRN: [id number] DOB: 14-Mar-1925 Today's Date: 09/05/2019    History of Present Illness [name] is a 86 y.o. female with medical history significant for CAD s/p CABG, ICM, chronic systolic CHF (EF [id number] in 2015), CKD stage III, hypertension, hyperlipidemia, hypothyroidism, VSD s/p patch repair, type 2 diabetes, anemia of chronic disease, and history of thymoma s/p resection and radiation who presents to the ED for evaluation of generalized weakness.    PT Comments    The patient is willing to get up and to recliner again today. @ assisting to sit up and stand  In STEDy. Patient incontinent of urine. Son present. Patient 's HR 73, SPo2 99% on1 l. Continue PT for progressive mobility.   Follow Up Recommendations  SNF     Equipment Recommendations  Wheelchair (measurements PT);Hospital bed;3in1 (PT);Wheelchair cushion (measurements PT)    Recommendations for Other Services       Precautions / Restrictions Precautions Precaution Comments: Monitor skin and joint integrity closely- frail and prone to skin tears and bruising., incontinent, may consider dependes    Mobility  Bed Mobility   Bed Mobility: Supine to Sit     Supine to sit: Mod assist;+2 for physical assistance;HOB elevated;+2 for safety/equipment     General bed mobility comments: use of bed  pad to scoot and slide patient to bed edge.  Transfers Overall transfer level: Needs assistance     Sit to Stand: Mod assist;+2 physical assistance;+2 safety/equipment;From elevated surface         General transfer comment: cues to reach for the front bar, mod assist to stand, patient pulling up. patinet incontinent of urine. patient stayed partila standing in STEDY x 5 minutes. Patient assisted to stand again from STEDY to sit to recliner with assist to descend.  Ambulation/Gait                 Stairs             Wheelchair Mobility     Modified Rankin (Stroke Patients Only)       Balance   Sitting-balance support: Single extremity supported;Feet supported Sitting balance-Leahy Scale: Fair                                      Cognition Arousal/Alertness: Awake/alert Behavior During Therapy: WFL for tasks assessed/performed                         Memory: Decreased short-term memory Following Commands: Follows one step commands inconsistently;Follows one step commands with increased time Safety/Judgement: Decreased awareness of safety Awareness: Emergent   General Comments: requires intermittent cues for response to questions or following commands, making needs known PRN during session -asked for water      Exercises      General Comments        Pertinent Vitals/Pain Pain Assessment: No/denies pain    Home Living                      Prior Function            PT Goals (current goals can now be found in the care plan section) Progress towards PT goals: Progressing toward goals    Frequency    Min 2X/week      PT Plan Current plan remains appropriate  Co-evaluation              AM-PAC PT "6 Clicks" Mobility   Outcome Measure  Help needed turning from your back to your side while in a flat bed without using bedrails?: A Lot Help needed moving from lying on your back to sitting on the side of a flat bed without using bedrails?: Total Help needed moving to and from a bed to a chair (including a wheelchair)?: Total Help needed standing up from a chair using your arms (e.g., wheelchair or bedside chair)?: Total Help needed to walk in hospital room?: Total Help needed climbing 3-5 steps with a railing? : Total 6 Click Score: 7    End of Session Equipment Utilized During Treatment: Gait belt;Oxygen Activity Tolerance: Patient tolerated treatment well Patient left: in chair;with call bell/phone within reach;with chair alarm set;with  family/visitor present Nurse Communication: Mobility status;Need for lift equipment PT Visit Diagnosis: Unsteadiness on feet (R26.81);Muscle weakness (generalized) (M62.81);Difficulty in walking, not elsewhere classified (R26.2)     Time: 1600-1620 PT Time Calculation (min) (ACUTE ONLY): 20 min  Charges:  $Therapeutic Activity: 8-22 mins                     Tresa Endo PT Acute Rehabilitation Services Pager [phone] Office [phone]    Claretha Cooper 09/05/2019, 5:19 PM

## 2019-09-05 NOTE — Progress Notes (Signed)
Pharmacy Antibiotic Note  [name] is a 86 y.o. female admitted on 08/23/2019 with generalized weakness and decreased PO intake. Patient has newly diagnosed urothelial carcinoma with osseous metastatic lesions. Recently completed course of antibiotics for E coli and Klebsiella UTI. Cipro started 3/31 for Pseudomonas in urine though 30 K colonies due to complicated urologic/oncologic presentation.  Pharmacy has been consulted for Zosyn dosing for worsening leukocytosis and neutrophilia.   09/05/19 10:33 AM  -SCr 1.93, CrCl ~ 15 ml/min -WBC 16.3,ANC14 K, afebrile  Plan: Zosyn 2.25 g q 8 hours -F/U renal function, culture results, plans for antibiotics   Height: 5' (152.4 cm) Weight: 63.8 kg (140 lb 10.5 oz) IBW/kg (Calculated) : 45.5  Temp (24hrs), Avg:98 F (36.7 C), Min:97.6 F (36.4 C), Max:98.3 F (36.8 C)  Recent Labs  Lab 08/31/19 0531 09/02/19 0635 09/02/19 1006 09/03/19 0529 09/04/19 0454 09/05/19 0657  WBC 13.2* 14.8*  --  13.7* 14.1* 16.3*  CREATININE 1.74* 1.87*  --  1.95* 1.95* 1.93*  LATICACIDVEN  --   --  1.3  --   --   --     Estimated Creatinine Clearance: 15.5 mL/min (A) (by C-G formula based on SCr of 1.93 mg/dL (H)).    Allergies  Allergen Reactions  . Amoxapine And Related Hives  . Clozapine Hives    3/21 Rocephin >> 3/24 3/24 Ancef >> 3/24 3/25 Keflex >> 3/27 3/31 ciprofloxacin >> 4/2 4/2 Zosyn >>  3/21 UCx: > 100k Klebsiella pneumonia, >100k E.coli 3/30 BCx: ngtd 3/30 UCx: 30k of Pseudomonas   Thank you for allowing pharmacy to be a part of this patient's care.  Ulice Dash D 09/05/2019 10:25 AM

## 2019-09-05 NOTE — Progress Notes (Signed)
Daily Progress Note   Patient Name: [name]       Date: 09/05/2019 DOB: Feb 02, 1925  Age: 86 y.o. MRN#: [id number] Attending Physician: Kayleen Memos, DO Primary Care Physician: Scheryl Marten, [place] Admit Date: 08/23/2019  Reason for Consultation/Follow-up: Establishing goals of care, Non pain symptom management and Pain control  Subjective: Ms. [name] is awake alert sitting up in bed.  Bedside nursing staff is present at her bedside, she is trying to sit up.  She appears with generalized weakness however does not appear to have acute distress.  No family at bedside, patient son Zenia Resides went home to eat lunch and will arrive back at the hospital later today.  Call placed and discussed with Antony Haste.  See below.  Length of Stay: 12  Current Medications: Scheduled Meds:  . carvedilol  6.25 mg Oral BID WC  . feeding supplement (ENSURE ENLIVE)  237 mL Oral TID BM  . heparin  5,000 Units Subcutaneous Q8H  . insulin aspart  0-5 Units Subcutaneous QHS  . insulin aspart  0-9 Units Subcutaneous TID WC  . levothyroxine  25 mcg Oral QAC breakfast  . mouth rinse  15 mL Mouth Rinse BID  . mirtazapine  15 mg Oral QHS  . polyethylene glycol  17 g Oral Daily  . senna-docusate  1 tablet Oral Daily  . simvastatin  40 mg Oral QPM  . sodium chloride flush  3 mL Intravenous Once    Continuous Infusions: . piperacillin-tazobactam      PRN Meds: acetaminophen **OR** acetaminophen, lip balm, morphine CONCENTRATE, ondansetron **OR** ondansetron (ZOFRAN) IV, traMADol  Physical Exam         Awake alert appears with generalized weakness No acute distress Appears to have regular work of breathing Abdomen is not distended Does not appear to have edema Responds and interacts appropriately  Vital  Signs: BP (!) 137/52 (BP Location: Right Arm)   Pulse 75   Temp 97.9 F (36.6 C) (Oral)   Resp 16   Ht 5' (1.524 m)   Wt 63.8 kg   SpO2 94%   BMI 27.47 kg/m  SpO2: SpO2: 94 % O2 Device: O2 Device: Nasal Cannula O2 Flow Rate: O2 Flow Rate (L/min): 2 L/min  Intake/output summary:   Intake/Output Summary (Last 24 hours) at 09/05/2019 1425 Last data  filed at 09/05/2019 [id number] Gross per 24 hour  Intake 120 ml  Output 700 ml  Net -580 ml   LBM: Last BM Date: 09/04/19(incont of bowel) Baseline Weight: Weight: 58.5 kg Most recent weight: Weight: 63.8 kg       Palliative Assessment/Data:    Flowsheet Rows     Most Recent Value  Intake Tab  Referral Department  Hospitalist  Unit at Time of Referral  Oncology Unit  Palliative Care Primary Diagnosis  Cancer  Date Notified  08/25/19  Palliative Care Type  New Palliative care  Reason for referral  Clarify Goals of Care  Date of Admission  08/23/19  Date first seen by Palliative Care  08/26/19  # of days Palliative referral response time  1 Day(s)  # of days IP prior to Palliative referral  2  Clinical Assessment  Palliative Performance Scale Score  60%  Psychosocial & Spiritual Assessment  Palliative Care Outcomes  Patient/Family meeting held?  Yes  Who was at the meeting?  Patient, daughter, son      Patient Active Problem List   Diagnosis Date Noted  . Cancer of renal pelvis, right ([place]) 08/27/2019  . AKI (acute kidney injury) ([place]) 08/24/2019  . Acute renal failure superimposed on stage 3b chronic kidney disease ([place]) 08/23/2019  . Type 2 diabetes mellitus with stage 3 chronic kidney disease ([place]) 08/23/2019  . Hyperlipidemia associated with type 2 diabetes mellitus ([place]) 08/23/2019  . Normocytic anemia 08/23/2019  . Hypothyroidism 08/23/2019  . Hydronephrosis of right kidney 08/23/2019  . Hyponatremia 08/23/2019  . Hip pain 04/22/2019  . Pain in left knee 06/07/2017  . Acute non-infective otitis externa of right ear  10/31/2016  . Impacted cerumen of both ears 12/27/2015  . Marginal perforation of tympanic membrane of right ear 12/27/2015  . Sensorineural hearing loss (SNHL), bilateral 12/27/2015  . S/P lumbar laminectomy 08/19/2015  . Ischemic cardiomyopathy   . Chronic systolic CHF (congestive heart failure) ([place]) 08/18/2011  . Pleural effusion 08/18/2011  . Cellulitis 08/18/2011  . Dyspnea 08/17/2011  . Malignant neoplasm of anterior mediastinum ([place]) 08/07/2011  . Physical deconditioning 07/19/2011  . Cancer ([place]) 07/08/2011  . Acute anterior wall MI ([place]) 07/07/2011  . Myocardial infarction ([place]) 07/07/2011  . Hypertension associated with diabetes ([place])   . Osteoarthritis of knee   . CAD (coronary artery disease)   . 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 Generalized weakness, debility and declining functional status   Recommendations/Plan:  Call placed and discussed with son.  Discussed about patient's current condition, their recent interaction with medical oncology.  Discussed about hospitalization course goals of care disposition options.  We reviewed about patient's CT scan showing enlarging soft tissue density in the right pelvis, rapidly progressive osseous metastatic burden, worsening retroperitoneal as well as right pelvic adenopathy.  We reviewed about the patient's pain and nonpain symptom management.  She is on adequate bowel regimen with Senokot and MiraLAX.  Discussed about pain management options.  Patient has Tylenol and tramadol available.  We discussed about morphine sulfate solution.  Suspect that patient will need opioid rotation or stronger dosages of opioids based on her pain management needs.  Goals of care we  discussed about continuing efforts with rehabilitation and appetite improvement.  Suspect that the patient will be discharged to skilled nursing facility for rehabilitation attempt along with palliative care following.  They are to see medical oncology in the outpatient setting in 2 weeks time.  Pilar Plate but compassionate discussions also undertaken about the rapidly progressive nature of this malignancy.  Discussed that at some point in the near future full scope of hospice services and singular focus on comfort measures might become most appropriate.  Son Zenia Resides agrees.  All of his questions addressed to the best of my ability.  He wishes to be notified when the patient is being discharged.  Code Status:    Code Status Orders  (From admission, onward)         Start     Ordered   08/23/19 2338  Do not attempt resuscitation (DNR)  Continuous    Question Answer Comment  In the event of cardiac or respiratory ARREST Do not call a "code blue"   In the event of cardiac or respiratory ARREST Do not perform Intubation, CPR, defibrillation or ACLS   In the event of cardiac or respiratory ARREST Use medication by any route, position, wound care, and other measures to relive pain and suffering. May use oxygen, suction and manual treatment of airway obstruction as needed for comfort.      08/23/19 2338        Code Status History    Date Active Date Inactive Code Status Order ID Comments User Context   04/22/2019 2045 04/24/2019 1721 DNR [id number]  Truddie Hidden, MD Inpatient   07/17/2011 1340 07/18/2011 2016 Full Code [id number]  Grace Isaac, MD Inpatient   Advance Care Planning Activity    Advance Directive Documentation     Most Recent Value  Type of Advance Directive  Healthcare Power of Attorney, Living will  Pre-existing out of facility DNR order (yellow form or pink MOST form)  --  "MOST" Form in Place?  --       Prognosis:   few months.   Discharge Planning:  [place] for rehab with Palliative care service follow-up  Care plan was discussed with patient, her son Zenia Resides on the phone.  Thank you for allowing the Palliative Medicine Team to assist in the care of this patient.   Time In: 1330 Time Out: 1405 Total Time 35 Prolonged Time Billed  no       Greater than 50%  of this time was spent counseling and coordinating care related to the above assessment and plan.  Loistine Chance, MD  Please contact Palliative Medicine Team phone at [phone] for questions and concerns.

## 2019-09-05 NOTE — Progress Notes (Signed)
9 Days Post-Op   Subjective/Chief Complaint:   1 - Rapidly Progressive + Metastatic Right Renal Pelvis / Ureteral Cancer- very large volume renal pelvis and peri-ureteral cancer by BX and stetn placement 08/2019 on eval Rt hydro and multifocal soft tissue densities in Rt ureter and renal pelvis area by non-contrast CT 08/2019 on evaluation of back pain (chornic). Some ? if hilar adenopathy. Appears 1 artery / 1 vein right renovascular anatomy. Repeat imaging 09/2019 with new osteolytic pelvic mets and increasing adenopathy c/w rapidly progressive metastatic cancer. Med Onc not felt candidate for oral systemic therapies.   2 - Recurrent Cystitis - seeral episodes per year of simple cystitis. She is diabetic with PVR's 293mL range x many. Most recent CX e. coli senes keflex, cipro, nitro, RES gent. Also some pan-sensitive psudomonas this admission, now on Cipro per CX.  3 -Stage 3 Renal Insuficiency - Cr 1.4's at bselien / GFR 30s. Lonh h/o DM2, HTN.   Today " [place] " is continuing to decline. Repeat imaging with rapidly progressive metastatic caner.    Objective: Vital signs in last 24 hours: Temp:  [97.6 F (36.4 C)-98.3 F (36.8 C)] 98.3 F (36.8 C) (04/02 0546) Pulse Rate:  [69-87] 87 (04/02 0546) Resp:  [14] 14 (04/02 0546) BP: (129-157)/(48-63) 157/63 (04/02 0546) SpO2:  [94 %-98 %] 94 % (04/02 0546) Last BM Date: 09/04/19(incont of bowel)  Intake/Output from previous day: 04/01 0701 - 04/02 0700 In: 120 [P.O.:120] Out: 700 [Urine:700] Intake/Output this shift: No intake/output data recorded.  General appearance: alert and stigmata of functional deceline, very frail but pleasant.  Eyes: negative Nose: Nares normal. Septum midline. Mucosa normal. No drainage or sinus tenderness. Throat: lips, mucosa, and tongue normal; teeth and gums normal Neck: supple, symmetrical, trachea midline Back: symmetric, no curvature. ROM normal. No CVA tenderness. Resp: non-labored on RA Cardio:  No rate GI: soft, non-tender; bowel sounds normal; no masses,  no organomegaly Extremities: extremities normal, atraumatic, no cyanosis or edema Neurologic: Grossly normal  Lab Results:  Recent Labs    09/04/19 0454 09/05/19 0657  WBC 14.1* 16.3*  HGB 8.5* 9.1*  HCT 28.0* 29.2*  PLT 320 352   BMET Recent Labs    09/03/19 0529 09/04/19 0454  NA 141 141  K 4.3 4.3  CL 106 107  CO2 26 26  GLUCOSE 163* 206*  BUN 44* 48*  CREATININE 1.95* 1.95*  CALCIUM 9.6 9.4   PT/INR No results for input(s): LABPROT, INR in the last 72 hours. ABG No results for input(s): PHART, HCO3 in the last 72 hours.  Invalid input(s): PCO2, PO2  Studies/Results: CT ABDOMEN PELVIS WO CONTRAST  Result Date: 09/04/2019 CLINICAL DATA:  Urothelial cancer of the right renal pelvis. Pyelonephritis. Stent placement. EXAM: CT ABDOMEN AND PELVIS WITHOUT CONTRAST TECHNIQUE: Multidetector CT imaging of the abdomen and pelvis was performed following the standard protocol without IV contrast. COMPARISON:  08/23/2019 FINDINGS: Despite efforts by the technologist and patient, motion artifact is present on today's exam and could not be eliminated. This reduces exam sensitivity and specificity. Lower chest: Increased size and potential loculation of the right pleural effusion. New small left pleural effusion. Abnormal fluid density in the retrosternal region on top most images such as image 1/2, significance uncertain. There is atelectasis of most of the right lower lobe with air bronchograms. Atherosclerotic calcification of thoracic aorta branch vessels. Low-density blood pool suggests anemia. Stable calcification of the pericardium along the cardiac apex suggesting prior calcific pericarditis. Hepatobiliary: Dependent density  in the gallbladder likely from small settling gallstones. Focal fatty infiltration along the falciform ligament. Pancreas: Unremarkable Spleen: Unremarkable Adrenals/Urinary Tract: Both adrenal glands  appear normal. The left kidney appears normal. Expanded right kidney with soft tissue density along the renal pelvis probably representing a combination of tumor and blood products given the increase in prominence over the last 12 days. A right ureteral stent is present proximal loop formed in the right kidney upper pole collecting system and distal loop in the urinary bladder. Suspected 0.4 cm right kidney lower pole nonobstructive renal calculus on image 56/2. Accentuated density in the right ureter around the stent, likely from blood products although a component of tumor is difficult to exclude. Mildly distended urinary bladder. Small amount of gas in the urinary bladder is likely from recent instrumentation. Worsening right perirenal stranding is observed. Stomach/Bowel: Stranding and indistinctness of tissue planes along the descending and proximal transverse duodenum, new inflammation in this vicinity is not excluded. Prominent stool throughout the colon favors constipation. Prominence of stool in the rectal vault with surrounding stranding in the margins of the perirectal space, early stercoral colitis is difficult to exclude. Sigmoid colon diverticulosis. Vascular/Lymphatic: Aortoiliac atherosclerotic vascular disease. Retrocaval node 1.3 cm in short axis on image 40/2, previously 0.9 cm. Right common iliac node 1.0 cm in short axis on image 61/2, previously same. Possible indistinct adenopathy in the vicinity of the bifurcation of the right common iliac artery, with the adjacent density of the right ureter noted. Tumor nodule or adenopathy adjacent to the right psoas muscle and near the external iliac vessels measures 2.4 by 1.5 cm on image 69/2, previously 2.3 by 1.3 cm. Reproductive: Presumed dystrophic calcifications along the introitus. The uterus is absent. Other: No supplemental non-categorized findings. Musculoskeletal: Expansile lytic and destructive 3.6 by 2.0 cm lesion in the right iliac bone on  image 63/2, rapidly progressive given that the lesion is not easily appreciable on the prior CT of the right hip from 08/01/2019. Fracture along the inferior endplate of L1 as shown on prior lumbar spine from 08/01/2019. New lytic expansile lesion in the posterior vertebral body at T12 causing a posterior bulge in the vertebral contour on image 62/6 compatible with metastatic lesion. There is a pathologic vertical fracture extending through the inferior and superior endplates along with associated endplate compression as shown on image 63/6. Enlarging sclerotic lesion in the L3 vertebral body measuring 1.3 cm in diameter on image 63/6, previously 0.8 by 0.4 cm on 08/01/2019, favoring a metastatic lesion. Bilateral pars defects at L5 appear chronic with grade 1 anterolisthesis. Lumbar spondylosis and degenerative disc disease cause multilevel impingement in the lumbar spine. IMPRESSION: 1. Enlarging soft tissue density along the right renal pelvis and right ureter, probably a combination of tumor and blood products given the increase in prominence over the last 12 days. Superimposed pyelonephritis is not excluded and there is increase in perirenal stranding on the right. A right ureteral stent is in place. 2. Rapidly progressive osseous metastatic lesions including a lytic and destructive 3.6 cm lesion of the right iliac bone; lytic expansile lesion posteriorly in the T12 vertebral body with associated pathologic fracture; and an enlarging sclerotic lesion in the L3 vertebral body. Stable inferior endplate fracture L1, indeterminate for malignant etiology. 3. Worsening retroperitoneal and right pelvic adenopathy. 4. New stranding and indistinctness of tissue planes along the descending and proximal transverse duodenum, potentially from duodenitis. 5. Enlarging right pleural effusion with potential loculation. New small left pleural effusion. 6. Prominent  stool throughout the colon favors constipation. Prominence of  stool in the rectal vault with surrounding stranding in the margins of the perirectal space, early stercoral colitis is difficult to exclude. 7. Other imaging findings of potential clinical significance: Low-density blood pool suggests anemia. Stable calcification of the pericardium along the cardiac apex suggesting prior calcific pericarditis. 0.4 cm right kidney lower pole nonobstructive renal calculus. Cholelithiasis. Sigmoid colon diverticulosis. Lumbar spondylosis and degenerative disc disease causing multilevel impingement. Chronic bilateral pars defects L5. Aortic Atherosclerosis (ICD10-I70.0). Electronically Signed   By: Van Clines M.D.   On: 09/04/2019 08:45    Anti-infectives: Anti-infectives (From admission, onward)   Start     Dose/Rate Route Frequency Ordered Stop   09/03/19 1600  ciprofloxacin (CIPRO) tablet 500 mg     500 mg Oral Daily 09/03/19 1506     08/28/19 1000  cephALEXin (KEFLEX) capsule 250 mg     250 mg Oral Every 12 hours 08/28/19 0833 08/30/19 2154   08/27/19 0951  ceFAZolin (ANCEF) IVPB 2g/100 mL premix     2 g 200 mL/hr over 30 Minutes Intravenous 30 min pre-op 08/27/19 0951 08/27/19 1030   08/24/19 1530  cefTRIAXone (ROCEPHIN) 1 g in sodium chloride 0.9 % 100 mL IVPB  Status:  Discontinued     1 g 200 mL/hr over 30 Minutes Intravenous Every 24 hours 08/24/19 1528 08/28/19 0832      Assessment/Plan:  Prognosis VERY poor. Estimated Live expectancy <41mos. There is nothing to offer surgically. Reiterated to family prognosis and STRONGLY rec hospice transition.   [name] 09/05/2019

## 2019-09-05 NOTE — Consult Note (Signed)
Reason for the request:    Renal pelvis tumor  HPI: I was asked by Dr. Nevada Crane to evaluate [name] regarding recent diagnosis of GU tract tumor.  She is a 86 year old woman with history of coronary artery disease, hypertension and cardiomyopathy hospitalized on March 20 with generalized weakness and hydronephrosis.  Her evaluation showed worsening renal failure at that time.  Her evaluation included cystoscopy and bilateral retrograde pyelogram and a insertion of a right ureteral stent.  Biopsy was obtained at that time and found to have urothelial carcinoma fragments.  Imaging studies including a CT scan on March 20 and repeated on April 1 showed hydronephrosis and perinephric stranding with a CT scan on April 1 confirm the presence of lymphadenopathy as well as metastatic disease to the bone.  Clinically, she is quite debilitated and has very limited mobility at this time.  She is not complaining of pain but does report overall discomfort previously.  She does report some abdominal distention did have an episode of diarrhea.  Denies any shortness of breath or difficulty breathing. She does not report any headaches, blurry vision, syncope or seizures. Does not report any fevers, chills or sweats.  Does not report any cough, wheezing or hemoptysis.  Does not report any chest pain, palpitation, orthopnea or leg edema.  Does not report any nausea, vomiting or abdominal pain.  Does not report any constipation or diarrhea.  Does not report any skeletal complaints.    Does not report frequency, urgency or hematuria.  Does not report any skin rashes or lesions. Does not report any heat or cold intolerance.  Does not report any lymphadenopathy or petechiae.  Does not report any anxiety or depression.  Remaining review of systems is negative.    Past Medical History:  Diagnosis Date  . Acute MI, anterior wall ([place])    a. 07/07/2011  . CAD (coronary artery disease)    a. 07/07/2011 anterior MI- Occluded LAD w/  emergent CABG x 2 (SVG->LAD->D1).  . 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 Radiation initiated 09/2011  . Hemorrhoid   . History of radiation therapy 09/05/11-10/12/11   thyoma  . HTN (hypertension)   . Hypothyroidism   . Ischemic cardiomyopathy    a. 07/17/11 Echo: EF 35%;  b. 09/19/2011 Echo: EF 30-35%, Mid-Dist AntSept & Apical AK, mild MR.  Small residual VSD.  Marland Kitchen Osteoarthritis of knee    Bilateral.  a. s/p LTKA ~2007;  b. s/p RTKA 10/2010  . Seasonal allergies   . Shortness of breath    recently SOB  . VSD (ventricular septal defect)    a.  07/07/2011 - Noted @ time of MI - s/p repair @ time of CABG;  b. 09/19/2011 Echo: small residula VSD (present on 2/11 echo also)  :  Past Surgical History:  Procedure Laterality Date  . ABDOMINAL HYSTERECTOMY    . CATARACT EXTRACTION, BILATERAL    . CORONARY ARTERY BYPASS GRAFT  07/07/2011   Procedure: CORONARY ARTERY BYPASS GRAFTING (CABG);  Surgeon: Tharon Aquas Adelene Idler, MD;  Location: [place];  Service: Open Heart Surgery;  Laterality: N/A;  times two using greater saphenous vein harvested via endovascular vein harvest  . CYSTOSCOPY/RETROGRADE/URETEROSCOPY Bilateral 08/27/2019   Procedure: CYSTOSCOPY, BILATERAL RETROGRADE, RIGHT URETEROSCOPY, RIGHT RENAL PELVIS BIOPSY, RIGHT URETERAL STENT PLACEMENT;  Surgeon: [name], MD;  Location: [facility];  Service: Urology;  Laterality: Bilateral;  75 MINS  . EYE SURGERY Bilateral    cataract  .  HEMORRHOID SURGERY    . JOINT REPLACEMENT    . LEFT HEART CATHETERIZATION WITH CORONARY ANGIOGRAM N/A 07/07/2011   Procedure: LEFT HEART CATHETERIZATION WITH CORONARY ANGIOGRAM;  Surgeon: Josue Hector, MD;  Location: [facility] CATH [facility];  Service: Cardiovascular;  Laterality: N/A;  . LUMBAR LAMINECTOMY/DECOMPRESSION MICRODISCECTOMY Left 08/19/2015   Procedure: Left Lumbar four-five Microdiskectomy;  Surgeon: Eustace Moore, MD;  Location: [place] [facility];  Service: Neurosurgery;  Laterality:  Left;  left  . MASS EXCISION  07/07/2011   Procedure: EXCISION MASS;  Surgeon: Tharon Aquas Adelene Idler, MD;  Location: [place];  Service: Open Heart Surgery;  Laterality: N/A;  Excision of mediastinal mass  . MYRINGOTOMY WITH TUBE PLACEMENT Right 10/24/2012   Procedure: MYRINGOTOMY WITH TUBE PLACEMENT;  Surgeon: Melissa Montane, MD;  Location: [place];  Service: ENT;  Laterality: Right;  . NASAL ENDOSCOPY WITH EPISTAXIS CONTROL N/A 10/24/2012   Procedure: NASAL ENDOSCOPY WITH EPISTAXIS CONTROL;  Surgeon: Melissa Montane, MD;  Location: [place];  Service: ENT;  Laterality: N/A;  nasal endoscopy without epistaxis control... only need 30 minutes   . RIGHT HEART CATHETERIZATION  07/07/2011   Procedure: RIGHT HEART CATH;  Surgeon: Josue Hector, MD;  Location: [facility] CATH LAB;  Service: Cardiovascular;;  . TONSILLECTOMY    . TOTAL KNEE ARTHROPLASTY     a.  Left ~ 2007, Right 10/2010  . VSD REPAIR  07/07/2011   Procedure: VENTRICULAR SEPTAL DEFECT (VSD) REPAIR;  Surgeon: Len Childs, MD;  Location: [place];  Service: Open Heart Surgery;  Laterality: N/A;  with bovine pericardium 4cm x 4cm  :   Current Facility-Administered Medications:  .  acetaminophen (TYLENOL) tablet 650 mg, 650 mg, Oral, Q6H PRN, 650 mg at 09/04/19 1705 **OR** acetaminophen (TYLENOL) suppository 650 mg, 650 mg, Rectal, Q6H PRN, [name], MD .  carvedilol (COREG) tablet 6.25 mg, 6.25 mg, Oral, BID WC, [name], MD, 6.25 mg at 09/05/19 [id number] .  feeding supplement (ENSURE ENLIVE) (ENSURE ENLIVE) liquid 237 mL, 237 mL, Oral, TID BM, Alekh, Kshitiz, MD, 237 mL at 09/04/19 1455 .  heparin injection 5,000 Units, 5,000 Units, Subcutaneous, Q8H, [name], MD, 5,000 Units at 09/04/19 2202 .  insulin aspart (novoLOG) injection 0-5 Units, 0-5 Units, Subcutaneous, QHS, Hall, Carole N, DO .  insulin aspart (novoLOG) injection 0-9 Units, 0-9 Units, Subcutaneous, TID WC, Hall, Carole N, DO .  levothyroxine (SYNTHROID) tablet 25 mcg, 25 mcg, Oral,  QAC breakfast, [name], MD, 25 mcg at 09/05/19 [id number] .  lip balm (CARMEX) ointment, , Topical, PRN, [name], MD .  MEDLINE mouth rinse, 15 mL, Mouth Rinse, BID, [name], MD, 15 mL at 09/04/19 2202 .  mirtazapine (REMERON) tablet 15 mg, 15 mg, Oral, QHS, Micheline Rough, MD, 15 mg at 09/04/19 2202 .  ondansetron (ZOFRAN) tablet 4 mg, 4 mg, Oral, Q6H PRN, 4 mg at 08/30/19 0300 **OR** ondansetron (ZOFRAN) injection 4 mg, 4 mg, Intravenous, Q6H PRN, [name], MD, 4 mg at 08/27/19 1630 .  piperacillin-tazobactam (ZOSYN) IVPB 2.25 g, 2.25 g, Intravenous, Q8H, Hall, Carole N, DO .  polyethylene glycol (MIRALAX / GLYCOLAX) packet 17 g, 17 g, Oral, Daily, [name], MD, 17 g at 09/05/19 1028 .  senna-docusate (Senokot-S) tablet 1 tablet, 1 tablet, Oral, Daily, [name], MD, 1 tablet at 09/05/19 1028 .  simvastatin (ZOCOR) tablet 40 mg, 40 mg, Oral, QPM, [name], MD, 40 mg at 09/04/19 1705 .  sodium chloride flush (NS) 0.9 % injection  3 mL, 3 mL, Intravenous, Once, [name], MD .  traMADol Veatrice Bourbon) tablet 50 mg, 50 mg, Oral, Q12H PRN, [name], MD, 50 mg at 09/03/19 2052:  Allergies  Allergen Reactions  . Amoxapine And Related Hives  . Clozapine Hives  :  Family History  Problem Relation Age of Onset  . Heart disease Mother   :  Social History   Socioeconomic History  . Marital status: Widowed    Spouse name: Not on file  . Number of children: 3  . Years of education: Not on file  . Highest education level: Not on file  Occupational History  . Occupation: Retired    Comment: office work  Tobacco Use  . Smoking status: Never Smoker  . Smokeless tobacco: Never Used  Substance and Sexual Activity  . Alcohol use: No  . Drug use: No  . Sexual activity: Not Currently  Other Topics Concern  . Not on file  Social History Narrative   Pt lives in [place] with her husband of 30 yrs.  Her husband has Parkinson's and she is his primary  care giver.     Social Determinants of Health   Financial Resource Strain:   . Difficulty of Paying Living Expenses:   Food Insecurity:   . Worried About Charity fundraiser in the Last Year:   . Arboriculturist in the Last Year:   Transportation Needs:   . Film/video editor (Medical):   Marland Kitchen Lack of Transportation (Non-Medical):   Physical Activity:   . Days of Exercise per Week:   . Minutes of Exercise per Session:   Stress:   . Feeling of Stress :   Social Connections:   . Frequency of Communication with Friends and Family:   . Frequency of Social Gatherings with Friends and Family:   . Attends Religious Services:   . Active Member of Clubs or Organizations:   . Attends Archivist Meetings:   Marland Kitchen Marital Status:   Intimate Partner Violence:   . Fear of Current or Ex-Partner:   . Emotionally Abused:   Marland Kitchen Physically Abused:   . Sexually Abused:   :  Pertinent items are noted in HPI.  Exam: Blood pressure (!) 157/63, pulse 87, temperature 98.3 F (36.8 C), temperature source Oral, resp. rate 14, height 5' (1.524 m), weight 140 lb 10.5 oz (63.8 kg), SpO2 94 %.  ECOG 3 General appearance: Fatigued but arousable without distress. Head: atraumatic without any abnormalities. Eyes: conjunctivae/corneas clear. PERRL.  Sclera anicteric. Throat: lips, mucosa, and tongue normal; without oral thrush or ulcers. Resp: clear to auscultation bilaterally without rhonchi, wheezes or dullness to percussion. Cardio: regular rate and rhythm, S1, S2 normal, no murmur, click, rub or gallop GI: soft, non-tender; bowel sounds normal; no masses,  no organomegaly Skin: Skin color, texture, turgor normal. No rashes or lesions Lymph nodes: Cervical, supraclavicular, and axillary nodes normal. Neurologic: Grossly normal without any motor, sensory or deep tendon reflexes. Musculoskeletal: No joint deformity or effusion.  Recent Labs    09/04/19 0454 09/05/19 0657  WBC 14.1* 16.3*   HGB 8.5* 9.1*  HCT 28.0* 29.2*  PLT 320 352   Recent Labs    09/04/19 0454 09/05/19 0657  NA 141 143  K 4.3 4.4  CL 107 106  CO2 26 26  GLUCOSE 206* 192*  BUN 48* 52*  CREATININE 1.95* 1.93*  CALCIUM 9.4 9.8     CT ABDOMEN PELVIS WO CONTRAST  Result Date:  09/04/2019 CLINICAL DATA:  Urothelial cancer of the right renal pelvis. Pyelonephritis. Stent placement. EXAM: CT ABDOMEN AND PELVIS WITHOUT CONTRAST TECHNIQUE: Multidetector CT imaging of the abdomen and pelvis was performed following the standard protocol without IV contrast. COMPARISON:  08/23/2019 FINDINGS: Despite efforts by the technologist and patient, motion artifact is present on today's exam and could not be eliminated. This reduces exam sensitivity and specificity. Lower chest: Increased size and potential loculation of the right pleural effusion. New small left pleural effusion. Abnormal fluid density in the retrosternal region on top most images such as image 1/2, significance uncertain. There is atelectasis of most of the right lower lobe with air bronchograms. Atherosclerotic calcification of thoracic aorta branch vessels. Low-density blood pool suggests anemia. Stable calcification of the pericardium along the cardiac apex suggesting prior calcific pericarditis. Hepatobiliary: Dependent density in the gallbladder likely from small settling gallstones. Focal fatty infiltration along the falciform ligament. Pancreas: Unremarkable Spleen: Unremarkable Adrenals/Urinary Tract: Both adrenal glands appear normal. The left kidney appears normal. Expanded right kidney with soft tissue density along the renal pelvis probably representing a combination of tumor and blood products given the increase in prominence over the last 12 days. A right ureteral stent is present proximal loop formed in the right kidney upper pole collecting system and distal loop in the urinary bladder. Suspected 0.4 cm right kidney lower pole nonobstructive renal  calculus on image 56/2. Accentuated density in the right ureter around the stent, likely from blood products although a component of tumor is difficult to exclude. Mildly distended urinary bladder. Small amount of gas in the urinary bladder is likely from recent instrumentation. Worsening right perirenal stranding is observed. Stomach/Bowel: Stranding and indistinctness of tissue planes along the descending and proximal transverse duodenum, new inflammation in this vicinity is not excluded. Prominent stool throughout the colon favors constipation. Prominence of stool in the rectal vault with surrounding stranding in the margins of the perirectal space, early stercoral colitis is difficult to exclude. Sigmoid colon diverticulosis. Vascular/Lymphatic: Aortoiliac atherosclerotic vascular disease. Retrocaval node 1.3 cm in short axis on image 40/2, previously 0.9 cm. Right common iliac node 1.0 cm in short axis on image 61/2, previously same. Possible indistinct adenopathy in the vicinity of the bifurcation of the right common iliac artery, with the adjacent density of the right ureter noted. Tumor nodule or adenopathy adjacent to the right psoas muscle and near the external iliac vessels measures 2.4 by 1.5 cm on image 69/2, previously 2.3 by 1.3 cm. Reproductive: Presumed dystrophic calcifications along the introitus. The uterus is absent. Other: No supplemental non-categorized findings. Musculoskeletal: Expansile lytic and destructive 3.6 by 2.0 cm lesion in the right iliac bone on image 63/2, rapidly progressive given that the lesion is not easily appreciable on the prior CT of the right hip from 08/01/2019. Fracture along the inferior endplate of L1 as shown on prior lumbar spine from 08/01/2019. New lytic expansile lesion in the posterior vertebral body at T12 causing a posterior bulge in the vertebral contour on image 62/6 compatible with metastatic lesion. There is a pathologic vertical fracture extending  through the inferior and superior endplates along with associated endplate compression as shown on image 63/6. Enlarging sclerotic lesion in the L3 vertebral body measuring 1.3 cm in diameter on image 63/6, previously 0.8 by 0.4 cm on 08/01/2019, favoring a metastatic lesion. Bilateral pars defects at L5 appear chronic with grade 1 anterolisthesis. Lumbar spondylosis and degenerative disc disease cause multilevel impingement in the lumbar spine. IMPRESSION: 1.  Enlarging soft tissue density along the right renal pelvis and right ureter, probably a combination of tumor and blood products given the increase in prominence over the last 12 days. Superimposed pyelonephritis is not excluded and there is increase in perirenal stranding on the right. A right ureteral stent is in place. 2. Rapidly progressive osseous metastatic lesions including a lytic and destructive 3.6 cm lesion of the right iliac bone; lytic expansile lesion posteriorly in the T12 vertebral body with associated pathologic fracture; and an enlarging sclerotic lesion in the L3 vertebral body. Stable inferior endplate fracture L1, indeterminate for malignant etiology. 3. Worsening retroperitoneal and right pelvic adenopathy. 4. New stranding and indistinctness of tissue planes along the descending and proximal transverse duodenum, potentially from duodenitis. 5. Enlarging right pleural effusion with potential loculation. New small left pleural effusion. 6. Prominent stool throughout the colon favors constipation. Prominence of stool in the rectal vault with surrounding stranding in the margins of the perirectal space, early stercoral colitis is difficult to exclude. 7. Other imaging findings of potential clinical significance: Low-density blood pool suggests anemia. Stable calcification of the pericardium along the cardiac apex suggesting prior calcific pericarditis. 0.4 cm right kidney lower pole nonobstructive renal calculus. Cholelithiasis. Sigmoid colon  diverticulosis. Lumbar spondylosis and degenerative disc disease causing multilevel impingement. Chronic bilateral pars defects L5. Aortic Atherosclerosis (ICD10-I70.0). Electronically Signed   By: Van Clines M.D.   On: 09/04/2019 08:45   US RENAL  Result Date: 08/24/2019 CLINICAL DATA:  Chronic kidney disease, acute renal failure EXAM: RENAL / URINARY TRACT ULTRASOUND COMPLETE COMPARISON:  CT 08/23/2019 FINDINGS: Right Kidney: Renal measurements: 11.4 x 5.7 x 6.9 cm = volume: 235 mL. Moderate right hydronephrosis as seen on CT. Normal echotexture. No visible renal mass. Left Kidney: Renal measurements: 7.9 x 4.3 x 2.3 cm = volume: 92 mL. Echogenicity within normal limits. No mass or hydronephrosis visualized. Bladder: Appears normal for degree of bladder distention. Other: None. IMPRESSION: Moderate right hydronephrosis.  No visible renal mass. Electronically Signed   By: Rolm Baptise M.D.   On: 08/24/2019 00:51   DG C-Arm 1-60 Min-No Report  Result Date: 08/27/2019 Fluoroscopy was utilized by the requesting physician.  No radiographic interpretation.   CT Renal Stone Study  Result Date: 08/23/2019 CLINICAL DATA:  Abdominal pain, weakness EXAM: CT ABDOMEN AND PELVIS WITHOUT CONTRAST TECHNIQUE: Multidetector CT imaging of the abdomen and pelvis was performed following the standard protocol without IV contrast. COMPARISON:  08/08/2019 FINDINGS: Lower chest: Small right pleural effusion, decreased since prior study. Heart is normal size. Calcifications at a apex likely related to old infarct. Hepatobiliary: Layering gallstones within the gallbladder. No focal hepatic abnormality. Pancreas: No focal abnormality or ductal dilatation. Spleen: No focal abnormality.  Normal size. Adrenals/Urinary Tract: Severe right hydronephrosis with perinephric stranding. The right ureter is difficult to follow at the pelvic brim, but is decompressed distally. Soft tissue fullness in the region of the retroperitoneum  at the pelvic brim. Cannot exclude Peri ureteral mass/neoplasm or stricture. The appearance is similar to prior study. Again, soft tissue fullness in the central right kidney, cannot exclude a midpole pararenal soft tissue mass, difficult to evaluate on this noncontrast study. No hydronephrosis on the left. Adrenal glands and urinary bladder unremarkable. Stomach/Bowel: Sigmoid diverticulosis. No active diverticulitis. No evidence of bowel obstruction. Moderate stool burden. Vascular/Lymphatic: Aortic atherosclerosis. No enlarged abdominal or pelvic lymph nodes. Reproductive: Prior hysterectomy.  No adnexal masses. Other: No free fluid or free air. Musculoskeletal: No acute bony abnormality. IMPRESSION:  Again noted is severe right hydronephrosis and perinephric stranding. The right ureter is dilated to the pelvic brim where there is increased soft tissue. Cannot exclude ureteral or peri ureteral mass/neoplasm or stricture. This has a similar appearance to recent [facility] study. Soft tissue fullness in the midpole of the right kidney. Cannot exclude mass. This cannot be characterized or evaluated on this noncontrast study. Cholelithiasis. Small right pleural effusion, decreased since prior study. Aortic atherosclerosis. Sigmoid diverticulosis. Electronically Signed   By: Rolm Baptise M.D.   On: 08/23/2019 22:30    Assessment and Plan:   86 year old woman with:  1.  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  This is in the setting of quite debilitated and poor performance status.  Prior to this hospitalization her performance status and activity were reasonable for her family.  The natural course of this disease was reviewed today with the patient as well as her family extensively.  Her disease is currently incurable without any role for surgery.  The role of systemic therapy was  discussed today in detail.  Given the fact that we are dealing with urothelial carcinoma intravenous treatment is the only option to palliate her disease.  Options of systemic chemotherapy versus immunotherapy were reviewed and she is a candidate for neither of those treatment at this time.  A trial of immunotherapy could be attempted after period of rehabilitation of her performance status improves.  The role of this therapy would be purely palliative and the likelihood of success is low given her extensive aggressive disease as well and debilitation.  The plan at this point is to proceed with a period of rehabilitation and assess her performance status in the next 2 weeks.  If she improves consideration for palliative therapy would be considered.  If she continues to decline that hospice would be the recommendation.  I am not optimistic about her ability to recover and this was conveyed today extensively to her family.  2.  Hydronephrosis: Related to her tumor in the renal pelvis.  Ureteral stent placed with stable kidney function.  3.  Prognosis: Overall poor with disease that is incurable.  Palliative therapy can be considered if she proves but if she continues to have poor performance status that her life expectancy likely is estimated in few months.   80  minutes were dedicated to this visit.  More than 50% of the time was spent face-to-face and was dedicated to reviewing laboratory data, imaging studies, discussing treatment options, and answering questions and concerns from her family present today and via phone.

## 2019-09-06 DIAGNOSIS — Z7401 Bed confinement status: Secondary | ICD-10-CM | POA: Diagnosis not present

## 2019-09-06 DIAGNOSIS — C801 Malignant (primary) neoplasm, unspecified: Secondary | ICD-10-CM | POA: Diagnosis not present

## 2019-09-06 DIAGNOSIS — Z7189 Other specified counseling: Secondary | ICD-10-CM | POA: Diagnosis not present

## 2019-09-06 DIAGNOSIS — I959 Hypotension, unspecified: Secondary | ICD-10-CM | POA: Diagnosis not present

## 2019-09-06 DIAGNOSIS — R52 Pain, unspecified: Secondary | ICD-10-CM | POA: Diagnosis not present

## 2019-09-06 DIAGNOSIS — R1311 Dysphagia, oral phase: Secondary | ICD-10-CM | POA: Diagnosis not present

## 2019-09-06 DIAGNOSIS — N179 Acute kidney failure, unspecified: Secondary | ICD-10-CM | POA: Diagnosis not present

## 2019-09-06 DIAGNOSIS — I5022 Chronic systolic (congestive) heart failure: Secondary | ICD-10-CM | POA: Diagnosis not present

## 2019-09-06 DIAGNOSIS — Z03818 Encounter for observation for suspected exposure to other biological agents ruled out: Secondary | ICD-10-CM | POA: Diagnosis not present

## 2019-09-06 DIAGNOSIS — C651 Malignant neoplasm of right renal pelvis: Secondary | ICD-10-CM | POA: Diagnosis not present

## 2019-09-06 DIAGNOSIS — C659 Malignant neoplasm of unspecified renal pelvis: Secondary | ICD-10-CM | POA: Diagnosis not present

## 2019-09-06 DIAGNOSIS — Z79899 Other long term (current) drug therapy: Secondary | ICD-10-CM | POA: Diagnosis not present

## 2019-09-06 DIAGNOSIS — Z20828 Contact with and (suspected) exposure to other viral communicable diseases: Secondary | ICD-10-CM | POA: Diagnosis not present

## 2019-09-06 DIAGNOSIS — R410 Disorientation, unspecified: Secondary | ICD-10-CM | POA: Diagnosis not present

## 2019-09-06 DIAGNOSIS — M2550 Pain in unspecified joint: Secondary | ICD-10-CM | POA: Diagnosis not present

## 2019-09-06 DIAGNOSIS — R531 Weakness: Secondary | ICD-10-CM | POA: Diagnosis not present

## 2019-09-06 DIAGNOSIS — Z515 Encounter for palliative care: Secondary | ICD-10-CM | POA: Diagnosis not present

## 2019-09-06 DIAGNOSIS — E890 Postprocedural hypothyroidism: Secondary | ICD-10-CM | POA: Diagnosis not present

## 2019-09-06 DIAGNOSIS — R488 Other symbolic dysfunctions: Secondary | ICD-10-CM | POA: Diagnosis not present

## 2019-09-06 DIAGNOSIS — C641 Malignant neoplasm of right kidney, except renal pelvis: Secondary | ICD-10-CM | POA: Diagnosis not present

## 2019-09-06 DIAGNOSIS — N1832 Chronic kidney disease, stage 3b: Secondary | ICD-10-CM | POA: Diagnosis not present

## 2019-09-06 DIAGNOSIS — R0902 Hypoxemia: Secondary | ICD-10-CM | POA: Diagnosis not present

## 2019-09-06 DIAGNOSIS — M6281 Muscle weakness (generalized): Secondary | ICD-10-CM | POA: Diagnosis not present

## 2019-09-06 DIAGNOSIS — E1122 Type 2 diabetes mellitus with diabetic chronic kidney disease: Secondary | ICD-10-CM | POA: Diagnosis not present

## 2019-09-06 DIAGNOSIS — R2689 Other abnormalities of gait and mobility: Secondary | ICD-10-CM | POA: Diagnosis not present

## 2019-09-06 DIAGNOSIS — D649 Anemia, unspecified: Secondary | ICD-10-CM | POA: Diagnosis not present

## 2019-09-06 LAB — CBC WITH DIFFERENTIAL/PLATELET
Abs Immature Granulocytes: 0.14 10*3/uL — ABNORMAL HIGH (ref 0.00–0.07)
Basophils Absolute: 0.1 10*3/uL (ref 0.0–0.1)
Basophils Relative: 1 %
Eosinophils Absolute: 0.4 10*3/uL (ref 0.0–0.5)
Eosinophils Relative: 3 %
HCT: 28.8 % — ABNORMAL LOW (ref 36.0–46.0)
Hemoglobin: 8.6 g/dL — ABNORMAL LOW (ref 12.0–15.0)
Immature Granulocytes: 1 %
Lymphocytes Relative: 5 %
Lymphs Abs: 0.6 10*3/uL — ABNORMAL LOW (ref 0.7–4.0)
MCH: 26.2 pg (ref 26.0–34.0)
MCHC: 29.9 g/dL — ABNORMAL LOW (ref 30.0–36.0)
MCV: 87.8 fL (ref 80.0–100.0)
Monocytes Absolute: 1 10*3/uL (ref 0.1–1.0)
Monocytes Relative: 7 %
Neutro Abs: 11.5 10*3/uL — ABNORMAL HIGH (ref 1.7–7.7)
Neutrophils Relative %: 83 %
Platelets: 333 10*3/uL (ref 150–400)
RBC: 3.28 MIL/uL — ABNORMAL LOW (ref 3.87–5.11)
RDW: 15.9 % — ABNORMAL HIGH (ref 11.5–15.5)
WBC: 13.7 10*3/uL — ABNORMAL HIGH (ref 4.0–10.5)
nRBC: 0 % (ref 0.0–0.2)

## 2019-09-06 LAB — BASIC METABOLIC PANEL
Anion gap: 10 (ref 5–15)
BUN: 48 mg/dL — ABNORMAL HIGH (ref 8–23)
CO2: 26 mmol/L (ref 22–32)
Calcium: 9.8 mg/dL (ref 8.9–10.3)
Chloride: 108 mmol/L (ref 98–111)
Creatinine, Ser: 1.82 mg/dL — ABNORMAL HIGH (ref 0.44–1.00)
GFR calc Af Amer: 27 mL/min — ABNORMAL LOW (ref >60)
GFR calc non Af Amer: 24 mL/min — ABNORMAL LOW (ref >60)
Glucose, Bld: 182 mg/dL — ABNORMAL HIGH (ref 70–99)
Potassium: 4.8 mmol/L (ref 3.5–5.1)
Sodium: 144 mmol/L (ref 135–145)

## 2019-09-06 LAB — SARS CORONAVIRUS 2 (TAT 6-24 HRS): SARS Coronavirus 2: NEGATIVE

## 2019-09-06 LAB — GLUCOSE, CAPILLARY
Glucose-Capillary: 141 mg/dL — ABNORMAL HIGH (ref 70–99)
Glucose-Capillary: 157 mg/dL — ABNORMAL HIGH (ref 70–99)

## 2019-09-06 LAB — PHOSPHORUS: Phosphorus: 3.8 mg/dL (ref 2.5–4.6)

## 2019-09-06 LAB — MAGNESIUM: Magnesium: 2 mg/dL (ref 1.7–2.4)

## 2019-09-06 MED ORDER — SACCHAROMYCES BOULARDII 250 MG PO CAPS: 250.0000 mg | ORAL_CAPSULE | Freq: Two times a day (BID) | ORAL | 0 refills | Status: AC

## 2019-09-06 MED ORDER — MORPHINE SULFATE (CONCENTRATE) 10 MG/0.5ML PO SOLN: 5.0000 mg | ORAL | 0 refills | Status: AC | PRN

## 2019-09-06 MED ORDER — INSULIN ASPART 100 UNIT/ML FLEXPEN: 2.0000 [IU] | PEN_INJECTOR | Freq: Three times a day (TID) | SUBCUTANEOUS | 0 refills | Status: DC

## 2019-09-06 MED ORDER — CIPROFLOXACIN HCL 500 MG PO TABS: 500.0000 mg | ORAL_TABLET | Freq: Two times a day (BID) | ORAL | 0 refills | Status: AC

## 2019-09-06 MED ORDER — MIRTAZAPINE 15 MG PO TABS: 15.0000 mg | ORAL_TABLET | Freq: Every day | ORAL | 0 refills | Status: AC

## 2019-09-06 MED ORDER — TRAMADOL HCL 50 MG PO TABS: 50.0000 mg | ORAL_TABLET | Freq: Two times a day (BID) | ORAL | 0 refills | Status: AC | PRN

## 2019-09-06 MED ORDER — ENSURE ENLIVE PO LIQD: 237.0000 mL | Freq: Three times a day (TID) | ORAL | 0 refills | Status: AC

## 2019-09-06 MED ORDER — INSULIN ASPART 100 UNIT/ML FLEXPEN: 1.0000 [IU] | PEN_INJECTOR | Freq: Three times a day (TID) | SUBCUTANEOUS | 0 refills | Status: AC

## 2019-09-06 NOTE — Progress Notes (Signed)
Daily Progress Note   Patient Name: [name]       Date: 09/06/2019 DOB: [birth year]  Age: 86 y.o. MRN#: [id number] Attending Physician: Kayleen Memos, DO Primary Care Physician: Scheryl Marten, [place] Admit Date: 08/23/2019  Reason for Consultation/Follow-up: Establishing goals of care, Non pain symptom management and Pain control  Subjective: Ms. [name] is awake alert sitting up in bed.  Finishing her breakfast and her coffee. Denies pain.     Length of Stay: 13  Current Medications: Scheduled Meds:  . carvedilol  6.25 mg Oral BID WC  . feeding supplement (ENSURE ENLIVE)  237 mL Oral TID BM  . heparin  5,000 Units Subcutaneous Q8H  . insulin aspart  0-5 Units Subcutaneous QHS  . insulin aspart  0-9 Units Subcutaneous TID WC  . levothyroxine  25 mcg Oral QAC breakfast  . mouth rinse  15 mL Mouth Rinse BID  . mirtazapine  15 mg Oral QHS  . polyethylene glycol  17 g Oral Daily  . senna-docusate  1 tablet Oral Daily  . simvastatin  40 mg Oral QPM  . sodium chloride flush  3 mL Intravenous Once    Continuous Infusions: . piperacillin-tazobactam 2.25 g (09/06/19 1057)    PRN Meds: acetaminophen **OR** acetaminophen, lip balm, morphine CONCENTRATE, ondansetron **OR** ondansetron (ZOFRAN) IV, traMADol  Physical Exam         Awake alert appears with generalized weakness No acute distress Appears to have regular work of breathing Abdomen is not distended Does not appear to have edema Responds and interacts appropriately  Vital Signs: BP (!) 132/41 (BP Location: Right Arm)   Pulse 72   Temp (!) 97.5 F (36.4 C) (Oral)   Resp 20   Ht 5' (1.524 m)   Wt 63.8 kg   SpO2 96%   BMI 27.47 kg/m  SpO2: SpO2: 96 % O2 Device: O2 Device: Room Air O2 Flow Rate: O2 Flow Rate  (L/min): 2 L/min  Intake/output summary:   Intake/Output Summary (Last 24 hours) at 09/06/2019 1114 Last data filed at 09/06/2019 [id number] Gross per 24 hour  Intake 417 ml  Output 100 ml  Net 317 ml   LBM: Last BM Date: 09/05/19 Baseline Weight: Weight: 58.5 kg Most recent weight: Weight: 63.8 kg       Palliative Assessment/Data:  Flowsheet Rows     Most Recent Value  Intake Tab  Referral Department  Hospitalist  Unit at Time of Referral  Oncology Unit  Palliative Care Primary Diagnosis  Cancer  Date Notified  08/25/19  Palliative Care Type  New Palliative care  Reason for referral  Clarify Goals of Care  Date of Admission  08/23/19  Date first seen by Palliative Care  08/26/19  # of days Palliative referral response time  1 Day(s)  # of days IP prior to Palliative referral  2  Clinical Assessment  Palliative Performance Scale Score  60%  Psychosocial & Spiritual Assessment  Palliative Care Outcomes  Patient/Family meeting held?  Yes  Who was at the meeting?  Patient, daughter, son      Patient Active Problem List   Diagnosis Date Noted  . Palliative care by specialist   . Goals of care, counseling/discussion   . Generalized pain   . General weakness   . Cancer of renal pelvis, right ([place]) 08/27/2019  . AKI (acute kidney injury) ([place]) 08/24/2019  . Acute renal failure superimposed on stage 3b chronic kidney disease ([place]) 08/23/2019  . Type 2 diabetes mellitus with stage 3 chronic kidney disease ([place]) 08/23/2019  . Hyperlipidemia associated with type 2 diabetes mellitus ([place]) 08/23/2019  . Normocytic anemia 08/23/2019  . Hypothyroidism 08/23/2019  . Hydronephrosis of right kidney 08/23/2019  . Hyponatremia 08/23/2019  . Hip pain 04/22/2019  . Pain in left knee 06/07/2017  . Acute non-infective otitis externa of right ear 10/31/2016  . Impacted cerumen of both ears 12/27/2015  . Marginal perforation of tympanic membrane of right ear 12/27/2015  . Sensorineural  hearing loss (SNHL), bilateral 12/27/2015  . S/P lumbar laminectomy 08/19/2015  . Ischemic cardiomyopathy   . Chronic systolic CHF (congestive heart failure) ([place]) 08/18/2011  . Pleural effusion 08/18/2011  . Cellulitis 08/18/2011  . Dyspnea 08/17/2011  . 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 Physical deconditioning 07/19/2011  . Cancer ([place]) 07/08/2011  . Acute anterior wall MI ([place]) 07/07/2011  . Myocardial infarction ([place]) 07/07/2011  . Hypertension associated with diabetes ([place])   . Osteoarthritis of knee   . CAD (coronary artery disease)   . 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 Generalized weakness, debility and declining functional status   Recommendations/Plan:  Continue current pain and non pain symptom management, took one dose of PRN Morphine solution so far.   SNF rehab with palliative and oncology follow up in the outpatient setting.  No additional PMT specific recommendations at this time.     Code Status:    Code Status Orders  (From admission, onward)         Start     Ordered   08/23/19 2338  Do not attempt resuscitation (DNR)  Continuous    Question Answer Comment  In the event of cardiac or respiratory ARREST Do not call a "code blue"   In the event of cardiac or respiratory ARREST Do not perform Intubation, CPR, defibrillation or ACLS   In the event of cardiac or respiratory ARREST Use medication by any route, position, wound care, and other measures to relive pain and suffering. May use oxygen, suction and manual treatment of airway obstruction as needed for comfort.  08/23/19 2338        Code Status History    Date Active Date Inactive Code Status Order ID  Comments User Context   04/22/2019 2045 04/24/2019 1721 DNR [id number]  Truddie Hidden, MD Inpatient   07/17/2011 1340 07/18/2011 2016 Full Code [id number]  Grace Isaac, MD Inpatient   Advance Care Planning Activity    Advance Directive Documentation     Most Recent Value  Type of Advance Directive  Healthcare Power of Attorney, Living will  Pre-existing out of facility DNR order (yellow form or pink MOST form)  --  "MOST" Form in Place?  --       Prognosis:   few months.   Discharge Planning:  [place] for rehab with Palliative care service follow-up  Care plan was discussed with patient, her son Zenia Resides on the phone.  Thank you for allowing the Palliative Medicine Team to assist in the care of this patient.   Time In: 11 Time Out: 11.25 Total Time 25 Prolonged Time Billed  no       Greater than 50%  of this time was spent counseling and coordinating care related to the above assessment and plan.  Loistine Chance, MD  Please contact Palliative Medicine Team phone at [phone] for questions and concerns.

## 2019-09-06 NOTE — Progress Notes (Signed)
[facility] Liaison: Referral Note  Liaison received a referral for [facility] Palliative care. Pt will be discharged today to [facility].    Spoke with daughter [name] [phone] for acknowledgement. Daughter informed  Development worker, community will call and initiate service at [facility].  Thank you for the referral.  Ellie Lunch [facility] Liaison (in [place]) [phone]

## 2019-09-06 NOTE — Discharge Instructions (Signed)
Chronic Kidney Disease, Adult Chronic kidney disease (CKD) happens when the kidneys are damaged over a long period of time. The kidneys are two organs that help with:  Getting rid of waste and extra fluid from the blood.  Making hormones that maintain the amount of fluid in your tissues and blood vessels.  Making sure that the body has the right amount of fluids and chemicals. Most of the time, CKD does not go away, but it can usually be controlled. Steps must be taken to slow down the kidney damage or to stop it from getting worse. If this is not done, the kidneys may stop working. Follow these instructions at home: Medicines  Take over-the-counter and prescription medicines only as told by your doctor. You may need to change the amount of medicines you take.  Do not take any new medicines unless your doctor says it is okay. Many medicines can make your kidney damage worse.  Do not take any vitamin and supplements unless your doctor says it is okay. Many vitamins and supplements can make your kidney damage worse. General instructions  Follow a diet as told by your doctor. You may need to stay away from: ? Alcohol. ? Salty foods. ? Foods that are high in:  Potassium.  Calcium.  Protein.  Do not use any products that contain nicotine or tobacco, such as cigarettes and e-cigarettes. If you need help quitting, ask your doctor.  Keep track of your blood pressure at home. Tell your doctor about any changes.  If you have diabetes, keep track of your blood sugar as told by your doctor.  Try to stay at a healthy weight. If you need help, ask your doctor.  Exercise at least 30 minutes a day, 5 days a week.  Stay up-to-date with your shots (immunizations) as told by your doctor.  Keep all follow-up visits as told by your doctor. This is important. Contact a doctor if:  Your symptoms get worse.  You have new symptoms. Get help right away if:  You have symptoms of end-stage  kidney disease. These may include: ? Headaches. ? Numbness in your hands or feet. ? Easy bruising. ? Having hiccups often. ? Chest pain. ? Shortness of breath. ? Stopping of menstrual periods in women.  You have a fever.  You have very little pee (urine).  You have pain or bleeding when you pee. Summary  Chronic kidney disease (CKD) happens when the kidneys are damaged over a long period of time.  Most of the time, this condition does not go away, but it can usually be controlled. Steps must be taken to slow down the kidney damage or to stop it from getting worse.  Treatment may include a combination of medicines and lifestyle changes. This information is not intended to replace advice given to you by your health care provider. Make sure you discuss any questions you have with your health care provider. Document Revised: 05/04/2017 Document Reviewed: 06/26/2016 Elsevier Patient Education  [place].   Weakness Weakness is a lack of strength. You may feel weak all over your body (generalized), or you may feel weak in one part of your body (focal). There are many potential causes of weakness. Sometimes, the cause of your weakness may not be known. Some causes of weakness can be serious, so it is important to see your doctor. Follow these instructions at home: Activity  Rest as needed.  Try to get enough sleep. Most adults need 7-8 hours of sleep  each night. Talk to your doctor about how much sleep you need each night.  Do exercises, such as arm curls and leg raises, for 30 minutes at least 2 days a week or as told by your doctor.  Think about working with a physical therapist or trainer to help you get stronger. General instructions   Take over-the-counter and prescription medicines only as told by your doctor.  Eat a healthy, well-balanced diet. This includes: ? Proteins to build muscles, such as lean meats and fish. ? Fresh fruits and vegetables. ? Carbohydrates  to boost energy, such as whole grains.  Drink enough fluid to keep your pee (urine) pale yellow.  Keep all follow-up visits as told by your doctor. This is important. Contact a doctor if:  Your weakness does not get better or it gets worse.  Your weakness affects your ability to: ? Think clearly. ? Do your normal daily activities. Get help right away if you:  Have sudden weakness on one side of your face or body.  Have chest pain.  Have trouble breathing or shortness of breath.  Have problems with your vision.  Have trouble talking or swallowing.  Have trouble standing or walking.  Are light-headed.  Pass out (lose consciousness). Summary  Weakness is a lack of strength. You may feel weak all over your body or just in one part of your body.  There are many potential causes of weakness. Sometimes, the cause of your weakness may not be known.  Rest as needed, and try to get enough sleep. Most adults need 7-8 hours of sleep each night.  Eat a healthy, well-balanced diet. This information is not intended to replace advice given to you by your health care provider. Make sure you discuss any questions you have with your health care provider. Document Revised: 12/26/2017 Document Reviewed: 12/26/2017 Elsevier Patient Education  2020 [place].  Kidney Cancer  Kidney cancer is an abnormal growth of cells in one or both kidneys. The kidneys filter waste from your blood and produce urine. Kidney cancer may spread to other parts of your body. This type of cancer may also be called renal cell carcinoma. What are the causes? The cause of this condition is not always known. In some cases, abnormal changes to genes (genetic mutations) can cause cells to form cancer. What increases the risk? You may be more likely to develop kidney cancer if you:  Are over age 34. The risk increases with age.  Have a family history of kidney cancer.  Are of African-American, Native American,  or Native [place] descent.  Smoke.  Are female.  Are obese.  Have high blood pressure (hypertension).  Have advanced kidney disease, especially if you need long-term dialysis.  Have certain conditions that are passed from parent to child (inherited), such as von Hippel-Lindau disease, tuberous sclerosis, or hereditary papillary renal carcinoma.  Have been exposed to certain chemicals. What are the signs or symptoms? In the early stages, kidney cancer does not cause symptoms. As the cancer grows, symptoms may include:  Blood in the urine.  Pain in the upper back or abdomen, just below the rib cage. You may feel pain on one or both sides of the body.  Fatigue.  Unexplained weight loss.  Fever. How is this diagnosed? This condition may be diagnosed based on:  Your symptoms and medical history.  A physical exam.  Blood and urine tests.  X-rays.  Imaging tests, such as CT scans, MRIs, and PET scans.  Having dye injected into your blood through an IV, and then having X-rays taken of: ? Your kidneys and the rest of the organs involved in making and storing urine (intravenous pyelogram). ? Your blood vessels (angiogram).  Removal and testing of a kidney tissue sample (biopsy). Your cancer will be assessed (staged), based on how severe it is and how much it has spread. How is this treated? Treatment depends on the type and stage of the cancer. Treatment may include one or more of the following:  Surgery. This may include surgery to remove: ? Just the tumor (nephron-sparing surgery). ? The entire kidney (nephrectomy). ? The kidney, some of the surrounding healthy tissue, nearby lymph nodes, and the adrenal gland in certain cases (radical nephrectomy).  Medicines that kill cancer cells (chemotherapy).  High-energy rays that kill cancer cells (radiation therapy).  Targeted therapy. This targets specific parts of cancer cells and the area around them to block the growth and  the spread of the cancer. Targeted therapy can help to limit the damage to healthy cells.  Medicines that help your body's disease-fighting system (immune system) fight cancer cells (immunotherapy).  Freezing cancer cells using gas or liquid that is delivered through a needle (cryoablation).  Destroying cancer cells using high-energy radio waves that are delivered through a needle-like probe (radiofrequency ablation).  A procedure to block the artery that supplies blood to the tumor, which kills the cancer cells (embolization). Follow these instructions at home: Eating and drinking  Some of your treatments might affect your appetite and your ability to chew and swallow. If you are having problems eating, or if you do not have an appetite, meet with a diet and nutrition specialist (dietitian).  If you have side effects that affect eating, it may help to: ? Eat smaller meals and snacks often. ? Drink high-nutrition and high-calorie shakes or supplements. ? Eat bland and soft foods that are easy to eat. ? Not eat foods that are hot, spicy, or hard to swallow. Lifestyle  Do not drink alcohol.  Do not use any products that contain nicotine or tobacco, such as cigarettes and e-cigarettes. If you need help quitting, ask your health care provider. General instructions   Take over-the-counter and prescription medicines only as told by your health care provider. This includes vitamins, supplements, and herbal products.  Consider joining a support group to help you cope with the stress of having kidney cancer.  Work with your health care provider to manage any side effects of treatment.  Keep all follow-up visits as told by your health care provider. This is important. Where to find more information  American Cancer Society: https://www.cancer.[place] ([place]): https://www.cancer.gov Contact a health care provider if you:  Notice that you bruise or bleed  easily.  Are losing weight without trying.  Have new or increased fatigue or weakness. Get help right away if you have:  Blood in your urine.  A sudden increase in pain.  A fever.  Shortness of breath.  Chest pain.  Yellow skin or whites of your eyes (jaundice). Summary  Kidney cancer is an abnormal growth of cells (tumor) in one or both kidneys. Tumors may spread to other parts of your body.  In the early stages, kidney cancer does not cause symptoms. As the cancer grows, symptoms may include blood in the urine, pain in the upper back or abdomen, unexplained weight loss, fatigue, and fever.  Treatment depends on the type and stage of the cancer. It  may include surgery to remove the tumor, procedures and medicines to kill the cancer cells, or medicines to help your body fight cancer cells. This information is not intended to replace advice given to you by your health care provider. Make sure you discuss any questions you have with your health care provider. Document Revised: 06/13/2017 Document Reviewed: 06/10/2017 Elsevier Patient Education  [place].

## 2019-09-06 NOTE — Discharge Summary (Addendum)
Discharge Summary  [name] [id number] DOB: 01-29-1925  PCP: Misty Marten, PA  Admit date: 08/23/2019 Discharge date: 09/06/2019  Time spent: 35 minutes   Recommendations for Outpatient Follow-up:  1. Follow up with Palliative care 2. Follow up with your PCP 3. Follow up with oncology 4. Follow up with urology 5. Take your medications as prescribed  Discharge Diagnoses:  Active Hospital Problems   Diagnosis Date Noted  . Acute renal failure superimposed on stage 3b chronic kidney disease ([place]) 08/23/2019  . Palliative care by specialist   . Goals of care, counseling/discussion   . Generalized pain   . General weakness   . Cancer of renal pelvis, right ([place]) 08/27/2019  . AKI (acute kidney injury) ([place]) 08/24/2019  . Type 2 diabetes mellitus with stage 3 chronic kidney disease ([place]) 08/23/2019  . Hyperlipidemia associated with type 2 diabetes mellitus ([place]) 08/23/2019  . Normocytic anemia 08/23/2019  . Hypothyroidism 08/23/2019  . Hydronephrosis of right kidney 08/23/2019  . Hyponatremia 08/23/2019  . Chronic systolic CHF (congestive heart failure) ([place]) 08/18/2011  . Hypertension associated with diabetes ([place])   . CAD (coronary artery disease)     Resolved Hospital Problems  No resolved problems to display.    Discharge Condition: Stable   Diet recommendation: Resume previous diet.  Vitals:   09/06/19 0600 09/06/19 1044  BP: (!) 132/41   Pulse: 72   Resp: 20   Temp: (!) 97.5 F (36.4 C)   SpO2: 100% 96%    History of present illness:  86 year old female with history of CAD status post CABG, ICM, chronic systolic CHF with EF of 30 to 35% in 2013, chronic kidney disease stage III, hypertension, hyperlipidemia, hypothyroidism, VSD status post patch repair, diabetes mellitus type 2, anemia of chronic disease, history of thymoma status post resection and radiation presented from home with generalized weakness and decreased p.o. intake. She has been  following with urology as an outpatient and had plans for cystoscopy/ureteroscopy on 08/27/2019. Preop labs on 08/21/2019 showed worsening renal function with creatinine of 2.58 compared to baseline of 1.4 along with hyponatremia of 126. Patient was admitted for the same.Urology, [name] completed cystoscopy and ureteroscopy with ureteral stent placement.   Biopsies taken later revealed detached small fragments of high grade papillary urothelial carcinoma.  Had Nichols CT renal stone study done which showed:  severe right hydronephrosis and perinephric stranding, R renal mass, Cholelithiasis, Small right pleural effusion, Aortic atherosclerosis.  Was treated with IV antibiotics for R pyelonephritis/ Klebsiella P.-E-coli UTI.   Hospital course complicated by generalized weakness, worsening leukocytosis for which Nichols repeated UA and UCX were done.  UCx showed 30,000 colonies of [name]., pansensitive.  Was started on po Ciprofloxacin on 09/03/19.  Due to worsening leukocytosis and neutrophilia, Nichols CT scan abd pelvis was repeated on 09/04/19 which showed suspected rapidly progressing renal cancer.  Discussed findings with the patient's daughter [name] in person with her brother on the phone.  Also discussed with [name] of urology and [name] of Oncology.  Requested palliative care to assist with establishing goals of care.  Seen by oncology, urology and palliative care.  Plan discussed with family.  [name] will be discharged to SNF for rehab as requested by her family.  She will follow up with oncology outpatient and will be followed by palliative care outpatient.  09/06/19:  Seen and examined.  She denies any pain at the time of this exam.  She does not have any  complaints.  Afebrile overnight.  Leukocytosis and neutrophilia are trending down, on IV Zosyn.  Blood cultures are negative to date.  Will continue antibiotic Ciprofloxacin 500 mg BID x 7 days at [facility].  Added Florastor 250 mg BID x 7 days to maintain  gut flora.    Hospital Course:  Principal Problem:   Acute renal failure superimposed on stage 3b chronic kidney disease (HCC) Active Problems:   Hypertension associated with diabetes ([place])   CAD (coronary artery disease)   Chronic systolic CHF (congestive heart failure) (HCC)   Type 2 diabetes mellitus with stage 3 chronic kidney disease (HCC)   Hyperlipidemia associated with type 2 diabetes mellitus (HCC)   Normocytic anemia   Hypothyroidism   Hydronephrosis of right kidney   Hyponatremia   AKI (acute kidney injury) ([place])   Cancer of renal pelvis, right ([place])   Palliative care by specialist   Goals of care, counseling/discussion   Generalized pain   General weakness  Newly diagnosed right 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 Discussed findings with [name] and [name] Updated the patient's daughter in person and the patient's son via phone 4/1. Palliative care team saw the patient and assisted with establishing goals of care. Per oncology: "The plan at this point is to proceed with Nichols period of rehabilitation and assess her performance status in the next 2 weeks.  If she improves consideration for palliative therapy would be considered.  If she continues to decline that hospice would be the recommendation." Follow up with oncology outpatient and continue with palliative care outpatient.  Leukocytosis/neutrophilia, R pyelonephritis? Recently completed course of IV abx for Klebsiella P.-E-coli UTI. Repeated Ucx 09/02/19 showed 30,000 colonies of [name], pansensitive.  Was started on po Ciprofloxacin on 09/03/19, dced on 09/05/19 due to worsening labs.  Started Zosyn>> stopped on 09/06/19. Will continue:   Ciprofloxacin 500 mg BID x 7 days at [facility].  Added Florastor 250 mg BID x 7 days to maintain gut flora. WBC 14K>> 16K>>13K, neutrophil 11.9K>>14K>>11K Afebrile  B/L pleural effusions/Possible loculation R pleural effusion, from malignancy? Personally reviewed CT with findings as stated above Continue to maintain O2 sats >90% Currently 96% RA  AKI on CKD 3 likely secondary to obstructive malignancy Baseline creatinine appears to be 1.4 with GFR of 32 Presented with creatinine greater than 2.5 Cr trending down 1.87>> 1.9>> 1.82, GFR 24. Continue to avoid nephrotoxins  Resolved Hypovolemic hyponatremia Serum sodium 141> 143>> 144.  Resolved Hypomagnesemia Mg2+ 2.1> 2.0>> 2.0  Treated Klebsiella/E. coli UTI Completed course of antibiotics  Chronic systolic CHF Continue cardiac medications Hold off lasix and losartan due to recent AKI Follow up with cardiology and your PCP  Hypothyroidism Continue Synthroid  Anemia of chronic disease in the setting of CKD and malignancy Hemoglobin 8.6, MCV 87 Baseline Hg 10 No overt bleeding  Type 2 DM with hyperglycemia A1c 6.6 on 08/23/19 Continue ISS  HLD C/w statin  Physical debility PT OT rec SNF Continue PT OT with assistance and fall precaution Continue palliative care    Code Status:DNR   Consultants:Urology/palliative care/ Oncology  Procedures:Cystoscopy/ureteroscopy and right-sided stent placement on 08/27/2019 by urology  Antimicrobials:  Rocephin from 08/24/2019-08/27/2019 Keflex from 08/28/2019-08/30/2019 Cipro 09/03/2019>>      Discharge Exam: BP (!) 132/41 (BP Location: Right Arm)  Pulse 72   Temp (!) 97.5 F (36.4 C) (Oral)   Resp 20   Ht 5' (1.524 m)   Wt 63.8 kg   SpO2 96%   BMI 27.47 kg/m  . General: 86 y.o. year-old female well developed well nourished in no acute distress. . Cardiovascular: Regular rate and rhythm with no rubs or gallops.  No thyromegaly or JVD noted.    Marland Kitchen Respiratory: Clear to auscultation with no wheezes or rales. Good inspiratory effort. . Abdomen: Soft nontender nondistended with normal bowel sounds x4 quadrants. . Musculoskeletal: No lower extremity edema. 2/4 pulses in all 4 extremities. Marland Kitchen Psychiatry: Mood is appropriate for condition and setting  Discharge Instructions You were cared for by Nichols hospitalist during your hospital stay. If you have any questions about your discharge medications or the care you received while you were in the hospital after you are discharged, you can call the unit and asked to speak with the hospitalist on call if the hospitalist that took care of you is not available. Once you are discharged, your primary care physician will handle any further medical issues. Please note that NO REFILLS for any discharge medications will be authorized once you are discharged, as it is imperative that you return to your primary care physician (or establish Nichols relationship with Nichols primary care physician if you do not have one) for your aftercare needs so that they can reassess your need for medications and monitor your lab values.   Allergies as of 09/06/2019      Reactions   Amoxapine And Related Hives   Clozapine Hives      Medication List    STOP taking these medications   Aleve PM 220-25 MG Tabs Generic drug: Naproxen Sod-diphenhydrAMINE   cholecalciferol 1000 units tablet Commonly known as: VITAMIN D   furosemide 20 MG tablet Commonly known as: LASIX   HYDROcodone-acetaminophen 5-325 MG tablet Commonly known as: NORCO/VICODIN   linagliptin 5 MG Tabs tablet Commonly known as: TRADJENTA   losartan 100 MG tablet Commonly known as: COZAAR   metFORMIN 500 MG tablet Commonly known as: GLUCOPHAGE   solifenacin 5 MG tablet Commonly known as: VESICARE     TAKE these medications   acetaminophen 325 MG tablet Commonly known as: TYLENOL Take 2 tablets (650 mg total) by mouth every 6 (six) hours as needed for mild  pain (or Fever >/= 101).   carvedilol 6.25 MG tablet Commonly known as: COREG Take 1 tablet (6.25 mg total) by mouth 2 (two) times daily with Nichols meal.   ciprofloxacin 500 MG tablet Commonly known as: CIPRO Take 1 tablet (500 mg total) by mouth 2 (two) times daily for 7 days.   feeding supplement (ENSURE ENLIVE) Liqd Take 237 mLs by mouth 3 (three) times daily between meals for 10 days.   insulin aspart 100 UNIT/ML FlexPen Commonly known as: NOVOLOG Inject 1 Units into the skin 3 (three) times daily with meals. Hold insulin if she does not eat.   iron polysaccharides 150 MG capsule Commonly known as: NIFEREX Take 1 capsule (150 mg total) by mouth daily.   levothyroxine 50 MCG tablet Commonly known as: SYNTHROID Take 25 mcg by mouth daily before breakfast.   mirtazapine 15 MG tablet Commonly known as: REMERON Take 1 tablet (15 mg total) by mouth at bedtime.   morphine CONCENTRATE 10 MG/0.5ML Soln concentrated solution Take 0.25 mLs (5 mg total) by mouth every 3 (three) hours as needed for up to 3 days for severe  pain or shortness of breath.   omeprazole 20 MG capsule Commonly known as: PRILOSEC Take 20 mg by mouth daily.   ondansetron 4 MG tablet Commonly known as: ZOFRAN Take 1 tablet (4 mg total) by mouth every 6 (six) hours as needed for nausea.   polyethylene glycol 17 g packet Commonly known as: MIRALAX / GLYCOLAX Take 17 g by mouth daily.   saccharomyces boulardii 250 MG capsule Commonly known as: Florastor Take 1 capsule (250 mg total) by mouth 2 (two) times daily for 7 days.   senna-docusate 8.6-50 MG tablet Commonly known as: Senokot-S Take 1 tablet by mouth at bedtime.   simvastatin 40 MG tablet Commonly known as: ZOCOR Take 40 mg by mouth every evening.   traMADol 50 MG tablet Commonly known as: ULTRAM Take 1 tablet (50 mg total) by mouth every 12 (twelve) hours as needed for up to 5 days for moderate pain or severe pain (Hold & Call MD if SBP<90,  HR<65, RR<10, O2<90, or altered mental status.). What changed:   when to take this  reasons to take this            Durable Medical Equipment  (From admission, onward)         Start     Ordered   09/04/19 0634  For home use only DME 3 n 1  Once     09/04/19 [id number]   09/04/19 [id number]  For home use only DME Hospital bed  Once    Question Answer Comment  Length of Need 12 Months   The above medical condition requires: Patient requires the ability to reposition frequently   Head must be elevated greater than: 30 degrees   Bed type Semi-electric      09/04/19 [id number]   09/04/19 [id number]  For home use only DME standard manual wheelchair with seat cushion  Once    Comments: Patient suffers from ambulatory dysfunction which impairs their ability to perform daily activities like walking in the home.  Nichols walker will not resolve issue with performing activities of daily living. Nichols wheelchair will allow patient to safely perform daily activities. Patient can safely propel the wheelchair in the home or has Nichols caregiver who can provide assistance. Length of need 6 months. Accessories: elevating leg rests, wheel locks, extensions and anti-tippers.   09/04/19 [id number]         Allergies  Allergen Reactions  . Amoxapine And Related Hives  . Clozapine Hives   Follow-up Information    [name], MD On 09/15/2019.   Specialty: Urology Why: at 10 AM for MD visit and pathology reveiw.  Contact information: [place] [address] [phone]        [place], [place], [place]. Schedule an appointment as soon as possible for Nichols visit in 1 week(s).   Specialty: Internal Medicine Why: with repeat cbc/bmp Contact information: [place] [address] [phone]        palliative care Follow up.   Why: at earliest convenience       Shadad, Mathis Dad, MD. Call in 1 day(s).   Specialty: Oncology Why: Please call for Nichols post hospital follow up  appointment. Contact information: [place] [address] [phone]        Josue Hector, MD. Call in 1 day(s).   Specialty: Cardiology Why: Please call for Nichols post hospital follow up appointment. Contact information: [id number] [place] [address]  [phone]            The results of significant diagnostics from this hospitalization (including imaging, microbiology, ancillary and laboratory) are listed below for reference.    Significant Diagnostic Studies: CT ABDOMEN PELVIS WO CONTRAST  Result Date: 09/04/2019 CLINICAL DATA:  Urothelial cancer of the right renal pelvis. Pyelonephritis. Stent placement. EXAM: CT ABDOMEN AND PELVIS WITHOUT CONTRAST TECHNIQUE: Multidetector CT imaging of the abdomen and pelvis was performed following the standard protocol without IV contrast. COMPARISON:  08/23/2019 FINDINGS: Despite efforts by the technologist and patient, motion artifact is present on today's exam and could not be eliminated. This reduces exam sensitivity and specificity. Lower chest: Increased size and potential loculation of the right pleural effusion. New small left pleural effusion. Abnormal fluid density in the retrosternal region on top most images such as image 1/2, significance uncertain. There is atelectasis of most of the right lower lobe with air bronchograms. Atherosclerotic calcification of thoracic aorta branch vessels. Low-density blood pool suggests anemia. Stable calcification of the pericardium along the cardiac apex suggesting prior calcific pericarditis. Hepatobiliary: Dependent density in the gallbladder likely from small settling gallstones. Focal fatty infiltration along the falciform ligament. Pancreas: Unremarkable Spleen: Unremarkable Adrenals/Urinary Tract: Both adrenal glands appear normal. The left kidney appears normal. Expanded right kidney with soft tissue density along the renal pelvis probably  representing Nichols combination of tumor and blood products given the increase in prominence over the last 12 days. Nichols right ureteral stent is present proximal loop formed in the right kidney upper pole collecting system and distal loop in the urinary bladder. Suspected 0.4 cm right kidney lower pole nonobstructive renal calculus on image 56/2. Accentuated density in the right ureter around the stent, likely from blood products although Nichols component of tumor is difficult to exclude. Mildly distended urinary bladder. Small amount of gas in the urinary bladder is likely from recent instrumentation. Worsening right perirenal stranding is observed. Stomach/Bowel: Stranding and indistinctness of tissue planes along the descending and proximal transverse duodenum, new inflammation in this vicinity is not excluded. Prominent stool throughout the colon favors constipation. Prominence of stool in the rectal vault with surrounding stranding in the margins of the perirectal space, early stercoral colitis is difficult to exclude. Sigmoid colon diverticulosis. Vascular/Lymphatic: Aortoiliac atherosclerotic vascular disease. Retrocaval node 1.3 cm in short axis on image 40/2, previously 0.9 cm. Right common iliac node 1.0 cm in short axis on image 61/2, previously same. Possible indistinct adenopathy in the vicinity of the bifurcation of the right common iliac artery, with the adjacent density of the right ureter noted. Tumor nodule or adenopathy adjacent to the right psoas muscle and near the external iliac vessels measures 2.4 by 1.5 cm on image 69/2, previously 2.3 by 1.3 cm. Reproductive: Presumed dystrophic calcifications along the introitus. The uterus is absent. Other: No supplemental non-categorized findings. Musculoskeletal: Expansile lytic and destructive 3.6 by 2.0 cm lesion in the right iliac bone on image 63/2, rapidly progressive given that the lesion is not easily appreciable on the prior CT of the right hip from  08/01/2019. Fracture along the inferior endplate of L1 as shown on prior lumbar spine from 08/01/2019. New lytic expansile lesion in the posterior vertebral body at T12 causing Nichols posterior bulge in the vertebral contour on image 62/6 compatible with metastatic lesion. There is Nichols pathologic vertical fracture extending through the inferior and superior endplates along with associated endplate compression as shown on image 63/6. Enlarging sclerotic lesion in the L3 vertebral body  measuring 1.3 cm in diameter on image 63/6, previously 0.8 by 0.4 cm on 08/01/2019, favoring Nichols metastatic lesion. Bilateral pars defects at L5 appear chronic with grade 1 anterolisthesis. Lumbar spondylosis and degenerative disc disease cause multilevel impingement in the lumbar spine. IMPRESSION: 1. Enlarging soft tissue density along the right renal pelvis and right ureter, probably Nichols combination of tumor and blood products given the increase in prominence over the last 12 days. Superimposed pyelonephritis is not excluded and there is increase in perirenal stranding on the right. Nichols right ureteral stent is in place. 2. Rapidly progressive osseous metastatic lesions including Nichols lytic and destructive 3.6 cm lesion of the right iliac bone; lytic expansile lesion posteriorly in the T12 vertebral body with associated pathologic fracture; and an enlarging sclerotic lesion in the L3 vertebral body. Stable inferior endplate fracture L1, indeterminate for malignant etiology. 3. Worsening retroperitoneal and right pelvic adenopathy. 4. New stranding and indistinctness of tissue planes along the descending and proximal transverse duodenum, potentially from duodenitis. 5. Enlarging right pleural effusion with potential loculation. New small left pleural effusion. 6. Prominent stool throughout the colon favors constipation. Prominence of stool in the rectal vault with surrounding stranding in the margins of the perirectal space, early stercoral colitis is  difficult to exclude. 7. Other imaging findings of potential clinical significance: Low-density blood pool suggests anemia. Stable calcification of the pericardium along the cardiac apex suggesting prior calcific pericarditis. 0.4 cm right kidney lower pole nonobstructive renal calculus. Cholelithiasis. Sigmoid colon diverticulosis. Lumbar spondylosis and degenerative disc disease causing multilevel impingement. Chronic bilateral pars defects L5. Aortic Atherosclerosis (ICD10-I70.0). Electronically Signed   By: Van Clines M.D.   On: 09/04/2019 08:45   US RENAL  Result Date: 08/24/2019 CLINICAL DATA:  Chronic kidney disease, acute renal failure EXAM: RENAL / URINARY TRACT ULTRASOUND COMPLETE COMPARISON:  CT 08/23/2019 FINDINGS: Right Kidney: Renal measurements: 11.4 x 5.7 x 6.9 cm = volume: 235 mL. Moderate right hydronephrosis as seen on CT. Normal echotexture. No visible renal mass. Left Kidney: Renal measurements: 7.9 x 4.3 x 2.3 cm = volume: 92 mL. Echogenicity within normal limits. No mass or hydronephrosis visualized. Bladder: Appears normal for degree of bladder distention. Other: None. IMPRESSION: Moderate right hydronephrosis.  No visible renal mass. Electronically Signed   By: Rolm Baptise M.D.   On: 08/24/2019 00:51   DG C-Arm 1-60 Min-No Report  Result Date: 08/27/2019 Fluoroscopy was utilized by the requesting physician.  No radiographic interpretation.   CT Renal Stone Study  Result Date: 08/23/2019 CLINICAL DATA:  Abdominal pain, weakness EXAM: CT ABDOMEN AND PELVIS WITHOUT CONTRAST TECHNIQUE: Multidetector CT imaging of the abdomen and pelvis was performed following the standard protocol without IV contrast. COMPARISON:  08/08/2019 FINDINGS: Lower chest: Small right pleural effusion, decreased since prior study. Heart is normal size. Calcifications at Nichols apex likely related to old infarct. Hepatobiliary: Layering gallstones within the gallbladder. No focal hepatic abnormality.  Pancreas: No focal abnormality or ductal dilatation. Spleen: No focal abnormality.  Normal size. Adrenals/Urinary Tract: Severe right hydronephrosis with perinephric stranding. The right ureter is difficult to follow at the pelvic brim, but is decompressed distally. Soft tissue fullness in the region of the retroperitoneum at the pelvic brim. Cannot exclude Peri ureteral mass/neoplasm or stricture. The appearance is similar to prior study. Again, soft tissue fullness in the central right kidney, cannot exclude Nichols midpole pararenal soft tissue mass, difficult to evaluate on this noncontrast study. No hydronephrosis on the left. Adrenal glands and urinary bladder  unremarkable. Stomach/Bowel: Sigmoid diverticulosis. No active diverticulitis. No evidence of bowel obstruction. Moderate stool burden. Vascular/Lymphatic: Aortic atherosclerosis. No enlarged abdominal or pelvic lymph nodes. Reproductive: Prior hysterectomy.  No adnexal masses. Other: No free fluid or free air. Musculoskeletal: No acute bony abnormality. IMPRESSION: Again noted is severe right hydronephrosis and perinephric stranding. The right ureter is dilated to the pelvic brim where there is increased soft tissue. Cannot exclude ureteral or peri ureteral mass/neoplasm or stricture. This has Nichols similar appearance to recent [facility] study. Soft tissue fullness in the midpole of the right kidney. Cannot exclude mass. This cannot be characterized or evaluated on this noncontrast study. Cholelithiasis. Small right pleural effusion, decreased since prior study. Aortic atherosclerosis. Sigmoid diverticulosis. Electronically Signed   By: Rolm Baptise M.D.   On: 08/23/2019 22:30    Microbiology: Recent Results (from the past 240 hour(s))  SARS CORONAVIRUS 2 (TAT 6-24 HRS) Nasopharyngeal Nasopharyngeal Swab     Status: None   Collection Time: 09/01/19 10:04 AM   Specimen: Nasopharyngeal Swab  Result Value Ref Range Status   SARS Coronavirus 2  NEGATIVE NEGATIVE Final    Comment: (NOTE) SARS-CoV-2 target nucleic acids are NOT DETECTED. The SARS-CoV-2 RNA is generally detectable in upper and lower respiratory specimens during the acute phase of infection. Negative results do not preclude SARS-CoV-2 infection, do not rule out co-infections with other pathogens, and should not be used as the sole basis for treatment or other patient management decisions. Negative results must be combined with clinical observations, patient history, and epidemiological information. The expected result is Negative. Fact Sheet for Patients: SugarRoll.be Fact Sheet for Healthcare Providers: https://www.woods-mathews.com/ This test is not yet approved or cleared by the [place] FDA and  has been authorized for detection and/or diagnosis of SARS-CoV-2 by FDA under an Emergency Use Authorization (EUA). This EUA will remain  in effect (meaning this test can be used) for the duration of the COVID-19 declaration under Section 56 4(b)(1) of the Act, 21 U.S.C. section 360bbb-3(b)(1), unless the authorization is terminated or revoked sooner. Performed at [facility], [place] [address], [place], [place] [address]   Culture, blood (routine x 2)     Status: None (Preliminary result)   Collection Time: 09/02/19 10:04 AM   Specimen: BLOOD  Result Value Ref Range Status   Specimen Description   Final    BLOOD LEFT ARM Performed at [facility] [address], [place], [place] [address]    Special Requests   Final    BOTTLES DRAWN AEROBIC ONLY Blood Culture results may not be optimal due to an inadequate volume of blood received in culture bottles Performed at [facility] [address] [place], [place] [address]    Culture   Final    NO GROWTH 3 DAYS Performed at [facility], [place] [address], [place], [place] [address]    Report Status PENDING  Incomplete   Culture, blood (routine x 2)     Status: None (Preliminary result)   Collection Time: 09/02/19 10:07 AM   Specimen: BLOOD  Result Value Ref Range Status   Specimen Description   Final    BLOOD RIGHT ANTECUBITAL Performed at [facility] [address] [place], [place] [address]    Special Requests   Final    BOTTLES DRAWN AEROBIC AND ANAEROBIC Blood Culture adequate volume Performed at [facility] [address] [place], [place] [address]    Culture  Final    NO GROWTH 3 DAYS Performed at [facility], [place] [address] [place], [place] [address]    Report Status PENDING  Incomplete  Culture, Urine     Status: Abnormal   Collection Time: 09/02/19  4:33 PM   Specimen: Urine, Clean Catch  Result Value Ref Range Status   Specimen Description   Final    URINE, CLEAN CATCH Performed at [facility], [place] [address], [place], [place] [address]    Special Requests   Final    NONE Performed at [facility], [place] [address], [place], [place] [address]    Culture 30,000 COLONIES/mL Misty AERUGINOSA (Nichols)  Final   Report Status 09/04/2019 FINAL  Final   Organism ID, Bacteria Misty AERUGINOSA ([place]ichols)  Final      Susceptibility   Misty aeruginosa - MIC*    CEFTAZIDIME 2 SENSITIVE Sensitive     CIPROFLOXACIN <=0.25 SENSITIVE Sensitive     GENTAMICIN <=1 SENSITIVE Sensitive     IMIPENEM 2 SENSITIVE Sensitive     PIP/TAZO <=4 SENSITIVE Sensitive     CEFEPIME 2 SENSITIVE Sensitive     * 30,000 COLONIES/mL Misty AERUGINOSA  SARS CORONAVIRUS 2 (TAT 6-24 HRS) Nasopharyngeal Nasopharyngeal Swab     Status: None   Collection Time: 09/05/19  6:40 PM   Specimen: Nasopharyngeal Swab  Result Value Ref Range Status   SARS Coronavirus 2 NEGATIVE NEGATIVE Final    Comment: (NOTE) SARS-CoV-2 target nucleic acids are NOT DETECTED. The SARS-CoV-2 RNA is generally detectable in upper and  lower respiratory specimens during the acute phase of infection. Negative results do not preclude SARS-CoV-2 infection, do not rule out co-infections with other pathogens, and should not be used as the sole basis for treatment or other patient management decisions. Negative results must be combined with clinical observations, patient history, and epidemiological information. The expected result is Negative. Fact Sheet for Patients: SugarRoll.be Fact Sheet for Healthcare Providers: https://www.woods-mathews.com/ This test is not yet approved or cleared by the [place] FDA and  has been authorized for detection and/or diagnosis of SARS-CoV-2 by FDA under an Emergency Use Authorization (EUA). This EUA will remain  in effect (meaning this test can be used) for the duration of the COVID-19 declaration under Section 56 4(b)(1) of the Act, 21 U.S.C. section 360bbb-3(b)(1), unless the authorization is terminated or revoked sooner. Performed at [facility], [place] [address] [place],  [address]      Labs: Basic Metabolic Panel: Recent Labs  Lab 08/31/19 0531 08/31/19 0531 09/02/19 [id number] 09/03/19 0529 09/04/19 0454 09/05/19 0657 09/06/19 0519  NA 138   < > 138 141 141 143 144  K 4.0   < > 4.1 4.3 4.3 4.4 4.8  CL 103   < > 103 106 107 106 108  CO2 25   < > 27 26 26 26 26   GLUCOSE 136*   < > 173* 163* 206* 192* 182*  BUN 36*   < > 41* 44* 48* 52* 48*  CREATININE 1.74*   < > 1.87* 1.95* 1.95* 1.93* 1.82*  CALCIUM 9.2   < > 9.4 9.6 9.4 9.8 9.8  MG 1.7  --  2.1  --   --  2.0 2.0  PHOS  --   --  3.1  --   --  3.2 3.8   < > = values in this interval not displayed.   Liver Function Tests: Recent Labs  Lab 09/02/19  [id number] 09/05/19 0657  AST 16 19  ALT 16 13  ALKPHOS 116 114  BILITOT 0.6 0.4  PROT 5.9* 6.1*  ALBUMIN 2.6* 2.7*   No results for input(s): LIPASE, AMYLASE in the last 168 hours. No results for input(s): AMMONIA  in the last 168 hours. CBC: Recent Labs  Lab 09/02/19 0635 09/03/19 0529 09/04/19 0454 09/05/19 0657 09/06/19 0519  WBC 14.8* 13.7* 14.1* 16.3* 13.7*  NEUTROABS 12.6* 11.1* 11.9* 14.0* 11.5*  HGB 8.9* 8.9* 8.5* 9.1* 8.6*  HCT 28.9* 28.8* 28.0* 29.2* 28.8*  MCV 85.8 87.0 87.0 86.9 87.8  PLT 329 331 320 352 333   Cardiac Enzymes: No results for input(s): CKTOTAL, CKMB, CKMBINDEX, TROPONINI in the last 168 hours. BNP: BNP (last 3 results) No results for input(s): BNP in the last 8760 hours.  ProBNP (last 3 results) No results for input(s): PROBNP in the last 8760 hours.  CBG: Recent Labs  Lab 09/05/19 1217 09/05/19 1701 09/05/19 2045 09/06/19 0736 09/06/19 1231  GLUCAP 190* 145* 185* 141* 157*       Signed:  Kayleen Memos, MD [facility] 09/06/2019, 12:55 PM

## 2019-09-06 NOTE — Progress Notes (Signed)
Report was called to Claris Che, Therapist, sports at [facility]. [facility] was called for transportation. Will continue to monitor.

## 2019-09-06 NOTE — TOC Transition Note (Addendum)
Transition of Care [facility]) - CM/SW Discharge Note   Patient Details  Name: [name] MRN: [id number] Date of Birth: 1924-09-03  Transition of [facility]) CM/SW Contact:  Ross Ludwig, LCSW Phone Number: 09/06/2019, 2:47 PM   Clinical Narrative:     Patient to be d/c'ed today to [facility].  Patient and family agreeable to plans will transport via ems RN to call report to [phone].  CSW spoke to patient's daughter [name], [phone] and she is aware that patient is discharging today.  Patient's daughter would like a phone call once EMS arrives so she is aware that patient is on her way to SNF.  CSW was informed that palliative is recommended to follow at SNF, CSW made referral to Raina Mina at [facility] and are aware of the referral.  Final next level of care: Skilled Nursing Facility Barriers to Discharge: Barriers Resolved   Patient Goals and CMS Choice Patient states their goals for this hospitalization and ongoing recovery are:: To go to SNF for short term rehab, then return back home. CMS Medicare.gov Compare Post Acute Care list provided to:: Patient Represenative (must comment) Choice offered to / list presented to : Adult Children  Discharge Placement PASRR number recieved: 08/29/19            Patient chooses bed at: Other - please specify in the comment section below:( [facility]) Patient to be transferred to facility by: [facility] EMS Name of family member notified: Daughter [name] [phone] Patient and family notified of of transfer: 09/06/19  Discharge Plan and Services     Post Acute Care Choice: [facility]          DME Arranged: N/A DME Agency: NA       HH Arranged: NA          Social Determinants of Health (SDOH) Interventions     Readmission Risk Interventions Readmission Risk Prevention Plan 08/29/2019  Transportation Screening Complete  Social Work Consult for [place] Planning/Counseling  [place] Screening Complete  Medication Review Press photographer) Complete  Some recent data might be hidden

## 2019-09-07 LAB — CULTURE, BLOOD (ROUTINE X 2)
Culture: NO GROWTH
Culture: NO GROWTH
Special Requests: ADEQUATE

## 2019-09-08 ENCOUNTER — Other Ambulatory Visit: Payer: Self-pay

## 2019-09-08 ENCOUNTER — Non-Acute Institutional Stay: Payer: Medicare HMO | Admitting: Internal Medicine

## 2019-09-08 DIAGNOSIS — R531 Weakness: Secondary | ICD-10-CM | POA: Diagnosis not present

## 2019-09-08 DIAGNOSIS — C641 Malignant neoplasm of right kidney, except renal pelvis: Secondary | ICD-10-CM | POA: Diagnosis not present

## 2019-09-08 DIAGNOSIS — E1122 Type 2 diabetes mellitus with diabetic chronic kidney disease: Secondary | ICD-10-CM | POA: Diagnosis not present

## 2019-09-08 DIAGNOSIS — Z515 Encounter for palliative care: Secondary | ICD-10-CM | POA: Diagnosis not present

## 2019-09-08 DIAGNOSIS — I5022 Chronic systolic (congestive) heart failure: Secondary | ICD-10-CM | POA: Diagnosis not present

## 2019-09-08 DIAGNOSIS — N1832 Chronic kidney disease, stage 3b: Secondary | ICD-10-CM | POA: Diagnosis not present

## 2019-09-09 ENCOUNTER — Telehealth: Payer: Self-pay | Admitting: Oncology

## 2019-09-09 NOTE — Telephone Encounter (Signed)
A new pt appt has been scheduled for [name] to see Dr. Alen Blew on 4/16 at 2pm. Appt date and time has been given to the pt's daughter.

## 2019-09-10 ENCOUNTER — Non-Acute Institutional Stay: Payer: Medicare HMO | Admitting: Internal Medicine

## 2019-09-10 ENCOUNTER — Other Ambulatory Visit: Payer: Self-pay

## 2019-09-10 DIAGNOSIS — Z515 Encounter for palliative care: Secondary | ICD-10-CM

## 2019-09-10 DIAGNOSIS — C651 Malignant neoplasm of right renal pelvis: Secondary | ICD-10-CM

## 2019-09-11 DIAGNOSIS — C641 Malignant neoplasm of right kidney, except renal pelvis: Secondary | ICD-10-CM | POA: Diagnosis not present

## 2019-09-11 DIAGNOSIS — E1122 Type 2 diabetes mellitus with diabetic chronic kidney disease: Secondary | ICD-10-CM | POA: Diagnosis not present

## 2019-09-11 DIAGNOSIS — E890 Postprocedural hypothyroidism: Secondary | ICD-10-CM | POA: Diagnosis not present

## 2019-09-11 DIAGNOSIS — N1832 Chronic kidney disease, stage 3b: Secondary | ICD-10-CM | POA: Diagnosis not present

## 2019-09-15 DIAGNOSIS — N1832 Chronic kidney disease, stage 3b: Secondary | ICD-10-CM | POA: Diagnosis not present

## 2019-09-15 DIAGNOSIS — E1122 Type 2 diabetes mellitus with diabetic chronic kidney disease: Secondary | ICD-10-CM | POA: Diagnosis not present

## 2019-09-15 DIAGNOSIS — C641 Malignant neoplasm of right kidney, except renal pelvis: Secondary | ICD-10-CM | POA: Diagnosis not present

## 2019-09-15 DIAGNOSIS — I5022 Chronic systolic (congestive) heart failure: Secondary | ICD-10-CM | POA: Diagnosis not present

## 2019-09-17 ENCOUNTER — Non-Acute Institutional Stay: Payer: Medicare HMO | Admitting: Internal Medicine

## 2019-09-17 ENCOUNTER — Other Ambulatory Visit: Payer: Self-pay

## 2019-09-17 DIAGNOSIS — Z515 Encounter for palliative care: Secondary | ICD-10-CM

## 2019-09-17 DIAGNOSIS — C651 Malignant neoplasm of right renal pelvis: Secondary | ICD-10-CM

## 2019-09-19 ENCOUNTER — Telehealth: Payer: Self-pay | Admitting: Podiatry

## 2019-09-19 ENCOUNTER — Inpatient Hospital Stay: Payer: Medicare HMO | Admitting: Oncology

## 2019-09-19 NOTE — Telephone Encounter (Signed)
Pt daughter called wanted to cancel appt and let Dr.Galaway know that she has cancer and she may not make it till Tuesday.

## 2019-09-20 ENCOUNTER — Telehealth: Payer: Self-pay | Admitting: Internal Medicine

## 2019-09-20 DIAGNOSIS — Z515 Encounter for palliative care: Secondary | ICD-10-CM

## 2019-09-20 DIAGNOSIS — E43 Unspecified severe protein-calorie malnutrition: Secondary | ICD-10-CM

## 2019-09-20 DIAGNOSIS — R5381 Other malaise: Secondary | ICD-10-CM | POA: Diagnosis not present

## 2019-09-20 NOTE — Telephone Encounter (Signed)
Joint telephone conversation with son and daughter related to patient status and goals.  Pt has continued to exhibit decline of her health with inability to increase nutritional intake, continued weight loss and increased weakness.  Son and daughter express that their desire for her is comfort measures only now and no return to the hospital.  Son states that he had cancelled the oncology consult appointment and realizes that she would not be able to tolerate any potential treatments due to her level of decline and weakness.  Both agree with transitioning patient to receive Hospice services as she is nearing end of life.  She will remain at [facility].  Dr. Gildardo Cranker and Harlon Flor, NP were notified as well and agree with Hospice eligibility.  Referral order received for Hospice admission at the completion of her skilled therapy days. [facility] referral center notified of pending fax order for Hospice.  Support given to family.  They thanked me for the call.

## 2019-09-20 NOTE — Progress Notes (Signed)
[facility] Consult Note Telephone: [phone]  Fax: [phone]  PATIENT NAME: [name] DOB: [birth year] MRN: [id number]  PRIMARY CARE PROVIDER:   Scheryl Marten, [place]  REFERRING PROVIDER: Harlon Flor, NP/ Dr. Elijio Miles  RESPONSIBLE PARTY:    Gypsy Decant- Coffey (daughter) and Ceniya Tavera (son)     RECOMMENDATIONS and PLAN:  Palliative Care Encounter Z51.5  1.  Advance Care Planning:  Code status remains DNR.  MOST form completed by son with selections of comfort care, no return to the hospital, no tube feedings.  Phone conversation with son and daughter who report that they do no plan on any escalation of therapies and are in agreement with recommendations for transitions Hospice care after completion of skill therapy treatments.    2.  Weakness:  Multifactorial as related to advanced terminal disease with poor nutrition and dehydration.  Patient appears to be nearing end of life.  Supportive care.  Transition to Hospice care for comfort.   I spent 60 minutes providing this consultation,  from 1230 to 1330. More than 50% of the time in this consultation was spent coordinating communication with clinical staff, son and daughter.   HISTORY OF PRESENT ILLNESS:  Follow-up with  [name].  Staff reports that she continues to eat only occasional bites of food. She is unable to drink beverages.  Fluids via oral sponge.  Unable to take oral medications.   Palliative Care was asked to help address goals of care.   CODE STATUS: DNAR/DNI  PPS: 20% HOSPICE ELIGIBILITY/DIAGNOSIS: YES/ Cancer of renal pelvis with no plans for further evaluation or treatment; protein calorie malnutrition  PAST MEDICAL HISTORY:  Past Medical History:  Diagnosis Date  . Acute MI, anterior wall ([place])    a. 07/07/2011  . CAD (coronary artery disease)    a. 07/07/2011 anterior MI- Occluded LAD w/ emergent CABG x 2 (SVG->LAD->D1).  . 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 Radiation initiated 09/2011  . Hemorrhoid   . History of radiation therapy 09/05/11-10/12/11   thyoma  . HTN (hypertension)   . Hypothyroidism   . Ischemic cardiomyopathy    a. 07/17/11 Echo: EF 35%;  b. 09/19/2011 Echo: EF 30-35%, Mid-Dist AntSept & Apical AK, mild MR.  Small residual VSD.  Marland Kitchen Osteoarthritis of knee    Bilateral.  a. s/p LTKA ~2007;  b. s/p RTKA 10/2010  . Seasonal allergies   . Shortness of breath    recently SOB  . VSD (ventricular septal defect)    a.  07/07/2011 - Noted @ time of MI - s/p repair @ time of CABG;  b. 09/19/2011 Echo: small residula VSD (present on 2/11 echo also)       PERTINENT MEDICATIONS:  Outpatient Encounter Medications as of 09/17/2019  Medication Sig  . acetaminophen (TYLENOL) 325 MG tablet Take 2 tablets (650 mg total) by mouth every 6 (six) hours as needed for mild pain (or Fever >/= 101).  . carvedilol (COREG) 6.25 MG tablet Take 1 tablet (6.25 mg total) by mouth 2 (two) times daily with a meal.  . insulin aspart (NOVOLOG) 100 UNIT/ML FlexPen Inject 1 Units into the skin 3 (three) times daily with meals. Hold insulin if she does not eat.  . iron polysaccharides (NIFEREX) 150 MG capsule Take 1 capsule (150 mg total) by mouth daily. (Patient not taking: Reported on 08/19/2019)  . levothyroxine (SYNTHROID, LEVOTHROID) 50 MCG tablet  Take 25 mcg by mouth daily before breakfast.   . mirtazapine (REMERON) 15 MG tablet Take 1 tablet (15 mg total) by mouth at bedtime.  Marland Kitchen omeprazole (PRILOSEC) 20 MG capsule Take 20 mg by mouth daily.   . ondansetron (ZOFRAN) 4 MG tablet Take 1 tablet (4 mg total) by mouth every 6 (six) hours as needed for nausea.  . polyethylene glycol (MIRALAX / GLYCOLAX) 17 g packet Take 17 g by mouth daily. (Patient not taking: Reported on 08/19/2019)  . senna-docusate (SENOKOT-S) 8.6-50 MG tablet Take 1 tablet by mouth at bedtime. (Patient not taking: Reported on 08/19/2019)  .  simvastatin (ZOCOR) 40 MG tablet Take 40 mg by mouth every evening.   No facility-administered encounter medications on file as of 09/17/2019.    PHYSICAL EXAM:   General: Gravely ill and frail appearing, thin elderly female in geri chair Cardiovascular: regular rate and rhythm Pulmonary: Rales of the L base and clear otherwise Abdomen: soft, nontender, hypoactive bowel sounds GU: no suprapubic tenderness Extremities: no edema Skin: exposed skin is intact Neurological: Very somnolent but arouses to verbal stimuli.  Nonverbal,weakness   Gonzella Lex, NP-C

## 2019-09-22 ENCOUNTER — Ambulatory Visit: Payer: Medicare HMO | Admitting: Podiatry

## 2019-09-24 NOTE — Progress Notes (Deleted)
Cardiology Office Note    Date:  09/24/2019   ID:  CLARITY WI, DOB 05/18/1925, MRN [id number]  PCP:  Scheryl Marten, PA  Cardiologist: No primary care provider on file. EPS: None  No chief complaint on file.   History of Present Illness:  [name] is a 86 y.o. female with history of CAD status post anterior MI 07/07/11 with delayed presentation and VSD. She underwent CABG with SVG to D1 and LAD and VSD patch. EF 30-35% mild AR/MR echo 2015  Subsequent thymoma requiring surgery.   Last saw Dr. Johnsie Cancel 04/14/2019 and was doing well.   Patient was hospitalized earlier this month with worsening renal function after cystoscopy ureteroscopy with ureteral stent placement.  She had high-grade papillary urothelial feel carcinoma.  Patients health has declined and she has transitioned to hospice care.    Past Medical History:  Diagnosis Date  . Acute MI, anterior wall ([place])    a. 07/07/2011  . CAD (coronary artery disease)    a. 07/07/2011 anterior MI- Occluded LAD w/ emergent CABG x 2 (SVG->LAD->D1).  . 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 Radiation initiated 09/2011  . Hemorrhoid   . History of radiation therapy 09/05/11-10/12/11   thyoma  . HTN (hypertension)   . Hypothyroidism   . Ischemic cardiomyopathy    a. 07/17/11 Echo: EF 35%;  b. 09/19/2011 Echo: EF 30-35%, Mid-Dist AntSept & Apical AK, mild MR.  Small residual VSD.  Marland Kitchen Osteoarthritis of knee    Bilateral.  a. s/p LTKA ~2007;  b. s/p RTKA 10/2010  . Seasonal allergies   . Shortness of breath    recently SOB  . VSD (ventricular septal defect)    a.  07/07/2011 - Noted @ time of MI - s/p repair @ time of CABG;  b. 09/19/2011 Echo: small residula VSD (present on 2/11 echo also)    Past Surgical History:  Procedure Laterality Date  . ABDOMINAL HYSTERECTOMY    . CATARACT EXTRACTION, BILATERAL    . CORONARY ARTERY BYPASS GRAFT  07/07/2011   Procedure: CORONARY ARTERY BYPASS GRAFTING  (CABG);  Surgeon: Tharon Aquas Adelene Idler, MD;  Location: [facility];  Service: Open Heart Surgery;  Laterality: N/A;  times two using greater saphenous vein harvested via endovascular vein harvest  . CYSTOSCOPY/RETROGRADE/URETEROSCOPY Bilateral 08/27/2019   Procedure: CYSTOSCOPY, BILATERAL RETROGRADE, RIGHT URETEROSCOPY, RIGHT RENAL PELVIS BIOPSY, RIGHT URETERAL STENT PLACEMENT;  Surgeon: [name], MD;  Location: [facility];  Service: Urology;  Laterality: Bilateral;  75 MINS  . EYE SURGERY Bilateral    cataract  . HEMORRHOID SURGERY    . JOINT REPLACEMENT    . LEFT HEART CATHETERIZATION WITH CORONARY ANGIOGRAM N/A 07/07/2011   Procedure: LEFT HEART CATHETERIZATION WITH CORONARY ANGIOGRAM;  Surgeon: Josue Hector, MD;  Location: [facility] LAB;  Service: Cardiovascular;  Laterality: N/A;  . LUMBAR LAMINECTOMY/DECOMPRESSION MICRODISCECTOMY Left 08/19/2015   Procedure: Left Lumbar four-five Microdiskectomy;  Surgeon: Eustace Moore, MD;  Location: [facility];  Service: Neurosurgery;  Laterality: Left;  left  . MASS EXCISION  07/07/2011   Procedure: EXCISION MASS;  Surgeon: Tharon Aquas Adelene Idler, MD;  Location: [facility];  Service: Open Heart Surgery;  Laterality: N/A;  Excision of mediastinal mass  . MYRINGOTOMY WITH TUBE PLACEMENT Right 10/24/2012   Procedure: MYRINGOTOMY WITH TUBE PLACEMENT;  Surgeon: Melissa Montane, MD;  Location: [facility];  Service: ENT;  Laterality: Right;  . NASAL ENDOSCOPY WITH EPISTAXIS  CONTROL N/A 10/24/2012   Procedure: NASAL ENDOSCOPY WITH EPISTAXIS CONTROL;  Surgeon: Melissa Montane, MD;  Location: [facility];  Service: ENT;  Laterality: N/A;  nasal endoscopy without epistaxis control... only need 30 minutes   . RIGHT HEART CATHETERIZATION  07/07/2011   Procedure: RIGHT HEART CATH;  Surgeon: Josue Hector, MD;  Location: [facility] C[facility] LAB;  Service: Cardiovascular;;  . TONSILLECTOMY    . TOTAL KNEE ARTHROPLASTY     a.  Left ~ 2007, Right 10/2010  . VSD REPAIR  07/07/2011   Procedure: VENTRICULAR SEPTAL DEFECT  (VSD) REPAIR;  Surgeon: Len Childs, MD;  Location: [facility];  Service: Open Heart Surgery;  Laterality: N/A;  with bovine pericardium 4cm x 4cm    Current Medications: No outpatient medications have been marked as taking for the 09/30/19 encounter (Appointment) with Imogene Burn, PA-C.     Allergies:   Amoxapine and related and Clozapine   Social History   Socioeconomic History  . Marital status: Widowed    Spouse name: Not on file  . Number of children: 3  . Years of education: Not on file  . Highest education level: Not on file  Occupational History  . Occupation: Retired    Comment: office work  Tobacco Use  . Smoking status: Never Smoker  . Smokeless tobacco: Never Used  Substance and Sexual Activity  . Alcohol use: No  . Drug use: No  . Sexual activity: Not Currently  Other Topics Concern  . Not on file  Social History Narrative   Pt lives in [place] with her husband of 48 yrs.  Her husband has Parkinson's and she is his primary care giver.     Social Determinants of Health   Financial Resource Strain:   . Difficulty of Paying Living Expenses:   Food Insecurity:   . Worried About Charity fundraiser in the Last Year:   . Arboriculturist in the Last Year:   Transportation Needs:   . Film/video editor (Medical):   Marland Kitchen Lack of Transportation (Non-Medical):   Physical Activity:   . Days of Exercise per Week:   . Minutes of Exercise per Session:   Stress:   . Feeling of Stress :   Social Connections:   . Frequency of Communication with Friends and Family:   . Frequency of Social Gatherings with Friends and Family:   . Attends Religious Services:   . Active Member of Clubs or Organizations:   . Attends Archivist Meetings:   Marland Kitchen Marital Status:      Family History:  The patient's ***family history includes Heart disease in her mother.   ROS:   Please see the history of present illness.    ROS All other systems reviewed and are negative.    PHYSICAL EXAM:   VS:  There were no vitals taken for this visit.  Physical Exam  GEN: Well nourished, well developed, in no acute distress  HEENT: normal  Neck: no JVD, carotid bruits, or masses Cardiac:RRR; no murmurs, rubs, or gallops  Respiratory:  clear to auscultation bilaterally, normal work of breathing GI: soft, nontender, nondistended, + BS Ext: without cyanosis, clubbing, or edema, Good distal pulses bilaterally MS: no deformity or atrophy  Skin: warm and dry, no rash Neuro:  Alert and Oriented x 3, Strength and sensation are intact Psych: euthymic mood, full affect  Wt Readings from Last 3 Encounters:  09/02/19 140 lb 10.5 oz (63.8 kg)  08/21/19 129 lb (58.5 kg)  04/24/19 157 lb 3 oz (71.3 kg)      Studies/Labs Reviewed:   EKG:  EKG is*** ordered today.  The ekg ordered today demonstrates ***  Recent Labs: 09/05/2019: ALT 13 09/06/2019: BUN 48; Creatinine, Ser 1.82; Hemoglobin 8.6; Magnesium 2.0; Platelets 333; Potassium 4.8; Sodium 144   Lipid Panel    Component Value Date/Time   CHOL 44 07/08/2011 0345   TRIG 49 07/08/2011 0345   HDL 19 (L) 07/08/2011 0345   CHOLHDL 2.3 07/08/2011 0345   VLDL 10 07/08/2011 0345   LDLCALC 15 07/08/2011 0345    Additional studies/ records that were reviewed today include:  ***    ASSESSMENT:    No diagnosis found.   PLAN:  In order of problems listed above:  CAD status post anterior MI in 2013 with delayed presentation and VSD.  Status post CABG with SVG to the diagonal 1 and LAD and VSD patch.  EF 30 to 35% postop-no ICD because of age  Ischemic cardiomyopathy LVEF 30 to [id number]  Chronic systolic CHF  History of thymoma status post radiation    Medication Adjustments/Labs and Tests Ordered: Current medicines are reviewed at length with the patient today.  Concerns regarding medicines are outlined above.  Medication changes, Labs and Tests ordered today are listed in the Patient Instructions below. There are no  Patient Instructions on file for this visit.   Signed, Ermalinda Barrios, PA-C  09/24/2019 2:36 PM    [facility] [place], [place], [place]  [address] Phone: [phone]; Fax: [phone]

## 2019-09-30 ENCOUNTER — Ambulatory Visit: Payer: Medicare HMO | Admitting: Physician Assistant

## 2019-10-04 DIAGNOSIS — 419620001 Death: Secondary | SNOMED CT | POA: Diagnosis not present

## 2019-10-04 NOTE — Progress Notes (Signed)
[facility] Consult Note Telephone: [phone]  Fax: [phone]  PATIENT NAME: [name] DOB: 01-19-1925 MRN: [id number]  PRIMARY CARE PROVIDER:   Scheryl Marten, [place]  REFERRING PROVIDER:  Nicolette Bang, NP  RESPONSIBLE PARTY:   Ebbie Latus (daughter/POA)     RECOMMENDATIONS and PLAN:  Palliative care encounter  Z51.5  1.  Advance care planning:  DNAR in place.  Patient is unable to have conversation related to health care choices for her self.  Left a voice message for daughter to return call to this provider. Plan on discussion with daughter related to advance care planning.    2.  Cancer of renal pelvis with metastasis: Aggressive per medical record review.  Not a surgical candidate.  Pending oncology follow-up.  Poor outcome expected if patient does not improve nutritional intake or regain some strength.  Supportive care for now,  Attempt to improve hydration and nutrition.   I spent 30 minutes providing this consultation,  from 1600 to 1630. More than 50% of the time in this consultation was spent coordinating communication patient and clinical staff.   HISTORY OF PRESENT ILLNESS:  [name] is a 86 y.o. year old female with multiple medical problems including CAD and newly diagnosed cancer of the renal pelvis.  She was admitted to the hospital from 3/20-09/06/19 related to weakness, acute renal injury. Admitted to SNF for rehab.  Staff reports that she eats very few bites and sleeps for numerous hours.  She is non-ambulatory and requires total care for all ADLs.  Palliative Care was asked to help address goals of care.   CODE STATUS: DNAR  PPS: 30% HOSPICE ELIGIBILITY/DIAGNOSIS: TBD  PAST MEDICAL HISTORY:  Past Medical History:  Diagnosis Date  . Acute MI, anterior wall ([place])    a. 07/07/2011  . CAD (coronary artery disease)    a. 07/07/2011 anterior MI- Occluded LAD w/ emergent CABG x 2 (SVG->LAD->D1).  .  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 Radiation initiated 09/2011  . Hemorrhoid   . History of radiation therapy 09/05/11-10/12/11   thyoma  . HTN (hypertension)   . Hypothyroidism   . Ischemic cardiomyopathy    a. 07/17/11 Echo: EF 35%;  b. 09/19/2011 Echo: EF 30-35%, Mid-Dist AntSept & Apical AK, mild MR.  Small residual VSD.  Marland Kitchen Osteoarthritis of knee    Bilateral.  a. s/p LTKA ~2007;  b. s/p RTKA 10/2010  . Seasonal allergies   . Shortness of breath    recently SOB  . VSD (ventricular septal defect)    a.  07/07/2011 - Noted @ time of MI - s/p repair @ time of CABG;  b. 09/19/2011 Echo: small residula VSD (present on 2/11 echo also)        PERTINENT MEDICATIONS:  Outpatient Encounter Medications as of 09/08/2019  Medication Sig  . acetaminophen (TYLENOL) 325 MG tablet Take 2 tablets (650 mg total) by mouth every 6 (six) hours as needed for mild pain (or Fever >/= 101).  . carvedilol (COREG) 6.25 MG tablet Take 1 tablet (6.25 mg total) by mouth 2 (two) times daily with a meal.  . [EXPIRED] ciprofloxacin (CIPRO) 500 MG tablet Take 1 tablet (500 mg total) by mouth 2 (two) times daily for 7 days.  . [EXPIRED] feeding supplement, ENSURE ENLIVE, (ENSURE ENLIVE) LIQD Take 237 mLs by mouth 3 (three) times daily between meals for 10 days.  . insulin aspart (NOVOLOG)  100 UNIT/ML FlexPen Inject 1 Units into the skin 3 (three) times daily with meals. Hold insulin if she does not eat.  . iron polysaccharides (NIFEREX) 150 MG capsule Take 1 capsule (150 mg total) by mouth daily. (Patient not taking: Reported on 08/19/2019)  . levothyroxine (SYNTHROID, LEVOTHROID) 50 MCG tablet Take 25 mcg by mouth daily before breakfast.   . mirtazapine (REMERON) 15 MG tablet Take 1 tablet (15 mg total) by mouth at bedtime.  . [EXPIRED] Morphine Sulfate (MORPHINE CONCENTRATE) 10 MG/0.5ML SOLN concentrated solution Take 0.25 mLs (5 mg total) by mouth every 3 (three) hours as needed for up to  3 days for severe pain or shortness of breath.  Marland Kitchen omeprazole (PRILOSEC) 20 MG capsule Take 20 mg by mouth daily.   . ondansetron (ZOFRAN) 4 MG tablet Take 1 tablet (4 mg total) by mouth every 6 (six) hours as needed for nausea.  . polyethylene glycol (MIRALAX / GLYCOLAX) 17 g packet Take 17 g by mouth daily. (Patient not taking: Reported on 08/19/2019)  . [EXPIRED] saccharomyces boulardii (FLORASTOR) 250 MG capsule Take 1 capsule (250 mg total) by mouth 2 (two) times daily for 7 days.  Marland Kitchen senna-docusate (SENOKOT-S) 8.6-50 MG tablet Take 1 tablet by mouth at bedtime. (Patient not taking: Reported on 08/19/2019)  . simvastatin (ZOCOR) 40 MG tablet Take 40 mg by mouth every evening.  . [EXPIRED] traMADol (ULTRAM) 50 MG tablet Take 1 tablet (50 mg total) by mouth every 12 (twelve) hours as needed for up to 5 days for moderate pain or severe pain (Hold & Call MD if SBP<90, HR<65, RR<10, O2<90, or altered mental status.).   No facility-administered encounter medications on file as of 09/08/2019.    PHYSICAL EXAM:   General: NAD, frail appearing, thin elderly female reclining in bed. EENT:  Dry mucus membranes Cardiovascular: regular rate and rhythm Pulmonary: clear ant fields Abdomen: soft, nontender, + bowel sounds GU: no suprapubic tenderness Extremities: no edema Skin: no rashes Neurological: Weakness. Somnolent but arouses easily.  Oriented to self.   Gonzella Lex, NP-C

## 2019-10-04 NOTE — Progress Notes (Signed)
[facility] Consult Note Telephone: [phone]  Fax: [phone]  PATIENT NAME: [name] DOB: 20-Sep-1924 MRN: [id number]  PRIMARY CARE PROVIDER:   Scheryl Marten, [place]  REFERRING PROVIDER: Harlon Flor, NP/ Dr. Elijio Miles  RESPONSIBLE PARTY:  [name] Corsino-Coffey(daugher) and Shawnee Knapp)       RECOMMENDATIONS and PLAN:  Palliative care encounter Z51.5  1. Advance care planning:  DNAR on file for patient.  Unable to have detailed discussion with patient related to cognitive decline. Phone conversation with the daughter related to patient update, goals of care, and advanced directives.  She states that patient has a pending appointment with oncology in 1 week that she would still like patient to attend.  She desires for her to return to her pre-hospitalization state of increased mobility and independence.  It is unsure whether or not this is a realistic goal at this time.  MOST form was explained to daughter and copy left in patient's room for her review.  We will readdress advance directives during f/u conversation next week.   2.  Weakness:  New onset related to decline from cancer of renal pelvis, dehydration and protein calorie malnutrition.  Attempt protein supplements 2-3 times per day.  Aspiration risk precautions. Monitor  I spent 45 minutes providing this consultation,  from 1030 to 1115.. More than 50% of the time in this consultation was spent coordinating communication with patient, clinical staff and daughter.  HISTORY OF PRESENT ILLNESS:  [name] is a 86 y.o. year old female with multiple medical problems including CAD and renal pelvis cancer with mets.  Daughter states that she lived with her prior to recent hospitalization and was mobile with use of a walker, able to go on shopping trips, needed light assistance with ADLs.  She was continent and could feed self.  Palliative Care was asked to help address  goals of care.   CODE STATUS: DNAR  PPS: 20%  Sips and bites HOSPICE ELIGIBILITY/DIAGNOSIS: Renal pelvis cancer with metastasis.  Currently receiving therapy.  PAST MEDICAL HISTORY:  Past Medical History:  Diagnosis Date  . Acute MI, anterior wall ([place])    a. 07/07/2011  . CAD (coronary artery disease)    a. 07/07/2011 anterior MI- Occluded LAD w/ emergent CABG x 2 (SVG->LAD->D1).  . 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 Radiation initiated 09/2011  . Hemorrhoid   . History of radiation therapy 09/05/11-10/12/11   thyoma  . HTN (hypertension)   . Hypothyroidism   . Ischemic cardiomyopathy    a. 07/17/11 Echo: EF 35%;  b. 09/19/2011 Echo: EF 30-35%, Mid-Dist AntSept & Apical AK, mild MR.  Small residual VSD.  Marland Kitchen Osteoarthritis of knee    Bilateral.  a. s/p LTKA ~2007;  b. s/p RTKA 10/2010  . Seasonal allergies   . Shortness of breath    recently SOB  . VSD (ventricular septal defect)    a.  07/07/2011 - Noted @ time of MI - s/p repair @ time of CABG;  b. 09/19/2011 Echo: small residula VSD (present on 2/11 echo also)       PERTINENT MEDICATIONS:  Outpatient Encounter Medications as of 09/10/2019  Medication Sig  . acetaminophen (TYLENOL) 325 MG tablet Take 2 tablets (650 mg total) by mouth every 6 (six) hours as needed for mild pain (or Fever >/= 101).  . carvedilol (COREG) 6.25 MG tablet Take 1 tablet (6.25 mg total) by mouth 2 (  two) times daily with a meal.  . [EXPIRED] ciprofloxacin (CIPRO) 500 MG tablet Take 1 tablet (500 mg total) by mouth 2 (two) times daily for 7 days.  . [EXPIRED] feeding supplement, ENSURE ENLIVE, (ENSURE ENLIVE) LIQD Take 237 mLs by mouth 3 (three) times daily between meals for 10 days.  . insulin aspart (NOVOLOG) 100 UNIT/ML FlexPen Inject 1 Units into the skin 3 (three) times daily with meals. Hold insulin if she does not eat.  . iron polysaccharides (NIFEREX) 150 MG capsule Take 1 capsule (150 mg total) by mouth daily. (Patient  not taking: Reported on 08/19/2019)  . levothyroxine (SYNTHROID, LEVOTHROID) 50 MCG tablet Take 25 mcg by mouth daily before breakfast.   . mirtazapine (REMERON) 15 MG tablet Take 1 tablet (15 mg total) by mouth at bedtime.  Marland Kitchen omeprazole (PRILOSEC) 20 MG capsule Take 20 mg by mouth daily.   . ondansetron (ZOFRAN) 4 MG tablet Take 1 tablet (4 mg total) by mouth every 6 (six) hours as needed for nausea.  . polyethylene glycol (MIRALAX / GLYCOLAX) 17 g packet Take 17 g by mouth daily. (Patient not taking: Reported on 08/19/2019)  . [EXPIRED] saccharomyces boulardii (FLORASTOR) 250 MG capsule Take 1 capsule (250 mg total) by mouth 2 (two) times daily for 7 days.  Marland Kitchen senna-docusate (SENOKOT-S) 8.6-50 MG tablet Take 1 tablet by mouth at bedtime. (Patient not taking: Reported on 08/19/2019)  . simvastatin (ZOCOR) 40 MG tablet Take 40 mg by mouth every evening.  . [EXPIRED] traMADol (ULTRAM) 50 MG tablet Take 1 tablet (50 mg total) by mouth every 12 (twelve) hours as needed for up to 5 days for moderate pain or severe pain (Hold & Call MD if SBP<90, HR<65, RR<10, O2<90, or altered mental status.).   No facility-administered encounter medications on file as of 09/10/2019.    PHYSICAL EXAM:   General: gravely ill and  frail appearing, thin EENT:  Dry mucus membranes Cardiovascular: regular rate and rhythm Pulmonary: intermittent apenic respirations.  Lungs are clear Abdomen: soft, nontender, + bowel sounds GU: no suprapubic tenderness Extremities: no edema Skin: exposed skin is intact Neurological: somnolent  Gonzella Lex, NP-C

## 2019-10-04 DEATH — deceased
